# Patient Record
Sex: Male | Born: 1935 | Race: White | Hispanic: No | Marital: Married | Smoking: Former smoker
Health system: Southern US, Community
[De-identification: ages and names within clinical notes are randomized; demographics above are authoritative.]

## PROBLEM LIST (undated history)

## (undated) DIAGNOSIS — N390 Urinary tract infection, site not specified: Secondary | ICD-10-CM

## (undated) DIAGNOSIS — I341 Nonrheumatic mitral (valve) prolapse: Secondary | ICD-10-CM

## (undated) DIAGNOSIS — N183 Chronic kidney disease, stage 3 unspecified: Secondary | ICD-10-CM

## (undated) DIAGNOSIS — I10 Essential (primary) hypertension: Secondary | ICD-10-CM

## (undated) DIAGNOSIS — H548 Legal blindness, as defined in USA: Secondary | ICD-10-CM

## (undated) DIAGNOSIS — I5022 Chronic systolic (congestive) heart failure: Secondary | ICD-10-CM

## (undated) DIAGNOSIS — E119 Type 2 diabetes mellitus without complications: Secondary | ICD-10-CM

## (undated) DIAGNOSIS — J449 Chronic obstructive pulmonary disease, unspecified: Secondary | ICD-10-CM

## (undated) DIAGNOSIS — I639 Cerebral infarction, unspecified: Secondary | ICD-10-CM

## (undated) DIAGNOSIS — G459 Transient cerebral ischemic attack, unspecified: Secondary | ICD-10-CM

## (undated) DIAGNOSIS — E039 Hypothyroidism, unspecified: Secondary | ICD-10-CM

## (undated) DIAGNOSIS — I482 Chronic atrial fibrillation, unspecified: Secondary | ICD-10-CM

## (undated) DIAGNOSIS — G20 Parkinson's disease: Secondary | ICD-10-CM

## (undated) DIAGNOSIS — IMO0001 Reserved for inherently not codable concepts without codable children: Secondary | ICD-10-CM

## (undated) DIAGNOSIS — Z0389 Encounter for observation for other suspected diseases and conditions ruled out: Secondary | ICD-10-CM

## (undated) DIAGNOSIS — G20A1 Parkinson's disease without dyskinesia, without mention of fluctuations: Secondary | ICD-10-CM

## (undated) HISTORY — PX: CARDIAC SURGERY: SHX584

## (undated) HISTORY — PX: CHOLECYSTECTOMY: SHX55

---

## 2004-02-12 ENCOUNTER — Encounter: Payer: Self-pay | Admitting: Internal Medicine

## 2004-02-27 ENCOUNTER — Ambulatory Visit: Payer: Self-pay | Admitting: Anesthesiology

## 2004-03-05 ENCOUNTER — Encounter: Payer: Self-pay | Admitting: Internal Medicine

## 2004-04-04 ENCOUNTER — Encounter: Payer: Self-pay | Admitting: Internal Medicine

## 2004-05-07 ENCOUNTER — Encounter: Payer: Self-pay | Admitting: Internal Medicine

## 2004-05-20 ENCOUNTER — Ambulatory Visit: Payer: Self-pay | Admitting: Internal Medicine

## 2004-05-23 ENCOUNTER — Ambulatory Visit: Payer: Self-pay | Admitting: Internal Medicine

## 2004-06-03 ENCOUNTER — Ambulatory Visit: Payer: Self-pay | Admitting: Specialist

## 2004-06-05 ENCOUNTER — Encounter: Payer: Self-pay | Admitting: Internal Medicine

## 2004-07-03 ENCOUNTER — Encounter: Payer: Self-pay | Admitting: Internal Medicine

## 2004-07-11 ENCOUNTER — Ambulatory Visit: Payer: Self-pay | Admitting: Anesthesiology

## 2004-08-03 ENCOUNTER — Encounter: Payer: Self-pay | Admitting: Internal Medicine

## 2004-09-02 ENCOUNTER — Encounter: Payer: Self-pay | Admitting: Internal Medicine

## 2004-10-03 ENCOUNTER — Encounter: Payer: Self-pay | Admitting: Internal Medicine

## 2004-11-02 ENCOUNTER — Encounter: Payer: Self-pay | Admitting: Internal Medicine

## 2004-12-03 ENCOUNTER — Encounter: Payer: Self-pay | Admitting: Internal Medicine

## 2005-01-03 ENCOUNTER — Encounter: Payer: Self-pay | Admitting: Internal Medicine

## 2005-02-02 ENCOUNTER — Encounter: Payer: Self-pay | Admitting: Internal Medicine

## 2005-03-05 ENCOUNTER — Encounter: Payer: Self-pay | Admitting: Internal Medicine

## 2005-03-14 ENCOUNTER — Other Ambulatory Visit: Payer: Self-pay

## 2005-03-14 ENCOUNTER — Emergency Department: Payer: Self-pay | Admitting: Emergency Medicine

## 2005-03-16 ENCOUNTER — Other Ambulatory Visit: Payer: Self-pay

## 2005-03-16 ENCOUNTER — Inpatient Hospital Stay: Payer: Self-pay | Admitting: Urology

## 2005-04-04 ENCOUNTER — Encounter: Payer: Self-pay | Admitting: Internal Medicine

## 2005-05-05 ENCOUNTER — Encounter: Payer: Self-pay | Admitting: Internal Medicine

## 2005-06-05 ENCOUNTER — Encounter: Payer: Self-pay | Admitting: Internal Medicine

## 2005-07-03 ENCOUNTER — Encounter: Payer: Self-pay | Admitting: Internal Medicine

## 2005-08-03 ENCOUNTER — Encounter: Payer: Self-pay | Admitting: Internal Medicine

## 2005-09-05 ENCOUNTER — Ambulatory Visit: Payer: Self-pay | Admitting: Otolaryngology

## 2005-09-05 ENCOUNTER — Other Ambulatory Visit: Payer: Self-pay

## 2005-09-08 ENCOUNTER — Encounter: Payer: Self-pay | Admitting: Internal Medicine

## 2005-09-10 ENCOUNTER — Ambulatory Visit: Payer: Self-pay | Admitting: Otolaryngology

## 2005-10-07 ENCOUNTER — Encounter: Payer: Self-pay | Admitting: Internal Medicine

## 2005-10-16 ENCOUNTER — Ambulatory Visit: Payer: Self-pay | Admitting: Urology

## 2005-11-02 ENCOUNTER — Encounter: Payer: Self-pay | Admitting: Internal Medicine

## 2005-12-03 ENCOUNTER — Encounter: Payer: Self-pay | Admitting: Internal Medicine

## 2005-12-23 ENCOUNTER — Ambulatory Visit: Payer: Self-pay | Admitting: Anesthesiology

## 2006-01-01 ENCOUNTER — Ambulatory Visit: Payer: Self-pay | Admitting: Anesthesiology

## 2006-01-03 ENCOUNTER — Encounter: Payer: Self-pay | Admitting: Internal Medicine

## 2006-02-03 ENCOUNTER — Ambulatory Visit: Payer: Self-pay | Admitting: Anesthesiology

## 2006-02-24 ENCOUNTER — Encounter: Payer: Self-pay | Admitting: Internal Medicine

## 2006-02-25 ENCOUNTER — Ambulatory Visit: Payer: Self-pay | Admitting: Anesthesiology

## 2006-03-05 ENCOUNTER — Encounter: Payer: Self-pay | Admitting: Internal Medicine

## 2006-03-30 ENCOUNTER — Ambulatory Visit: Payer: Self-pay | Admitting: Anesthesiology

## 2006-04-04 ENCOUNTER — Encounter: Payer: Self-pay | Admitting: Internal Medicine

## 2006-05-18 ENCOUNTER — Encounter: Payer: Self-pay | Admitting: Internal Medicine

## 2006-05-28 ENCOUNTER — Ambulatory Visit: Payer: Self-pay | Admitting: Anesthesiology

## 2006-05-29 ENCOUNTER — Ambulatory Visit: Payer: Self-pay | Admitting: Urology

## 2006-05-29 ENCOUNTER — Other Ambulatory Visit: Payer: Self-pay

## 2006-06-05 ENCOUNTER — Encounter: Payer: Self-pay | Admitting: Internal Medicine

## 2006-06-10 ENCOUNTER — Ambulatory Visit: Payer: Self-pay | Admitting: Urology

## 2006-07-04 ENCOUNTER — Encounter: Payer: Self-pay | Admitting: Internal Medicine

## 2006-07-22 ENCOUNTER — Ambulatory Visit: Payer: Self-pay | Admitting: Anesthesiology

## 2006-08-04 ENCOUNTER — Encounter: Payer: Self-pay | Admitting: Internal Medicine

## 2006-09-07 ENCOUNTER — Encounter: Payer: Self-pay | Admitting: Internal Medicine

## 2006-09-17 ENCOUNTER — Ambulatory Visit: Payer: Self-pay | Admitting: Anesthesiology

## 2006-10-04 ENCOUNTER — Encounter: Payer: Self-pay | Admitting: Internal Medicine

## 2006-11-10 ENCOUNTER — Encounter: Payer: Self-pay | Admitting: Internal Medicine

## 2006-11-17 ENCOUNTER — Ambulatory Visit: Payer: Self-pay | Admitting: Anesthesiology

## 2006-12-04 ENCOUNTER — Encounter: Payer: Self-pay | Admitting: Internal Medicine

## 2007-01-04 ENCOUNTER — Encounter: Payer: Self-pay | Admitting: Internal Medicine

## 2007-01-20 ENCOUNTER — Ambulatory Visit: Payer: Self-pay | Admitting: Anesthesiology

## 2007-05-12 ENCOUNTER — Ambulatory Visit: Payer: Self-pay | Admitting: Anesthesiology

## 2007-07-06 ENCOUNTER — Ambulatory Visit: Payer: Self-pay | Admitting: Anesthesiology

## 2007-09-07 ENCOUNTER — Ambulatory Visit: Payer: Self-pay | Admitting: Anesthesiology

## 2007-10-30 ENCOUNTER — Emergency Department: Payer: Self-pay | Admitting: Unknown Physician Specialty

## 2007-11-18 ENCOUNTER — Ambulatory Visit: Payer: Self-pay | Admitting: Orthopedic Surgery

## 2007-12-21 ENCOUNTER — Ambulatory Visit: Payer: Self-pay | Admitting: Anesthesiology

## 2008-01-11 ENCOUNTER — Ambulatory Visit: Payer: Self-pay | Admitting: Anesthesiology

## 2008-02-23 ENCOUNTER — Ambulatory Visit: Payer: Self-pay | Admitting: Anesthesiology

## 2008-02-25 ENCOUNTER — Ambulatory Visit: Payer: Self-pay | Admitting: Urology

## 2008-03-22 ENCOUNTER — Ambulatory Visit: Payer: Self-pay | Admitting: Anesthesiology

## 2008-04-25 ENCOUNTER — Ambulatory Visit: Payer: Self-pay | Admitting: Anesthesiology

## 2008-05-11 ENCOUNTER — Encounter: Admission: RE | Admit: 2008-05-11 | Discharge: 2008-05-11 | Payer: Self-pay | Admitting: Neurology

## 2008-05-24 ENCOUNTER — Ambulatory Visit: Payer: Self-pay | Admitting: Anesthesiology

## 2008-06-28 ENCOUNTER — Ambulatory Visit: Payer: Self-pay | Admitting: Anesthesiology

## 2008-07-31 ENCOUNTER — Ambulatory Visit: Payer: Self-pay | Admitting: Anesthesiology

## 2008-09-08 ENCOUNTER — Ambulatory Visit: Payer: Self-pay | Admitting: Anesthesiology

## 2008-10-18 ENCOUNTER — Ambulatory Visit: Payer: Self-pay | Admitting: Anesthesiology

## 2008-12-11 ENCOUNTER — Ambulatory Visit: Payer: Self-pay | Admitting: Anesthesiology

## 2008-12-19 ENCOUNTER — Ambulatory Visit: Payer: Self-pay | Admitting: Urology

## 2009-02-15 ENCOUNTER — Ambulatory Visit: Payer: Self-pay | Admitting: Anesthesiology

## 2009-03-16 ENCOUNTER — Ambulatory Visit: Payer: Self-pay | Admitting: Anesthesiology

## 2009-04-04 ENCOUNTER — Ambulatory Visit: Payer: Self-pay | Admitting: Anesthesiology

## 2009-06-05 ENCOUNTER — Ambulatory Visit: Payer: Self-pay | Admitting: Anesthesiology

## 2009-07-09 ENCOUNTER — Ambulatory Visit: Payer: Self-pay | Admitting: Anesthesiology

## 2009-09-11 ENCOUNTER — Ambulatory Visit: Payer: Self-pay | Admitting: Anesthesiology

## 2009-11-29 ENCOUNTER — Ambulatory Visit: Payer: Self-pay | Admitting: Anesthesiology

## 2010-01-16 ENCOUNTER — Ambulatory Visit: Payer: Self-pay | Admitting: Anesthesiology

## 2010-01-29 ENCOUNTER — Ambulatory Visit: Payer: Self-pay | Admitting: Urology

## 2010-02-12 ENCOUNTER — Ambulatory Visit: Payer: Self-pay | Admitting: Urology

## 2010-02-26 ENCOUNTER — Ambulatory Visit: Payer: Self-pay | Admitting: Urology

## 2010-03-11 ENCOUNTER — Ambulatory Visit: Payer: Self-pay | Admitting: Ophthalmology

## 2010-03-26 ENCOUNTER — Ambulatory Visit: Payer: Self-pay | Admitting: Anesthesiology

## 2010-06-26 ENCOUNTER — Ambulatory Visit: Payer: Self-pay | Admitting: Anesthesiology

## 2010-08-26 ENCOUNTER — Ambulatory Visit: Payer: Self-pay | Admitting: Anesthesiology

## 2010-10-28 ENCOUNTER — Ambulatory Visit: Payer: Self-pay | Admitting: Anesthesiology

## 2010-12-27 ENCOUNTER — Ambulatory Visit: Payer: Self-pay | Admitting: Anesthesiology

## 2011-01-17 ENCOUNTER — Ambulatory Visit: Payer: Self-pay | Admitting: Anesthesiology

## 2011-03-18 ENCOUNTER — Ambulatory Visit: Payer: Self-pay | Admitting: Internal Medicine

## 2011-04-18 ENCOUNTER — Ambulatory Visit: Payer: Self-pay | Admitting: Anesthesiology

## 2011-07-23 ENCOUNTER — Ambulatory Visit: Payer: Self-pay | Admitting: Anesthesiology

## 2011-08-15 ENCOUNTER — Inpatient Hospital Stay: Payer: Self-pay | Admitting: Internal Medicine

## 2011-08-15 LAB — CBC WITH DIFFERENTIAL/PLATELET
Basophil %: 0.4 %
MCH: 30.3 pg (ref 26.0–34.0)
Monocyte %: 13.3 %
Neutrophil #: 2.9 10*3/uL (ref 1.4–6.5)
Neutrophil %: 56.2 %
Platelet: 140 10*3/uL — ABNORMAL LOW (ref 150–440)
RBC: 4.52 10*6/uL (ref 4.40–5.90)

## 2011-08-15 LAB — BASIC METABOLIC PANEL
Anion Gap: 11 (ref 7–16)
BUN: 10 mg/dL (ref 7–18)
Calcium, Total: 8.3 mg/dL — ABNORMAL LOW (ref 8.5–10.1)
Co2: 27 mmol/L (ref 21–32)
Creatinine: 0.97 mg/dL (ref 0.60–1.30)
EGFR (African American): >60
EGFR (Non-African Amer.): >60
Osmolality: 283 (ref 275–301)
Potassium: 3.9 mmol/L (ref 3.5–5.1)
Sodium: 137 mmol/L (ref 136–145)

## 2011-08-15 LAB — CK TOTAL AND CKMB (NOT AT ARMC)
CK, Total: 585 U/L — ABNORMAL HIGH (ref 35–232)
CK-MB: 1.3 ng/mL (ref 0.5–3.6)

## 2011-08-15 LAB — TROPONIN I: Troponin-I: <0.02 ng/mL

## 2011-08-15 LAB — PROTIME-INR: INR: 1.3

## 2011-08-16 LAB — TROPONIN I: Troponin-I: <0.02 ng/mL

## 2011-08-16 LAB — COMPREHENSIVE METABOLIC PANEL
Albumin: 2.9 g/dL — ABNORMAL LOW (ref 3.4–5.0)
Anion Gap: 14 (ref 7–16)
BUN: 8 mg/dL (ref 7–18)
Calcium, Total: 8.2 mg/dL — ABNORMAL LOW (ref 8.5–10.1)
Creatinine: 0.76 mg/dL (ref 0.60–1.30)
EGFR (African American): >60
Glucose: 128 mg/dL — ABNORMAL HIGH (ref 65–99)
Osmolality: 276 (ref 275–301)
Potassium: 3.6 mmol/L (ref 3.5–5.1)
Sodium: 138 mmol/L (ref 136–145)
Total Protein: 6.6 g/dL (ref 6.4–8.2)

## 2011-08-16 LAB — CBC WITH DIFFERENTIAL/PLATELET
Basophil %: 0.4 %
Eosinophil #: 0 10*3/uL (ref 0.0–0.7)
Eosinophil %: 0.4 %
Lymphocyte %: 21.7 %
MCH: 30.3 pg (ref 26.0–34.0)
MCHC: 33.1 g/dL (ref 32.0–36.0)
Monocyte #: 0.7 x10 3/mm (ref 0.2–1.0)
Monocyte %: 12.3 %
Neutrophil #: 3.6 10*3/uL (ref 1.4–6.5)
Neutrophil %: 65.2 %

## 2011-08-16 LAB — CK TOTAL AND CKMB (NOT AT ARMC): CK-MB: 0.8 ng/mL (ref 0.5–3.6)

## 2011-08-16 LAB — PROTIME-INR: INR: 1.4

## 2011-08-17 LAB — BASIC METABOLIC PANEL
BUN: 13 mg/dL (ref 7–18)
Calcium, Total: 8.1 mg/dL — ABNORMAL LOW (ref 8.5–10.1)
Chloride: 101 mmol/L (ref 98–107)
Creatinine: 0.93 mg/dL (ref 0.60–1.30)
EGFR (African American): >60
EGFR (Non-African Amer.): >60
Glucose: 64 mg/dL — ABNORMAL LOW (ref 65–99)
Osmolality: 272 (ref 275–301)
Potassium: 3.2 mmol/L — ABNORMAL LOW (ref 3.5–5.1)
Sodium: 137 mmol/L (ref 136–145)

## 2011-08-17 LAB — PROTIME-INR
INR: 1.7
Prothrombin Time: 20.2 secs — ABNORMAL HIGH (ref 11.5–14.7)

## 2011-08-18 LAB — PROTIME-INR
INR: 1.8
Prothrombin Time: 21.3 secs — ABNORMAL HIGH (ref 11.5–14.7)

## 2011-08-18 LAB — CBC WITH DIFFERENTIAL/PLATELET
Eosinophil #: 0.1 10*3/uL (ref 0.0–0.7)
Eosinophil %: 2.7 %
Lymphocyte #: 1.3 10*3/uL (ref 1.0–3.6)
MCH: 30.4 pg (ref 26.0–34.0)
MCHC: 33.8 g/dL (ref 32.0–36.0)
Monocyte %: 15.1 %
Neutrophil #: 2 10*3/uL (ref 1.4–6.5)
Platelet: 159 10*3/uL (ref 150–440)

## 2011-08-18 LAB — BASIC METABOLIC PANEL
Anion Gap: 10 (ref 7–16)
BUN: 13 mg/dL (ref 7–18)
Calcium, Total: 8.2 mg/dL — ABNORMAL LOW (ref 8.5–10.1)
EGFR (African American): >60
Osmolality: 280 (ref 275–301)
Potassium: 3.4 mmol/L — ABNORMAL LOW (ref 3.5–5.1)
Sodium: 138 mmol/L (ref 136–145)

## 2011-08-21 LAB — CULTURE, BLOOD (SINGLE)

## 2011-10-10 ENCOUNTER — Ambulatory Visit: Payer: Self-pay | Admitting: Anesthesiology

## 2011-10-29 ENCOUNTER — Emergency Department: Payer: Self-pay | Admitting: *Deleted

## 2011-10-29 LAB — URINALYSIS, COMPLETE
Bacteria: NONE SEEN
Bilirubin,UR: NEGATIVE
Blood: NEGATIVE
Glucose,UR: >=500 mg/dL (ref 0–75)
Ph: 6 (ref 4.5–8.0)
Specific Gravity: 1.031 (ref 1.003–1.030)
WBC UR: 4 /HPF (ref 0–5)

## 2011-10-29 LAB — PROTIME-INR
INR: 1.3
Prothrombin Time: 16.8 secs — ABNORMAL HIGH (ref 11.5–14.7)

## 2011-11-13 ENCOUNTER — Ambulatory Visit: Payer: Self-pay | Admitting: Urology

## 2011-12-22 ENCOUNTER — Ambulatory Visit: Payer: Self-pay | Admitting: Anesthesiology

## 2012-01-30 ENCOUNTER — Ambulatory Visit: Payer: Self-pay | Admitting: General Practice

## 2012-02-24 ENCOUNTER — Ambulatory Visit: Payer: Self-pay | Admitting: Anesthesiology

## 2012-04-22 ENCOUNTER — Ambulatory Visit: Payer: Self-pay | Admitting: Anesthesiology

## 2012-06-20 ENCOUNTER — Observation Stay: Payer: Self-pay | Admitting: Internal Medicine

## 2012-06-20 LAB — COMPREHENSIVE METABOLIC PANEL
Chloride: 105 mmol/L (ref 98–107)
Co2: 27 mmol/L (ref 21–32)
EGFR (Non-African Amer.): >60
Glucose: 139 mg/dL — ABNORMAL HIGH (ref 65–99)
Osmolality: 280 (ref 275–301)
Potassium: 3.4 mmol/L — ABNORMAL LOW (ref 3.5–5.1)
Total Protein: 7.2 g/dL (ref 6.4–8.2)

## 2012-06-20 LAB — URINALYSIS, COMPLETE
Bacteria: NONE SEEN
Glucose,UR: NEGATIVE mg/dL (ref 0–75)
Nitrite: NEGATIVE
Ph: 5 (ref 4.5–8.0)
Protein: 100
RBC,UR: <1 /HPF (ref 0–5)
Specific Gravity: 1.009 (ref 1.003–1.030)
WBC UR: 5 /HPF (ref 0–5)

## 2012-06-20 LAB — CBC
HCT: 43.9 % (ref 40.0–52.0)
HGB: 15.1 g/dL (ref 13.0–18.0)
MCH: 31.3 pg (ref 26.0–34.0)
MCHC: 34.3 g/dL (ref 32.0–36.0)
MCV: 91 fL (ref 80–100)
Platelet: 153 10*3/uL (ref 150–440)

## 2012-06-20 LAB — TROPONIN I: Troponin-I: <0.02 ng/mL

## 2012-06-20 LAB — PROTIME-INR
INR: 1.2
Prothrombin Time: 15.4 secs — ABNORMAL HIGH (ref 11.5–14.7)

## 2012-06-20 LAB — CK TOTAL AND CKMB (NOT AT ARMC): CK-MB: <0.5 ng/mL — ABNORMAL LOW (ref 0.5–3.6)

## 2012-06-21 LAB — BASIC METABOLIC PANEL
Anion Gap: 9 (ref 7–16)
Calcium, Total: 8.3 mg/dL — ABNORMAL LOW (ref 8.5–10.1)
Creatinine: 1.12 mg/dL (ref 0.60–1.30)
EGFR (African American): >60
Osmolality: 284 (ref 275–301)
Potassium: 3.7 mmol/L (ref 3.5–5.1)

## 2012-06-21 LAB — CBC WITH DIFFERENTIAL/PLATELET
Basophil #: 0.1 10*3/uL (ref 0.0–0.1)
HCT: 40.3 % (ref 40.0–52.0)
MCH: 30.5 pg (ref 26.0–34.0)
MCHC: 33.3 g/dL (ref 32.0–36.0)
MCV: 92 fL (ref 80–100)
Monocyte %: 14.5 %
Neutrophil #: 5.2 10*3/uL (ref 1.4–6.5)
RBC: 4.4 10*6/uL (ref 4.40–5.90)
WBC: 8.4 10*3/uL (ref 3.8–10.6)

## 2012-06-21 LAB — PROTIME-INR
INR: 1.3
Prothrombin Time: 16.2 secs — ABNORMAL HIGH (ref 11.5–14.7)

## 2012-06-22 ENCOUNTER — Encounter: Payer: Self-pay | Admitting: Internal Medicine

## 2012-06-22 LAB — BASIC METABOLIC PANEL
Anion Gap: 9 (ref 7–16)
Chloride: 103 mmol/L (ref 98–107)
Co2: 25 mmol/L (ref 21–32)
Creatinine: 0.89 mg/dL (ref 0.60–1.30)
Osmolality: 281 (ref 275–301)
Potassium: 3.8 mmol/L (ref 3.5–5.1)

## 2012-06-22 LAB — CBC WITH DIFFERENTIAL/PLATELET
Basophil #: 0 10*3/uL (ref 0.0–0.1)
Basophil %: 0.7 %
Eosinophil #: 0.3 10*3/uL (ref 0.0–0.7)
HCT: 39 % — ABNORMAL LOW (ref 40.0–52.0)
HGB: 13.3 g/dL (ref 13.0–18.0)
Monocyte %: 13 %
Neutrophil #: 4.1 10*3/uL (ref 1.4–6.5)
WBC: 7 10*3/uL (ref 3.8–10.6)

## 2012-06-22 LAB — PROTIME-INR: INR: 1.6

## 2012-06-25 LAB — BASIC METABOLIC PANEL
Anion Gap: 6 — ABNORMAL LOW (ref 7–16)
BUN: 17 mg/dL (ref 7–18)
Chloride: 107 mmol/L (ref 98–107)
Glucose: 142 mg/dL — ABNORMAL HIGH (ref 65–99)
Osmolality: 283 (ref 275–301)
Sodium: 140 mmol/L (ref 136–145)

## 2012-06-25 LAB — PROTIME-INR: INR: 2.3

## 2012-07-01 LAB — PROTIME-INR
INR: 4.1
Prothrombin Time: 37.8 secs — ABNORMAL HIGH (ref 11.5–14.7)

## 2012-07-02 LAB — PROTIME-INR: Prothrombin Time: 37.9 secs — ABNORMAL HIGH (ref 11.5–14.7)

## 2012-07-03 ENCOUNTER — Encounter: Payer: Self-pay | Admitting: Internal Medicine

## 2012-07-05 LAB — PROTIME-INR
INR: 2
Prothrombin Time: 22 secs — ABNORMAL HIGH (ref 11.5–14.7)

## 2012-07-11 LAB — URINALYSIS, COMPLETE
Bilirubin,UR: NEGATIVE
Glucose,UR: NEGATIVE mg/dL (ref 0–75)
Ketone: NEGATIVE
Nitrite: NEGATIVE
Ph: 5 (ref 4.5–8.0)
Specific Gravity: 1.028 (ref 1.003–1.030)
Squamous Epithelial: 2
WBC UR: 17 /HPF (ref 0–5)

## 2012-07-13 ENCOUNTER — Ambulatory Visit: Payer: Self-pay | Admitting: Urology

## 2012-07-28 ENCOUNTER — Emergency Department: Payer: Self-pay | Admitting: Emergency Medicine

## 2012-07-28 LAB — URINALYSIS, COMPLETE
Bacteria: NONE SEEN
Bilirubin,UR: NEGATIVE
Ketone: NEGATIVE
Nitrite: NEGATIVE
Ph: 6 (ref 4.5–8.0)
Specific Gravity: 1.016 (ref 1.003–1.030)
Squamous Epithelial: <1
WBC UR: 4 /HPF (ref 0–5)

## 2012-07-28 LAB — PROTIME-INR
INR: 1.3
Prothrombin Time: 16 secs — ABNORMAL HIGH (ref 11.5–14.7)

## 2012-07-28 LAB — CBC WITH DIFFERENTIAL/PLATELET
Basophil %: 1.1 %
Eosinophil %: 1.4 %
HGB: 12.6 g/dL — ABNORMAL LOW (ref 13.0–18.0)
Lymphocyte %: 17.4 %
Monocyte #: 0.7 x10 3/mm (ref 0.2–1.0)
Monocyte %: 10.1 %
Neutrophil #: 5.1 10*3/uL (ref 1.4–6.5)
Neutrophil %: 70 %
Platelet: 155 10*3/uL (ref 150–440)
RBC: 4.16 10*6/uL — ABNORMAL LOW (ref 4.40–5.90)
WBC: 7.2 10*3/uL (ref 3.8–10.6)

## 2012-07-28 LAB — BASIC METABOLIC PANEL
Anion Gap: 3 — ABNORMAL LOW (ref 7–16)
BUN: 13 mg/dL (ref 7–18)
Chloride: 106 mmol/L (ref 98–107)
Co2: 27 mmol/L (ref 21–32)
Creatinine: 0.88 mg/dL (ref 0.60–1.30)
EGFR (African American): >60
EGFR (Non-African Amer.): >60
Osmolality: 282 (ref 275–301)
Sodium: 136 mmol/L (ref 136–145)

## 2012-07-29 ENCOUNTER — Ambulatory Visit: Payer: Self-pay | Admitting: Anesthesiology

## 2012-08-03 ENCOUNTER — Encounter: Payer: Self-pay | Admitting: Internal Medicine

## 2012-08-04 ENCOUNTER — Emergency Department: Payer: Self-pay | Admitting: Emergency Medicine

## 2012-08-04 LAB — URINALYSIS, COMPLETE
Blood: NEGATIVE
Glucose,UR: >=500 mg/dL (ref 0–75)
Ketone: NEGATIVE
Nitrite: NEGATIVE
Ph: 5 (ref 4.5–8.0)
RBC,UR: 3 /HPF (ref 0–5)
Specific Gravity: 1.025 (ref 1.003–1.030)
WBC UR: 10 /HPF (ref 0–5)

## 2012-08-04 LAB — PROTIME-INR
INR: 1.2
Prothrombin Time: 15 secs — ABNORMAL HIGH (ref 11.5–14.7)

## 2012-08-04 LAB — CBC
HGB: 13.9 g/dL (ref 13.0–18.0)
MCH: 29.9 pg (ref 26.0–34.0)
MCHC: 33.1 g/dL (ref 32.0–36.0)
MCV: 91 fL (ref 80–100)
Platelet: 218 10*3/uL (ref 150–440)
RBC: 4.66 10*6/uL (ref 4.40–5.90)

## 2012-08-04 LAB — COMPREHENSIVE METABOLIC PANEL
Albumin: 3.5 g/dL (ref 3.4–5.0)
Alkaline Phosphatase: 65 U/L (ref 50–136)
BUN: 14 mg/dL (ref 7–18)
Bilirubin,Total: 0.9 mg/dL (ref 0.2–1.0)
Chloride: 104 mmol/L (ref 98–107)
Creatinine: 0.98 mg/dL (ref 0.60–1.30)
Glucose: 328 mg/dL — ABNORMAL HIGH (ref 65–99)
Osmolality: 289 (ref 275–301)
SGPT (ALT): 34 U/L (ref 12–78)
Sodium: 138 mmol/L (ref 136–145)
Total Protein: 7.3 g/dL (ref 6.4–8.2)

## 2012-08-04 LAB — LIPASE, BLOOD: Lipase: 131 U/L (ref 73–393)

## 2012-08-04 LAB — PRO B NATRIURETIC PEPTIDE: B-Type Natriuretic Peptide: 1858 pg/mL — ABNORMAL HIGH (ref 0–450)

## 2012-08-04 LAB — TROPONIN I: Troponin-I: <0.02 ng/mL

## 2012-09-08 ENCOUNTER — Ambulatory Visit: Payer: Self-pay | Admitting: Gastroenterology

## 2012-09-10 LAB — PATHOLOGY REPORT

## 2012-09-14 ENCOUNTER — Ambulatory Visit: Payer: Self-pay | Admitting: Anesthesiology

## 2012-10-27 ENCOUNTER — Observation Stay: Payer: Self-pay | Admitting: Internal Medicine

## 2012-10-27 LAB — URINALYSIS, COMPLETE
Bilirubin,UR: NEGATIVE
Blood: NEGATIVE
Glucose,UR: >=500 mg/dL (ref 0–75)
Hyaline Cast: 9
Ketone: NEGATIVE
Ph: 5 (ref 4.5–8.0)
RBC,UR: 5 /HPF (ref 0–5)
Specific Gravity: 1.024 (ref 1.003–1.030)
Squamous Epithelial: 3

## 2012-10-27 LAB — BASIC METABOLIC PANEL
Anion Gap: 8 (ref 7–16)
Chloride: 106 mmol/L (ref 98–107)
Co2: 25 mmol/L (ref 21–32)
Creatinine: 1.3 mg/dL (ref 0.60–1.30)
EGFR (African American): 57 — ABNORMAL LOW
Glucose: 266 mg/dL — ABNORMAL HIGH (ref 65–99)
Osmolality: 287 (ref 275–301)
Potassium: 3.9 mmol/L (ref 3.5–5.1)

## 2012-10-27 LAB — CBC
HCT: 43.3 % (ref 40.0–52.0)
HGB: 14.7 g/dL (ref 13.0–18.0)
MCHC: 33.9 g/dL (ref 32.0–36.0)
MCV: 90 fL (ref 80–100)
RBC: 4.79 10*6/uL (ref 4.40–5.90)
WBC: 9.4 10*3/uL (ref 3.8–10.6)

## 2012-10-27 LAB — PROTIME-INR: INR: 1.2

## 2012-10-29 LAB — PROTIME-INR: INR: 1.2

## 2012-11-02 ENCOUNTER — Ambulatory Visit: Payer: Self-pay | Admitting: Anesthesiology

## 2012-12-01 ENCOUNTER — Emergency Department: Payer: Self-pay | Admitting: Emergency Medicine

## 2012-12-02 LAB — TROPONIN I: Troponin-I: <0.02 ng/mL

## 2012-12-02 LAB — CBC WITH DIFFERENTIAL/PLATELET
Basophil %: 0.5 %
Eosinophil %: 2.1 %
HGB: 13.7 g/dL (ref 13.0–18.0)
Lymphocyte %: 17.3 %
MCH: 30.8 pg (ref 26.0–34.0)
MCHC: 34.3 g/dL (ref 32.0–36.0)
MCV: 90 fL (ref 80–100)
Monocyte #: 1 x10 3/mm (ref 0.2–1.0)
Neutrophil #: 8 10*3/uL — ABNORMAL HIGH (ref 1.4–6.5)
Neutrophil %: 71 %
Platelet: 184 10*3/uL (ref 150–440)
RDW: 13.9 % (ref 11.5–14.5)
WBC: 11.3 10*3/uL — ABNORMAL HIGH (ref 3.8–10.6)

## 2012-12-02 LAB — COMPREHENSIVE METABOLIC PANEL
Chloride: 105 mmol/L (ref 98–107)
EGFR (African American): >60
EGFR (Non-African Amer.): >60
Osmolality: 277 (ref 275–301)
Sodium: 138 mmol/L (ref 136–145)

## 2012-12-02 LAB — PROTIME-INR: INR: 1.9

## 2012-12-02 LAB — CK TOTAL AND CKMB (NOT AT ARMC): CK, Total: 64 U/L (ref 35–232)

## 2012-12-04 ENCOUNTER — Other Ambulatory Visit: Payer: Self-pay | Admitting: Family Medicine

## 2012-12-10 ENCOUNTER — Emergency Department: Payer: Self-pay | Admitting: Emergency Medicine

## 2012-12-13 ENCOUNTER — Ambulatory Visit: Payer: Self-pay | Admitting: Physician Assistant

## 2013-01-11 ENCOUNTER — Ambulatory Visit: Payer: Self-pay | Admitting: Anesthesiology

## 2013-04-06 ENCOUNTER — Emergency Department: Payer: Self-pay | Admitting: Emergency Medicine

## 2013-04-06 LAB — COMPREHENSIVE METABOLIC PANEL
Albumin: 3.3 g/dL — ABNORMAL LOW (ref 3.4–5.0)
Alkaline Phosphatase: 68 U/L
Anion Gap: 9 (ref 7–16)
BUN: 21 mg/dL — ABNORMAL HIGH (ref 7–18)
Co2: 24 mmol/L (ref 21–32)
EGFR (African American): >60
EGFR (Non-African Amer.): >60
Glucose: 141 mg/dL — ABNORMAL HIGH (ref 65–99)
Osmolality: 285 (ref 275–301)
Potassium: 4.3 mmol/L (ref 3.5–5.1)
Total Protein: 6.9 g/dL (ref 6.4–8.2)

## 2013-04-06 LAB — CBC
HCT: 39.1 % — ABNORMAL LOW (ref 40.0–52.0)
HGB: 13.2 g/dL (ref 13.0–18.0)
Platelet: 155 10*3/uL (ref 150–440)
RDW: 14.1 % (ref 11.5–14.5)
WBC: 7.4 10*3/uL (ref 3.8–10.6)

## 2013-04-14 ENCOUNTER — Ambulatory Visit: Payer: Self-pay | Admitting: Anesthesiology

## 2013-07-04 ENCOUNTER — Emergency Department: Payer: Self-pay | Admitting: Emergency Medicine

## 2013-07-04 ENCOUNTER — Ambulatory Visit: Payer: Self-pay | Admitting: Anesthesiology

## 2013-07-04 LAB — COMPREHENSIVE METABOLIC PANEL
ALBUMIN: 3 g/dL — AB (ref 3.4–5.0)
ALK PHOS: 78 U/L
ALT: 23 U/L (ref 12–78)
Anion Gap: 4 — ABNORMAL LOW (ref 7–16)
BUN: 9 mg/dL (ref 7–18)
Bilirubin,Total: 0.8 mg/dL (ref 0.2–1.0)
CHLORIDE: 108 mmol/L — AB (ref 98–107)
Calcium, Total: 7.9 mg/dL — ABNORMAL LOW (ref 8.5–10.1)
Co2: 28 mmol/L (ref 21–32)
Creatinine: 0.97 mg/dL (ref 0.60–1.30)
EGFR (African American): >60
Glucose: 95 mg/dL (ref 65–99)
OSMOLALITY: 278 (ref 275–301)
POTASSIUM: 3.2 mmol/L — AB (ref 3.5–5.1)
SGOT(AST): 25 U/L (ref 15–37)
Sodium: 140 mmol/L (ref 136–145)
TOTAL PROTEIN: 6.7 g/dL (ref 6.4–8.2)

## 2013-07-04 LAB — CBC
HCT: 44.1 % (ref 40.0–52.0)
HGB: 14.6 g/dL (ref 13.0–18.0)
MCH: 29.7 pg (ref 26.0–34.0)
MCHC: 33.2 g/dL (ref 32.0–36.0)
MCV: 89 fL (ref 80–100)
Platelet: 135 10*3/uL — ABNORMAL LOW (ref 150–440)
RBC: 4.93 10*6/uL (ref 4.40–5.90)
RDW: 14.1 % (ref 11.5–14.5)
WBC: 6.9 10*3/uL (ref 3.8–10.6)

## 2013-07-20 ENCOUNTER — Ambulatory Visit: Payer: Self-pay | Admitting: Internal Medicine

## 2013-07-23 ENCOUNTER — Inpatient Hospital Stay: Payer: Self-pay | Admitting: Internal Medicine

## 2013-07-23 LAB — BASIC METABOLIC PANEL
Anion Gap: 6 — ABNORMAL LOW (ref 7–16)
BUN: 17 mg/dL (ref 7–18)
CHLORIDE: 105 mmol/L (ref 98–107)
CREATININE: 1.13 mg/dL (ref 0.60–1.30)
Calcium, Total: 8.4 mg/dL — ABNORMAL LOW (ref 8.5–10.1)
Co2: 26 mmol/L (ref 21–32)
EGFR (African American): >60
EGFR (Non-African Amer.): 58 — ABNORMAL LOW
Glucose: 228 mg/dL — ABNORMAL HIGH (ref 65–99)
Osmolality: 283 (ref 275–301)
Potassium: 4.4 mmol/L (ref 3.5–5.1)
Sodium: 137 mmol/L (ref 136–145)

## 2013-07-23 LAB — PROTIME-INR
INR: 2.1
Prothrombin Time: 23.4 secs — ABNORMAL HIGH (ref 11.5–14.7)

## 2013-07-23 LAB — URINALYSIS, COMPLETE
BACTERIA: NONE SEEN
Bilirubin,UR: NEGATIVE
Glucose,UR: NEGATIVE mg/dL (ref 0–75)
Ketone: NEGATIVE
Nitrite: NEGATIVE
PH: 5 (ref 4.5–8.0)
PROTEIN: NEGATIVE
Specific Gravity: 1.011 (ref 1.003–1.030)
Squamous Epithelial: NONE SEEN
WBC UR: 5 /HPF (ref 0–5)

## 2013-07-23 LAB — CBC
HCT: 40.2 % (ref 40.0–52.0)
HGB: 13.5 g/dL (ref 13.0–18.0)
MCH: 30.4 pg (ref 26.0–34.0)
MCHC: 33.5 g/dL (ref 32.0–36.0)
MCV: 91 fL (ref 80–100)
Platelet: 152 10*3/uL (ref 150–440)
RBC: 4.43 10*6/uL (ref 4.40–5.90)
RDW: 14.4 % (ref 11.5–14.5)
WBC: 6.4 10*3/uL (ref 3.8–10.6)

## 2013-07-23 LAB — PRO B NATRIURETIC PEPTIDE: B-TYPE NATIURETIC PEPTID: 1149 pg/mL — AB (ref 0–450)

## 2013-07-23 LAB — TROPONIN I: Troponin-I: <0.02 ng/mL

## 2013-07-24 LAB — CBC WITH DIFFERENTIAL/PLATELET
Basophil #: 0.1 10*3/uL (ref 0.0–0.1)
Basophil %: 0.9 %
EOS ABS: 0.7 10*3/uL (ref 0.0–0.7)
EOS PCT: 9.1 %
HCT: 38.8 % — ABNORMAL LOW (ref 40.0–52.0)
HGB: 13 g/dL (ref 13.0–18.0)
LYMPHS ABS: 1.8 10*3/uL (ref 1.0–3.6)
LYMPHS PCT: 24.3 %
MCH: 30.3 pg (ref 26.0–34.0)
MCHC: 33.6 g/dL (ref 32.0–36.0)
MCV: 90 fL (ref 80–100)
Monocyte #: 0.8 x10 3/mm (ref 0.2–1.0)
Monocyte %: 11 %
Neutrophil #: 4.1 10*3/uL (ref 1.4–6.5)
Neutrophil %: 54.7 %
PLATELETS: 150 10*3/uL (ref 150–440)
RBC: 4.29 10*6/uL — AB (ref 4.40–5.90)
RDW: 14.3 % (ref 11.5–14.5)
WBC: 7.5 10*3/uL (ref 3.8–10.6)

## 2013-07-24 LAB — BASIC METABOLIC PANEL
ANION GAP: 2 — AB (ref 7–16)
BUN: 16 mg/dL (ref 7–18)
CALCIUM: 8.3 mg/dL — AB (ref 8.5–10.1)
CHLORIDE: 102 mmol/L (ref 98–107)
CO2: 31 mmol/L (ref 21–32)
Creatinine: 1.01 mg/dL (ref 0.60–1.30)
EGFR (Non-African Amer.): >60
GLUCOSE: 161 mg/dL — AB (ref 65–99)
Osmolality: 275 (ref 275–301)
POTASSIUM: 4 mmol/L (ref 3.5–5.1)
Sodium: 135 mmol/L — ABNORMAL LOW (ref 136–145)

## 2013-07-24 LAB — PROTIME-INR
INR: 2.7
Prothrombin Time: 27.6 secs — ABNORMAL HIGH (ref 11.5–14.7)

## 2013-07-25 LAB — BASIC METABOLIC PANEL
Anion Gap: 2 — ABNORMAL LOW (ref 7–16)
BUN: 18 mg/dL (ref 7–18)
CHLORIDE: 101 mmol/L (ref 98–107)
Calcium, Total: 8.7 mg/dL (ref 8.5–10.1)
Co2: 32 mmol/L (ref 21–32)
Creatinine: 1.12 mg/dL (ref 0.60–1.30)
EGFR (Non-African Amer.): 58 — ABNORMAL LOW
Glucose: 147 mg/dL — ABNORMAL HIGH (ref 65–99)
Osmolality: 275 (ref 275–301)
Potassium: 3.8 mmol/L (ref 3.5–5.1)
Sodium: 135 mmol/L — ABNORMAL LOW (ref 136–145)

## 2013-07-25 LAB — PROTIME-INR
INR: 2.4
PROTHROMBIN TIME: 25.7 s — AB (ref 11.5–14.7)

## 2013-07-25 LAB — CBC WITH DIFFERENTIAL/PLATELET
Basophil #: 0.1 10*3/uL (ref 0.0–0.1)
Basophil %: 0.7 %
Eosinophil #: 0.5 10*3/uL (ref 0.0–0.7)
Eosinophil %: 7.1 %
HCT: 41.8 % (ref 40.0–52.0)
HGB: 14.2 g/dL (ref 13.0–18.0)
LYMPHS ABS: 1.5 10*3/uL (ref 1.0–3.6)
Lymphocyte %: 19.5 %
MCH: 30.6 pg (ref 26.0–34.0)
MCHC: 33.9 g/dL (ref 32.0–36.0)
MCV: 90 fL (ref 80–100)
MONO ABS: 0.7 x10 3/mm (ref 0.2–1.0)
Monocyte %: 9.2 %
Neutrophil #: 4.8 10*3/uL (ref 1.4–6.5)
Neutrophil %: 63.5 %
PLATELETS: 165 10*3/uL (ref 150–440)
RBC: 4.64 10*6/uL (ref 4.40–5.90)
RDW: 14.5 % (ref 11.5–14.5)
WBC: 7.5 10*3/uL (ref 3.8–10.6)

## 2013-07-26 LAB — BASIC METABOLIC PANEL
Anion Gap: 8 (ref 7–16)
BUN: 21 mg/dL — AB (ref 7–18)
CALCIUM: 8.6 mg/dL (ref 8.5–10.1)
CREATININE: 1.14 mg/dL (ref 0.60–1.30)
Chloride: 101 mmol/L (ref 98–107)
Co2: 28 mmol/L (ref 21–32)
EGFR (Non-African Amer.): 57 — ABNORMAL LOW
Glucose: 176 mg/dL — ABNORMAL HIGH (ref 65–99)
Osmolality: 281 (ref 275–301)
POTASSIUM: 3.7 mmol/L (ref 3.5–5.1)
Sodium: 137 mmol/L (ref 136–145)

## 2013-07-26 LAB — CBC WITH DIFFERENTIAL/PLATELET
BASOS ABS: 0.1 10*3/uL (ref 0.0–0.1)
BASOS PCT: 0.7 %
Eosinophil #: 0.5 10*3/uL (ref 0.0–0.7)
Eosinophil %: 6.6 %
HCT: 41.1 % (ref 40.0–52.0)
HGB: 13.9 g/dL (ref 13.0–18.0)
LYMPHS PCT: 17.1 %
Lymphocyte #: 1.4 10*3/uL (ref 1.0–3.6)
MCH: 30.2 pg (ref 26.0–34.0)
MCHC: 33.8 g/dL (ref 32.0–36.0)
MCV: 89 fL (ref 80–100)
Monocyte #: 0.8 x10 3/mm (ref 0.2–1.0)
Monocyte %: 10.1 %
NEUTROS ABS: 5.2 10*3/uL (ref 1.4–6.5)
NEUTROS PCT: 65.5 %
PLATELETS: 157 10*3/uL (ref 150–440)
RBC: 4.6 10*6/uL (ref 4.40–5.90)
RDW: 14 % (ref 11.5–14.5)
WBC: 8 10*3/uL (ref 3.8–10.6)

## 2013-07-26 LAB — PROTIME-INR
INR: 2.7
Prothrombin Time: 28.1 secs — ABNORMAL HIGH (ref 11.5–14.7)

## 2013-10-15 ENCOUNTER — Other Ambulatory Visit: Payer: Self-pay | Admitting: Family Medicine

## 2013-10-15 LAB — PROTIME-INR
INR: 1.1
Prothrombin Time: 14.4 secs (ref 11.5–14.7)

## 2013-10-18 ENCOUNTER — Ambulatory Visit: Payer: Self-pay | Admitting: Anesthesiology

## 2013-12-26 ENCOUNTER — Ambulatory Visit: Payer: Self-pay | Admitting: Anesthesiology

## 2014-02-22 ENCOUNTER — Ambulatory Visit: Payer: Self-pay | Admitting: Orthopedic Surgery

## 2014-02-22 LAB — CBC WITH DIFFERENTIAL/PLATELET
BASOS ABS: 0.1 10*3/uL (ref 0.0–0.1)
BASOS PCT: 0.8 %
EOS PCT: 4.2 %
Eosinophil #: 0.4 10*3/uL (ref 0.0–0.7)
HCT: 40.3 % (ref 40.0–52.0)
HGB: 12.9 g/dL — ABNORMAL LOW (ref 13.0–18.0)
Lymphocyte #: 1.5 10*3/uL (ref 1.0–3.6)
Lymphocyte %: 17.9 %
MCH: 29.7 pg (ref 26.0–34.0)
MCHC: 31.9 g/dL — ABNORMAL LOW (ref 32.0–36.0)
MCV: 93 fL (ref 80–100)
MONO ABS: 0.8 x10 3/mm (ref 0.2–1.0)
Monocyte %: 8.9 %
Neutrophil #: 5.8 10*3/uL (ref 1.4–6.5)
Neutrophil %: 68.2 %
Platelet: 168 10*3/uL (ref 150–440)
RBC: 4.33 10*6/uL — ABNORMAL LOW (ref 4.40–5.90)
RDW: 13.7 % (ref 11.5–14.5)
WBC: 8.6 10*3/uL (ref 3.8–10.6)

## 2014-02-22 LAB — PROTIME-INR
INR: 1.1
PROTHROMBIN TIME: 14.5 s (ref 11.5–14.7)

## 2014-02-22 LAB — BASIC METABOLIC PANEL
Anion Gap: 8 (ref 7–16)
BUN: 11 mg/dL (ref 7–18)
CHLORIDE: 105 mmol/L (ref 98–107)
CO2: 27 mmol/L (ref 21–32)
Calcium, Total: 8.3 mg/dL — ABNORMAL LOW (ref 8.5–10.1)
Creatinine: 0.88 mg/dL (ref 0.60–1.30)
EGFR (African American): >60
Glucose: 181 mg/dL — ABNORMAL HIGH (ref 65–99)
OSMOLALITY: 283 (ref 275–301)
POTASSIUM: 3 mmol/L — AB (ref 3.5–5.1)
Sodium: 140 mmol/L (ref 136–145)

## 2014-02-22 LAB — APTT: Activated PTT: 32 secs (ref 23.6–35.9)

## 2014-03-14 ENCOUNTER — Observation Stay: Payer: Self-pay | Admitting: Internal Medicine

## 2014-03-14 LAB — APTT: ACTIVATED PTT: 29.4 s (ref 23.6–35.9)

## 2014-03-14 LAB — CBC WITH DIFFERENTIAL/PLATELET
Basophil #: 0.1 10*3/uL (ref 0.0–0.1)
Basophil %: 0.8 %
Eosinophil #: 0.2 10*3/uL (ref 0.0–0.7)
Eosinophil %: 1.8 %
HCT: 46.9 % (ref 40.0–52.0)
HGB: 15.4 g/dL (ref 13.0–18.0)
Lymphocyte #: 2.1 10*3/uL (ref 1.0–3.6)
Lymphocyte %: 19 %
MCH: 30.3 pg (ref 26.0–34.0)
MCHC: 32.8 g/dL (ref 32.0–36.0)
MCV: 92 fL (ref 80–100)
MONO ABS: 0.8 x10 3/mm (ref 0.2–1.0)
Monocyte %: 7.1 %
NEUTROS PCT: 71.3 %
Neutrophil #: 8 10*3/uL — ABNORMAL HIGH (ref 1.4–6.5)
Platelet: 213 10*3/uL (ref 150–440)
RBC: 5.08 10*6/uL (ref 4.40–5.90)
RDW: 13.6 % (ref 11.5–14.5)
WBC: 11.2 10*3/uL — ABNORMAL HIGH (ref 3.8–10.6)

## 2014-03-14 LAB — COMPREHENSIVE METABOLIC PANEL
ALT: 35 U/L
AST: 34 U/L (ref 15–37)
Albumin: 3.6 g/dL (ref 3.4–5.0)
Alkaline Phosphatase: 80 U/L
Anion Gap: 11 (ref 7–16)
BUN: 14 mg/dL (ref 7–18)
Bilirubin,Total: 1.3 mg/dL — ABNORMAL HIGH (ref 0.2–1.0)
CALCIUM: 8.7 mg/dL (ref 8.5–10.1)
Chloride: 101 mmol/L (ref 98–107)
Co2: 27 mmol/L (ref 21–32)
Creatinine: 1.09 mg/dL (ref 0.60–1.30)
EGFR (Non-African Amer.): >60
Glucose: 174 mg/dL — ABNORMAL HIGH (ref 65–99)
Osmolality: 282 (ref 275–301)
Potassium: 3.1 mmol/L — ABNORMAL LOW (ref 3.5–5.1)
Sodium: 139 mmol/L (ref 136–145)
Total Protein: 8 g/dL (ref 6.4–8.2)

## 2014-03-14 LAB — CK TOTAL AND CKMB (NOT AT ARMC)
CK, TOTAL: 46 U/L
CK-MB: 1.3 ng/mL (ref 0.5–3.6)

## 2014-03-14 LAB — URINALYSIS, COMPLETE
BILIRUBIN, UR: NEGATIVE
Bacteria: NONE SEEN
Glucose,UR: 50 mg/dL (ref 0–75)
Leukocyte Esterase: NEGATIVE
NITRITE: NEGATIVE
PH: 5 (ref 4.5–8.0)
RBC,UR: 1 /HPF (ref 0–5)
Specific Gravity: 1.023 (ref 1.003–1.030)
WBC UR: 6 /HPF (ref 0–5)

## 2014-03-14 LAB — TROPONIN I

## 2014-03-14 LAB — PROTIME-INR
INR: 1.1
Prothrombin Time: 14.2 secs (ref 11.5–14.7)

## 2014-03-14 LAB — MAGNESIUM: Magnesium: 1.2 mg/dL — ABNORMAL LOW

## 2014-03-15 LAB — SEDIMENTATION RATE: ERYTHROCYTE SED RATE: 36 mm/h — AB (ref 0–20)

## 2014-03-16 LAB — HEPATIC FUNCTION PANEL A (ARMC)
AST: 32 U/L (ref 15–37)
Albumin: 2.7 g/dL — ABNORMAL LOW (ref 3.4–5.0)
Alkaline Phosphatase: 81 U/L
BILIRUBIN TOTAL: 1.7 mg/dL — AB (ref 0.2–1.0)
Bilirubin, Direct: 0.4 mg/dL — ABNORMAL HIGH (ref 0.0–0.2)
SGPT (ALT): 47 U/L
Total Protein: 6.1 g/dL — ABNORMAL LOW (ref 6.4–8.2)

## 2014-03-16 LAB — BASIC METABOLIC PANEL
Anion Gap: 7 (ref 7–16)
BUN: 13 mg/dL (ref 7–18)
CO2: 30 mmol/L (ref 21–32)
CREATININE: 0.92 mg/dL (ref 0.60–1.30)
Calcium, Total: 8.1 mg/dL — ABNORMAL LOW (ref 8.5–10.1)
Chloride: 104 mmol/L (ref 98–107)
EGFR (African American): >60
EGFR (Non-African Amer.): >60
GLUCOSE: 119 mg/dL — AB (ref 65–99)
Osmolality: 283 (ref 275–301)
POTASSIUM: 3.4 mmol/L — AB (ref 3.5–5.1)
Sodium: 141 mmol/L (ref 136–145)

## 2014-03-16 LAB — CBC WITH DIFFERENTIAL/PLATELET
BASOS PCT: 0.9 %
Basophil #: 0.1 10*3/uL (ref 0.0–0.1)
EOS ABS: 0.2 10*3/uL (ref 0.0–0.7)
Eosinophil %: 2.2 %
HCT: 39.9 % — AB (ref 40.0–52.0)
HGB: 13.3 g/dL (ref 13.0–18.0)
Lymphocyte #: 1.5 10*3/uL (ref 1.0–3.6)
Lymphocyte %: 15.6 %
MCH: 31.1 pg (ref 26.0–34.0)
MCHC: 33.4 g/dL (ref 32.0–36.0)
MCV: 93 fL (ref 80–100)
Monocyte #: 1 x10 3/mm (ref 0.2–1.0)
Monocyte %: 10.3 %
NEUTROS PCT: 71 %
Neutrophil #: 6.6 10*3/uL — ABNORMAL HIGH (ref 1.4–6.5)
Platelet: 149 10*3/uL — ABNORMAL LOW (ref 150–440)
RBC: 4.29 10*6/uL — ABNORMAL LOW (ref 4.40–5.90)
RDW: 13.9 % (ref 11.5–14.5)
WBC: 9.3 10*3/uL (ref 3.8–10.6)

## 2014-03-16 LAB — AMMONIA: Ammonia, Plasma: 31 mcmol/L (ref 11–32)

## 2014-03-16 LAB — TSH: Thyroid Stimulating Horm: 1.28 u[IU]/mL

## 2014-03-17 LAB — BASIC METABOLIC PANEL
ANION GAP: 18 — AB (ref 7–16)
BUN: 12 mg/dL (ref 7–18)
Calcium, Total: 7.9 mg/dL — ABNORMAL LOW (ref 8.5–10.1)
Chloride: 101 mmol/L (ref 98–107)
Co2: 29 mmol/L (ref 21–32)
Creatinine: 0.83 mg/dL (ref 0.60–1.30)
EGFR (Non-African Amer.): >60
Glucose: 140 mg/dL — ABNORMAL HIGH (ref 65–99)
OSMOLALITY: 296 (ref 275–301)
POTASSIUM: 4.1 mmol/L (ref 3.5–5.1)
SODIUM: 148 mmol/L — AB (ref 136–145)

## 2014-03-17 LAB — CBC WITH DIFFERENTIAL/PLATELET
BASOS ABS: 0.1 10*3/uL (ref 0.0–0.1)
Basophil %: 0.9 %
Eosinophil #: 0.1 10*3/uL (ref 0.0–0.7)
Eosinophil %: 0.7 %
HCT: 38.9 % — AB (ref 40.0–52.0)
HGB: 12.9 g/dL — ABNORMAL LOW (ref 13.0–18.0)
Lymphocyte #: 1.7 10*3/uL (ref 1.0–3.6)
Lymphocyte %: 16.9 %
MCH: 30.9 pg (ref 26.0–34.0)
MCHC: 33.1 g/dL (ref 32.0–36.0)
MCV: 93 fL (ref 80–100)
Monocyte #: 1.1 x10 3/mm — ABNORMAL HIGH (ref 0.2–1.0)
Monocyte %: 11.3 %
Neutrophil #: 7.1 10*3/uL — ABNORMAL HIGH (ref 1.4–6.5)
Neutrophil %: 70.2 %
Platelet: 163 10*3/uL (ref 150–440)
RBC: 4.17 10*6/uL — ABNORMAL LOW (ref 4.40–5.90)
RDW: 13.8 % (ref 11.5–14.5)
WBC: 10.1 10*3/uL (ref 3.8–10.6)

## 2014-03-18 ENCOUNTER — Ambulatory Visit: Payer: Self-pay | Admitting: Orthopedic Surgery

## 2014-03-18 LAB — URINALYSIS, COMPLETE
Bilirubin,UR: NEGATIVE
GLUCOSE, UR: NEGATIVE mg/dL (ref 0–75)
Ketone: NEGATIVE
Nitrite: NEGATIVE
PH: 6 (ref 4.5–8.0)
Protein: 30
SPECIFIC GRAVITY: 1.008 (ref 1.003–1.030)

## 2014-03-18 LAB — CBC WITH DIFFERENTIAL/PLATELET
BASOS ABS: 0.1 10*3/uL (ref 0.0–0.1)
BASOS PCT: 0.8 %
Eosinophil #: 0.4 10*3/uL (ref 0.0–0.7)
Eosinophil %: 5.4 %
HCT: 38.4 % — ABNORMAL LOW (ref 40.0–52.0)
HGB: 12.8 g/dL — ABNORMAL LOW (ref 13.0–18.0)
LYMPHS ABS: 1.5 10*3/uL (ref 1.0–3.6)
LYMPHS PCT: 18.4 %
MCH: 30.6 pg (ref 26.0–34.0)
MCHC: 33.3 g/dL (ref 32.0–36.0)
MCV: 92 fL (ref 80–100)
MONO ABS: 0.9 x10 3/mm (ref 0.2–1.0)
MONOS PCT: 10.9 %
NEUTROS ABS: 5.3 10*3/uL (ref 1.4–6.5)
NEUTROS PCT: 64.5 %
Platelet: 168 10*3/uL (ref 150–440)
RBC: 4.17 10*6/uL — ABNORMAL LOW (ref 4.40–5.90)
RDW: 13.6 % (ref 11.5–14.5)
WBC: 8.2 10*3/uL (ref 3.8–10.6)

## 2014-03-18 LAB — URINE CULTURE

## 2014-03-19 LAB — BASIC METABOLIC PANEL
ANION GAP: 7 (ref 7–16)
BUN: 19 mg/dL — AB (ref 7–18)
Calcium, Total: 8.2 mg/dL — ABNORMAL LOW (ref 8.5–10.1)
Chloride: 102 mmol/L (ref 98–107)
Co2: 31 mmol/L (ref 21–32)
Creatinine: 0.87 mg/dL (ref 0.60–1.30)
EGFR (African American): >60
EGFR (Non-African Amer.): >60
Glucose: 83 mg/dL (ref 65–99)
Osmolality: 281 (ref 275–301)
Potassium: 3.6 mmol/L (ref 3.5–5.1)
Sodium: 140 mmol/L (ref 136–145)

## 2014-03-20 LAB — BASIC METABOLIC PANEL
Anion Gap: 9 (ref 7–16)
BUN: 17 mg/dL (ref 7–18)
Calcium, Total: 8.4 mg/dL — ABNORMAL LOW (ref 8.5–10.1)
Chloride: 103 mmol/L (ref 98–107)
Co2: 29 mmol/L (ref 21–32)
Creatinine: 0.85 mg/dL (ref 0.60–1.30)
EGFR (African American): >60
GLUCOSE: 71 mg/dL (ref 65–99)
Osmolality: 281 (ref 275–301)
Potassium: 3.4 mmol/L — ABNORMAL LOW (ref 3.5–5.1)
SODIUM: 141 mmol/L (ref 136–145)

## 2014-03-20 LAB — CBC WITH DIFFERENTIAL/PLATELET
BASOS ABS: 0.1 10*3/uL (ref 0.0–0.1)
BASOS PCT: 0.9 %
EOS PCT: 11 %
Eosinophil #: 0.9 10*3/uL — ABNORMAL HIGH (ref 0.0–0.7)
HCT: 39.7 % — ABNORMAL LOW (ref 40.0–52.0)
HGB: 13.3 g/dL (ref 13.0–18.0)
LYMPHS ABS: 1.6 10*3/uL (ref 1.0–3.6)
LYMPHS PCT: 19.6 %
MCH: 30.6 pg (ref 26.0–34.0)
MCHC: 33.5 g/dL (ref 32.0–36.0)
MCV: 91 fL (ref 80–100)
MONO ABS: 0.8 x10 3/mm (ref 0.2–1.0)
Monocyte %: 9.8 %
Neutrophil #: 4.7 10*3/uL (ref 1.4–6.5)
Neutrophil %: 58.7 %
Platelet: 212 10*3/uL (ref 150–440)
RBC: 4.35 10*6/uL — ABNORMAL LOW (ref 4.40–5.90)
RDW: 13.3 % (ref 11.5–14.5)
WBC: 8 10*3/uL (ref 3.8–10.6)

## 2014-03-20 LAB — URINE CULTURE

## 2014-03-21 ENCOUNTER — Encounter: Payer: Self-pay | Admitting: Internal Medicine

## 2014-03-21 LAB — CULTURE, BLOOD (SINGLE)

## 2014-04-04 ENCOUNTER — Encounter: Payer: Self-pay | Admitting: Internal Medicine

## 2014-04-23 ENCOUNTER — Emergency Department: Payer: Self-pay | Admitting: Emergency Medicine

## 2014-04-23 LAB — URINALYSIS, COMPLETE
Bilirubin,UR: NEGATIVE
Hyaline Cast: 4
Ketone: NEGATIVE
Nitrite: NEGATIVE
Ph: 5 (ref 4.5–8.0)
Protein: 100
SPECIFIC GRAVITY: 1.015 (ref 1.003–1.030)
Squamous Epithelial: 1

## 2014-05-05 ENCOUNTER — Encounter: Payer: Self-pay | Admitting: Internal Medicine

## 2014-05-31 ENCOUNTER — Emergency Department: Payer: Self-pay | Admitting: Emergency Medicine

## 2014-05-31 LAB — PROTIME-INR
INR: 1.9
PROTHROMBIN TIME: 21.4 s — AB (ref 11.5–14.7)

## 2014-05-31 LAB — CBC
HCT: 39.4 % — AB (ref 40.0–52.0)
HGB: 12.9 g/dL — ABNORMAL LOW (ref 13.0–18.0)
MCH: 29.2 pg (ref 26.0–34.0)
MCHC: 32.7 g/dL (ref 32.0–36.0)
MCV: 90 fL (ref 80–100)
PLATELETS: 198 10*3/uL (ref 150–440)
RBC: 4.41 10*6/uL (ref 4.40–5.90)
RDW: 15.3 % — ABNORMAL HIGH (ref 11.5–14.5)
WBC: 8.1 10*3/uL (ref 3.8–10.6)

## 2014-06-01 ENCOUNTER — Ambulatory Visit: Payer: Self-pay | Admitting: Rheumatology

## 2014-06-05 ENCOUNTER — Emergency Department: Payer: Self-pay | Admitting: Emergency Medicine

## 2014-06-05 LAB — CBC WITH DIFFERENTIAL/PLATELET
Basophil #: 0.1 10*3/uL (ref 0.0–0.1)
Basophil %: 0.7 %
EOS ABS: 0.4 10*3/uL (ref 0.0–0.7)
Eosinophil %: 4.7 %
HCT: 41.3 % (ref 40.0–52.0)
HGB: 13.3 g/dL (ref 13.0–18.0)
Lymphocyte #: 1.4 10*3/uL (ref 1.0–3.6)
Lymphocyte %: 14.8 %
MCH: 29 pg (ref 26.0–34.0)
MCHC: 32.3 g/dL (ref 32.0–36.0)
MCV: 90 fL (ref 80–100)
Monocyte #: 0.8 x10 3/mm (ref 0.2–1.0)
Monocyte %: 8.9 %
Neutrophil #: 6.5 10*3/uL (ref 1.4–6.5)
Neutrophil %: 70.9 %
Platelet: 188 10*3/uL (ref 150–440)
RBC: 4.59 10*6/uL (ref 4.40–5.90)
RDW: 15.2 % — ABNORMAL HIGH (ref 11.5–14.5)
WBC: 9.2 10*3/uL (ref 3.8–10.6)

## 2014-06-05 LAB — TROPONIN I: Troponin-I: 0.02 ng/mL

## 2014-06-05 LAB — COMPREHENSIVE METABOLIC PANEL
ALT: 17 U/L (ref 14–63)
ANION GAP: 5 — AB (ref 7–16)
AST: 27 U/L (ref 15–37)
Albumin: 3 g/dL — ABNORMAL LOW (ref 3.4–5.0)
Alkaline Phosphatase: 68 U/L (ref 46–116)
BUN: 13 mg/dL (ref 7–18)
Bilirubin,Total: 1.8 mg/dL — ABNORMAL HIGH (ref 0.2–1.0)
CALCIUM: 8.5 mg/dL (ref 8.5–10.1)
CHLORIDE: 104 mmol/L (ref 98–107)
Co2: 30 mmol/L (ref 21–32)
Creatinine: 1.16 mg/dL (ref 0.60–1.30)
Glucose: 198 mg/dL — ABNORMAL HIGH (ref 65–99)
Osmolality: 283 (ref 275–301)
Potassium: 3.6 mmol/L (ref 3.5–5.1)
Sodium: 139 mmol/L (ref 136–145)
Total Protein: 7.6 g/dL (ref 6.4–8.2)

## 2014-06-05 LAB — PROTIME-INR
INR: 1.8
Prothrombin Time: 20.6 secs — ABNORMAL HIGH

## 2014-06-05 LAB — URINALYSIS, COMPLETE
Bacteria: NONE SEEN
Bilirubin,UR: NEGATIVE
Glucose,UR: >=500 mg/dL
Ketone: NEGATIVE
Nitrite: NEGATIVE
Ph: 6
Protein: 100
RBC,UR: 6 /HPF
Specific Gravity: 1.02
Squamous Epithelial: 1
WBC UR: 15 /HPF

## 2014-06-05 LAB — APTT: Activated PTT: 37.2 s — ABNORMAL HIGH

## 2014-08-25 NOTE — Discharge Summary (Signed)
PATIENT NAME:  Troy [name], [name]:  [id number] DATE OF BIRTH:  1924/04/26  DATE OF ADMISSION:  06/22/2012 DATE OF DISCHARGE:    DISCHARGE DIAGNOSES: 1.  Generalized weakness.  2.  Recurrent falls.  3.  Pneumonia.  4.  Type 2 diabetes.  5.  Hypertension.  6.  Hyperlipidemia.  7.  History of coronary artery disease.   CHIEF COMPLAINT: Generalized weakness and recurrent falls.   HISTORY OF PRESENT ILLNESS: [name] is an 81 year old male with a history of coronary artery disease, hypertension, Type 2 diabetes, A. fib, obesity, hyperlipidemia who normally walks with a cane but for the last 2 to 3 days has become weak and reportedly fallen down 3 or 4 times because of leg weakness and unable to bear weight. He also has been more fatigued. Apparently he had not been eating well and also has been complaining of nausea. The patient was admitted for possible pneumonia and for IV antibiotics.   PAST MEDICAL HISTORY:  Significant for CAD, osteoarthritis, hypertension, obesity diverticulosis, colonic polyps, BPH, spinal stenosis, sleep apnea, macular degeneration, paroxysmal atrial fibrillation, rosacea, gastroesophageal reflux disease.   PHYSICAL EXAMINATION: VITAL SIGNS: Temperature 98.1, pulse 94, respirations 18, blood pressure 122/89, pulse ox 96% on room air.  GENERAL: He was alert and oriented.  HEENT: Normocephalic, atraumatic. Mucosa is moist.  NECK: Supple. No JVD.  HEART: S1, S2.  ABDOMEN: Soft, nontender.  EXTREMITIES: No edema.   NEUROLOGIC: Alert and oriented x 3. No obvious focal weakness noted in both lower extremities.   LABORATORY AND DIAGNOSTIC DATA:  Glucose 139, BUN 12, creatinine 1.0, sodium 139, potassium 3.4, chloride 105, CO2 27, calcium 8.6, total protein 7.2, albumin 3.1, bilirubin 1.2, alkaline phosphatase 68, SGOT 25, SGPT 24. Troponin less than 0.02. WBC 8.9, hemoglobin 15.1, platelets 153, PT was 15.4, INR 1.2. Urinalysis was hazy, with 5 WBCs, 1+ leukocyte  esterase, no bacteria.   HOSPITAL COURSE:  The patient was admitted to [facility] and received intravenous antibiotics.  During his stay in the hospital, he gradually improved. A CT of the head without contrast showed no evidence of acute intracranial hemorrhage. There is no other acute intracranial abnormality present. There was age-related changes and small vessel ischemia. Chest x-ray showed evidence of stable cardiomegaly. There is pulmonary vascular congestion without any effusion or pneumothorax. The patient was also seen by physical therapy and gradually improved. It was felt that he would benefit from skilled nursing for gait training and strengthening and was discharged in stable condition to skilled nursing facility on the following medications.   DISCHARGE MEDICATIONS:  Metformin 1000 mg p.o. b.i.d. Verapamil 180 mg daily. Glipizide 10 mg once a day. Lipitor 40 mg at bedtime. Coreg 12.5 mg b.i.d. NovoLog Flex pen 10 units subcutaneous t.i.d. Warfarin 7.5 mg Saturday and Sunday and 8 mg Monday through Friday. Tylenol 500 mg every 4 to 6 hours p.r.n. PreserVision 2 capsules a day for macular degeneration. Ceftin 250 mg p.o. b.i.d. for 7 more days.   FOLLOWUP: The patient has been advised to follow-up with me, Dr. Marcello FennelHande, in 1 to 2 weeks' time.   Total time spent in discharging patient: 35 minutes    ____________________________ Barbette ReichmannVishwanath Reon Hunley, MD vh:ct D: 06/22/2012 12:49:06 ET T: 06/22/2012 13:21:49 ET JOB#: [id number]  cc: Barbette ReichmannVishwanath Doree Kuehne, MD, <Dictator> Barbette ReichmannVISHWANATH Damion Kant MD ELECTRONICALLY SIGNED 06/29/2012 13:39

## 2014-08-25 NOTE — Discharge Summary (Signed)
PATIENT NAME:  Troy [name], [name]:  [id number] DATE OF BIRTH:  May 05, 1924  DATE OF ADMISSION:  10/27/2012 DATE OF DISCHARGE:  10/29/2012  DIAGNOSES AT TIME OF DISCHARGE:  1. Hypotension secondary to dehydration and urinary tract infection.  2. Unsteady gait with falls.  3. Type 2 diabetes.  4. History of atrial fibrillation.   CHIEF COMPLAINT: Confusion, nausea, diarrhea, weakness and hypotension.   HISTORY OF PRESENT ILLNESS: Troy [name] is an 81 year old male with a history of type 2 diabetes, hypertension and hyperlipidemia, who came to the ER after he was noted to be confused. According to his wife, the patient reportedly took his blood pressure medication and also insulin shot, went to the barber shop to get his hair done and subsequently became nauseous and had 1 large diarrheal stool. The patient came home and was noted to be pale, clammy and slumped onto the chair, and when EMS arrived, his blood pressure was noted to be low at 80/50. He was given 200 mL of IV normal saline and subsequently brought to the ER, where his blood pressure was in the upper 90s. He was more awake and alert, and his color significantly improved, but the patient was noted to have a urinary tract infection associated with dehydration.   PAST MEDICAL HISTORY: Significant for nephrolithiasis, hypertension, TIA, chronic back pain, type 2 diabetes, BPH, peripheral neuropathy, macular degeneration, essential tremors, chronic AFib, history of bladder stimulator, history of cholecystectomy, knee replacement surgery, sleep apnea. Also has a history of actinic keratosis, basal cell carcinoma mitral valve repair, history of septoplasty.   PHYSICAL EXAMINATION: HEART: S1, S2.  LUNGS: Clear.  ABDOMEN: Soft, nontender.  EXTREMITIES: No edema. VITAL SIGNS: Blood pressure initially was 90 systolic, subsequently improved to 138/81.   HOSPITAL COURSE: During his stay in the hospital, the patient received IV fluids and IV  antibiotics and he quickly improved. Lab work showed glucose of 266, BUN 13, creatinine 1.3, sodium 139, potassium 3.9, chloride 106. Troponin less than 0.02. WBC count 9.4, hemoglobin 14.7, hematocrit 43.3. Urine culture grew mixed bacterial organisms. CT of the head did not show any evidence of ischemic or hemorrhagic infarction. There were no intracranial mass effect. There was stable moderate diffuse cerebral and cerebellar atrophy with compensatory ventriculomegaly. The patient significantly improved over the next day and was stable at the time of discharge. He was discharged on the following medications.   DISCHARGE MEDICATIONS:  1. Metformin 1000 mg p.o. b.i.d.  2. Glipizide 10 mg b.i.d. 3. Lisinopril 40 mg once a day.  4. Hydrocodone 1 tablet t.i.d. p.r.n. 5. Carvedilol 12.5 mg p.o. b.i.d. 6. Atorvastatin 40 mg once a day.  7. PreserVision 1 capsule b.i.d. 8. Warfarin 3 mg 1 tablet in the morning on Monday, Tuesday, Wednesday, Thursday and Friday and 5 mg other days.  9. Insulin as per sliding scale. 10. Verapamil 120 mg every 24 hours once a day. 11. Cipro 250 mg p.o. b.i.d. for 5 more days.   FOLLOWUP: The patient has been advised to follow with me, Dr. Marcello FennelHande, in 1 to 2 weeks' time.   Total time spent in discharge of this patientt;30 minutes  ____________________________ Barbette ReichmannVishwanath Tiaja Hagan, MD vh:OSi D: 11/04/2012 13:37:47 ET T: 11/04/2012 13:59:36 ET JOB#: [id number]  cc: Barbette ReichmannVishwanath Erinn Mendosa, MD, <Dictator> Barbette ReichmannVISHWANATH Sary Bogie MD ELECTRONICALLY SIGNED 11/16/2012 17:33

## 2014-08-25 NOTE — H&P (Signed)
PATIENT NAME:  Troy [name], Troy [name] MR#:  [id number] DATE OF BIRTH:  August 02, 1923  DATE OF ADMISSION:  06/20/2012  PRIMARY CARE PHYSICIAN: Barbette ReichmannVishwanath Hande, MD  REFERRING PHYSICIAN:  Jene Everyobert Kinner, MD   CHIEF COMPLAINT: Weakness, generalized.   HISTORY OF PRESENT ILLNESS: This is an 81 year old male with past medical history of coronary artery disease, hypertension, diabetes, atrial fibrillation, obesity, hyperlipidemia, colonic polyps, who is usually in very good state of health and able to walk with a cane and lives with family. For the last 2 days, is more than weak and he has fallen down 3 to 4 times in the last 2 days because of weakness in his legs and he is not able to bear weight properly.  He also looks more tired and slower than what he usually is, but he is oriented to his surroundings, as per the wife. He also had decreased intake and he did not feel to eat or drink anything for the last 2 days. He just ate very few bites and drank a little in the last 2 days, as per the wife. He says that  looking at the food and eating it makes him nauseated. In the ER, there was no clear source of infection, but his UA was mildly positive, and his x-ray has some questionable infiltrate on the right lower lobe, so he is being admitted under observation for finding out the further source of his weakness.   REVIEW OF SYSTEMS:   CONSTITUTIONAL:  Negative for fever, fatigue, pain or weight loss, but has generalized weakness.  EYES: No blurring or double lesion or any redness or discharge.  ENT: No tinnitus, ear pain, hearing loss or any bleeding or postnasal drips.  RESPIRATORY: No cough, wheezing, hemoptysis or shortness of breath.  CARDIOVASCULAR: No chest pain, orthopnea, edema, arrhythmia or palpitations.  GASTROINTESTINAL: No nausea, vomiting, diarrhea or abdominal pain.  GENITOURINARY: No dysuria, hematuria or increased frequency of urination.  ENDOCRINE: No polydipsia,  nocturia or heat or cold  intolerance.  SKIN: No rashes or lesions on the skin.  MUSCULOSKELETAL: No pain or swelling of the joints.  NEUROLOGICAL: No numbness, tremors, headache or migraine, but has generalized weakness.  PSYCHIATRIC: No anxiety, insomnia or depression.   PAST MEDICAL HISTORY: Coronary artery disease, osteoarthritis, hypertension, obesity, diverticulosis, colonic polyps, benign prostatic hypertrophy, spinal stenosis, sleep apnea, macular degeneration, paroxysmal atrial fibrillation, rosacea, anxiety, gastroesophageal reflux disease, type 2 diabetes.   PAST SURGICAL HISTORY: Basal cell carcinoma excision, left total knee replacement, cholecystectomy, appendectomy, tonsillectomy and adenoidectomy, umbilical hernia cystoscopy, mitral valve repair, septoplasty.   CURRENT MEDICATIONS: As confirmed by pharmacy tech in the ER:  1.  Cetirizine 10 mg oral tablet once a day. 2.  Coreg 12.5 mg oral tablet 2 times a day. 3.  Glipizide 10 mg oral tablet once a day. 4.  Lipitor 40 mg oral tablet once a day.  5.  Metformin 1000 mg oral tablet 2 times a day.  6.  NovoLog Flex Pen subcutaneous solution 10 units 3 times a day.  7.  Multivitamin 2 capsules orally once a day.  8.  Tylenol 500 mg oral tablet 2 tablets every 6 hours as needed for pain or fever.  9. Verapamil 180 mg 12-hour oral tablet extended release 1 tablet orally once a day in the morning.  10.  Warfarin 8 mg total from Monday through Friday daily and warfarin 7.5 mg total once a day on Saturday and Sunday   SOCIAL HISTORY: He denies drinking alcohol  on a day to day basis, just drinks like once or twice a month, and he does not smoke. Lives with the family and very independent before onset of these symptoms 2 days ago.   FAMILY HISTORY: Negative for any major illness.  PHYSICAL EXAMINATION:   VITAL SIGNS:  In the ER, temperature 98.1, pulse rate 94, respiratory 18 and blood pressure 122/89, pulse oximetry 96% on room air.  GENERAL: He is  well-developed, well-nourished and appropriate for his age, not in acute distress. He is fully alert and oriented to time, place and person and cooperative with history taking and physical examination.  HEENT: Head and neck atraumatic. Conjunctivae pink. Oral mucosa moist.  NECK: Supple. No JVD.  CARDIOVASCULAR: S1, S2 present, regular. No murmur is appreciated.  ABDOMEN: Soft, nontender. Bowel sounds present. No organomegaly appreciated.  SKIN: No rashes.  JOINTS: No swelling or tenderness present.  PSYCHIATRIC: Does not appear in any acute psychiatric illness at this point of time.   LABORATORY, DIAGNOSTIC AND RADIOLOGIC DATA:  Glucose 139, BUN 12, creatinine 1.0, sodium 139, potassium 3.4, chloride 105, CO2 of 27, calcium 8.6, total protein 7.2, albumin 3.1, bilirubin 1.2, alkaline phosphate 68, SGOT 25, SGPT 24. Troponin less than 0.02. WBC 8.9, hemoglobin 15.1, platelet count 153, MCV 91. Prothrombin 15.4, INR 1.2.   Urinalysis: Yellow, hazy, WBC is 5 and leukocyte esterase 1+. Bacteria is not seen   Chest x-ray as per the ER physician: There is right lower lobe infiltrate present, but due to outage of the system, I am not able to see that and there is no official report available at this point.   ASSESSMENT AND PLAN:  1.  An 81 year old male with multiple past medical histories presented with generalized weakness for the last 2 days. Possibly this is due to urinary tract infection. We will start him on Rocephin intravenously and we will get, further, the urine culture.  We will also wait for the official report of chest x-ray, as it looks questionable.  If it is positive, then he is also on Rocephin for the treatment of pneumonia. We will repeat the x-ray in the morning.  2.  Hypertension. We will continue home medication.  3.  Diabetes mellitus. We will continue home medication.  4.  Coronary artery disease, valvular disease and atrial fibrillation. He is on Coumadin, but INR is 1.2. Deep  vein thrombosis prophylaxis with heparin subcutaneous and we will continue Coumadin.  Recheck PT and INR.  5.  Hyperlipidemia. Atorvastatin continued.  6.  Generalized weakness. PT evaluation is called in; possibly this is because of the infection, but we will wait for more results being available.   CODE STATUS: Full code.   TOTAL TIME SPENT: 50 minutes.    ____________________________ Hope Pigeon Elisabeth Pigeon, MD vgv:cc D: 06/20/2012 15:44:35 ET T: 06/20/2012 16:18:55 ET JOB#: [id number]  cc: Hope Pigeon. Elisabeth Pigeon, MD, <Dictator> Barbette Reichmann, MD  Altamese Dilling MD ELECTRONICALLY SIGNED 06/25/2012 19:47

## 2014-08-25 NOTE — H&P (Signed)
PATIENT NAME:  Troy [name], Troy [name] MR#:  [id number] DATE OF BIRTH:  09-Feb-1924  DATE OF ADMISSION:  10/27/2012  PRIMARY CARE PHYSICIAN:  Barbette Reichmann, MD  CHIEF COMPLAINT:  Altered mental status and confusion today.   HISTORY OF PRESENT ILLNESS:  [name] is an 81 year old Caucasian gentleman with past medical history of diabetes, hypertension and hyperlipidemia who comes to the Emergency Room after he was found to have some confusion noted at home by wife. Most of the history is obtained by the patient's wife who reports the patient took his medications including his blood pressure, took his insulin shot this morning and went to barber shop to get his hair done. At the barber shop, the patient started having some nausea, had 1 episode of large diarrheal stools, thereafter did not feel well, came home, appeared to be very pale, clammy, slumped down in the chair and that is when family called EMS. He was noted to have a blood pressure of 80/50. EMS gave 200 mL of IV fluid bolus. The patient came to the Emergency Room. His blood pressure was in the upper 90s. After some IV fluids, his blood pressure currently is in the 110s. He is more awake and alert per family and appears to have improved "his color." The patient is noted to have a UTI. He appears clinically dehydrated and also has been having falls secondary to ataxia and visual problems at home. He is being admitted for further evaluation and management.   PAST MEDICAL HISTORY:   1.  Nephrolithiasis.  2.  Hypertension.  3.  TIA.  4.  Chronic back pain.  5.  Type 2 diabetes.  6.  BPH.  7.  Peripheral neuropathy secondary to diabetes.  8.  Macular degeneration.  9.  Essential tremors.  10.  Chronic atrial fibrillation on Coumadin.  11.  History of bladder stimulator.  12.  Cholecystectomy.  13.  Knee replacement surgery.  14.  Sleep apnea.  15.  Actinic keratosis.  16.  History of basal cell carcinoma excision.  17.  Mitral valve  repair. The patient has had open heart surgery.  18.  History of septoplasty.   HOME MEDICATIONS:   1.  Zyrtec 10 mg daily.  2.  Warfarin 7.5 mg on Sunday and Saturday.  3.  Warfarin 8 mg on Monday, Tuesday, Wednesday, Thursday and Friday.  4.  Verapamil 180 mg daily.  5.  PreserVision multivitamin daily b.i.d.  6.  Metformin 1000 mg b.i.d.  7.  Lisinopril 40 mg daily.  8.  Humulin R per sliding scale.  9.  Glipizide 10 mg b.i.d.  10.  Carvedilol 12.5 mg b.i.d.  11.  Atorvastatin 40 mg p.o. daily.  12.  Acetaminophen/hydrocodone 325/5 mg p.o. 3 times a day.   ALLERGIES:  No known drug allergies.   FAMILY HISTORY:  Positive for hypertension and diabetes. Mother died of at age 38 of CVA. Father died at age 82 of MI.   SOCIAL HISTORY:  Lives at home with wife. Retired from [employer]. He does not smoke or use alcohol or caffeine.   REVIEW OF SYSTEMS:  CONSTITUTIONAL: No fever, fatigue, weakness.  EYES: No blurred or double vision.  ENT: No tinnitus, ear pain, hearing loss, postnasal drip.  RESPIRATORY: No cough, wheeze, COPD.  CARDIOVASCULAR: No chest pain, orthopnea, edema or palpitations.  GASTROINTESTINAL: No nausea, vomiting. Positive for diarrhea. No abdominal pain or GERD.  GENITOURINARY: No dysuria, hematuria, frequency.  ENDOCRINE: No polyuria, nocturia or thyroid  problems.  HEMATOLOGY: No anemia or easy bruising.  SKIN: No acne or rash.  MUSCULOSKELETAL: Positive for arthritis and back pain.  NEUROLOGIC: Positive for peripheral neuropathy and ataxia. No dysarthria, TIA or CVA.  PSYCHIATRIC: No anxiety or depression. All other systems reviewed are negative.   DIAGNOSTIC DATA:  Urinalysis is positive for UTI. CT of the head is negative for any acute ischemic or hemorrhagic changes. CBC is within normal limits. Basic metabolic panel is within normal limits except GFR of 49 and glucose of 266. PT and INR are 15.1 and 1.2. Troponin is 0.02. EKG shows atrial fibrillation with  PVCs, appears chronic.   ASSESSMENT AND PLAN: An 81 year old male with history of hypertension, hyperlipidemia, type 2 diabetes who comes in with:  1.  Hypotension suspected due to dehydration. The patient came in with blood pressure in the 70s to 80s improved after intravenous fluids to 110s. We will admit the patient to the medical floor with off unit telemetry, hold off blood pressure medications until blood pressure stabilizes. Continue intravenous fluids for hydration.  2.  Urinary tract infection. We will give Cipro for 5 days. The patient's urine looks concentrated and foul-smelling. We will send urine cultures and blood cultures.  3.  Clinical dehydration, intravenous fluids for now. Follow up blood pressure clinically and resume blood pressure medications once the blood pressure is stable.  4.  Falls with ataxia at home. This appears to be present by macular degeneration and peripheral neuropathy. We will have physical therapy see patient.  5.  Type 2 diabetes on Humulin R via sliding scale along with p.o. glipizide and metformin which will be resumed.  6.  Deep vein thrombosis prophylaxis with heparin subcutaneous t.i.d.   The above was discussed with patient and the patient's wife who is agreeable to it.   The patient is a full code.   TIME SPENT:  50 minutes.    ____________________________ Wylie HailSona A. Allena KatzPatel, MD sap:si D: 10/27/2012 17:37:30 ET T: 10/27/2012 18:18:07 ET JOB#: [id number]  cc: Maliik Karner A. Allena KatzPatel, MD, <Dictator> Barbette ReichmannVishwanath Hande, MD Willow OraSONA A Annalysa Mohammad MD ELECTRONICALLY SIGNED 11/22/2012 15:51

## 2014-08-26 NOTE — Discharge Summary (Signed)
PATIENT NAME:  Troy [name], Troy [name] MR#:  [id number] DATE OF BIRTH:  Jul 25, 1923  DATE OF ADMISSION:  07/23/2013 DATE OF DISCHARGE:  07/26/2013  DIAGNOSES AT THE TIME OF DISCHARGE:  1.  Chronic systolic congestive heart failure.  2.  Pleural effusion.  3.  Fall with recent left rib fractures.  4.  Chronic atrial fibrillation.  5.  Ischemic cardiomyopathy.  6.  Obesity.  7.  Type 2 diabetes.  8.  Coronary artery disease, status post coronary artery bypass graft.   CHIEF COMPLAINT: Shortness of breath, chest discomfort.   HISTORY OF PRESENT ILLNESS: Troy [name] is an 81 year old male with a history of chronic A. fib, CAD, status post CABG, ischemic cardiomyopathy, chronic systolic congestive heart failure, who recently fell and sustained left-sided rib fractures. The patient has been less ambulatory and splinting his chest when he breathes. He presented the ED complaining of worsening shortness breath and was noted to be hypoxemic. The patient was also noted to have a left pleural effusion with worsening pulmonary edema and he was admitted for IV diuretic therapy.   PAST MEDICAL HISTORY: Significant for recent rib fractures, chronic atrial fibrillation, atherosclerotic cardiovascular disease, status post CABG, ischemic cardiomyopathy, benign hypertension, history of TIAs, BPH, chronic systolic congestive heart failure, nephrolithiasis, chronic low back pain, type 2 diabetes, essential tremor, macular degeneration and status post cholecystectomy.   PHYSICAL EXAMINATION:  GENERAL: The patient was not in distress.  VITAL SIGNS: Blood pressure was 100/61, heart rate 76, respirations 24, temperature 98.3, oxygen saturation 96% on 2 L.  HEENT: NCAT.  NECK: Supple. No JVD.  LUNGS: Bibasilar crackles with decreased air entry especially in the left base. No wheezes.  ABDOMEN: Soft, nontender.  HEART: S1, S2, irregular rhythm.  EXTREMITIES: 1+ edema.  NEUROLOGIC: Nonfocal.  EKG revealed A. fib. with  heart rate of 75 beats per minute. No acute ischemic changes. Chest x-ray revealed a left pleural effusion with possible atelectasis and pulmonary edema and cardiomegaly. Glucose was 228, BUN 17, creatinine 1.13, sodium 137, potassium 4.4. BNP was 1149. Troponin less than 0.02. WBC count 6.4, hemoglobin 13.5.   HOSPITAL COURSE: The patient was admitted to telemetry and placed on IV Lasix, oxygen and DuoNeb SVNs. He was also seen by cardiologist, Dr. Gwen PoundsKowalski. The patient did well while in the hospital and his hypoxemia resolved to some extent, although he continued to require oxygen with ambulation. The patient underwent an echocardiogram, which showed LVEF 40% to 45% with mildly decreased global left ventricular systolic function, mildly dilated left atrium and mildly dilated right atrium, mild to moderate mitral valve regurgitation with moderately elevated pulmonary artery systolic pressure and mild to moderate tricuspid regurgitation and moderately increased left ventricular posterior wall thickness. Home health nursing was also arranged for him. He was started on IV Rocephin and subsequently switched to p.o. Ceftin and discharged in stable condition on the following medications:   DISCHARGE MEDICATIONS: Ceftin 250 mg p.o. b.i.d. for 1 more week, verapamil 120 extended release tablet 1 tablet once a day, furosemide 40 mg b.i.d., warfarin 6 mg tablet a total of 9 mg a day, acetaminophen/hydrocodone 325/5, 1 tablet t.i.d., PreserVision 1 capsule b.i.d., carvedilol 12.5 mg b.i.d., lisinopril 40 mg once a day, glipizide 10 mg twice a day, metformin 1000 mg b.i.d., human recombinant regular insulin 6 units t.i.d. with meals. Home oxygen was also arranged for him. Home health nursing was arranged for him and he was discharged in stable condition. The patient has been advised to follow with  me, Dr. Marcello Fennel, in 1 to 2 weeks' time and to call the clinic with any questions or concerns.   TOTAL TIME SPENT IN DISCHARGING  THE PATIENT: 35 minutes.  ____________________________ Barbette Reichmann, MD vh:aw D: 07/27/2013 08:22:41 ET T: 07/27/2013 09:18:39 ET JOB#: [id number]  cc: Barbette Reichmann, MD, <Dictator> Barbette Reichmann MD ELECTRONICALLY SIGNED 08/10/2013 13:41

## 2014-08-26 NOTE — H&P (Signed)
PATIENT NAME:  Troy [name], Troy [name] MR#:  [id number] DATE OF BIRTH:  21-Feb-1924  DATE OF ADMISSION:  03/14/2014  REFERRING PHYSICIAN:  [email or web address]  PRIMARY CARE PHYSICIAN:  [id number]  CHIEF COMPLAINT:  Weakness.   HISTORY OF PRESENT ILLNESS:  An 81 year old Caucasian gentleman with history of afib, previously on warfarin therapy, coronary artery disease status post CABG, as well as hypertension, type 2 diabetes, insulin-requiring; however, no acute complications, presented with weakness. The patient fell at home 1 day ago, essentially according to wife at bedside he slid out of the bed. He then started complaining of right wrist pain and thus brought to the hospital for further work-up and evaluation. While here no acute findings, he was actually cleared for discharge; however, unable to ambulate at that time. Upon further questioning, the wife states that he has had progressively worsening generalized weakness for about 3 weeks in total duration, however greatly worsening over the last 4 days as well as intermittent confusion for the same 3 week duration. No further symptomatology noted. The patient himself is rather poor historian history is aided by wife at bedside.   REVIEW OF SYSTEMS:  Question the validity of these statements, because once again, rather poor historian; however.  CONSTITUTIONAL: Denies fevers, chills, positive for fatigue, weakness.  EYES: Denies blurred vision, double vision or eye pain.  EARS AND NOSE AND THROAT: Denies tinnitus, ear pain or hearing loss.  RESPIRATORY: Denies cough or shortness of breath.  CARDIOVASCULAR: Denies chest pain, palpitations, edema.  GASTROINTESTINAL:  Denies nausea, vomiting, diarrhea, abdominal pain.  GENITOURINARY: Denies dysuria, hematuria.  ENDOCRINE: Denies nocturia or thyroid problems.  HEMATOLOGIC AND LYMPHATIC: Denies easy bruising, bleeding.  SKIN: Denies rash or lesion.  MUSCULOSKELETAL: Denies pain in neck, back, shoulder, knees, hips or  further arthritic symptoms.  NEUROLOGIC: Denies paralysis, paresthesias.  PSYCHIATRIC: Denies anxiety or depressive symptoms  otherwise full review of systems negative  PAST MEDICAL HISTORY: Atrial fibrillation, chronic; coronary artery disease status post CABG; history of ischemic cardiomyopathy, unknown ejection fraction, hypertension, systolic congestive heart failure; type 2 diabetes, insulin-requiring, however uncomplicated.   SOCIAL HISTORY: No alcohol, tobacco, or drug usage. Patient lives at home, uses a cane for ambulation at baseline.   FAMILY HISTORY: Positive for coronary artery disease as well as diabetes.   ALLERGIES: No known drug allergies.   HOME MEDICATIONS: Include acetaminophen/hydrocodone 325/5 mg p.o. 3 times a day as needed for pain, lisinopril 40 mg p.o. daily, glipizide 10 mg p.o. daily, Humulin 4 units subcutaneous in the morning, Lantus 40 units subcutaneous at bedtime, metformin 1000 mg p.o. b.i.d.   PHYSICAL EXAMINATION:  VITAL SIGNS: Temperature 97.8, heart rate 95, respirations 18, blood pressure 105/62, saturating 95% on room air, weight 98 kg, BMI 31.9.  GENERAL: Weak frail-appearing Caucasian gentleman, currently in no acute distress.  HEAD: Normocephalic, atraumatic.  EYES: Pupils equal, round, reactive to light, extraocular muscles are intact. No scleral icterus.  MOUTH: Dry mucosal membrane. Dentition intact. No abscess noted.  EARS AND NOSE AND THROAT: Clear without exudates, no external lesions.  NECK: Supple. No thyromegaly. No nodules. No JVD.  PULMONARY: Clear to auscultation bilaterally without wheezes, rales, or rhonchi. No use of accessory muscles. Good respiratory effort.  CHEST: Nontender to palpation.  CARDIOVASCULAR: S1, S2, irregular rate, irregular rhythm, no murmurs, rubs, or gallops trace pedal edema; pedal pulses 2+ bilaterally.  GASTROINTESTINAL:  Soft, nontender, nondistended. No masses, positive bowel sounds. No hepatosplenomegaly.   MUSCULOSKELETAL: No swelling, clubbing, or edema, range of motion full  in all extremities.  NEUROLOGIC: Cranial nerves II through XII intact. No gross focal neurological deficits; sensation intact, reflexes intact, strength 4-/5 in all extremities, including proximal and distal flexion and extension.  SKIN: No ulceration, lesions, rash, cyanosis, warm, dry, turgor intact.  PSYCHIATRIC: Mood and affect within normal limits, awake, alert and oriented x 3, insight and judgment are actually intact.    DIAGNOSTIC DATA: EKGs performed, both afib, no ST or T wave abnormalities. X-ray of the right wrist no acute condition. CT head performed no acute intracranial process; remainder of laboratory data, sodium 139, potassium 3.1, chloride 101, bicarbonate 27, BUN 14, creatinine 1.09, glucose 174, LFTs within normal limits, aside from bilirubin 1.3; troponin less than 0.02, WBC 11.2, hemoglobin 15.4, platelets of 213,000, urinalysis negative for evidence of infection.   ASSESSMENT AND PLAN: An 81 year old gentleman with history of atrial fibrillation, hypertension, diabetes presenting with weakness.  1.  Generalized weakness. It appears this has been progressively worsening over the past about 3 weeks in total; however, worsening even more so in the last 4 days duration, no acute findings on labs or physical examination, we will get a physical therapy evaluation.   2.  Hypokalemia. Replace potassium, with goal of 4 to 5; we will also check a magnesium level at this time.  3.  Type 2 diabetes, insulin-requiring, however, uncomplicated. Hold p.o. agents, add insulin sliding scale with continuous home dosages of Lantus.  4.  Deep venous thrombosis prophylaxis with heparin subcutaneous.   TIME SPENT: Was 45 minutes.    ____________________________ Cletis Athens. Hower, MD dkh:nt D: 03/14/2014 20:48:37 ET T: 03/14/2014 21:21:27 ET JOB#: [id number]  cc: Cletis Athens. Hower, MD, <Dictator> DAVID Synetta Shadow MD ELECTRONICALLY  SIGNED 03/14/2014 23:07

## 2014-08-26 NOTE — Discharge Summary (Signed)
PATIENT NAME:  Troy [name], Troy [name] MR#:  [id number] DATE OF BIRTH:  10-26-1923  DATE OF ADMISSION:  03/14/2014 DATE OF DISCHARGE:  03/20/2014  DISCHARGE DIAGNOSES:  1.  Generalized weakness with fall. 2.  Possible nondisplaced fracture of the distal radius metaphysis, degenerative changes at the first carpometacarpal joint. 3.  Type 2 diabetes. 4.  Coronary artery disease, status post previous CABG. 5.  History of atrial fibrillation. 6.  Parkinson disease.   CHIEF COMPLAINT: Generalized weakness.   HISTORY OF PRESENT ILLNESS: Troy [name] is an 81 year old gentleman with history A-fib, previously on warfarin therapy, CAD status post CABG, history of hypertension and type 2 diabetes who fell at home a day prior to admission. Reportedly, the patient slid out of bed and initially complained of right wrist pain and subsequently developed also left wrist pain. The patient has become progressively weak over the last 3 to 4 weeks and also has been exhibiting intermittent spells of confusion.   PAST MEDICAL HISTORY: Significant for CAD status post CABG, history of ischemic cardiomyopathy, atrial fibrillation, systolic congestive heart failure and type 2 diabetes. Please see H and P for other details.   HOSPITAL COURSE: [facility] and was seen initially by orthopedics and underwent x-ray of the right wrist which did not show any evidence of fracture or dislocation. X-ray of the right elbow also did not show any acute fracture or subluxation; however, an x-ray of the left wrist showed suspected nondisplaced fracture of the distal radius metaphysis, soft tissue swelling above the wrist and moderate degenerative changes, particularly the first CMC joint. The patient was seen in consultation by orthopedist, initially Dr. Rosita KeaMenz, and [facility]. The patient was placed in a Velcro wrist splint for comfort and he did not think there was any obvious radius fracture, both in the left  as well as the right. During his stay in the hospital, the patient also had low-grade fever and blood cultures and urine cultures were done. Urine culture did grow E. coli, but less than 30,000 CFU/mL. He was placed on p.o. Levaquin and his fever gradually resolved. His blood pressure was somewhat elevated and he was on hydralazine, initially at 25 mg p.o. b.i.d. and subsequently increased to 25 mg p.o. t.i.d. The patient was seen by physical therapy. It was felt that he would benefit from rehab. He was also seen by neurologist, Dr. Katrinka BlazingSmith, and started on p.o. Sinemet for possible Parkinson disease. He was discharged in stable condition to skilled nursing on the following medications.  DISCHARGE MEDICATIONS: Metformin 1000 mg p.o. b.i.d., lisinopril 40 mg once a day, glipizide 10 mg once a day, Glucerna shake 237 mL b.i.d., hydralazine 25 mg p.o. t.i.d., Levaquin 500 mg a day for 5 days, aspirin 81 mg a day, carbidopa/levodopa 25/100 one tablet p.o. t.i.d. with meals, insulin detemir 40 units subcutaneous at bedtime and acetaminophen 325 two tablets every 4 to 6 hours p.r.n. for pain or fever.   DISCHARGE INSTRUCTIONS: The patient was advised to follow up with me, Dr. Marcello FennelHande, in 1 to 2 weeks' time. He was advised physical therapy.  CONDITION AT DISCHARGE: He was stable at the time of discharge.   TOTAL TIME SPENT IN DISCHARGING THE PATIENT: 35 minutes.   ____________________________ Barbette ReichmannVishwanath Laconda Basich, MD vh:sb D: 03/20/2014 13:03:46 ET T: 03/20/2014 14:25:37 ET JOB#: [id number]  cc: Barbette ReichmannVishwanath Kannen Moxey, MD, <Dictator> Barbette ReichmannVISHWANATH Azaylah Stailey MD ELECTRONICALLY SIGNED 04/04/2014 17:48

## 2014-08-26 NOTE — Consult Note (Signed)
PATIENT NAME:  Troy [name], Boy S MR#:  [id number] DATE OF BIRTH:  May 22, 1923  DATE OF CONSULTATION:  07/24/2013  REFERRING PHYSICIAN:  Dr. Judithann SheenSparks CONSULTING PHYSICIAN:  Lamar BlinksBruce J. Shahin Knierim, MD  REASON FOR CONSULTATION: Acute on chronic systolic dysfunction, congestive heart failure, chronic atrial fibrillation, coronary artery disease, hypertension, hyperlipidemia, diabetes.   CHIEF COMPLAINT: "I am short of breath."   HISTORY OF PRESENT ILLNESS: This is an 81 year old male with known coronary disease status post coronary artery bypass graft and hypertension, hyperlipidemia, diabetes on appropriate medication management. The patient also has chronic atrial fibrillation, on appropriate medical management with heart rate control at this time. The patient has had new onset significant shortness of breath, weakness and fatigue with any physical activity with pulmonary edema and some lower extremity edema consistent with acute on chronic systolic dysfunction heart failure. After intravenous Lasix the patient has slowly had some improvement  of symptoms. The patient has had no hypoxia this morning. There is a normal troponin and EKG showing atrial fibrillation with preventricular contractions and incomplete right bundle branch block. The remainder review of systems negative for vision change, ringing in the ears, hearing loss, cough, congestion, heartburn, nausea, vomiting, diarrhea, bloody stool, stomach pain, extremity pain, leg weakness, cramping of the buttocks, known blood clots, headaches, blackouts, dizzy spells, nosebleed, congestion, trouble swallowing, frequent urination, urination at night, muscle weakness, numbness, anxiety, depression, skin lesions or skin rashes.   PAST MEDICAL HISTORY: 1. Hypertension.  2. Hyperlipidemia.  3. Chronic atrial fibrillation.  4. Coronary artery disease.  5. Diabetes.   FAMILY HISTORY: No family members with early onset of cardiovascular disease or hypertension.    SOCIAL HISTORY: Currently denies alcohol or tobacco use.   ALLERGIES: As listed.   MEDICATIONS: As listed.   PHYSICAL EXAMINATION: VITAL SIGNS: Blood pressure is 118/62  bilaterally, heart rate is 70 upright, reclining and irregular.  GENERAL: He is a well appearing male in no acute distress.  HEENT: No icterus, thyromegaly, ulcers, hemorrhage, or xanthelasma.  CARDIOVASCULAR: Irregularly irregular. Normal S1 and S2, 1 to 2 out of 6 apical murmur consistent with mitral regurgitation. PMI is inferiorly displaced. Carotid upstroke normal without bruit. Jugular venous pressure is normal.  LUNGS: Have bibasilar crackles with some expiratory wheezes.  ABDOMEN: Soft, nontender without hepatosplenomegaly or masses. Abdominal aorta is normal size without bruit.  EXTREMITIES: 2+ radial, femoral, dorsal pedal pulses with trace to 1+ lower extremity edema. No cyanosis, clubbing or ulcers.  NEUROLOGIC: He is oriented to time, place, and person, with normal mood and affect.   ASSESSMENT: An 81 year old male with acute on chronic systolic dysfunction, congestive heart failure, chronic atrial fibrillation, coronary artery disease, coronary artery bypass graft, hypertension, diabetes and needing further treatment options.   RECOMMENDATIONS: 1. Continue intravenous Lasix for lower extremity edema and pulmonary edema and change to oral as able.  2. Echocardiogram for LV systolic dysfunction changes and adjustments of medications. 3. Calcium channel blocker and carvedilol for heart rate control of atrial fibrillation.  4. Coumadin for goal INR between 2 to 3 for further risk reduction in stroke with atrial fibrillation.  5. No adjustments of diabetic medication management, currently stable.  6. Further ambulation and follow for significant improvements of symptoms and adjustments as necessary.   ____________________________ Lamar BlinksBruce J. Mihcael Ledee, MD bjk:sg D: 07/24/2013 07:41:28 ET T: 07/24/2013 11:44:19  ET JOB#: [id number]  cc: Lamar BlinksBruce J. Akya Fiorello, MD, <Dictator> Lamar BlinksBRUCE J Frazer [place] MD ELECTRONICALLY SIGNED 07/27/2013 12:31

## 2014-08-26 NOTE — Consult Note (Signed)
Brief Consult Note: Diagnosis: right wrist pain, possible gout.   Patient was seen by consultant.   Recommend further assessment or treatment.   Orders entered.   Comments: brace ordered, labs for possible gout vs infection.  Electronic Signatures: Leitha SchullerMenz, Davide Risdon J (MD)  (Signed 11-Nov-15 17:18)  Authored: Brief Consult Note   Last Updated: 11-Nov-15 17:18 by Leitha SchullerMenz, Jermall Isaacson J (MD)

## 2014-08-26 NOTE — Consult Note (Signed)
Brief Consult Note: Diagnosis: Left first CMC arthritis.   Patient was seen by consultant.   Consult note dictated.   Comments: No obvious fracture- presume discomfort secondary to [facility] and wrist arthritis. Wrist splint for comfort, can be removed as tolerated.  Electronic Signatures: Danelle EarthlyEckel, Lynda Capistran T (MD)  (Signed [phone]-Nov-15 08:43)  Authored: Brief Consult Note   Last Updated: 14-Nov-15 08:43 by Danelle EarthlyEckel, Beaux Wedemeyer T (MD)

## 2014-08-26 NOTE — Consult Note (Signed)
Referring Physician:  Lytle Butte   Primary Care Physician:  [facility], [address], [place], [place] [address], [place] [phone]  Reason for Consult: Admit Date: 14-Mar-2014  Chief Complaint: weakness  Reason for Consult: weakness   History of Present Illness: History of Present Illness:   81 yo RHD M presents to [facility] after fall where he hurt is R wrist.  Per family, pt has had a gradual decline in ability to walk mainly over the last 3 weeks.  Prior to this, pt still had falls but was able to do most of his ADLs at home with his wife.  Pt normal talks well per son but has been more confused over the past few days.  He has also been running fevers per son.  ROS:  Review of Systems   difficult to obtain secondary to confusion  Past Medical/Surgical Hx:  Atrial Fibrillation:   Hyperlipidemia:   kidney stones:   HTN:   TIA - Transient Ischemic Attack:   Back Pain, Chronic:   Diabetes Mellitus, Type II (NIDD):   BPH:   Arrythmias:   Tension tremors:   macular degeneration:   CHF:   bladder stimulator:   cholecystectomy:   open heart surg:   knee replacement:   Past Medical/ Surgical Hx:  Past Medical History reviewed by me as above   Past Surgical History reviewed by me as above   Home Medications: Medication Instructions Last Modified Date/Time  metformin 1000 mg oral tablet 1 tab(s) orally 2 times a day  10-Nov-15 21:20  lisinopril 40 mg oral tablet 1 tab(s) orally once a day 10-Nov-15 21:20  acetaminophen-HYDROcodone 325 mg-5 mg oral tablet 1 tab(s) orally 3 times a day, As Needed - for Pain 10-Nov-15 21:20  glipiZIDE 10 mg oral tablet 1 tab(s) orally once a day 10-Nov-15 21:20  Lantus 100 units/mL subcutaneous solution 40 unit(s) subcutaneous once a day (at bedtime) 10-Nov-15 21:20  HumuLIN N human recombinant 100 units/mL subcutaneous suspension 4 unit(s) subcutaneous once a day (in the morning). 10-Nov-15 21:20    Allergies:  No Known Allergies:   Allergies:  Allergies NKDA    Social/Family History: Employment Status: retired  Lives With: spouse  Living Arrangements: house  Social History: no tob, no EtOH, no illicits  Family History: mother with tremors as well, no stroke   Vital Signs:  **Vital Signs.:   12-Nov-15 18:41  Temperature Temperature (F) 99.3  Celsius 37.3    21:42  Vital Signs Type Routine  Temperature Temperature (F) 99.6  Celsius 37.5  Temperature Source oral  Pulse Pulse 121  Respirations Respirations 20  Systolic BP Systolic BP 117  Diastolic BP (mmHg) Diastolic BP (mmHg) 356  Mean BP 130  Pulse Ox % Pulse Ox % 96  Pulse Ox Activity Level  At rest  Oxygen Delivery 2L   Physical Exam: General: ill appearing, nl weight  HEENT: normocephalic, sclera nonicteric, oropharynx clear  Neck: supple, no JVD, no bruits  Chest: CTA B, no wheezing, decent movement  Cardiac: RRR, no murmurs, no edema, 2+ pulses  Extremities: no C/C/E, FROM   Neurologic Exam: Mental Status: sleepy and oriented only to person, mild dysarthria, nl language  Cranial Nerves: PERRLA, EOMI, nl VF, face symmetric with hypomimia, tongue midline, shoulder shrug equal  Motor Exam: 5-/5 B, increased tone, R resting tremor, significant bradykinesia  Deep Tendon Reflexes: 1+/4 B, mute plantars  Sensory Exam: light touch intact  Coordination: F to N,  gait untestable now   Lab Results:  Hepatic:  10-Nov-15 16:04   Bilirubin, Total  1.3  Alkaline Phosphatase 80 (46-116 NOTE: New Reference Range 11/22/13)  SGPT (ALT) 35 (14-63 NOTE: New Reference Range 11/22/13)  SGOT (AST) 34  Total Protein, Serum 8.0  Albumin, Serum 3.6  General Ref:  11-Nov-15 18:30   C-Reactive Protein ========== TEST NAME ==========  ========= RESULTS =========  = REFERENCE RANGE =  C-REACTIVE PROTEIN,QUANT  C-Reactive Protein, Quant C-Reactive Protein, Quant       [H  61.0 mg/L            ]           0.0-4.9                [facility]            No: [id number]           [address], [place], [place] [address]           Lindon Romp, MD         [phone]   Result(s) reported on 16 Mar 2014 at 08:18AM.  Routine Chem:  10-Nov-15 16:04   Magnesium, Serum  1.2 (1.8-2.4 THERAPEUTIC RANGE: 4-7 mg/dL TOXIC: > 10 mg/dL  -----------------------)  12-Nov-15 03:52   Glucose, Serum  119  BUN 13  Creatinine (comp) 0.92  Sodium, Serum 141  Potassium, Serum  3.4  Chloride, Serum 104  CO2, Serum 30  Calcium (Total), Serum  8.1  Anion Gap 7  Osmolality (calc) 283  eGFR (African American) >60  eGFR (Non-African American) >60 (eGFR values <37m/min/1.73 m2 may be an indication of chronic kidney disease (CKD). Calculated eGFR, using the MRDR Study equation, is useful in  patients with stable renal function. The eGFR calculation will not be reliable in acutely ill patients when serum creatinine is changing rapidly. It is not useful in patients on dialysis. The eGFR calculation may not be applicable to patients at the low and high extremes of body sizes, pregnant women, and vegetarians.)  Cardiac:  10-Nov-15 16:04   CK, Total 46 (39-308 NOTE: NEW REFERENCE RANGE  06/06/2013)  CPK-MB, Serum 1.3 (Result(s) reported on 14 Mar 2014 at 06:38PM.)  Troponin I < 0.02 (0.00-0.05 0.05 ng/mL or less: NEGATIVE  Repeat testing in 3-6 hrs  if clinically indicated. >0.05 ng/mL: POTENTIAL  MYOCARDIAL INJURY. Repeat  testing in 3-6 hrs if  clinically indicated. NOTE: An increase or decrease  of 30% or more on serial  testing suggests a  clinically important change)  Routine UA:  10-Nov-15 18:35   Color (UA) Yellow  Clarity (UA) Clear  Glucose (UA) 50 mg/dL  Bilirubin (UA) Negative  Ketones (UA) 1+  Specific Gravity (UA) 1.023  Blood (UA) 1+  pH (UA) 5.0  Protein (UA) 100 mg/dL  Nitrite (UA) Negative  Leukocyte Esterase (UA) Negative (Result(s) reported on 14 Mar 2014 at 06:55PM.)   RBC (UA) 1 /HPF  WBC (UA) 6 /HPF  Bacteria (UA) NONE SEEN  Epithelial Cells (UA) 1 /HPF  Hyaline Cast (UA) 3 /LPF  Amorphous Crystal (UA) PRESENT (Result(s) reported on 14 Mar 2014 at 06:55PM.)  Routine Coag:  10-Nov-15 16:04   Prothrombin 14.2  INR 1.1 (INR reference interval applies to patients on anticoagulant therapy. A single INR therapeutic range for coumarins is not optimal for all indications; however, the suggested range for most indications is 2.0 - 3.0. Exceptions to the INR Reference Range may include: Prosthetic heart valves, acute  myocardial infarction, prevention of myocardial infarction, and combinations of aspirin and anticoagulant. The need for a higher or lower target INR must be assessed individually. Reference: The Pharmacology and Management of the Vitamin K  antagonists: the seventh ACCP Conference on Antithrombotic and Thrombolytic Therapy. IWLNL.8921 Sept:126 (3suppl): N9146842. A HCT value >55% may artifactually increase the PT.  In one study,  the increase was an average of 25%. Reference:  "Effect on Routine and Special Coagulation Testing Values of Citrate Anticoagulant Adjustment in Patients with High HCT Values." American Journal of Clinical Pathology 2006;126:400-405.)  Activated PTT (APTT) 29.4 (A HCT value >55% may artifactually increase the APTT. In one study, the increase was an average of 19%. Reference: "Effect on Routine and Special Coagulation Testing Values of Citrate Anticoagulant Adjustment in Patients with High HCT Values." American Journal of Clinical Pathology 2006;126:400-405.)  Routine Hem:  11-Nov-15 18:30   Erythrocyte Sed Rate  36 (Result(s) reported on 15 Mar 2014 at 07:42PM.)  12-Nov-15 03:52   WBC (CBC) 9.3  RBC (CBC)  4.29  Hemoglobin (CBC) 13.3  Hematocrit (CBC)  39.9  Platelet Count (CBC)  149  MCV 93  MCH 31.1  MCHC 33.4  RDW 13.9  Neutrophil % 71.0  Lymphocyte % 15.6  Monocyte % 10.3  Eosinophil % 2.2   Basophil % 0.9  Neutrophil #  6.6  Lymphocyte # 1.5  Monocyte # 1.0  Eosinophil # 0.2  Basophil # 0.1 (Result(s) reported on 16 Mar 2014 at 04:34AM.)   Radiology Results: CT:    10-Nov-15 17:01, CT Head Without Contrast  CT Head Without Contrast   REASON FOR EXAM:    recent fall, confusion  COMMENTS:       PROCEDURE: CT  - CT HEAD WITHOUT CONTRAST  - Mar 14 2014  5:01PM     CLINICAL DATA:  Pt presents with wife with c/o right wrist pain  since early this morning. pt states he was trying to getout of bed  and slipped down to floor, denies hitting head. pt reports nausea,  vomiting in triage.    EXAM:  CT HEAD WITHOUT CONTRAST    TECHNIQUE:  Contiguous axial images were obtained from the base of the skull  through the vertex without intravenous contrast.    COMPARISON:  07/04/2013    FINDINGS:  Ventricles are normal in configuration. There is ventricular and  sulcal enlargement reflecting moderate to advanced atrophy. Patchy  white matter hypoattenuation is noted consistent with mild to  moderate chronic microvascular ischemic change. There are no  parenchymal masses or mass effect. There is no evidence of a  cortical infarct.    There are no extra-axial masses or abnormal fluid collections.    There is no intracranial hemorrhage.  Soft tissue seen in the anterior nasal cavity, stable. This could  reflect polyps. Sinus cavities are clear as are the mastoid air  cells.    No skull fracture.     IMPRESSION:  1. No acute intracranial abnormalities.  2. Moderate to advanced atrophy.Mild to moderate chronic  microvascular ischemic change. Stable appearance from the prior head  CT.      Electronically Signed    By: Lajean Manes M.D.    On: 03/14/2014 17:15         Verified By: Lasandra Beech, M.D.,   Radiology Impression: Radiology Impression: CT of brain personally reviewed by me and shows moderate atrophy and white matter changes    Impression/Recommendations: Recommendations:   prior notes reviewed  by me  reviewed by me   Encephalopathy-  this is most likely infectious in origin and make the whole exam worse;  r/o other metabolic and nutritional causes Parkinsonism-  likely cause of some weakness; pt had prior issues to becoming sick with tremor and slowness;  there is a tremor that goes in the family and this could definitely contribute to falls Moderate white matter changes-  likely causing some baseline dementia EEG check B12/folate, ammonia, LFTs, TSH agree with broad spectrum antibiotics start Sinemet 25/183m TID start ASA 869mdaily needs PT evaluation will follow  Electronic Signatures: SmJamison NeighborMD)  (Signed [id number])  Authored: REFERRING PHYSICIAN, Primary Care Physician, Consult, History of Present Illness, Review of Systems, PAST MEDICAL/SURGICAL HISTORY, HOME MEDICATIONS, ALLERGIES, Social/Family History, NURSING VITAL SIGNS, Physical Exam-, LAB RESULTS, RADIOLOGY RESULTS, Recommendations   Last Updated: 12-Nov-15 22:20 by SmJamison NeighborMD)

## 2014-08-26 NOTE — H&P (Signed)
PATIENT NAME:  Troy [name], Troy [name] MR#:  [id number] DATE OF BIRTH:  [birth year]  DATE OF ADMISSION:  07/23/2013  REFERRING PHYSICIAN:  Dr. Fanny BienQuale.  FAMILY PHYSICIAN:  Dr. Marcello FennelHande.   REASON FOR ADMISSION:  Acute on chronic congestive heart failure with respiratory distress.   HISTORY OF PRESENT ILLNESS:  The patient is an 81 year old male with long-standing history of chronic atrial fibrillation, coronary artery disease, status post CABG, ischemic cardiomyopathy and chronic systolic congestive heart failure.  Larey SeatFell recently and with rib fractures.  Has been less ambulatory and splinting when he breathes.  Presents to the Emergency Room with worsening shortness of breath where he was found to be hypoxic.  Left pleural effusion was noted with worsening pulmonary edema.  He is now admitted for further evaluation.   PAST MEDICAL HISTORY: 1.  Recent rib fractures.  2.  Chronic atrial fibrillation on anticoagulation.  3.  ASCVD status post CABG.  4.  Ischemic cardiomyopathy.  5.  Benign hypertension.  6.  History of TIAs.  7.  BPH.  8.  Chronic systolic congestive heart failure.  9.  Nephrolithiasis.  10.  Chronic low back pain.  11.  Type 2 diabetes.  12.  Essential tremor.  13.  Macular degeneration.  14.  Status post cholecystectomy.   MEDICATIONS: 1.  Coumadin 9 mg by mouth daily.  2.  Verapamil 120 mg by mouth daily.  3.  Preservation one by mouth twice daily.  4.  Percocet 5/325 1 to 2 by mouth q. 6 hours as needed pain.  5.  Metformin 1000 mg by mouth twice daily.  6.  Lipitor 40 mg by mouth at bedtime.  7.  Zestril 40 mg by mouth daily.  8.  Lasix 20 mg by mouth twice daily.  9.  Glipizide 10 mg by mouth twice daily.  10.  Coreg 12.5 mg by mouth twice daily.   ALLERGIES:  No known drug allergies.   SOCIAL HISTORY:  The patient has a remote history of tobacco abuse.  No history of alcohol abuse.   FAMILY HISTORY:  Positive for coronary artery disease, diabetes, stroke.  Negative  for colon or prostate cancer.     REVIEW OF SYSTEMS:  CONSTITUTIONAL:  No fever or change in weight.  EYES:  No blurred or double vision.  No glaucoma.  EARS, NOSE, THROAT:  No tinnitus or hearing loss.  No nasal discharge or bleeding.  No difficulty swallowing.  RESPIRATORY:  The patient has had minimal cough.  No wheezing or hemoptysis.  Does have painful respiration due to the rib fractures.  CARDIOVASCULAR:  No chest pain.  Positive orthopnea.  No palpitations or syncope.  GASTROINTESTINAL:  No nausea, vomiting, or diarrhea.  No abdominal pain.  No change in bowel habits.  GENITOURINARY:  No dysuria or hematuria.  No incontinence.  ENDOCRINE:  No polyuria or polydipsia.  No heat or cold intolerance.  HEMATOLOGIC:  The patient denies anemia, easy bruising, or bleeding.  LYMPHATIC:  No swollen glands.  MUSCULOSKELETAL:  The patient denies pain in his neck, shoulders, knees or hips.  Does have back pain.  Also complaining of rib pain.  No gout.  NEUROLOGIC:  No numbness or migraines.  Denies stroke or seizures.  PSYCHIATRIC:  The patient denies anxiety, insomnia or depression.   PHYSICAL EXAMINATION: GENERAL:  The patient is in no acute distress.  He is lying flat in the bed.  VITAL SIGNS:  Remarkable for a blood pressure of 100/61, heart rate 76,  respiratory rate of 24, temperature of 98.3 and a sat of 96% on 2 liters.  HEENT:  Normocephalic, atraumatic.  Pupils equally round and reactive to light and accommodation.  Extraocular movements are intact.  Sclerae are not icteric.  Conjunctivae are clear.  Oropharynx is clear.  NECK:  Supple without JVD.  No adenopathy or thyromegaly is noted.  LUNGS:  Reveal basilar crackles with decreased breath sounds at the left base.  No wheezes.  Respiratory effort is normal.  CARDIAC:  Irregularly irregular rhythm.  No significant rubs or gallops.  PMI is nondisplaced.  Chest wall is tender at the site of his rib fractures.  ABDOMEN:  Soft, nontender,  with normoactive bowel sounds.  No organomegaly or masses were appreciated.  No hernias or bruits were noted.  EXTREMITIES:  Revealed 2+ edema without clubbing or cyanosis.  Distal pulses were 2+ bilaterally.  SKIN:  Warm and dry without rash or lesions.  NEUROLOGIC:  Cranial nerves II through XII grossly intact.  Deep tendon reflexes were symmetric.  Motor and sensory exam is nonfocal.  PSYCHIATRIC:  Exam revealed a patient who is alert and oriented to person, place, and time.  He was cooperative and used good judgment.   LABORATORY DATA:  EKG revealed atrial fibrillation at 75 beats per minute with no acute ischemic changes.  Chest x-ray revealed a left effusion versus infiltrate versus compressive atelectasis with pulmonary edema and cardiomegaly.  Glucose was 228, with a BUN of 17, creatinine 1.13 with a sodium of 137 and a potassium of 4.4.  BNP was elevated at 1149.  Troponin was less than 0.02.  White count was 6.4 with a hemoglobin of 13.5.   ASSESSMENT: 1.  Acute on chronic systolic congestive heart failure.  2.  Pleural effusion.  3.  Recent rib fractures.  4.  Chronic atrial fibrillation.  5.  Ischemic cardiomyopathy.  6.  Atherosclerotic cardiovascular disease, status post coronary artery bypass graft.  7.  Obesity.  8.  Venous stasis with peripheral edema.  9.  Type 2 diabetes.   PLAN:  The patient will be admitted to telemetry with IV Lasix, oxygen and DuoNeb SVNs.  We will continue Coumadin and follow his Pro Times closely.  We will use IV morphine as needed for pain control.  We will obtain an echocardiogram and consult cardiology because of his atrial fibrillation and congestive heart failure.  Follow up routine labs and a chest x-ray in the morning.  Wean oxygen as tolerated.  We will obtain a physical therapy consult and a care management consult for discharge planning.  We will follow his sugars with Accu-Cheks before meals and at bedtime and add sliding scale insulin as  needed.  Further treatment and evaluation will depend upon the patient'[name] progress.   Total time spent on this patient was 50 minutes.    ____________________________ Duane Lope Judithann Sheen, MD jds:ea D: 07/23/2013 15:53:42 ET T: 07/23/2013 16:27:10 ET JOB#: [id number]  cc: Duane Lope. Judithann Sheen, MD, <Dictator> Barbette Reichmann, MD JEFFREY Rodena Medin MD ELECTRONICALLY SIGNED 07/23/2013 18:21

## 2014-08-26 NOTE — Consult Note (Signed)
PATIENT NAME:  Troy [name], Troy [name] MR#:  [id number] DATE OF BIRTH:  1923-10-16  DATE OF CONSULTATION:  03/18/2014  REFERRING PHYSICIAN:   CONSULTING PHYSICIAN:  Danelle Earthlyobin T. Eckel, MD  HISTORY OF PRESENT ILLNESS: [name] is an 81 year old Caucasian male with a history of atrial fibrillation, previously on warfarin therapy, coronary artery disease status post CABG, as well as hypertension, insulin requiring diabetes, who was admitted 4 days ago with generalized weakness after a fall from the bed. At that time he was complaining of right wrist pain of which x-rays were negative, but he was placed in Velcro wrist splint for comfort at that time. He today began complaining of left wrist pain. X-rays were obtained and orthopedics was consulted.   PAST MEDICAL HISTORY: As above.   SOCIAL HISTORY: He denies any alcohol or tobacco use. He lives at home and uses a cane for ambulation.   FAMILY HISTORY: Positive for coronary artery disease, as well as diabetes.   ALLERGIES: He has no known drug allergies.   HOME MEDICATIONS: Include Percocet, lisinopril, glipizide, as well as Humulin and Lantus insulin.   REVIEW OF SYSTEMS: He denies any fevers, chills. Denies any blurry vision or double vision. Denies any chest pain, shortness of breath or cough. Denies any nausea, vomiting, or diarrhea.   PHYSICAL EXAMINATION:  GENERAL: He is an alert and responsive 81 year old male in no apparent distress.  EXTREMITIES: Examination of his left upper extremity demonstrates marked tenderness in his [facility], but no tenderness to palpation along the distal radius, and no pain with passive elbow or shoulder range of motion. He is neurovascularly intact distally to that left upper extremity with intact palpable pulses and sensation intact to light touch in all distributions.   RADIOGRAPHS: Radiographs were reviewed of his left wrist which demonstrated pancarpal arthritis, as well as severe arthritis of the first [facility]  joint. No obvious distal radial fracture is seen on the x-ray examination. Right wrist radiographs were also reviewed, which did not show any fractures.   ASSESSMENT: An 81 year old male with pancarpal and first CMC arthritis. The patient was placed in a Velcro wrist splint for comfort. He can be encouraged to remove the splints for eating, hygiene, and other daily activities, as well as for gentle active and passive range of motion as he tolerates. No surgical indications for this patient at this time.     ____________________________ Danelle Earthlyobin T. Eckel, MD tte:at D: 03/18/2014 08:42:01 ET T: 03/18/2014 09:40:05 ET JOB#: [id number]  cc: Danelle Earthlyobin T. Eckel, MD, <Dictator> Danelle EarthlyBIN T ECKEL MD ELECTRONICALLY SIGNED 03/18/2014 14:15

## 2014-08-27 NOTE — Discharge Summary (Signed)
PATIENT NAME:  Troy [name], [name] MR#:  [id number] DATE OF BIRTH:  [birth year]  DATE OF ADMISSION:  08/15/2011 DATE OF DISCHARGE:  08/18/2011  DISCHARGE DIAGNOSES:  1. Acute bronchitis with generalized weakness and difficulty in ambulation.  2. Type 2 diabetes.  3. History of mitral valve replacement and atrial fibrillation.  4. Hypertension.  5. Hyperlipidemia.   CHIEF COMPLAINT: Cough, fever, generalized weakness, difficulty in ambulation.   HISTORY OF PRESENT ILLNESS: Troy [name] is an 81 year old male with a history of type 2 diabetes, atrial fibrillation, and mitral valve repair who presented to the clinic complaining of cough associated with production of brown sputum associated with fever. The patient also reports of a generalized sense of weakness, difficulty in ambulation and his wife in addition reported that he felt clammy and weak and had difficulty in getting in and out of the car.   PAST MEDICAL HISTORY: Coronary artery disease, osteoarthritis, hypertension, obesity, diverticulosis, colonic polyps, benign prostatic hypertrophy, history of spinal stenosis, sleep apnea, macular degeneration, proximal atrial fibrillation and rosacea.  PAST SURGICAL HISTORY: He has had previous basal cell carcinoma excision, left total knee replacement, cholecystectomy, appendectomy, tonsillectomy, adenoidectomy, umbilical hernia repair, cystoscopy, mitral valve repair and septoplasty.  ALLERGIES: He is allergic to Biaxin and Feldene.   HOSPITAL COURSE: The patient was admitted to [facility] and received intravenous fluids. Blood cultures did not reveal any bacterial growth. His sugar was 278. BUN was 10, creatinine 0.97, sodium 137, potassium 3.9, chloride 99, CO2 27, WBC 5.2, hemoglobin 13.7, hematocrit 41.3, platelet count 150. PT was 16.0 and INR was subtherapeutic at 1.3. During his stay in the hospital, the patient received intravenous Levaquin. His  IV fluids and  subsequently discontinued and he was subsequently switched to p.o. Levaquin. He received physical therapy and his weakness did improve to some extent. He did have episodes of hypoglycemia and his Lantus insulin dose was reduced. His chest x-ray did not reveal any infiltrates. The patient made good progress and was stable at the time of discharge. He was discharged in stable condition on the following medications.   DISCHARGE MEDICATIONS:  1. Levaquin 500 mg p.o. daily for one more week.  2. Lantus insulin 55 units subcutaneous at bedtime. 3. NovoLog Flex pen 35 units 3 times a day with every meal.  4. Verapamil 180 mg once a day.  5. Lasix 20 mg every other day. 6. Zyrtec 10 mg daily.  7. Coumadin 6 mg a day.  8. Glyburide 4 mg once a day.  9. Simvastatin 40 mg at bedtime.  10. Metformin 1000 mg b.i.d.  11. Hydrocodone 5/500 1 tablet p.o. t.i.d. p.r.n.   DISCHARGE INSTRUCTIONS: The patient has been advised to follow-up with me, Dr. Marcello FennelHande, in 1 to 2 weeks' time. He was advised to call us if there are any questions or concern. The patient has been advised to continue outpatient physical therapy.   ____________________________ Barbette ReichmannVishwanath Alvino Lechuga, MD vh:rbg D: 08/19/2011 12:39:09 ET T: 08/20/2011 10:42:54 ET JOB#: [id number]  cc: Barbette ReichmannVishwanath Julea Hutto, MD, <Dictator> Barbette ReichmannVISHWANATH Adaleigh Warf MD ELECTRONICALLY SIGNED 08/25/2011 13:08

## 2014-08-27 NOTE — H&P (Signed)
PATIENT NAME:  Troy [name], Troy [name] MR#:  [id number] DATE OF BIRTH:  [birth year]  DATE OF ADMISSION:  08/15/2011  CHIEF COMPLAINT: Cough, productive sputum, fever associated with difficulty in ambulation and generalized weakness.   HISTORY OF PRESENT ILLNESS: Troy [name] is an 81 year old male with a history of type 2 diabetes, mitral valve repair, and previous atrial fibrillation who presents to the clinic complaining of cough associated with productive brown sputum that started yesterday associated with fever. The patient also reports an overall sense of weakness with difficulty in ambulation. He has been feeling somewhat short of breath but denies any chest pain. The patient'[name] wife also reports that he is clammy and weak and requires assistance getting in and out of the car. No vomiting or diarrhea. Sugars have been around 190.   PAST MEDICAL HISTORY:  1. Coronary artery disease.  2. Osteoarthritis.  3. Hypertension.  4. Obesity.  5. Diverticulosis.  6. Colonic polyps.  7. Benign prostatic hypertrophy.  8. History of spinal stenosis.  9. Sleep apnea.  10. Macular degeneration.  11. Paroxysmal atrial fibrillation.  12. Rosacea.  13. Anxiety.  14. Gastroesophageal reflux disease.  15. Type 2 diabetes.   PAST SURGICAL HISTORY:  1. Basal cell carcinoma excision.  2. Left total knee replacement.  3. Cholecystectomy.  4. Appendectomy.  5. Tonsillectomy and adenoidectomy. 6. Umbilical hernia.  7. Cystoscopy.  8. Mitral valve repair. 9. Septoplasty.   ALLERGIES: He is allergic to Biaxin and Feldene.   CURRENT MEDICATIONS:  1. Coumadin 5 mg once a day.  2. Hydrocodone 5/500, 1 tablet p.o. q. 6-8 h. p.r.n.  3. Zyrtec 10 mg p.o. b.i.d.  4. Metformin 1000 mg p.o. b.i.d.  5. PreserVision 2 capsules by mouth once a day.  6. Lasix 20 mg every other day.  7. Simvastatin 40 mg once a day.  8. Calan SR 180 mg once a day.  9. Lantus insulin 65 units at bedtime. 10. NovoLog FlexPen  insulin 35 units t.i.d. with meals.  11. Glipizide 10 mg once a day.   PHYSICAL EXAMINATION:   GENERAL: He was in a wheelchair.  He was obese.  VITAL SIGNS: Febrile to 101, blood pressure was 142/80, pulse 88. Oxygen saturation 94% on room air.   HEENT: Normocephalic, atraumatic. Mucous membranes were dry.   NECK: No thyromegaly.   LUNGS: Rhonchi present bilaterally.   HEART: S1, S2.   ABDOMEN: Soft, obese, nontender.   EXTREMITIES: Evidence of some bruising noted on both arms. Trace edema.   NEUROLOGIC: Alert and oriented times three. No obvious focal signs.   IMPRESSION:  1. Acute bronchitis with generalized weakness, difficulty in ambulation, and fever.  2. Type 2 diabetes on glipizide, metformin, and Lantus insulin with NovoLog t.i.d.  3. Hypertension.  4. Hyperlipidemia.  5. History of mitral valve replacement and atrial fibrillation.   PLAN: The patient was advised [facility] fluids and IV Levaquin. We will check labs including CBC, metabolic panel, PT/INR, and chest x-ray. We will continue current insulin regimen. Continue other home meds.Physical Therapy .  Discussed the plan with the patient and his wife who were both agreeable.    ____________________________ Barbette ReichmannVishwanath Yasin Ducat, MD vh:bjt D: 08/15/2011 12:30:05 ET T: 08/15/2011 13:57:31 ET JOB#: [id number]  cc: Barbette ReichmannVishwanath Celina Shiley, MD, <Dictator> Barbette ReichmannVISHWANATH Aldwin Micalizzi MD ELECTRONICALLY SIGNED 08/25/2011 13:08

## 2014-10-21 ENCOUNTER — Emergency Department: Payer: Medicare Other

## 2014-10-21 ENCOUNTER — Encounter: Payer: Self-pay | Admitting: Emergency Medicine

## 2014-10-21 ENCOUNTER — Other Ambulatory Visit: Payer: Self-pay

## 2014-10-21 ENCOUNTER — Emergency Department
Admission: EM | Admit: 2014-10-21 | Discharge: 2014-10-21 | Disposition: A | Discharge status: Home / Self Care | Payer: Medicare Other | Attending: Emergency Medicine | Admitting: Emergency Medicine

## 2014-10-21 DIAGNOSIS — E119 Type 2 diabetes mellitus without complications: Secondary | ICD-10-CM | POA: Insufficient documentation

## 2014-10-21 DIAGNOSIS — N390 Urinary tract infection, site not specified: Secondary | ICD-10-CM

## 2014-10-21 DIAGNOSIS — R4182 Altered mental status, unspecified: Secondary | ICD-10-CM | POA: Diagnosis present

## 2014-10-21 DIAGNOSIS — I10 Essential (primary) hypertension: Secondary | ICD-10-CM | POA: Diagnosis not present

## 2014-10-21 DIAGNOSIS — Z87891 Personal history of nicotine dependence: Secondary | ICD-10-CM | POA: Diagnosis not present

## 2014-10-21 HISTORY — DX: Urinary tract infection, site not specified: N39.0

## 2014-10-21 HISTORY — DX: Transient cerebral ischemic attack, unspecified: G45.9

## 2014-10-21 HISTORY — DX: Essential (primary) hypertension: I10

## 2014-10-21 HISTORY — DX: Type 2 diabetes mellitus without complications: E11.9

## 2014-10-21 HISTORY — DX: Parkinson's disease without dyskinesia, without mention of fluctuations: G20.A1

## 2014-10-21 HISTORY — DX: Parkinson's disease: G20

## 2014-10-21 LAB — CBC
HCT: 41.8 % (ref 40.0–52.0)
Hemoglobin: 13.8 g/dL (ref 13.0–18.0)
MCH: 30.1 pg (ref 26.0–34.0)
MCHC: 33 g/dL (ref 32.0–36.0)
MCV: 91.2 fL (ref 80.0–100.0)
PLATELETS: 154 10*3/uL (ref 150–440)
RBC: 4.59 MIL/uL (ref 4.40–5.90)
RDW: 14.3 % (ref 11.5–14.5)
WBC: 6.9 10*3/uL (ref 3.8–10.6)

## 2014-10-21 LAB — COMPREHENSIVE METABOLIC PANEL
ALBUMIN: 3.6 g/dL (ref 3.5–5.0)
ALT: 6 U/L — AB (ref 17–63)
AST: 27 U/L (ref 15–41)
Alkaline Phosphatase: 54 U/L (ref 38–126)
Anion gap: 4 — ABNORMAL LOW (ref 5–15)
BILIRUBIN TOTAL: 1.4 mg/dL — AB (ref 0.3–1.2)
BUN: 19 mg/dL (ref 6–20)
CHLORIDE: 108 mmol/L (ref 101–111)
CO2: 26 mmol/L (ref 22–32)
Calcium: 8.5 mg/dL — ABNORMAL LOW (ref 8.9–10.3)
Creatinine, Ser: 1 mg/dL (ref 0.61–1.24)
GFR calc Af Amer: >60 mL/min (ref >60)
GFR calc non Af Amer: >60 mL/min (ref >60)
GLUCOSE: 165 mg/dL — AB (ref 65–99)
Potassium: 3.8 mmol/L (ref 3.5–5.1)
SODIUM: 138 mmol/L (ref 135–145)
Total Protein: 6.9 g/dL (ref 6.5–8.1)

## 2014-10-21 LAB — URINALYSIS COMPLETE WITH MICROSCOPIC (ARMC ONLY)
BACTERIA UA: NONE SEEN
BILIRUBIN URINE: NEGATIVE
GLUCOSE, UA: 150 mg/dL — AB
Nitrite: NEGATIVE
Protein, ur: 100 mg/dL — AB
Specific Gravity, Urine: 1.028 (ref 1.005–1.030)
pH: 5 (ref 5.0–8.0)

## 2014-10-21 LAB — GLUCOSE, CAPILLARY: GLUCOSE-CAPILLARY: 131 mg/dL — AB (ref 65–99)

## 2014-10-21 MED ORDER — CEPHALEXIN 500 MG PO CAPS
ORAL_CAPSULE | ORAL | Status: AC
  Administered 2014-10-21: 500 mg via ORAL
  Filled 2014-10-21: qty 1

## 2014-10-21 MED ORDER — CEPHALEXIN 500 MG PO CAPS: 500.0000 mg | ORAL_CAPSULE | Freq: Four times a day (QID) | ORAL | Status: DC

## 2014-10-21 MED ORDER — CEPHALEXIN 500 MG PO CAPS
500.0000 mg | ORAL_CAPSULE | ORAL | Status: AC
  Administered 2014-10-21: 500 mg via ORAL

## 2014-10-21 NOTE — ED Notes (Signed)
Patient with no complaints at this time. Respirations even and unlabored. Skin warm/dry. Discharge instructions reviewed with patient at this time. Patient given opportunity to voice concerns/ask questions. Patient discharged at this time and left Emergency Department, via wheelchair.   

## 2014-10-21 NOTE — ED Provider Notes (Signed)
[facility] Emergency Department Provider Note  ____________________________________________  Time seen: Approximately 9:05 PM  I have reviewed the triage vital signs and the nursing notes.   HISTORY  Chief Complaint Altered Mental Status    HPI Troy [name] is a 81 y.o. male who is very pleasant and accompanied by his wife and son. The patient comes in because he's been having some slight evidence of confusion since this morning, and his wife has noticed that his urine has had a abnormal odor and he has had a slight amount of difficulty with urination. Wife believes he may have a urinary tract infection as he has had previous and the past.  No chest pain or trouble breathing. No fevers. He seems slightly more confused this morning. His family but seems to be back to his normal per wife and son. Patient does report that he has had some difficulty with urination, but here in the ER with his son he was able to urinate well without difficulty aside from abnormal odor and slight dark color.  He continues to eat and drink and walk around the house without issues. He is alert and oriented now. No chest pain and no trouble breathing. No abdominal pain.  No shortness of breath or cough.   Past Medical History  Diagnosis Date  . Diabetes mellitus without complication   . Hypertension   . UTI (lower urinary tract infection)   . Parkinson's disease   . TIA (transient ischemic attack)   . Atrial fibrillation     There are no active problems to display for this patient.   Past Surgical History  Procedure Laterality Date  . Cardiac surgery    . Cholecystectomy      Current Outpatient Rx  Name  Route  Sig  Dispense  Refill  . cephALEXin (KEFLEX) 500 MG capsule   Oral   Take 1 capsule (500 mg total) by mouth 4 (four) times daily.   40 capsule   0     Allergies Review of patient's allergies indicates no known allergies.  No family history on  file.  Social History History  Substance Use Topics  . Smoking status: Former Games developer  . Smokeless tobacco: Never Used  . Alcohol Use: No    Review of Systems Constitutional: No fever/chills Eyes: No visual changes. ENT: No sore throat. Cardiovascular: Denies chest pain. Respiratory: Denies shortness of breath. Gastrointestinal: No abdominal pain.  No nausea, no vomiting.  No diarrhea.  No constipation. Genitourinary: Slight pain with urinating, slight difficulty initiating but able to fully empty. Musculoskeletal: Negative for back pain. Skin: Negative for rash. Neurological: Negative for headaches, focal weakness or numbness. No weakness in one foot or leg or arm. No facial droop. No slurring of speech.  10-point ROS otherwise negative.  ____________________________________________   PHYSICAL EXAM:  VITAL SIGNS: ED Triage Vitals  Enc Vitals Group     BP 10/21/14 1614 126/67 mmHg     Pulse Rate 10/21/14 1614 72     Resp 10/21/14 1614 18     Temp 10/21/14 1614 98.8 F (37.1 C)     Temp Source 10/21/14 1614 Oral     SpO2 10/21/14 1614 93 %     Weight 10/21/14 1614 216 lb (97.977 kg)     Height 10/21/14 1614  (1.753 m)     Head Cir --      Peak Flow --      Pain Score --  Pain Loc --      Pain Edu? --      Excl. in GC? --     Constitutional: Alert and oriented. Well appearing and in no acute distress. Eyes: Conjunctivae are normal. PERRL. EOMI. Head: Atraumatic. Nose: No congestion/rhinnorhea. Mouth/Throat: Mucous membranes are moist.  Oropharynx non-erythematous. Neck: No stridor.   Cardiovascular: Normal rate, regular rhythm. Grossly normal heart sounds.  Good peripheral circulation. Respiratory: Normal respiratory effort.  No retractions. Lungs CTAB. Gastrointestinal: Soft and nontender. No distention. No abdominal bruits. No CVA tenderness. No groin lesion or mass. Circumcised penis without evidence of erythema or abnormality. Musculoskeletal: No  lower extremity tenderness nor edema.  No joint effusions. Neurologic:  Normal speech and language, he does have a slight Parkinsonian appearance. No gross focal neurologic deficits are appreciated. Speech is normal.  Skin:  Skin is warm, dry and intact. No rash noted. Psychiatric: Mood and affect are normal. Speech and behavior are normal.  ____________________________________________   LABS (all labs ordered are listed, but only abnormal results are displayed)  Labs Reviewed  COMPREHENSIVE METABOLIC PANEL - Abnormal; Notable for the following:    Glucose, Bld 165 (*)    Calcium 8.5 (*)    ALT 6 (*)    Total Bilirubin 1.4 (*)    Anion gap 4 (*)    All other components within normal limits  URINALYSIS COMPLETEWITH MICROSCOPIC ([facility] ONLY) - Abnormal; Notable for the following:    Color, Urine YELLOW (*)    APPearance CLEAR (*)    Glucose, UA 150 (*)    Ketones, ur TRACE (*)    Hgb urine dipstick 2+ (*)    Protein, ur 100 (*)    Leukocytes, UA 1+ (*)    Squamous Epithelial / LPF 0-5 (*)    All other components within normal limits  GLUCOSE, CAPILLARY - Abnormal; Notable for the following:    Glucose-Capillary 131 (*)    All other components within normal limits  URINE CULTURE  CBC  CBG MONITORING, ED   ____________________________________________  EKG  Viewed and interpreted by me. No P waves are easily seen, likely atrial fibrillation with incomplete right bundle. Patient does have a history of atrial fibrillation. Incomplete right bundle-branch block, slight prolonged QT, ventricular rate 82, QRS 98, QTC 504, there is no overt acute ischemic change. No ST elevation. ____________________________________________  RADIOLOGY  No respiratory symptoms. Not hypoxic. Normal lung sounds. ____________________________________________   PROCEDURES  Procedure(s) performed: None  Critical Care performed: No  ____________________________________________   INITIAL  IMPRESSION / ASSESSMENT AND PLAN / ED COURSE  Pertinent labs & imaging results that were available during my care of the patient were reviewed by me and considered in my medical decision making (see chart for details).  Patient presents with urinary symptoms and slight confusion which seemed present this morning. His urinalysis is mildly positive for leukocyte Estrace and nitrites, and given the history of urinary symptoms this would seem to be the most likely cause of his confusion and difficulty with starting and dysuria. He is able to empty her bladder, at this point he still appears to be a good outpatient candidate for treatment. Based on recommendations of Dr. Sampson Goon of infectious disease we will treat him with Keflex 4 times a day for 7 days.  Because of the patient's age, and the confusion family reported earlier today I did discuss with his family close return precautions and will follow up with his primary Dr. Marcello Fennel. Family and patient are agreeable  with treatment plan, they will return if he develops worsening confusion, fatigue, is not acting appropriately, develops fevers, weakness, vomiting, or other new concerns such as inability urinate arise. ____________________________________________   FINAL CLINICAL IMPRESSION(S) / ED DIAGNOSES  Final diagnoses:  Urinary tract infection, acute      Sharyn Creamer, MD 10/21/14 2133

## 2014-10-21 NOTE — ED Notes (Signed)
BIB wife for concerns of altered mental status and questionable UTI. Wife reports he has been more confused today than normal. She states odor and dark color to pts urine and decrease in urine output. Pt denies any pain. Denies fever.

## 2014-10-21 NOTE — Discharge Instructions (Signed)
Urinary Tract Infection  Please follow-up with your doctor Monday. Return to the emergency room right away should your confusion worsen, your vomiting, have abdominal pain, feel you cannot empty her bladder, feel weak, lightheaded, or other new concerns arise.  Urinary tract infections (UTIs) can develop anywhere along your urinary tract. Your urinary tract is your body's drainage system for removing wastes and extra water. Your urinary tract includes two kidneys, two ureters, a bladder, and a urethra. Your kidneys are a pair of bean-shaped organs. Each kidney is about the size of your fist. They are located below your ribs, one on each side of your spine. CAUSES Infections are caused by microbes, which are microscopic organisms, including fungi, viruses, and bacteria. These organisms are so small that they can only be seen through a microscope. Bacteria are the microbes that most commonly cause UTIs. SYMPTOMS  Symptoms of UTIs may vary by age and gender of the patient and by the location of the infection. Symptoms in young women typically include a frequent and intense urge to urinate and a painful, burning feeling in the bladder or urethra during urination. Older women and men are more likely to be tired, shaky, and weak and have muscle aches and abdominal pain. A fever may mean the infection is in your kidneys. Other symptoms of a kidney infection include pain in your back or sides below the ribs, nausea, and vomiting. DIAGNOSIS To diagnose a UTI, your caregiver will ask you about your symptoms. Your caregiver also will ask to provide a urine sample. The urine sample will be tested for bacteria and white blood cells. White blood cells are made by your body to help fight infection. TREATMENT  Typically, UTIs can be treated with medication. Because most UTIs are caused by a bacterial infection, they usually can be treated with the use of antibiotics. The choice of antibiotic and length of treatment  depend on your symptoms and the type of bacteria causing your infection. HOME CARE INSTRUCTIONS  If you were prescribed antibiotics, take them exactly as your caregiver instructs you. Finish the medication even if you feel better after you have only taken some of the medication.  Drink enough water and fluids to keep your urine clear or pale yellow.  Avoid caffeine, tea, and carbonated beverages. They tend to irritate your bladder.  Empty your bladder often. Avoid holding urine for long periods of time.  Empty your bladder before and after sexual intercourse.  After a bowel movement, women should cleanse from front to back. Use each tissue only once. SEEK MEDICAL CARE IF:   You have back pain.  You develop a fever.  Your symptoms do not begin to resolve within 3 days. SEEK IMMEDIATE MEDICAL CARE IF:   You have severe back pain or lower abdominal pain.  You develop chills.  You have nausea or vomiting.  You have continued burning or discomfort with urination. MAKE SURE YOU:   Understand these instructions.  Will watch your condition.  Will get help right away if you are not doing well or get worse. Document Released: 01/29/2005 Document Revised: 10/21/2011 Document Reviewed: 05/30/2011 [facility] [place] Patient Information 2015 [place], [place]. This information is not intended to replace advice given to you by your health care provider. Make sure you discuss any questions you have with your health care provider.

## 2014-10-24 LAB — URINE CULTURE: Special Requests: NORMAL

## 2015-02-21 ENCOUNTER — Encounter: Payer: Self-pay | Admitting: Emergency Medicine

## 2015-02-21 ENCOUNTER — Emergency Department
Admission: EM | Admit: 2015-02-21 | Discharge: 2015-02-21 | Disposition: A | Discharge status: Home / Self Care | Payer: Medicare Other | Attending: Emergency Medicine | Admitting: Emergency Medicine

## 2015-02-21 ENCOUNTER — Emergency Department: Payer: Medicare Other

## 2015-02-21 DIAGNOSIS — I10 Essential (primary) hypertension: Secondary | ICD-10-CM | POA: Insufficient documentation

## 2015-02-21 DIAGNOSIS — Z7901 Long term (current) use of anticoagulants: Secondary | ICD-10-CM | POA: Diagnosis not present

## 2015-02-21 DIAGNOSIS — E119 Type 2 diabetes mellitus without complications: Secondary | ICD-10-CM | POA: Insufficient documentation

## 2015-02-21 DIAGNOSIS — R531 Weakness: Secondary | ICD-10-CM

## 2015-02-21 DIAGNOSIS — Z79899 Other long term (current) drug therapy: Secondary | ICD-10-CM | POA: Diagnosis not present

## 2015-02-21 DIAGNOSIS — Z87891 Personal history of nicotine dependence: Secondary | ICD-10-CM | POA: Insufficient documentation

## 2015-02-21 DIAGNOSIS — N390 Urinary tract infection, site not specified: Secondary | ICD-10-CM | POA: Diagnosis not present

## 2015-02-21 LAB — URINALYSIS COMPLETE WITH MICROSCOPIC (ARMC ONLY)
BACTERIA UA: NONE SEEN
BILIRUBIN URINE: NEGATIVE
Glucose, UA: 50 mg/dL — AB
Nitrite: NEGATIVE
PROTEIN: 100 mg/dL — AB
Specific Gravity, Urine: 1.021 (ref 1.005–1.030)
Squamous Epithelial / LPF: NONE SEEN
pH: 6 (ref 5.0–8.0)

## 2015-02-21 LAB — CBC
HEMATOCRIT: 42.9 % (ref 40.0–52.0)
HEMOGLOBIN: 14.2 g/dL (ref 13.0–18.0)
MCH: 30.3 pg (ref 26.0–34.0)
MCHC: 33.1 g/dL (ref 32.0–36.0)
MCV: 91.5 fL (ref 80.0–100.0)
Platelets: 149 10*3/uL — ABNORMAL LOW (ref 150–440)
RBC: 4.68 MIL/uL (ref 4.40–5.90)
RDW: 13.4 % (ref 11.5–14.5)
WBC: 7.6 10*3/uL (ref 3.8–10.6)

## 2015-02-21 LAB — BASIC METABOLIC PANEL
ANION GAP: 8 (ref 5–15)
BUN: 15 mg/dL (ref 6–20)
CALCIUM: 8.9 mg/dL (ref 8.9–10.3)
CHLORIDE: 102 mmol/L (ref 101–111)
CO2: 30 mmol/L (ref 22–32)
Creatinine, Ser: 0.85 mg/dL (ref 0.61–1.24)
GFR calc non Af Amer: >60 mL/min (ref >60)
GLUCOSE: 195 mg/dL — AB (ref 65–99)
POTASSIUM: 4.2 mmol/L (ref 3.5–5.1)
Sodium: 140 mmol/L (ref 135–145)

## 2015-02-21 LAB — GLUCOSE, CAPILLARY: Glucose-Capillary: 162 mg/dL — ABNORMAL HIGH (ref 65–99)

## 2015-02-21 LAB — PROTIME-INR
INR: 2.57
Prothrombin Time: 27.7 seconds — ABNORMAL HIGH (ref 11.4–15.0)

## 2015-02-21 MED ORDER — GLIPIZIDE 10 MG PO TABS
10.0000 mg | ORAL_TABLET | Freq: Once | ORAL | Status: AC
  Administered 2015-02-21: 10 mg via ORAL
  Filled 2015-02-21: qty 1

## 2015-02-21 MED ORDER — FUROSEMIDE 40 MG PO TABS
20.0000 mg | ORAL_TABLET | Freq: Once | ORAL | Status: AC
  Administered 2015-02-21: 20 mg via ORAL
  Filled 2015-02-21: qty 1

## 2015-02-21 MED ORDER — INSULIN ASPART 100 UNIT/ML ~~LOC~~ SOLN
4.0000 [IU] | Freq: Once | SUBCUTANEOUS | Status: AC
  Administered 2015-02-21: 4 [IU] via SUBCUTANEOUS
  Filled 2015-02-21: qty 4

## 2015-02-21 MED ORDER — METOPROLOL TARTRATE 25 MG PO TABS
25.0000 mg | ORAL_TABLET | Freq: Once | ORAL | Status: AC
  Administered 2015-02-21: 25 mg via ORAL
  Filled 2015-02-21: qty 1

## 2015-02-21 MED ORDER — CEPHALEXIN 500 MG PO CAPS: 500.0000 mg | ORAL_CAPSULE | Freq: Three times a day (TID) | ORAL | Status: DC

## 2015-02-21 MED ORDER — CEPHALEXIN 500 MG PO CAPS
500.0000 mg | ORAL_CAPSULE | Freq: Once | ORAL | Status: AC
  Administered 2015-02-21: 500 mg via ORAL
  Filled 2015-02-21: qty 1

## 2015-02-21 NOTE — ED Provider Notes (Signed)
[facility] Emergency Department Provider Note  ____________________________________________  Time seen: Transfer 20 5 AM  I have reviewed the triage vital signs and the nursing notes.   HISTORY  Chief Complaint Weakness  decreased ability to ambulate.    HPI Dannielle BurnRobert S Rashid is a 81 y.o. male who lives at home with close attention from his wife and a caregiver, [name]. The wife noticed that he has been increasingly weak since Sunday. They've also noticed a strong spell to his undergarment and urine. He has also had a slight cough. A home health nurse noted that he had some very mild crackles in the left base approximately a week ago. The wife reports she's also had increased secretions, with a large amount of thin mucous like secretions that he is spitting out.  Patient is alert and communicative. He denies any pain, shortness of breath, but does confirm some general weakness. He reports he feels "wonderful" overall.  Patient does have a history of hypertension and diabetes. He is also on Coumadin. The patient's wife and caregiver chose to not give any medications this morning until he was medically evaluated.    Past Medical History  Diagnosis Date  . Diabetes mellitus without complication (HCC)   . Hypertension   . UTI (lower urinary tract infection)   . Parkinson's disease (HCC)   . TIA (transient ischemic attack)   . Atrial fibrillation (HCC)     There are no active problems to display for this patient.   Past Surgical History  Procedure Laterality Date  . Cardiac surgery    . Cholecystectomy      Current Outpatient Rx  Name  Route  Sig  Dispense  Refill  . atorvastatin (LIPITOR) 40 MG tablet   Oral   Take 40 mg by mouth daily.         . carbidopa-levodopa (PARCOPA) 25-250 MG disintegrating tablet   Oral   Take 1 tablet by mouth 3 (three) times daily.         [name]. glipiZIDE (GLUCOTROL XL) 10 MG 24 hr tablet   Oral   Take 10 mg by  mouth daily with breakfast.         . lisinopril (PRINIVIL,ZESTRIL) 40 MG tablet   Oral   Take 40 mg by mouth daily.         . metFORMIN (GLUCOPHAGE) 1000 MG tablet   Oral   Take 1,000 mg by mouth 2 (two) times daily with a meal.         . metoprolol succinate (TOPROL-XL) 50 MG 24 hr tablet   Oral   Take 50 mg by mouth daily. Take with or immediately following a meal.         . warfarin (COUMADIN) 6 MG tablet   Oral   Take 6 mg by mouth daily.         . cephALEXin (KEFLEX) 500 MG capsule   Oral   Take 1 capsule (500 mg total) by mouth 3 (three) times daily.   21 capsule   0     Allergies Review of patient's allergies indicates no known allergies.  No family history on file.  Social History Social History  Substance Use Topics  . Smoking status: Former Games developermoker  . Smokeless tobacco: Never Used  . Alcohol Use: No    Review of Systems  Constitutional: Negative for fever. Notable for general weakness and decreased ambulation. ENT: Negative for sore throat. Cardiovascular: Negative for chest pain. Respiratory: Negative  for cough. Gastrointestinal: Negative for abdominal pain, vomiting and diarrhea. Genitourinary: Negative for dysuria. Musculoskeletal: No myalgias or injuries. Skin: Negative for rash. Neurological: Negative for paresthesia or weakness   10-point ROS otherwise negative.  ____________________________________________   PHYSICAL EXAM:  VITAL SIGNS: ED Triage Vitals  Enc Vitals Group     BP 02/21/15 1000 181/91 mmHg     Pulse Rate 02/21/15 1000 96     Resp 02/21/15 1000 20     Temp 02/21/15 1001 98.3 F (36.8 C)     Temp Source 02/21/15 1001 Oral     SpO2 02/21/15 1000 94 %     Weight 02/21/15 0959 227 lb 15.3 oz (103.4 kg)     Height 02/21/15 0959 5' 9.5" (1.765 m)     Head Cir --      Peak Flow --      Pain Score --      Pain Loc --      Pain Edu? --      Excl. in GC? --     Constitutional:  Alert, interactive and  communicative.. Well appearing and in no distress. ENT   Head: Normocephalic and atraumatic.   Nose: No congestion/rhinnorhea.    Cardiovascular: Normal rate, regular rhythm, no murmur noted Respiratory:  Normal respiratory effort, no tachypnea.    Breath sounds are clear and equal bilaterally.  Gastrointestinal: Soft and nontender. No distention.  Back: No muscle spasm, no tenderness, no CVA tenderness. Musculoskeletal: No deformity noted. Nontender with normal range of motion in all extremities.  No noted edema. Neurologic:  Normal speech and language, though some of the history appears limited. He defers to his wife and caregiver for some of the history. Equal grip strength bilaterally. 5 or 5 strength in the arms with 4-5 over 5 strength in both legs. Skin:  Skin is warm, dry. No rash noted. Psychiatric:  Speech and behavior are normal.  ____________________________________________    LABS (pertinent positives/negatives)  Labs Reviewed  BASIC METABOLIC PANEL - Abnormal; Notable for the following:    Glucose, Bld 195 (*)    All other components within normal limits  CBC - Abnormal; Notable for the following:    Platelets 149 (*)    All other components within normal limits  URINALYSIS COMPLETEWITH MICROSCOPIC ([facility] ONLY) - Abnormal; Notable for the following:    Color, Urine YELLOW (*)    APPearance CLEAR (*)    Glucose, UA 50 (*)    Ketones, ur TRACE (*)    Hgb urine dipstick 2+ (*)    Protein, ur 100 (*)    Leukocytes, UA TRACE (*)    All other components within normal limits  GLUCOSE, CAPILLARY - Abnormal; Notable for the following:    Glucose-Capillary 162 (*)    All other components within normal limits  PROTIME-INR - Abnormal; Notable for the following:    Prothrombin Time 27.7 (*)    All other components within normal limits  CBG MONITORING, ED     ____________________________________________   EKG  ED ECG REPORT I, Kemora Pinard W, the attending  physician, personally viewed and interpreted this ECG.   Date: 02/21/2015  EKG Time: 10 AM  Rate: 97  Rhythm:Atrial fibrillation    Axis: Normal  Intervals: Normal  ST&T Change: None  ____________________________________________    RADIOLOGY  Chest x-ray: FINDINGS: The cardiac silhouette remains moderately enlarged. Sequelae of prior mitral valve repair are again identified. There is pulmonary vascular congestion with mild interstitial densities bilaterally, less than  on the prior radiograph. No pleural effusion or pneumothorax is identified. No acute osseous abnormality is identified.  IMPRESSION: Cardiomegaly with mild interstitial edema. ____________________________________________  ____________________________________________   INITIAL IMPRESSION / ASSESSMENT AND PLAN / ED COURSE  Pertinent labs & imaging results that were available during my care of the patient were reviewed by me and considered in my medical decision making (see chart for details).  Overall well-appearing 81 year old male but with increased weakness. We will check a urinalysis and chest x-ray along with basic labs.  ----------------------------------------- 12:46 PM on 02/21/2015 -----------------------------------------   Patient looks stable and overall well. His blood pressure has improved to 148/96. His heart rate is reasonable. His blood tests overall look good. He does have a urine sample with 6-30 white blood cells per field.  I discussed the findings with the patient and his wife and caregiver. We will initiate Keflex. The family is comfortable having him return home. I have asked him to follow with his regular physician, Dr.Hande, at [facility] clinic. ____________________________________________   FINAL CLINICAL IMPRESSION(S) / ED DIAGNOSES  Final diagnoses:  General weakness  UTI (lower urinary tract infection)       Darien Ramus, MD 02/21/15 1308

## 2015-02-21 NOTE — ED Notes (Addendum)
EMS pt from home , progressive weakness x2 days , AMS, with frequent hx of UTI's , pt arrives alert and oriented x4, on home O2 at 2 liters at night only , EMS applied O2 during transport for a sat of 91%

## 2015-02-21 NOTE — Discharge Instructions (Signed)
Overall, blood tests and chest x-ray to look good. Urine did show some white blood cells consistent with a possible urinary tract infection. Take Keflex as prescribed. Follow-up with your regular doctor. Continue usual medicines at home. Return to the emergency department if there is increasing weakness, fever, or other urgent concerns.  Weakness Weakness is a lack of strength. You may feel weak all over your body or just in one part of your body. Weakness can be serious. In some cases, you may need more medical tests. HOME CARE  Rest.  Eat a well-balanced diet.  Try to exercise every day.  Only take medicines as told by your doctor. GET HELP RIGHT AWAY IF:   You cannot do your normal daily activities.  You cannot walk up and down stairs, or you feel very tired when you do so.  You have shortness of breath or chest pain.  You have trouble moving parts of your body.  You have weakness in only one body part or on only one side of the body.  You have a fever.  You have trouble speaking or swallowing.  You cannot control when you pee (urinate) or poop (bowel movement).  You have black or bloody throw up (vomit) or poop.  Your weakness gets worse or spreads to other body parts.  You have new aches or pains. MAKE SURE YOU:   Understand these instructions.  Will watch your condition.  Will get help right away if you are not doing well or get worse.   This information is not intended to replace advice given to you by your health care provider. Make sure you discuss any questions you have with your health care provider.   Document Released: 04/03/2008 Document Revised: 10/21/2011 Document Reviewed: 06/20/2011 Elsevier Interactive Patient Education [employer]2016 Elsevier Inc.

## 2015-02-22 ENCOUNTER — Other Ambulatory Visit: Payer: Self-pay | Admitting: Internal Medicine

## 2015-02-22 ENCOUNTER — Ambulatory Visit
Admission: RE | Admit: 2015-02-22 | Discharge: 2015-02-22 | Disposition: A | Discharge status: Home / Self Care | Payer: Medicare Other | Source: Ambulatory Visit | Attending: Internal Medicine | Admitting: Internal Medicine

## 2015-02-22 DIAGNOSIS — G319 Degenerative disease of nervous system, unspecified: Secondary | ICD-10-CM | POA: Insufficient documentation

## 2015-02-22 DIAGNOSIS — J339 Nasal polyp, unspecified: Secondary | ICD-10-CM | POA: Diagnosis not present

## 2015-02-22 DIAGNOSIS — R262 Difficulty in walking, not elsewhere classified: Secondary | ICD-10-CM

## 2015-02-22 DIAGNOSIS — M6281 Muscle weakness (generalized): Secondary | ICD-10-CM

## 2015-02-25 ENCOUNTER — Emergency Department: Payer: Medicare Other

## 2015-02-25 ENCOUNTER — Observation Stay
Admission: EM | Admit: 2015-02-25 | Discharge: 2015-03-01 | Disposition: A | Discharge status: Skilled Nursing Facility | Payer: Medicare Other | Attending: Internal Medicine | Admitting: Internal Medicine

## 2015-02-25 DIAGNOSIS — N390 Urinary tract infection, site not specified: Secondary | ICD-10-CM | POA: Diagnosis not present

## 2015-02-25 DIAGNOSIS — Z8673 Personal history of transient ischemic attack (TIA), and cerebral infarction without residual deficits: Secondary | ICD-10-CM | POA: Insufficient documentation

## 2015-02-25 DIAGNOSIS — Z823 Family history of stroke: Secondary | ICD-10-CM | POA: Diagnosis not present

## 2015-02-25 DIAGNOSIS — Z8249 Family history of ischemic heart disease and other diseases of the circulatory system: Secondary | ICD-10-CM | POA: Diagnosis not present

## 2015-02-25 DIAGNOSIS — Z79899 Other long term (current) drug therapy: Secondary | ICD-10-CM | POA: Insufficient documentation

## 2015-02-25 DIAGNOSIS — Z7984 Long term (current) use of oral hypoglycemic drugs: Secondary | ICD-10-CM | POA: Diagnosis not present

## 2015-02-25 DIAGNOSIS — Z9889 Other specified postprocedural states: Secondary | ICD-10-CM | POA: Diagnosis not present

## 2015-02-25 DIAGNOSIS — R410 Disorientation, unspecified: Secondary | ICD-10-CM

## 2015-02-25 DIAGNOSIS — I110 Hypertensive heart disease with heart failure: Secondary | ICD-10-CM | POA: Diagnosis not present

## 2015-02-25 DIAGNOSIS — R0902 Hypoxemia: Secondary | ICD-10-CM | POA: Diagnosis not present

## 2015-02-25 DIAGNOSIS — E1065 Type 1 diabetes mellitus with hyperglycemia: Secondary | ICD-10-CM

## 2015-02-25 DIAGNOSIS — R05 Cough: Secondary | ICD-10-CM | POA: Diagnosis not present

## 2015-02-25 DIAGNOSIS — G20 Parkinson's disease: Secondary | ICD-10-CM | POA: Insufficient documentation

## 2015-02-25 DIAGNOSIS — N281 Cyst of kidney, acquired: Secondary | ICD-10-CM | POA: Diagnosis not present

## 2015-02-25 DIAGNOSIS — I5033 Acute on chronic diastolic (congestive) heart failure: Secondary | ICD-10-CM | POA: Diagnosis not present

## 2015-02-25 DIAGNOSIS — Z952 Presence of prosthetic heart valve: Secondary | ICD-10-CM | POA: Insufficient documentation

## 2015-02-25 DIAGNOSIS — R531 Weakness: Principal | ICD-10-CM | POA: Insufficient documentation

## 2015-02-25 DIAGNOSIS — Z807 Family history of other malignant neoplasms of lymphoid, hematopoietic and related tissues: Secondary | ICD-10-CM | POA: Insufficient documentation

## 2015-02-25 DIAGNOSIS — I517 Cardiomegaly: Secondary | ICD-10-CM | POA: Diagnosis not present

## 2015-02-25 DIAGNOSIS — Z836 Family history of other diseases of the respiratory system: Secondary | ICD-10-CM | POA: Diagnosis not present

## 2015-02-25 DIAGNOSIS — Z87891 Personal history of nicotine dependence: Secondary | ICD-10-CM | POA: Insufficient documentation

## 2015-02-25 DIAGNOSIS — E876 Hypokalemia: Secondary | ICD-10-CM | POA: Insufficient documentation

## 2015-02-25 DIAGNOSIS — R0989 Other specified symptoms and signs involving the circulatory and respiratory systems: Secondary | ICD-10-CM | POA: Insufficient documentation

## 2015-02-25 DIAGNOSIS — E1165 Type 2 diabetes mellitus with hyperglycemia: Secondary | ICD-10-CM | POA: Insufficient documentation

## 2015-02-25 DIAGNOSIS — J339 Nasal polyp, unspecified: Secondary | ICD-10-CM | POA: Diagnosis not present

## 2015-02-25 DIAGNOSIS — R4182 Altered mental status, unspecified: Secondary | ICD-10-CM | POA: Insufficient documentation

## 2015-02-25 DIAGNOSIS — Z9049 Acquired absence of other specified parts of digestive tract: Secondary | ICD-10-CM | POA: Insufficient documentation

## 2015-02-25 DIAGNOSIS — Z7901 Long term (current) use of anticoagulants: Secondary | ICD-10-CM | POA: Insufficient documentation

## 2015-02-25 DIAGNOSIS — E1140 Type 2 diabetes mellitus with diabetic neuropathy, unspecified: Secondary | ICD-10-CM | POA: Insufficient documentation

## 2015-02-25 DIAGNOSIS — R7989 Other specified abnormal findings of blood chemistry: Secondary | ICD-10-CM

## 2015-02-25 DIAGNOSIS — R29898 Other symptoms and signs involving the musculoskeletal system: Secondary | ICD-10-CM | POA: Diagnosis present

## 2015-02-25 DIAGNOSIS — I482 Chronic atrial fibrillation: Secondary | ICD-10-CM | POA: Diagnosis not present

## 2015-02-25 DIAGNOSIS — Z794 Long term (current) use of insulin: Secondary | ICD-10-CM | POA: Diagnosis not present

## 2015-02-25 DIAGNOSIS — E039 Hypothyroidism, unspecified: Secondary | ICD-10-CM | POA: Diagnosis not present

## 2015-02-25 HISTORY — DX: Hypothyroidism, unspecified: E03.9

## 2015-02-25 HISTORY — DX: Cerebral infarction, unspecified: I63.9

## 2015-02-25 LAB — BLOOD GAS, ARTERIAL
ACID-BASE EXCESS: 4.5 mmol/L — AB (ref 0.0–3.0)
Allens test (pass/fail): POSITIVE — AB
Bicarbonate: 29.8 mEq/L — ABNORMAL HIGH (ref 21.0–28.0)
FIO2: 0.28
O2 SAT: 93.9 %
PCO2 ART: 46 mmHg (ref 32.0–48.0)
PO2 ART: 69 mmHg — AB (ref 83.0–108.0)
Patient temperature: 37
pH, Arterial: 7.42 (ref 7.350–7.450)

## 2015-02-25 LAB — URINALYSIS COMPLETE WITH MICROSCOPIC (ARMC ONLY)
BACTERIA UA: NONE SEEN
Bilirubin Urine: NEGATIVE
Leukocytes, UA: NEGATIVE
NITRITE: NEGATIVE
PROTEIN: 100 mg/dL — AB
SQUAMOUS EPITHELIAL / LPF: NONE SEEN
Specific Gravity, Urine: 1.023 (ref 1.005–1.030)
pH: 5 (ref 5.0–8.0)

## 2015-02-25 LAB — COMPREHENSIVE METABOLIC PANEL
ALBUMIN: 3 g/dL — AB (ref 3.5–5.0)
ALK PHOS: 69 U/L (ref 38–126)
ALT: 6 U/L — ABNORMAL LOW (ref 17–63)
ANION GAP: 11 (ref 5–15)
AST: 23 U/L (ref 15–41)
BUN: 17 mg/dL (ref 6–20)
CO2: 28 mmol/L (ref 22–32)
Calcium: 8.5 mg/dL — ABNORMAL LOW (ref 8.9–10.3)
Chloride: 98 mmol/L — ABNORMAL LOW (ref 101–111)
Creatinine, Ser: 1.13 mg/dL (ref 0.61–1.24)
GFR calc Af Amer: >60 mL/min (ref >60)
GFR calc non Af Amer: 56 mL/min — ABNORMAL LOW (ref >60)
GLUCOSE: 427 mg/dL — AB (ref 65–99)
POTASSIUM: 3.4 mmol/L — AB (ref 3.5–5.1)
SODIUM: 137 mmol/L (ref 135–145)
TOTAL PROTEIN: 6.9 g/dL (ref 6.5–8.1)
Total Bilirubin: 1.5 mg/dL — ABNORMAL HIGH (ref 0.3–1.2)

## 2015-02-25 LAB — CBC
HEMATOCRIT: 39.4 % — AB (ref 40.0–52.0)
HEMOGLOBIN: 12.9 g/dL — AB (ref 13.0–18.0)
MCH: 29.9 pg (ref 26.0–34.0)
MCHC: 32.8 g/dL (ref 32.0–36.0)
MCV: 91.1 fL (ref 80.0–100.0)
Platelets: 192 10*3/uL (ref 150–440)
RBC: 4.33 MIL/uL — ABNORMAL LOW (ref 4.40–5.90)
RDW: 13.4 % (ref 11.5–14.5)
WBC: 8.8 10*3/uL (ref 3.8–10.6)

## 2015-02-25 LAB — GLUCOSE, CAPILLARY
GLUCOSE-CAPILLARY: 234 mg/dL — AB (ref 65–99)
GLUCOSE-CAPILLARY: 238 mg/dL — AB (ref 65–99)
GLUCOSE-CAPILLARY: 238 mg/dL — AB (ref 65–99)

## 2015-02-25 LAB — LACTIC ACID, PLASMA
LACTIC ACID, VENOUS: 2.7 mmol/L — AB (ref 0.5–2.0)
LACTIC ACID, VENOUS: 2.2 mmol/L — AB (ref 0.5–2.0)

## 2015-02-25 MED ORDER — LISINOPRIL 20 MG PO TABS
40.0000 mg | ORAL_TABLET | Freq: Every day | ORAL | Status: DC
  Administered 2015-02-25 – 2015-03-01 (×5): 40 mg via ORAL
  Filled 2015-02-25 (×5): qty 2

## 2015-02-25 MED ORDER — INSULIN ASPART 100 UNIT/ML ~~LOC~~ SOLN
0.0000 [IU] | Freq: Every day | SUBCUTANEOUS | Status: DC
  Administered 2015-02-25 – 2015-02-26 (×2): 2 [IU] via SUBCUTANEOUS
  Administered 2015-02-27 – 2015-02-28 (×2): 3 [IU] via SUBCUTANEOUS
  Filled 2015-02-25: qty 3
  Filled 2015-02-25 (×2): qty 2

## 2015-02-25 MED ORDER — ONDANSETRON HCL 4 MG/2ML IJ SOLN: 4.0000 mg | Freq: Four times a day (QID) | INTRAMUSCULAR | Status: DC | PRN

## 2015-02-25 MED ORDER — POLYETHYLENE GLYCOL 3350 17 G PO PACK: 17.0000 g | PACK | Freq: Every day | ORAL | Status: DC | PRN

## 2015-02-25 MED ORDER — CARBIDOPA-LEVODOPA 25-250 MG PO TBDP: 1.0000 | ORAL_TABLET | Freq: Three times a day (TID) | ORAL | Status: DC

## 2015-02-25 MED ORDER — METOPROLOL SUCCINATE ER 50 MG PO TB24
50.0000 mg | ORAL_TABLET | Freq: Every day | ORAL | Status: DC
  Administered 2015-02-25 – 2015-03-01 (×5): 50 mg via ORAL
  Filled 2015-02-25 (×5): qty 1

## 2015-02-25 MED ORDER — IOHEXOL 300 MG/ML  SOLN
125.0000 mL | Freq: Once | INTRAMUSCULAR | Status: AC | PRN
  Administered 2015-02-25: 125 mL via INTRAVENOUS

## 2015-02-25 MED ORDER — INSULIN ASPART 100 UNIT/ML ~~LOC~~ SOLN
6.0000 [IU] | Freq: Once | SUBCUTANEOUS | Status: AC
Start: 2015-02-25 — End: 2015-02-25
  Administered 2015-02-25: 6 [IU] via SUBCUTANEOUS
  Filled 2015-02-25: qty 6

## 2015-02-25 MED ORDER — ACETAMINOPHEN 650 MG RE SUPP
650.0000 mg | Freq: Four times a day (QID) | RECTAL | Status: DC | PRN
Start: 2015-02-25 — End: 2015-03-01

## 2015-02-25 MED ORDER — SODIUM CHLORIDE 0.9 % IV BOLUS (SEPSIS)
1000.0000 mL | Freq: Once | INTRAVENOUS | Status: AC
  Administered 2015-02-25: 1000 mL via INTRAVENOUS

## 2015-02-25 MED ORDER — ONDANSETRON HCL 4 MG PO TABS: 4.0000 mg | ORAL_TABLET | Freq: Four times a day (QID) | ORAL | Status: DC | PRN

## 2015-02-25 MED ORDER — CARBIDOPA-LEVODOPA 25-250 MG PO TABS
1.0000 | ORAL_TABLET | Freq: Three times a day (TID) | ORAL | Status: DC
  Administered 2015-02-25 – 2015-03-01 (×11): 1 via ORAL
  Filled 2015-02-25 (×12): qty 1

## 2015-02-25 MED ORDER — IOHEXOL 240 MG/ML SOLN
25.0000 mL | Freq: Once | INTRAMUSCULAR | Status: AC | PRN
  Administered 2015-02-25: 25 mL via ORAL

## 2015-02-25 MED ORDER — INSULIN ASPART 100 UNIT/ML ~~LOC~~ SOLN
0.0000 [IU] | Freq: Three times a day (TID) | SUBCUTANEOUS | Status: DC
  Administered 2015-02-26: 3 [IU] via SUBCUTANEOUS
  Administered 2015-02-26: 5 [IU] via SUBCUTANEOUS
  Administered 2015-02-26: 3 [IU] via SUBCUTANEOUS
  Administered 2015-02-27: 7 [IU] via SUBCUTANEOUS
  Administered 2015-02-27: 5 [IU] via SUBCUTANEOUS
  Administered 2015-02-27: 3 [IU] via SUBCUTANEOUS
  Administered 2015-02-28: 5 [IU] via SUBCUTANEOUS
  Administered 2015-02-28: 3 [IU] via SUBCUTANEOUS
  Administered 2015-02-28 – 2015-03-01 (×2): 5 [IU] via SUBCUTANEOUS
  Administered 2015-03-01: 3 [IU] via SUBCUTANEOUS
  Filled 2015-02-25: qty 7
  Filled 2015-02-25: qty 5
  Filled 2015-02-25 (×2): qty 3
  Filled 2015-02-25: qty 5
  Filled 2015-02-25: qty 3
  Filled 2015-02-25: qty 5
  Filled 2015-02-25 (×2): qty 3
  Filled 2015-02-25: qty 5
  Filled 2015-02-25: qty 3
  Filled 2015-02-25: qty 5

## 2015-02-25 MED ORDER — ACETAMINOPHEN 325 MG PO TABS: 650.0000 mg | ORAL_TABLET | Freq: Four times a day (QID) | ORAL | Status: DC | PRN

## 2015-02-25 MED ORDER — FUROSEMIDE 20 MG PO TABS
20.0000 mg | ORAL_TABLET | Freq: Every day | ORAL | Status: DC
  Administered 2015-02-26 – 2015-03-01 (×4): 20 mg via ORAL
  Filled 2015-02-25 (×4): qty 1

## 2015-02-25 NOTE — H&P (Signed)
[facility] NAME: Troy [name]    MR#:  [id number]  DATE OF BIRTH:  Oct 19, 1923  DATE OF ADMISSION:  02/25/2015  PRIMARY CARE PHYSICIAN: Tracie Harrier, MD   REQUESTING/REFERRING PHYSICIAN: Dr Thomasene Lot  CHIEF COMPLAINT:   Chief Complaint  Patient presents with  . Altered Mental Status  . Urinary Tract Infection    HISTORY OF PRESENT ILLNESS:  Troy [name]  is a 81 y.o. male with a known history of Parkinson's disease, diabetes type 2, hypertension, chronic atrial fibrillation on Coumadin, congestive heart failure presents with acute onset bilateral lower extremity weakness which started 5 days ago. He was initially seen in the emergency room diagnosed with urinary tract infection and started on Keflex but symptoms have not improved. He has continued to be very weak in the lower extremities and has stopped walking. His wife reports that he is tired and wants to sleep all the time. She feels that his eyes and face and stomach have been swollen, denies lower extremity edema. He has not had orthopnea. She reports he has been having cold sweats at night and has also felt very hot at night. He has not been confused. Today he began complaining of back pain in the right lumbar area and also of bilateral leg pain with movement mainly in the hip area. His appetite is been decreased during this time but he has not had any nausea vomiting or diarrhea. On emergency room evaluation his legs are weak and he is not able to walk, usually walk walks with a walker or cane. He is hyperglycemic with blood sugars in the 400s which is unusual for him. His lactic acid is slightly elevated at 2.7 though he does not have a fever or leukocytosis.  PAST MEDICAL HISTORY:   Past Medical History  Diagnosis Date  . Diabetes mellitus without complication ([place])   . Hypertension   . UTI (lower urinary tract infection)   . Parkinson's disease ([place])   .  TIA (transient ischemic attack)   . Atrial fibrillation ([place])   . CHF (congestive heart failure) ([place])   . Stroke ([place])   . Hypothyroidism     PAST SURGICAL HISTORY:   Past Surgical History  Procedure Laterality Date  . Cardiac surgery    . Cholecystectomy      SOCIAL HISTORY:   Social History  Substance Use Topics  . Smoking status: Former Research scientist (life sciences)  . Smokeless tobacco: Never Used  . Alcohol Use: No    FAMILY HISTORY:   Family History  Problem Relation Age of Onset  . Hypertension Mother   . Stroke Mother   . CAD Father   . Lymphoma Sister   . Lung disease Brother     DRUG ALLERGIES:  No Known Allergies  REVIEW OF SYSTEMS:   Review of Systems  Constitutional: Positive for chills, malaise/fatigue and diaphoresis. Negative for fever and weight loss.  HENT: Positive for hearing loss. Negative for congestion.   Eyes: Negative for blurred vision and pain.  Respiratory: Negative for cough, hemoptysis, sputum production, shortness of breath and stridor.   Cardiovascular: Negative for chest pain, palpitations, orthopnea and leg swelling.  Gastrointestinal: Negative for nausea, vomiting, abdominal pain, diarrhea, constipation and blood in stool.  Genitourinary: Positive for flank pain. Negative for dysuria and frequency.  Musculoskeletal: Positive for myalgias and back pain. Negative for joint pain and neck pain.  Skin: Negative for rash.  Neurological: Positive for weakness. Negative for focal  weakness, loss of consciousness and headaches.  Endo/Heme/Allergies: Does not bruise/bleed easily.  Psychiatric/Behavioral: Negative for depression and hallucinations. The patient is not nervous/anxious.     MEDICATIONS AT HOME:   Prior to Admission medications   Medication Sig Start Date End Date Taking? Authorizing Provider  carbidopa-levodopa (PARCOPA) 25-250 MG disintegrating tablet Take 1 tablet by mouth 3 (three) times daily.   Yes Historical Provider, MD  cephALEXin  (KEFLEX) 500 MG capsule Take 1 capsule (500 mg total) by mouth 3 (three) times daily. 02/21/15  Yes Ahmed Prima, MD  furosemide (LASIX) 20 MG tablet Take 20 mg by mouth daily.   Yes Historical Provider, MD  glipiZIDE (GLUCOTROL XL) 10 MG 24 hr tablet Take 10 mg by mouth daily with breakfast.   Yes Historical Provider, MD  linagliptin (TRADJENTA) 5 MG TABS tablet Take 5 mg by mouth daily. 02/23/15  Yes Historical Provider, MD  lisinopril (PRINIVIL,ZESTRIL) 40 MG tablet Take 40 mg by mouth daily.   Yes Historical Provider, MD  metFORMIN (GLUCOPHAGE-XR) 500 MG 24 hr tablet Take 500 mg by mouth daily with breakfast.   Yes Historical Provider, MD  metoprolol succinate (TOPROL-XL) 50 MG 24 hr tablet Take 50 mg by mouth daily. Take with or immediately following a meal.   Yes Historical Provider, MD  warfarin (COUMADIN) 5 MG tablet Take 5 mg by mouth 2 (two) times a week. Take on Saturday and Sunday.  *note dose*   Yes Historical Provider, MD  warfarin (COUMADIN) 6 MG tablet Take 6 mg by mouth daily. Take on Monday thru Friday only. *note dose*   Yes Historical Provider, MD      VITAL SIGNS:  Blood pressure 156/86, pulse 78, temperature 98.2 F (36.8 C), temperature source Oral, resp. rate 21, height 5' 9"  (1.753 m), weight 107.956 kg (238 lb), SpO2 99 %.  PHYSICAL EXAMINATION:  GENERAL:  81 y.o.-year-old patient lying in the bed with no acute distress. Flushed EYES: Pupils equal, round, reactive to light and accommodation. No scleral icterus. Extraocular muscles intact.  HEENT: Head atraumatic, normocephalic. Oropharynx and nasopharynx clear. Good dentition, mucous membranes are pink and moist NECK:  Supple, no jugular venous distention. No thyroid enlargement, no tenderness. No lymphadenopathy LUNGS: Normal breath sounds bilaterally, no wheezing, rales,rhonchi or crepitation. No use of accessory muscles of respiration. He does take deep heavy breaths that does not seem to be in respiratory  distress CARDIOVASCULAR: S1, S2 normal. No murmurs, rubs, or gallops.  ABDOMEN: Soft, nontender, slightly. Bowel sounds present. No organomegaly or mass. No guarding no rebound EXTREMITIES: No pedal edema, cyanosis, or clubbing. Peripheral pulses 1+ NEUROLOGIC: Cranial nerves II through XII are grossly intact. Muscle strength 5/5 in all extremities with the exception of the hip flexors which are 4 out of 5. Sensation intact. All movements are delayed and he does have cogwheel rigidity of his upper extremities PSYCHIATRIC: The patient is alert and oriented x 3. Calm SKIN: No obvious rash, lesion, or ulcer.   LABORATORY PANEL:   CBC  Recent Labs Lab 02/25/15 1446  WBC 8.8  HGB 12.9*  HCT 39.4*  PLT 192   ------------------------------------------------------------------------------------------------------------------  Chemistries   Recent Labs Lab 02/25/15 1446  NA 137  K 3.4*  CL 98*  CO2 28  GLUCOSE 427*  BUN 17  CREATININE 1.13  CALCIUM 8.5*  AST 23  ALT 6*  ALKPHOS 69  BILITOT 1.5*   ------------------------------------------------------------------------------------------------------------------  Cardiac Enzymes No results for input(s): TROPONINI in the last 168 hours. ------------------------------------------------------------------------------------------------------------------  RADIOLOGY:  Dg Chest 1 View  02/25/2015  CLINICAL DATA:  81 year old male with altered mental status. EXAM: CHEST 1 VIEW COMPARISON:  02/21/2015 and prior exams FINDINGS: Moderate to marked cardiomegaly and mitral valve replacement again noted. Pulmonary vascular congestion is present. There is no evidence of focal airspace disease, pulmonary edema, suspicious pulmonary nodule/mass, pleural effusion, or pneumothorax. No acute bony abnormalities are identified. Remote left-sided rib fractures are present. IMPRESSION: Cardiomegaly with pulmonary vascular congestion. Electronically Signed    By: Margarette Canada M.D.   On: 02/25/2015 16:46   Ct Head Wo Contrast  02/25/2015  CLINICAL DATA:  Altered mental status. EXAM: CT HEAD WITHOUT CONTRAST TECHNIQUE: Contiguous axial images were obtained from the base of the skull through the vertex without intravenous contrast. COMPARISON:  CT scan of February 22, 2015. FINDINGS: Fluid is again noted in the right mastoid air cells. Bony calvarium is otherwise intact. Moderate diffuse cortical atrophy is noted. Mild chronic ischemic white matter disease is noted. Old lacunar infarction is noted in right basal ganglia. No mass effect or midline shift is noted. Ventricular size is within normal limits. There is no evidence of mass lesion, hemorrhage or acute infarction. IMPRESSION: Moderate diffuse cortical atrophy. Mild chronic ischemic white matter disease. No acute intracranial abnormality seen. Electronically Signed   By: Marijo Conception, M.D.   On: 02/25/2015 17:44   Ct Chest W Contrast  02/25/2015  CLINICAL DATA:  Generalized weakness and decreased appetite with cough EXAM: CT CHEST, ABDOMEN, AND PELVIS WITH CONTRAST TECHNIQUE: Multidetector CT imaging of the chest, abdomen and pelvis was performed following the standard protocol during bolus administration of intravenous contrast. CONTRAST:  151m OMNIPAQUE IOHEXOL 300 MG/ML  SOLN COMPARISON:  06/05/2014 FINDINGS: CT CHEST FINDINGS Mediastinum/Nodes: The thoracic aorta is within normal limits without evidence of aneurysmal dilatation or dissection. No mediastinal hematoma is seen. The origins of the brachiocephalic vessels are within normal limits. Pulmonary artery shows no definitive pulmonary emboli although timing was not performed for embolus evaluation. No significant hilar or mediastinal adenopathy is noted. Mild coronary calcifications are seen. Lungs/Pleura: The lungs are well aerated bilaterally. No focal infiltrate or sizable effusion is noted. No sizable pneumothorax is noted. Musculoskeletal:  Healing rib fractures are noted on the left degenerative changes of thoracic spine are noted. CT ABDOMEN PELVIS FINDINGS Hepatobiliary: Gallbladder has been surgically removed. The liver is within normal limits. Pancreas: Mildly atrophic.  No focal mass lesion is seen. Spleen: Within normal limits. Adrenals/Urinary Tract: The adrenal glands are unremarkable. The kidneys demonstrate hypodensities bilaterally consistent with cysts. No renal calculi or obstructive changes are noted. The largest of the cysts on the left measures 7.9 cm. The largest on the right measures approximately 6 cm. Bladder is well distended. Stomach/Bowel: Scattered diverticular changes noted without evidence of diverticulitis. The appendix is within normal limits. Vascular/Lymphatic: Aortoiliac calcifications are seen without aneurysmal dilatation. No significant lymphadenopathy is identified. Reproductive: Prostatic calcifications are noted. Other: A stimulation device is noted in the left hemipelvis. Musculoskeletal: Degenerative changes of the lumbar spine are seen. IMPRESSION: CT of the chest:  No acute abnormality identified. CT of the abdomen and pelvis:  Multiple renal cysts. Diverticular change without diverticulitis No acute abnormality is noted. Electronically Signed   By: MInez CatalinaM.D.   On: 02/25/2015 17:51   Ct Abdomen Pelvis W Contrast  02/25/2015  CLINICAL DATA:  Generalized weakness and decreased appetite with cough EXAM: CT CHEST, ABDOMEN, AND PELVIS WITH CONTRAST TECHNIQUE: Multidetector CT imaging of  the chest, abdomen and pelvis was performed following the standard protocol during bolus administration of intravenous contrast. CONTRAST:  171m OMNIPAQUE IOHEXOL 300 MG/ML  SOLN COMPARISON:  06/05/2014 FINDINGS: CT CHEST FINDINGS Mediastinum/Nodes: The thoracic aorta is within normal limits without evidence of aneurysmal dilatation or dissection. No mediastinal hematoma is seen. The origins of the brachiocephalic  vessels are within normal limits. Pulmonary artery shows no definitive pulmonary emboli although timing was not performed for embolus evaluation. No significant hilar or mediastinal adenopathy is noted. Mild coronary calcifications are seen. Lungs/Pleura: The lungs are well aerated bilaterally. No focal infiltrate or sizable effusion is noted. No sizable pneumothorax is noted. Musculoskeletal: Healing rib fractures are noted on the left degenerative changes of thoracic spine are noted. CT ABDOMEN PELVIS FINDINGS Hepatobiliary: Gallbladder has been surgically removed. The liver is within normal limits. Pancreas: Mildly atrophic.  No focal mass lesion is seen. Spleen: Within normal limits. Adrenals/Urinary Tract: The adrenal glands are unremarkable. The kidneys demonstrate hypodensities bilaterally consistent with cysts. No renal calculi or obstructive changes are noted. The largest of the cysts on the left measures 7.9 cm. The largest on the right measures approximately 6 cm. Bladder is well distended. Stomach/Bowel: Scattered diverticular changes noted without evidence of diverticulitis. The appendix is within normal limits. Vascular/Lymphatic: Aortoiliac calcifications are seen without aneurysmal dilatation. No significant lymphadenopathy is identified. Reproductive: Prostatic calcifications are noted. Other: A stimulation device is noted in the left hemipelvis. Musculoskeletal: Degenerative changes of the lumbar spine are seen. IMPRESSION: CT of the chest:  No acute abnormality identified. CT of the abdomen and pelvis:  Multiple renal cysts. Diverticular change without diverticulitis No acute abnormality is noted. Electronically Signed   By: MInez CatalinaM.D.   On: 02/25/2015 17:51    EKG:   Orders placed or performed during the hospital encounter of 02/25/15  . ED EKG  . ED EKG    IMPRESSION AND PLAN:   #1 bilateral Lower extremity weakness: Acute in onset 5 days ago. CT of the abdomen pelvis is  normal. I will obtain an MRI of the lumbar spine as he complains of pain in the right lumbar area. [facility]'ll obtain physical therapy consultation. We'll check hemoglobin A1c, B12, ESR and RPR  #2 hyperglycemia with history of diabetes mellitus type 2: Check a hemoglobin A1c and start sliding scale. Hold oral hypoglycemic agents at this time. Unclear what his caused his spike and a blood glucose as he is usually well controlled.  #3 atrial fibrillation: Rate is controlled. Continue Coumadin. Continue metoprolol.  #4 Parkinson's disease: Family reports that symptoms have been stable until 5 days ago at which time he became too weak to walk and to fatigued to perform ADLs. Continue Sinemet. Consult neurology.  #5 hypertension: Continue metoprolol and lisinopril.  #6 potential UTI: Urine has consistently shown 6-30 white blood cells. He has been on Keflex now for 3 days. Will await urine cultures. Hold off on antibiotics for now as he is afebrile there is no leukocytosis. He does not have any dysuria or frequency.  #7 hypoxia: Initial oxygen saturation 89% on room air. Chest x-ray with pulmonary vascular congestion. Likely due to diastolic dysfunction. Will continue with Lasix.  #8 congestive heart failure, diastolic, with acute exacerbation: Continue with Lasix, low sodium diet daily weights monitor I's and O's.    All the records are reviewed and case discussed with ED provider. Management plans discussed with the patient, family and they are in agreement. Care plan  discussed with the patient's wife son and home caregiver.  CODE STATUS: Full. The wife states that he has a living will and will discuss this with his primary care provider in the morning   TOTAL TIME TAKING CARE OF THIS PATIENT: 50 minutes.  Greater than 50% of time spent in coordination of care and counseling.  Myrtis Ser M.D on 02/25/2015 at 7:52 PM  Between 7am to 6pm - Pager -  [phone]  After 6pm go to [email or web address] - password [email or web address] [facility]  Office  [phone]  CC: Primary care physician; Tracie Harrier, MD

## 2015-02-25 NOTE — ED Notes (Signed)
Pt states that he wears home oxygen while in the bed, pt placed on 2L . Pt unable to state what is wrong or why he is here. States that he just does not feel well in general. Pt unsure why he was here 4 days ago, states that he did not go home with any medications. No cough present at this time. Color WNL.

## 2015-02-25 NOTE — ED Provider Notes (Signed)
[facility] Emergency Department Provider Note  ____________________________________________  Time seen: 1535  I have reviewed the triage vital signs and the nursing notes.  Level V caveat - limited history and review of systems due to the patient's diminished communication and altered mental status.  HISTORY  Chief Complaint Altered Mental Status and Urinary Tract Infection     HPI Troy [name] is a 81 y.o. male who I saw 4 days ago. At that time, his family was concerned about some weakness and altered mental status and the possibility of urinary tract infection. The patient had a urine that overall looked okay, but had 6-30 white blood cells per field. The rest of his evaluation at time was fairly normal and he was stable for discharge home. He was placed on Keflex in case of urinary tract infection.  Today, the family returns with the patient reporting that he seems to be doing worse. He is weaker, less communicative, and has greater altered mental status. They deny any noted fever. They deny any nausea vomiting or diarrhea. He is not eating much.   The wife is concerned about the patient's abdomen. She reports it appears distended at times. She also is concerned he is losing weight and reports that his pants have been fitting looser.  The family is concerned that there is a little bit of puffiness, edema, in the upper eyelids.       Past Medical History  Diagnosis Date  . Diabetes mellitus without complication (HCC)   . Hypertension   . UTI (lower urinary tract infection)   . Parkinson's disease (HCC)   . TIA (transient ischemic attack)   . Atrial fibrillation (HCC)   . CHF (congestive heart failure) (HCC)     There are no active problems to display for this patient.   Past Surgical History  Procedure Laterality Date  . Cardiac surgery    . Cholecystectomy      Current Outpatient Rx  Name  Route  Sig  Dispense  Refill  .  atorvastatin (LIPITOR) 40 MG tablet   Oral   Take 40 mg by mouth daily.         . carbidopa-levodopa (PARCOPA) 25-250 MG disintegrating tablet   Oral   Take 1 tablet by mouth 3 (three) times daily.         . cephALEXin (KEFLEX) 500 MG capsule   Oral   Take 1 capsule (500 mg total) by mouth 3 (three) times daily.   21 capsule   0   . glipiZIDE (GLUCOTROL XL) 10 MG 24 hr tablet   Oral   Take 10 mg by mouth daily with breakfast.         . linagliptin (TRADJENTA) 5 MG TABS tablet   Oral   Take 5 mg by mouth daily.         [name] lisinopril (PRINIVIL,ZESTRIL) 40 MG tablet   Oral   Take 40 mg by mouth daily.         . metFORMIN (GLUCOPHAGE) 1000 MG tablet   Oral   Take 1,000 mg by mouth 2 (two) times daily with a meal.         . metoprolol succinate (TOPROL-XL) 50 MG 24 hr tablet   Oral   Take 50 mg by mouth daily. Take with or immediately following a meal.         . warfarin (COUMADIN) 6 MG tablet   Oral   Take 6 mg by mouth daily.  Allergies Review of patient's allergies indicates no known allergies.  No family history on file.  Social History Social History  Substance Use Topics  . Smoking status: Former Games developer  . Smokeless tobacco: Never Used  . Alcohol Use: No    Review of Systems Limited review of systems due to the patient's mental status. Constitutional: Negative for fever. Neurological: Gen. weakness, altered mental status. See history of present illness    ____________________________________________   PHYSICAL EXAM:  VITAL SIGNS: ED Triage Vitals  Enc Vitals Group     BP 02/25/15 1440 126/66 mmHg     Pulse Rate 02/25/15 1440 87     Resp 02/25/15 1440 24     Temp 02/25/15 1440 98.2 F (36.8 C)     Temp Source 02/25/15 1440 Axillary     SpO2 02/25/15 1440 89 %     Weight 02/25/15 1443 238 lb (107.956 kg)     Height 02/25/15 1443  (1.753 m)     Head Cir --      Peak Flow --      Pain Score --      Pain Loc --       Pain Edu? --      Excl. in GC? --     Constitutional:  Somewhat somnolent, opens eyes and responds a little bit to voice, but not fully communicative area offers little detailed information. ENT   Head: Normocephalic and atraumatic.   Nose: No congestion/rhinnorhea.    Cardiovascular: Normal rate, regular rhythm, no murmur noted Respiratory:  Normal respiratory effort, no tachypnea.    Breath sounds are clear and equal bilaterally.  Gastrointestinal: Soft and nontender. No distention.  Back: No muscle spasm, no tenderness, no CVA tenderness. Musculoskeletal: No deformity noted. Nontender with normal range of motion in all extremities.  No noted edema. Neurologic:  Gen. weakness with decreased communication. Not fully cognitive. Does follow basic commands and can move both feet and hands. Able to sit up with assistance. Skin:  Skin is warm, dry. No rash noted. Change in complexion with slight yellow-tan appearance. Psychiatric: Decreased communication, appears a little bit ill..  ____________________________________________    LABS (pertinent positives/negatives)  Labs Reviewed  COMPREHENSIVE METABOLIC PANEL - Abnormal; Notable for the following:    Potassium 3.4 (*)    Chloride 98 (*)    Glucose, Bld 427 (*)    Calcium 8.5 (*)    Albumin 3.0 (*)    ALT 6 (*)    Total Bilirubin 1.5 (*)    GFR calc non Af Amer 56 (*)    All other components within normal limits  CBC - Abnormal; Notable for the following:    RBC 4.33 (*)    Hemoglobin 12.9 (*)    HCT 39.4 (*)    All other components within normal limits  URINALYSIS COMPLETEWITH MICROSCOPIC ([facility] ONLY) - Abnormal; Notable for the following:    Color, Urine YELLOW (*)    APPearance CLEAR (*)    Glucose, UA >500 (*)    Ketones, ur TRACE (*)    Hgb urine dipstick 3+ (*)    Protein, ur 100 (*)    All other components within normal limits  LACTIC ACID, PLASMA - Abnormal; Notable for the following:    Lactic Acid,  Venous 2.7 (*)    All other components within normal limits  BLOOD GAS, ARTERIAL - Abnormal; Notable for the following:    pO2, Arterial 69 (*)    Bicarbonate 29.8 (*)  Acid-Base Excess 4.5 (*)    Allens test (pass/fail) POSITIVE (*)    All other components within normal limits  CULTURE, BLOOD (ROUTINE X 2)  CULTURE, BLOOD (ROUTINE X 2)  URINE CULTURE  LACTIC ACID, PLASMA  CBG MONITORING, ED     ____________________________________________   EKG  ED ECG REPORT I, Erza Mothershead W, the attending physician, personally viewed and interpreted this ECG.   Date: 02/25/2015  EKG Time: 1439  Rate: 92  Rhythm:Atrial fibrillation    Axis: Normal  Intervals: Normal  ST&T Change: QTC at 532   ____________________________________________    RADIOLOGY  Chest x-ray: IMPRESSION: Cardiomegaly with pulmonary vascular congestion.   CT chest abdomen and pelvis: IMPRESSION: CT of the chest: No acute abnormality identified.  CT of the abdomen and pelvis: Multiple renal cysts.  Diverticular change without diverticulitis  No acute abnormality is noted.  ____________________________________________   PROCEDURES  CRITICAL CARE Performed by: Darien Ramus   Total critical care time: 45 minutes due to the complex diagnostic nature of this patient with altered mental status and an abnormal elevated lactic acid level.  Critical care time was exclusive of separately billable procedures and treating other patients.  Critical care was necessary to treat or prevent imminent or life-threatening deterioration.  Critical care was time spent personally by me on the following activities: development of treatment plan with patient and/or surrogate as well as nursing, discussions with consultants, evaluation of patient's response to treatment, examination of patient, obtaining history from patient or surrogate, ordering and performing treatments and interventions, ordering and  review of laboratory studies, ordering and review of radiographic studies, pulse oximetry and re-evaluation of patient's condition.   ____________________________________________   INITIAL IMPRESSION / ASSESSMENT AND PLAN / ED COURSE  Pertinent labs & imaging results that were available during my care of the patient were reviewed by me and considered in my medical decision making (see chart for details).  Unclear cause of altered mental status and worsening general weakness. The patient's vital signs and blood tests overall look good. He has minor indication of a urinary tract infection. He has been on Keflex for this with no change in the urine results.   There are concerns for some abdominal distention at times. He also has a change in his complexion. He has an implanted device. The patient's wife is reported that he has been having some back pain.   Of note, his glucose is notably elevated at over 400. The family has diligently been tending to his diabetic regimen and he has been receiving his regular medicines. This is an acute change.  The underlying cause to this patient's condition is unclear to me at this time. We will extend our evaluation by doing a scan through the belly to look at his kidneys and intestinal track and the area around a stimulator as well and be sure that we don't see any sign of infection that would also have triggered an increase in his glucose level.  We will treat his elevated glucose level with IV fluids and a small dose of insulin. We will plan on admitting the patient for altered mental status, possible encephalopathy, if we do not have a more definitive diagnosis from the further testing.   ----------------------------------------- 5:46 PM on 02/25/2015 -----------------------------------------  Patient's lactic acid has come back a little bit elevated at 2.7. He has a normal white blood cell count. It is unclear what is causing his altered mental status  and this increase and lactic acid. Blood  cultures are pending. We are performing a CT of the chest abdomen or pelvis due to the unclear clinical picture.  ----------------------------------------- 6:24 PM on 02/25/2015 -----------------------------------------  Patient's CT of chest abdomen and pelvis showed no acute abnormalities.  Will admit him to the hospital for ongoing evaluation of his altered mental status, weakness, worrisome for encephalitis.  This was discussed with Dr. Elby Showersatherine Walsh. ____________________________________________   FINAL CLINICAL IMPRESSION(S) / ED DIAGNOSES  Final diagnoses:  Delirium  Elevated lactic acid level  General weakness  Hyperglycemia due to type 1 diabetes mellitus (HCC)      Darien Ramusavid W Hakeem Frazzini, MD 02/25/15 [phone]

## 2015-02-25 NOTE — ED Notes (Signed)
MD at bedside. 

## 2015-02-25 NOTE — ED Notes (Signed)
Patient transported to CT 

## 2015-02-25 NOTE — ED Notes (Addendum)
Pt arrives from home via [facility] with c/o AMS and UTI. Pt states that he began to feel bad approx 12 hours ago. Pt alert and oriented X4 at this time. Pt warm and dry, afebrile. Pt was seen in ER X4 days ago.

## 2015-02-25 NOTE — ED Notes (Signed)
Lactic 2.7 reported to Dr. Carollee MassedKaminski.

## 2015-02-25 NOTE — ED Notes (Signed)
Pt family at bedside. Family states that pt was unable to walk beginning last Tuesday. States that pt seen in ER on Wednesday and blood work, urine sample and chest xray. Family states that pt had mild UTI at that time. Pt went to his PCP on Friday, CT was performed and ruled out stroke. Family states that pt has generally been feeling bad. Pt family reports that pt becomes very clammy and sweaty each evening, only occurs at night. Decreased appetite. Coughing up congestion. CBG has been in 200's per family for the last few days. Pt totally independent at baseline per family.

## 2015-02-26 DIAGNOSIS — R29898 Other symptoms and signs involving the musculoskeletal system: Secondary | ICD-10-CM | POA: Diagnosis not present

## 2015-02-26 LAB — GLUCOSE, CAPILLARY
GLUCOSE-CAPILLARY: 163 mg/dL — AB (ref 65–99)
GLUCOSE-CAPILLARY: 214 mg/dL — AB (ref 65–99)
GLUCOSE-CAPILLARY: 238 mg/dL — AB (ref 65–99)
Glucose-Capillary: 258 mg/dL — ABNORMAL HIGH (ref 65–99)
Glucose-Capillary: 221 mg/dL — ABNORMAL HIGH (ref 65–99)

## 2015-02-26 LAB — BASIC METABOLIC PANEL
ANION GAP: 6 (ref 5–15)
BUN: 12 mg/dL (ref 6–20)
CALCIUM: 8 mg/dL — AB (ref 8.9–10.3)
CHLORIDE: 103 mmol/L (ref 101–111)
CO2: 30 mmol/L (ref 22–32)
Creatinine, Ser: 0.85 mg/dL (ref 0.61–1.24)
GFR calc non Af Amer: >60 mL/min (ref >60)
Glucose, Bld: 196 mg/dL — ABNORMAL HIGH (ref 65–99)
POTASSIUM: 3.2 mmol/L — AB (ref 3.5–5.1)
Sodium: 139 mmol/L (ref 135–145)

## 2015-02-26 LAB — CBC
HEMATOCRIT: 37.4 % — AB (ref 40.0–52.0)
HEMOGLOBIN: 12.6 g/dL — AB (ref 13.0–18.0)
MCH: 30.9 pg (ref 26.0–34.0)
MCHC: 33.7 g/dL (ref 32.0–36.0)
MCV: 91.8 fL (ref 80.0–100.0)
Platelets: 179 10*3/uL (ref 150–440)
RBC: 4.07 MIL/uL — AB (ref 4.40–5.90)
RDW: 13.5 % (ref 11.5–14.5)
WBC: 7.1 10*3/uL (ref 3.8–10.6)

## 2015-02-26 LAB — PROTIME-INR
INR: 3.96
PROTHROMBIN TIME: 38.6 s — AB (ref 11.4–15.0)

## 2015-02-26 LAB — SEDIMENTATION RATE: Sed Rate: 56 mm/hr — ABNORMAL HIGH (ref 0–20)

## 2015-02-26 LAB — VITAMIN B12: VITAMIN B 12: 152 pg/mL — AB (ref 180–914)

## 2015-02-26 LAB — HEMOGLOBIN A1C: HEMOGLOBIN A1C: 9.8 % — AB (ref 4.0–6.0)

## 2015-02-26 MED ORDER — WARFARIN - PHARMACIST DOSING INPATIENT
Freq: Every day | Status: DC
  Administered 2015-02-26: 18:00:00

## 2015-02-26 MED ORDER — WARFARIN SODIUM 5 MG PO TABS: 5.0000 mg | ORAL_TABLET | ORAL | Status: DC

## 2015-02-26 MED ORDER — DEXTROSE 5 % IV SOLN
10.0000 mg | Freq: Once | INTRAVENOUS | Status: AC
  Administered 2015-02-26: 10 mg via INTRAVENOUS
  Filled 2015-02-26: qty 1

## 2015-02-26 MED ORDER — WARFARIN SODIUM 5 MG PO TABS: 6.0000 mg | ORAL_TABLET | ORAL | Status: DC

## 2015-02-26 NOTE — Progress Notes (Signed)
Inpatient Diabetes Program Recommendations  AACE/ADA: New Consensus Statement on Inpatient Glycemic Control (2015)  Target Ranges:  Prepandial:   less than 140 mg/dL      Peak postprandial:   less than 180 mg/dL (1-2 hours)      Critically ill patients:  140 - 180 mg/dL  Results for Troy [name], Troy [name] (MRN [id number]) as of 02/26/2015 08:39  Ref. Range 02/25/2015 18:30 02/25/2015 19:44 02/25/2015 21:19 02/26/2015 01:47  Glucose-Capillary Latest Ref Range: 65-99 mg/dL 409234 (H) 811238 (H) 914238 (H) 163 (H)  Results for Troy [name], Troy [name] (MRN [id number]) as of 02/26/2015 08:39  Ref. Range 02/25/2015 14:46  Hemoglobin A1C Latest Ref Range: 4.0-6.0 % 9.8 (H)  Glucose Latest Ref Range: 65-99 mg/dL 086427 (H)   Review of Glycemic Control  Diabetes history: DM2 Outpatient Diabetes medications: Humulin R U500 4 units with breakfast and supper, Lantus 40 units QHS, Glipizide 10 mg QAM, Tradjenat 5 mg daily, Metformin XR 500 mg QAM Current orders for Inpatient glycemic control: Novolog 0-9 units TID with meals, Novolog 0-5 units HS  Inpatient Diabetes Program Recommendations: Insulin - Basal: Please consider ordering low dose basal insulin. Recommend starting with at least Lantus 10 units Q24H starting now. HgbA1C: A1C 9.8% on 02/25/15 indicating poor glycemic control over the past 2-3 months. Inpatient Referral: May want to consider consulting Dr. Tedd SiasSolum to follow while inpatient to assist with inpatient glycemic control.  NOTE: in reviewing the chart, noted Humulin R U500 and Lantus are not listed on home medication list. However, noted in care everywhere tab that patient is followed by Dr. Tedd SiasSolum and was last seen by Dr. Tedd SiasSolum on 01/05/2015 and patient is prescribed Humulin R U500 and Lantus as noted above in outpatient DM medications. Will notify pharmacy to correct home medication list in chart.    Thanks, [name], RN, MSN, CCRN, CDE Diabetes Coordinator Inpatient Diabetes Program [phone] (Team  Pager from 8am to 5pm) [phone] ([facility] office) [phone] [facility] office) [phone] [facility] office)

## 2015-02-26 NOTE — Consult Note (Signed)
CC: b/l LE weakness   HPI: Troy [name] is an 81 y.o. male with a known history of Parkinson's disease, diabetes type 2, hypertension, chronic atrial fibrillation on Coumadin, congestive heart failure presents with acute onset bilateral lower extremity weakness which started 5 days ago. As per family pt woke up with sudden onset of weakness.  Usually ambulates with walker or cane.   He was initially seen in the emergency room diagnosed with urinary tract infection and started on Keflex but symptoms have not improved. Found to be  hyperglycemic with blood sugars in the 400s.  Past Medical History  Diagnosis Date  . Diabetes mellitus without complication (HCC)   . Hypertension   . UTI (lower urinary tract infection)   . Parkinson's disease (HCC)   . TIA (transient ischemic attack)   . Atrial fibrillation (HCC)   . CHF (congestive heart failure) (HCC)   . Stroke (HCC)   . Hypothyroidism     Past Surgical History  Procedure Laterality Date  . Cardiac surgery    . Cholecystectomy      Family History  Problem Relation Age of Onset  . Hypertension Mother   . Stroke Mother   . CAD Father   . Lymphoma Sister   . Lung disease Brother     Social History:  reports that he has quit smoking. He has never used smokeless tobacco. He reports that he does not drink alcohol or use illicit drugs.  No Known Allergies  Medications: I have reviewed the patient's current medications.  ROS: History obtained from the patient  General ROS: negative for - chills, fatigue, fever, night sweats, weight gain or weight loss Psychological ROS: negative for - behavioral disorder, hallucinations, memory difficulties, mood swings or suicidal ideation Ophthalmic ROS: negative for - blurry vision, double vision, eye pain or loss of vision ENT ROS: negative for - epistaxis, nasal discharge, oral lesions, sore throat, tinnitus or vertigo Allergy and Immunology ROS: negative for - hives or itchy/watery  eyes Hematological and Lymphatic ROS: negative for - bleeding problems, bruising or swollen lymph nodes Endocrine ROS: negative for - galactorrhea, hair pattern changes, polydipsia/polyuria or temperature intolerance Respiratory ROS: negative for - cough, hemoptysis, shortness of breath or wheezing Cardiovascular ROS: negative for - chest pain, dyspnea on exertion, edema or irregular heartbeat Gastrointestinal ROS: negative for - abdominal pain, diarrhea, hematemesis, nausea/vomiting or stool incontinence Genito-Urinary ROS: negative for - incontinent with spinal stimulator Musculoskeletal ROS: negative for - joint swelling or muscular weakness Neurological ROS: as noted in HPI Dermatological ROS: negative for rash and skin lesion changes  Physical Examination: Blood pressure 141/78, pulse 75, temperature 98.1 F (36.7 C), temperature source Oral, resp. rate 17, height 5' 9.5" (1.765 m), weight 224 lb 11.2 oz (101.923 kg), SpO2 97 %.   Neurological Examination Mental Status: Alert, oriented, thought content appropriate.  Speech fluent without evidence of aphasia.  Able to follow 3 step commands without difficulty. Cranial Nerves: II: Discs flat bilaterally; Visual fields grossly normal, pupils equal, round, reactive to light and accommodation III,IV, VI: ptosis not present, extra-ocular motions intact bilaterally V,VII: smile symmetric, facial light touch sensation normal bilaterally VIII: hearing normal bilaterally IX,X: gag reflex present XI: bilateral shoulder shrug XII: midline tongue extension Motor: Right : Upper extremity   5/5    Left:     Upper extremity   5/5  LE: b/l pt is weak proximally with strength 3/5 but he is stronger distally at 4/5.  Tone and bulk:normal tone  throughout; no atrophy noted Sensory: Pinprick and light touch intact but does have glove/stocking sensory deficits.  Deep Tendon Reflexes: absent in LE but are present in his upper extremities.    Plantars: Right: downgoing   Left: downgoing Cerebellar: normal finger-to-nose, normal rapid alternating movements and normal heel-to-shin test Gait: normal gait and station      Laboratory Studies:   Basic Metabolic Panel:  Recent Labs Lab 02/21/15 1017 02/25/15 1446 02/26/15 0453  NA 140 137 139  K 4.2 3.4* 3.2*  CL 102 98* 103  CO2 30 28 30   GLUCOSE 195* 427* 196*  BUN 15 17 12   CREATININE 0.85 1.13 0.85  CALCIUM 8.9 8.5* 8.0*    Liver Function Tests:  Recent Labs Lab 02/25/15 1446  AST 23  ALT 6*  ALKPHOS 69  BILITOT 1.5*  PROT 6.9  ALBUMIN 3.0*   No results for input(s): LIPASE, AMYLASE in the last 168 hours. No results for input(s): AMMONIA in the last 168 hours.  CBC:  Recent Labs Lab 02/21/15 1017 02/25/15 1446 02/26/15 0453  WBC 7.6 8.8 7.1  HGB 14.2 12.9* 12.6*  HCT 42.9 39.4* 37.4*  MCV 91.5 91.1 91.8  PLT 149* 192 179    Cardiac Enzymes: No results for input(s): CKTOTAL, CKMB, CKMBINDEX, TROPONINI in the last 168 hours.  BNP: Invalid input(s): POCBNP  CBG:  Recent Labs Lab 02/25/15 1944 02/25/15 2119 02/26/15 0147 02/26/15 0738 02/26/15 1128  GLUCAP 238* 238* 163* 214* 258*    Microbiology: Results for orders placed or performed during the hospital encounter of 02/25/15  Urine culture     Status: None (Preliminary result)   Collection Time: 02/25/15  2:46 PM  Result Value Ref Range Status   Specimen Description URINE, RANDOM  Final   Special Requests Normal  Final   Culture TOO YOUNG TO READ  Final   Report Status PENDING  Incomplete    Coagulation Studies:  Recent Labs  02/26/15 0453  LABPROT 38.6*  INR 3.96    Urinalysis:  Recent Labs Lab 02/21/15 1113 02/25/15 1446  COLORURINE YELLOW* YELLOW*  LABSPEC 1.021 1.023  PHURINE 6.0 5.0  GLUCOSEU 50* >500*  HGBUR 2+* 3+*  BILIRUBINUR NEGATIVE NEGATIVE  KETONESUR TRACE* TRACE*  PROTEINUR 100* 100*  NITRITE NEGATIVE NEGATIVE  LEUKOCYTESUR TRACE*  NEGATIVE    Lipid Panel:  No results found for: CHOL, TRIG, HDL, CHOLHDL, VLDL, LDLCALC  HgbA1C:  Lab Results  Component Value Date   HGBA1C 9.8* 02/25/2015    Urine Drug Screen:  No results found for: LABOPIA, COCAINSCRNUR, LABBENZ, AMPHETMU, THCU, LABBARB  Alcohol Level: No results for input(s): ETH in the last 168 hours.   Imaging: Dg Chest 1 View  02/25/2015  CLINICAL DATA:  81 year old male with altered mental status. EXAM: CHEST 1 VIEW COMPARISON:  02/21/2015 and prior exams FINDINGS: Moderate to marked cardiomegaly and mitral valve replacement again noted. Pulmonary vascular congestion is present. There is no evidence of focal airspace disease, pulmonary edema, suspicious pulmonary nodule/mass, pleural effusion, or pneumothorax. No acute bony abnormalities are identified. Remote left-sided rib fractures are present. IMPRESSION: Cardiomegaly with pulmonary vascular congestion. Electronically Signed   By: Harmon PierJeffrey  Hu M.D.   On: 02/25/2015 16:46   Ct Head Wo Contrast  02/25/2015  CLINICAL DATA:  Altered mental status. EXAM: CT HEAD WITHOUT CONTRAST TECHNIQUE: Contiguous axial images were obtained from the base of the skull through the vertex without intravenous contrast. COMPARISON:  CT scan of February 22, 2015. FINDINGS: Fluid is again  noted in the right mastoid air cells. Bony calvarium is otherwise intact. Moderate diffuse cortical atrophy is noted. Mild chronic ischemic white matter disease is noted. Old lacunar infarction is noted in right basal ganglia. No mass effect or midline shift is noted. Ventricular size is within normal limits. There is no evidence of mass lesion, hemorrhage or acute infarction. IMPRESSION: Moderate diffuse cortical atrophy. Mild chronic ischemic white matter disease. No acute intracranial abnormality seen. Electronically Signed   By: Lupita Raider, M.D.   On: 02/25/2015 17:44   Ct Chest W Contrast  02/25/2015  CLINICAL DATA:  Generalized weakness and  decreased appetite with cough EXAM: CT CHEST, ABDOMEN, AND PELVIS WITH CONTRAST TECHNIQUE: Multidetector CT imaging of the chest, abdomen and pelvis was performed following the standard protocol during bolus administration of intravenous contrast. CONTRAST:  OMNIPAQUE IOHEXOL 300 MG/ML  SOLN COMPARISON:  06/05/2014 FINDINGS: CT CHEST FINDINGS Mediastinum/Nodes: The thoracic aorta is within normal limits without evidence of aneurysmal dilatation or dissection. No mediastinal hematoma is seen. The origins of the brachiocephalic vessels are within normal limits. Pulmonary artery shows no definitive pulmonary emboli although timing was not performed for embolus evaluation. No significant hilar or mediastinal adenopathy is noted. Mild coronary calcifications are seen. Lungs/Pleura: The lungs are well aerated bilaterally. No focal infiltrate or sizable effusion is noted. No sizable pneumothorax is noted. Musculoskeletal: Healing rib fractures are noted on the left degenerative changes of thoracic spine are noted. CT ABDOMEN PELVIS FINDINGS Hepatobiliary: Gallbladder has been surgically removed. The liver is within normal limits. Pancreas: Mildly atrophic.  No focal mass lesion is seen. Spleen: Within normal limits. Adrenals/Urinary Tract: The adrenal glands are unremarkable. The kidneys demonstrate hypodensities bilaterally consistent with cysts. No renal calculi or obstructive changes are noted. The largest of the cysts on the left measures 7.9 cm. The largest on the right measures approximately 6 cm. Bladder is well distended. Stomach/Bowel: Scattered diverticular changes noted without evidence of diverticulitis. The appendix is within normal limits. Vascular/Lymphatic: Aortoiliac calcifications are seen without aneurysmal dilatation. No significant lymphadenopathy is identified. Reproductive: Prostatic calcifications are noted. Other: A stimulation device is noted in the left hemipelvis. Musculoskeletal:  Degenerative changes of the lumbar spine are seen. IMPRESSION: CT of the chest:  No acute abnormality identified. CT of the abdomen and pelvis:  Multiple renal cysts. Diverticular change without diverticulitis No acute abnormality is noted. Electronically Signed   By: Alcide Clever M.D.   On: 02/25/2015 17:51   Ct Abdomen Pelvis W Contrast  02/25/2015  CLINICAL DATA:  Generalized weakness and decreased appetite with cough EXAM: CT CHEST, ABDOMEN, AND PELVIS WITH CONTRAST TECHNIQUE: Multidetector CT imaging of the chest, abdomen and pelvis was performed following the standard protocol during bolus administration of intravenous contrast. CONTRAST:  OMNIPAQUE IOHEXOL 300 MG/ML  SOLN COMPARISON:  06/05/2014 FINDINGS: CT CHEST FINDINGS Mediastinum/Nodes: The thoracic aorta is within normal limits without evidence of aneurysmal dilatation or dissection. No mediastinal hematoma is seen. The origins of the brachiocephalic vessels are within normal limits. Pulmonary artery shows no definitive pulmonary emboli although timing was not performed for embolus evaluation. No significant hilar or mediastinal adenopathy is noted. Mild coronary calcifications are seen. Lungs/Pleura: The lungs are well aerated bilaterally. No focal infiltrate or sizable effusion is noted. No sizable pneumothorax is noted. Musculoskeletal: Healing rib fractures are noted on the left degenerative changes of thoracic spine are noted. CT ABDOMEN PELVIS FINDINGS Hepatobiliary: Gallbladder has been surgically removed. The liver is within normal limits.  Pancreas: Mildly atrophic.  No focal mass lesion is seen. Spleen: Within normal limits. Adrenals/Urinary Tract: The adrenal glands are unremarkable. The kidneys demonstrate hypodensities bilaterally consistent with cysts. No renal calculi or obstructive changes are noted. The largest of the cysts on the left measures 7.9 cm. The largest on the right measures approximately 6 cm. Bladder is well  distended. Stomach/Bowel: Scattered diverticular changes noted without evidence of diverticulitis. The appendix is within normal limits. Vascular/Lymphatic: Aortoiliac calcifications are seen without aneurysmal dilatation. No significant lymphadenopathy is identified. Reproductive: Prostatic calcifications are noted. Other: A stimulation device is noted in the left hemipelvis. Musculoskeletal: Degenerative changes of the lumbar spine are seen. IMPRESSION: CT of the chest:  No acute abnormality identified. CT of the abdomen and pelvis:  Multiple renal cysts. Diverticular change without diverticulitis No acute abnormality is noted. Electronically Signed   By: Alcide Clever M.D.   On: 02/25/2015 17:51     Assessment/Plan:  81 y.o. male with a known history of Parkinson's disease, diabetes type 2, hypertension, chronic atrial fibrillation on Coumadin, congestive heart failure presents with acute onset bilateral lower extremity weakness which started 5 days ago. As per family pt woke up with sudden onset of weakness.  Usually ambulates with walker or cane.   He was initially seen in the emergency room diagnosed with urinary tract infection and started on Keflex but symptoms have not improved. Found to be  hyperglycemic with blood sugars in the 400s.  I don't think this is Parkinsonian related as this is to fast of progression of symptoms He is unable to obtain MRI do to spinal stimulator.  Most of his weakness is proximal in his LE. His reflexes are nearly absent in this legs but he does have knee replacements and chronic DM.   This could be diabetic related neuropathy vs. GBS both of which are slow progressing in nature Would like LP but pt is on coumadin with INR of 3.96. Will d/w Dr.  D/w Dr. Marcello Fennel. He needs LP Will order Vitamin K x 3 days. Hold coumadin.  Will hold off LP for now until INR <1.7 then can be done under IR.  I do not think this is emergent to reverse INR with Kaycentra or FFP at this  point unless symptoms worsen.   Will follow up.   02/26/2015, 3:56 PM

## 2015-02-26 NOTE — Progress Notes (Signed)
PROGRESS NOTE  CHIBUIKEM THANG ZOX:[id number] DOB: 1923-06-23 DOA: 02/25/2015 PCP: Barbette Reichmann, MD  Subjective  81 y/o male with hx of Parkinson's disease, Type 2 DM, HTN, Chronic A-fib CHF admitted with bilat lower extremity weakness. Recently was seen in ER with similar symptoms and treated with Keflexfro UTI Pt continues to be weak and somewhat confused  and family is unable to manage him at home Consultants: Neurology   Objective: BP 141/78 mmHg  Pulse 75  Temp(Src) 98.1 F (36.7 C) (Oral)  Resp 17  Ht 5' 9.5" (1.765 m)  Wt 101.923 kg (224 lb 11.2 oz)  BMI 32.72 kg/m2  SpO2 97%  Intake/Output Summary (Last 24 hours) at 02/26/15 1516 Last data filed at 02/26/15 0800  Gross per 24 hour  Intake    120 ml  Output      0 ml  Net    120 ml   Filed Weights   02/25/15 1443 02/25/15 2115 02/26/15 0500  Weight: 107.956 kg (238 lb) 101.515 kg (223 lb 12.8 oz) 101.923 kg (224 lb 11.2 oz)    Exam:  General: Elderly male, in NAD HEENT: PERRL; OP moist without lesions. Neck: supple, trachea midline, no thyromegaly Chest: normal to palpation Lungs: clear bilaterally without retractions or wheezes Cardiovascular: RRR, no murmur, no gallop; distal pulses 2+ Abdomen: soft, nontender, nondistended, positive bowel sounds Extremities: no clubbing, cyanosis, edema Neuro: alert and oriented, moves all extremities. Leg weakness  Derm: no significant rashes or nodules; good skin turgor Lymph: no cervical or supraclavicular lymphadenopathy   Labs and imaging studies were reviewed*  Data Reviewed: Basic Metabolic Panel:  Recent Labs Lab 02/21/15 1017 02/25/15 1446 02/26/15 0453  NA 140 137 139  K 4.2 3.4* 3.2*  CL 102 98* 103  CO2 GLUCOSE 195* 427* 196*  BUN CREATININE 0.85 1.13 0.85  CALCIUM 8.9 8.5* 8.0*   Liver Function Tests:  Recent Labs Lab 02/25/15 1446  AST 23  ALT 6*  ALKPHOS 69  BILITOT 1.5*  PROT 6.9  ALBUMIN 3.0*   No  results for input(s): LIPASE, AMYLASE in the last 168 hours. No results for input(s): AMMONIA in the last 168 hours. CBC:  Recent Labs Lab 02/21/15 1017 02/25/15 1446 02/26/15 0453  WBC 7.6 8.8 7.1  HGB 14.2 12.9* 12.6*  HCT 42.9 39.4* 37.4*  MCV 91.5 91.1 91.8  PLT 149* 192 179   Cardiac Enzymes:   No results for input(s): CKTOTAL, CKMB, CKMBINDEX, TROPONINI in the last 168 hours. BNP (last 3 results) No results for input(s): BNP in the last 8760 hours.  ProBNP (last 3 results) No results for input(s): PROBNP in the last 8760 hours.  CBG:  Recent Labs Lab 02/25/15 1944 02/25/15 2119 02/26/15 0147 02/26/15 0738 02/26/15 1128  GLUCAP 238* 238* 163* 214* 258*    Recent Results (from the past 240 hour(s))  Urine culture     Status: None (Preliminary result)   Collection Time: 02/25/15  2:46 PM  Result Value Ref Range Status   Specimen Description URINE, RANDOM  Final   Special Requests Normal  Final   Culture TOO YOUNG TO READ  Final   Report Status PENDING  Incomplete     Studies: Dg Chest 1 View  02/25/2015  CLINICAL DATA:  81 year old male with altered mental status. EXAM: CHEST 1 VIEW COMPARISON:  02/21/2015 and prior exams FINDINGS: Moderate to marked cardiomegaly and mitral valve replacement again noted. Pulmonary vascular congestion is  present. There is no evidence of focal airspace disease, pulmonary edema, suspicious pulmonary nodule/mass, pleural effusion, or pneumothorax. No acute bony abnormalities are identified. Remote left-sided rib fractures are present. IMPRESSION: Cardiomegaly with pulmonary vascular congestion. Electronically Signed   By: Harmon PierJeffrey  Hu M.D.   On: 02/25/2015 16:46   Ct Head Wo Contrast  02/25/2015  CLINICAL DATA:  Altered mental status. EXAM: CT HEAD WITHOUT CONTRAST TECHNIQUE: Contiguous axial images were obtained from the base of the skull through the vertex without intravenous contrast. COMPARISON:  CT scan of February 22, 2015.  FINDINGS: Fluid is again noted in the right mastoid air cells. Bony calvarium is otherwise intact. Moderate diffuse cortical atrophy is noted. Mild chronic ischemic white matter disease is noted. Old lacunar infarction is noted in right basal ganglia. No mass effect or midline shift is noted. Ventricular size is within normal limits. There is no evidence of mass lesion, hemorrhage or acute infarction. IMPRESSION: Moderate diffuse cortical atrophy. Mild chronic ischemic white matter disease. No acute intracranial abnormality seen. Electronically Signed   By: Lupita RaiderJames  Green Jr, M.D.   On: 02/25/2015 17:44   Ct Chest W Contrast  02/25/2015  CLINICAL DATA:  Generalized weakness and decreased appetite with cough EXAM: CT CHEST, ABDOMEN, AND PELVIS WITH CONTRAST TECHNIQUE: Multidetector CT imaging of the chest, abdomen and pelvis was performed following the standard protocol during bolus administration of intravenous contrast. CONTRAST:  125mL OMNIPAQUE IOHEXOL 300 MG/ML  SOLN COMPARISON:  06/05/2014 FINDINGS: CT CHEST FINDINGS Mediastinum/Nodes: The thoracic aorta is within normal limits without evidence of aneurysmal dilatation or dissection. No mediastinal hematoma is seen. The origins of the brachiocephalic vessels are within normal limits. Pulmonary artery shows no definitive pulmonary emboli although timing was not performed for embolus evaluation. No significant hilar or mediastinal adenopathy is noted. Mild coronary calcifications are seen. Lungs/Pleura: The lungs are well aerated bilaterally. No focal infiltrate or sizable effusion is noted. No sizable pneumothorax is noted. Musculoskeletal: Healing rib fractures are noted on the left degenerative changes of thoracic spine are noted. CT ABDOMEN PELVIS FINDINGS Hepatobiliary: Gallbladder has been surgically removed. The liver is within normal limits. Pancreas: Mildly atrophic.  No focal mass lesion is seen. Spleen: Within normal limits. Adrenals/Urinary Tract:  The adrenal glands are unremarkable. The kidneys demonstrate hypodensities bilaterally consistent with cysts. No renal calculi or obstructive changes are noted. The largest of the cysts on the left measures 7.9 cm. The largest on the right measures approximately 6 cm. Bladder is well distended. Stomach/Bowel: Scattered diverticular changes noted without evidence of diverticulitis. The appendix is within normal limits. Vascular/Lymphatic: Aortoiliac calcifications are seen without aneurysmal dilatation. No significant lymphadenopathy is identified. Reproductive: Prostatic calcifications are noted. Other: A stimulation device is noted in the left hemipelvis. Musculoskeletal: Degenerative changes of the lumbar spine are seen. IMPRESSION: CT of the chest:  No acute abnormality identified. CT of the abdomen and pelvis:  Multiple renal cysts. Diverticular change without diverticulitis No acute abnormality is noted. Electronically Signed   By: Alcide CleverMark  Lukens M.D.   On: 02/25/2015 17:51   Ct Abdomen Pelvis W Contrast  02/25/2015  CLINICAL DATA:  Generalized weakness and decreased appetite with cough EXAM: CT CHEST, ABDOMEN, AND PELVIS WITH CONTRAST TECHNIQUE: Multidetector CT imaging of the chest, abdomen and pelvis was performed following the standard protocol during bolus administration of intravenous contrast. CONTRAST:  125mL OMNIPAQUE IOHEXOL 300 MG/ML  SOLN COMPARISON:  06/05/2014 FINDINGS: CT CHEST FINDINGS Mediastinum/Nodes: The thoracic aorta is within normal limits without evidence  of aneurysmal dilatation or dissection. No mediastinal hematoma is seen. The origins of the brachiocephalic vessels are within normal limits. Pulmonary artery shows no definitive pulmonary emboli although timing was not performed for embolus evaluation. No significant hilar or mediastinal adenopathy is noted. Mild coronary calcifications are seen. Lungs/Pleura: The lungs are well aerated bilaterally. No focal infiltrate or sizable  effusion is noted. No sizable pneumothorax is noted. Musculoskeletal: Healing rib fractures are noted on the left degenerative changes of thoracic spine are noted. CT ABDOMEN PELVIS FINDINGS Hepatobiliary: Gallbladder has been surgically removed. The liver is within normal limits. Pancreas: Mildly atrophic.  No focal mass lesion is seen. Spleen: Within normal limits. Adrenals/Urinary Tract: The adrenal glands are unremarkable. The kidneys demonstrate hypodensities bilaterally consistent with cysts. No renal calculi or obstructive changes are noted. The largest of the cysts on the left measures 7.9 cm. The largest on the right measures approximately 6 cm. Bladder is well distended. Stomach/Bowel: Scattered diverticular changes noted without evidence of diverticulitis. The appendix is within normal limits. Vascular/Lymphatic: Aortoiliac calcifications are seen without aneurysmal dilatation. No significant lymphadenopathy is identified. Reproductive: Prostatic calcifications are noted. Other: A stimulation device is noted in the left hemipelvis. Musculoskeletal: Degenerative changes of the lumbar spine are seen. IMPRESSION: CT of the chest:  No acute abnormality identified. CT of the abdomen and pelvis:  Multiple renal cysts. Diverticular change without diverticulitis No acute abnormality is noted. Electronically Signed   By: Alcide Clever M.D.   On: 02/25/2015 17:51    Scheduled Meds: . carbidopa-levodopa  1 tablet Oral TID  . furosemide  20 mg Oral Daily  . insulin aspart  0-5 Units Subcutaneous QHS  . insulin aspart  0-9 Units Subcutaneous TID WC  . lisinopril  40 mg Oral Daily  . metoprolol succinate  50 mg Oral Daily  . Warfarin - Pharmacist Dosing Inpatient   Does not apply q1800    Continuous Infusions:   Assessment/Plan:  1Parkinson's disease/  Bilat   Lower extremity weakness: Unclear etiology On Sinemet  CT Head: diffuse cortical atrophy and small vessel ischemic changes  Await Neuro input   2 Type 2 DM/ Hyperglycemia: Hold Metformin-On  SSI 3 A-fib- On Coumadin- Continue Metoprolol 4 HTN; Stable- Continue Lisinopril  5 Diastolic Dysfunction: Continue Lasix 6 Hypokalemia; Replace  7 Physical Therapy   Code Status: Full  Family Communication: wife       02/26/2015, 3:16 PM

## 2015-02-26 NOTE — Progress Notes (Signed)
Spoke with Scottie in MRI and pt is not a candidate for MRI because of bladder stimulator. Order was D/C'd.

## 2015-02-26 NOTE — Progress Notes (Signed)
ANTICOAGULATION CONSULT NOTE - Initial Consult  Pharmacy Consult for warfarin dosing Indication: atrial fibrillation  No Known Allergies  Patient Measurements: Height: 5' 9.5" (176.5 cm) Weight: 224 lb 11.2 oz (101.923 kg) IBW/kg (Calculated) : 71.85   Vital Signs: Temp: 98.6 F (37 C) (10/24 0533) Temp Source: Oral (10/24 0533) BP: 134/89 mmHg (10/24 0930) Pulse Rate: 78 (10/24 0930)  Labs:  Recent Labs  02/25/15 1446 02/26/15 0453  HGB 12.9* 12.6*  HCT 39.4* 37.4*  PLT 192 179  LABPROT  --  38.6*  INR  --  3.96  CREATININE 1.13 0.85    Estimated Creatinine Clearance: 69.9 mL/min (by C-G formula based on Cr of 0.85).   Medical History: Past Medical History  Diagnosis Date  . Diabetes mellitus without complication (HCC)   . Hypertension   . UTI (lower urinary tract infection)   . Parkinson's disease (HCC)   . TIA (transient ischemic attack)   . Atrial fibrillation (HCC)   . CHF (congestive heart failure) (HCC)   . Stroke (HCC)   . Hypothyroidism     Medications:  Scheduled:  . carbidopa-levodopa  1 tablet Oral TID  . furosemide  20 mg Oral Daily  . insulin aspart  0-5 Units Subcutaneous QHS  . insulin aspart  0-9 Units Subcutaneous TID WC  . lisinopril  40 mg Oral Daily  . metoprolol succinate  50 mg Oral Daily  . Warfarin - Pharmacist Dosing Inpatient   Does not apply q1800    Assessment: Pharmacy consulted to dose warfarin for 81 yo male on warfarin for afib. Patient takes warfarin 6mg  Monday-Friday and warfarin 5mg  Saturday&Sunday.   Goal of Therapy:  INR 2-3 Monitor platelets by anticoagulation protocol: Yes   Plan:  Patient with elevated INR. Will hold warfarin today and obtain follow-up INR with am labs.   Pharmacy will continue to monitor and adjust per consult.    Lauren Aguayo L 02/26/2015,9:52 AM

## 2015-02-26 NOTE — Progress Notes (Signed)
PT Cancellation Note  Patient Details Name: Troy [name] MRN: [id number] DOB: [birth year]   Cancelled Treatment:    Reason Eval/Treat Not Completed: Medical issues which prohibited therapy (Chart reviewed for evaluation.  Noted with orders for strict bedrest.  Per discussion with RN, prefers to maintain until rounding physician or neuro clears.  Will hold at this time and re-attempt as patient appropriate for activity.)   Troy [name], PT, DPT, NCS 02/26/2015, 9:26 AM [phone]

## 2015-02-26 NOTE — Care Management (Signed)
Spoke with patient spouse who was present in the room . Patient has been living at home with full time sitter and spouse stated that he is never left unsupervised. Patient has been walking at home with a walker until the last week or so when the spouse stated that he took a turn for the worse. Patient has not been ambulating since. Spouse stated that the patient has a hospital bed, a lift chair and wheel chair. He has home O2 provided by [facility]. Patient was open to [facility] prior to being to admitted the hospital. Anticipate that patient may need SNF upon discharge. PT evaluation pending Neuro exam. Continue to follow.

## 2015-02-27 DIAGNOSIS — R29898 Other symptoms and signs involving the musculoskeletal system: Secondary | ICD-10-CM | POA: Diagnosis not present

## 2015-02-27 LAB — PROTIME-INR
INR: 1.62
Prothrombin Time: 19.4 seconds — ABNORMAL HIGH (ref 11.4–15.0)

## 2015-02-27 LAB — URINE CULTURE
Culture: NO GROWTH
Special Requests: NORMAL

## 2015-02-27 LAB — BASIC METABOLIC PANEL
ANION GAP: 7 (ref 5–15)
BUN: 14 mg/dL (ref 6–20)
CHLORIDE: 100 mmol/L — AB (ref 101–111)
CO2: 30 mmol/L (ref 22–32)
CREATININE: 0.82 mg/dL (ref 0.61–1.24)
Calcium: 8.2 mg/dL — ABNORMAL LOW (ref 8.9–10.3)
GFR calc Af Amer: >60 mL/min (ref >60)
GFR calc non Af Amer: >60 mL/min (ref >60)
GLUCOSE: 238 mg/dL — AB (ref 65–99)
Potassium: 3.5 mmol/L (ref 3.5–5.1)
Sodium: 137 mmol/L (ref 135–145)

## 2015-02-27 LAB — CBC WITH DIFFERENTIAL/PLATELET
BASOS ABS: 0.1 10*3/uL (ref 0–0.1)
Basophils Relative: 1 %
Eosinophils Absolute: 0.4 10*3/uL (ref 0–0.7)
Eosinophils Relative: 6 %
HEMATOCRIT: 35.7 % — AB (ref 40.0–52.0)
Hemoglobin: 12 g/dL — ABNORMAL LOW (ref 13.0–18.0)
LYMPHS PCT: 19 %
Lymphs Abs: 1.2 10*3/uL (ref 1.0–3.6)
MCH: 30.3 pg (ref 26.0–34.0)
MCHC: 33.7 g/dL (ref 32.0–36.0)
MCV: 89.9 fL (ref 80.0–100.0)
MONO ABS: 0.7 10*3/uL (ref 0.2–1.0)
Monocytes Relative: 10 %
NEUTROS ABS: 4.1 10*3/uL (ref 1.4–6.5)
Neutrophils Relative %: 64 %
Platelets: 179 10*3/uL (ref 150–440)
RBC: 3.97 MIL/uL — AB (ref 4.40–5.90)
RDW: 13.4 % (ref 11.5–14.5)
WBC: 6.4 10*3/uL (ref 3.8–10.6)

## 2015-02-27 LAB — VITAMIN B12: VITAMIN B 12: 161 pg/mL — AB (ref 180–914)

## 2015-02-27 LAB — FOLATE: Folate: 8.1 ng/mL (ref >5.9)

## 2015-02-27 LAB — RPR: RPR Ser Ql: NONREACTIVE

## 2015-02-27 LAB — GLUCOSE, CAPILLARY
GLUCOSE-CAPILLARY: 313 mg/dL — AB (ref 65–99)
GLUCOSE-CAPILLARY: 293 mg/dL — AB (ref 65–99)
GLUCOSE-CAPILLARY: 267 mg/dL — AB (ref 65–99)
Glucose-Capillary: 228 mg/dL — ABNORMAL HIGH (ref 65–99)

## 2015-02-27 LAB — FERRITIN: Ferritin: 212 ng/mL (ref 24–336)

## 2015-02-27 MED ORDER — WARFARIN SODIUM 5 MG PO TABS: 5.0000 mg | ORAL_TABLET | Freq: Once | ORAL | Status: DC

## 2015-02-27 MED ORDER — WARFARIN - PHYSICIAN DOSING INPATIENT
Freq: Every day | Status: DC
  Administered 2015-02-28: 18:00:00

## 2015-02-27 MED ORDER — WARFARIN SODIUM 5 MG PO TABS
6.0000 mg | ORAL_TABLET | Freq: Once | ORAL | Status: AC
  Administered 2015-02-27: 6 mg via ORAL
  Filled 2015-02-27: qty 1

## 2015-02-27 MED ORDER — INSULIN GLARGINE 100 UNIT/ML ~~LOC~~ SOLN
10.0000 [IU] | Freq: Every day | SUBCUTANEOUS | Status: DC
  Administered 2015-02-27 – 2015-02-28 (×2): 10 [IU] via SUBCUTANEOUS
  Filled 2015-02-27 (×4): qty 0.1

## 2015-02-27 NOTE — Progress Notes (Signed)
Initial Nutrition Assessment  DOCUMENTATION CODES:      INTERVENTION:  Meals and snacks: Cater to pt preferences Nutrition diet education: Discussed goods sources of protein and current diet restrictions.  Wife and pt familiar with diet restrictions.  No education needed at this time   NUTRITION DIAGNOSIS:   Inadequate oral intake related to acute illness as evidenced by per patient/family report.     GOAL:   Patient will meet greater than or equal to 90% of their needs     MONITOR:    (Energy intake, Electrolyte and renal profile, Glucose profile)  REASON FOR ASSESSMENT:   Malnutrition Screening Tool    ASSESSMENT:      Pt admitted with UTI, hyperglycemia, bilateral lower extremity weakness  Past Medical History  Diagnosis Date  . Diabetes mellitus without complication (HCC)   . Hypertension   . UTI (lower urinary tract infection)   . Parkinson's disease (HCC)   . TIA (transient ischemic attack)   . Atrial fibrillation (HCC)   . CHF (congestive heart failure) (HCC)   . Stroke (HCC)   . Hypothyroidism     Current Nutrition: ate 100% of lunch today per pt and wife.  Noted intake 100%, 75%, 25% on admission  Food/Nutrition-Related History: Wife reports decreased intake for the past week prior to admission. Typically eating good breakfast, no lunch and small dinner meal.    Medications: lasix, aspart, lantus, miralax   Electrolyte/Renal Profile and Glucose Profile:   Recent Labs Lab 02/25/15 1446 02/26/15 0453 02/27/15 0701  NA 137 139 137  K 3.4* 3.2* 3.5  CL 98* 103 100*  CO2 28 30 30   BUN 17 12 14   CREATININE 1.13 0.85 0.82  CALCIUM 8.5* 8.0* 8.2*  GLUCOSE 427* 196* 238*   Protein Profile:  Recent Labs Lab 02/25/15 1446  ALBUMIN 3.0*    Gastrointestinal Profile: Last BM: 10/25   Nutrition-Focused Physical Exam Findings: Nutrition-Focused physical exam completed. Findings are WDL for fat depletion, muscle depletion, and edema.      Weight Change: stable wt per wife, noted wt gain per wt encounters   Diet Order:  Diet heart healthy/carb modified Room service appropriate?: Yes; Fluid consistency:: Thin  Skin:   reviewed   Height:   Ht Readings from Last 1 Encounters:  02/25/15 5' 9.5" (1.765 m)    Weight:   Wt Readings from Last 1 Encounters:  02/27/15 224 lb 11.2 oz (101.923 kg)    Ideal Body Weight:     BMI:  Body mass index is 32.72 kg/(m^2).   EDUCATION NEEDS:   No education needs identified at this time  LOW Care Level  Trinisha Paget B. Freida BusmanAllen, RD, LDN [phone] (pager)

## 2015-02-27 NOTE — Consult Note (Signed)
CC: b/l LE weakness   HPI: Troy [name] is an 81 y.o. male with a known history of Parkinson's disease, diabetes type 2, hypertension, chronic atrial fibrillation on Coumadin, congestive heart failure presents with acute onset bilateral lower extremity weakness which started 5 days ago. As per family pt woke up with sudden onset of weakness.  Usually ambulates with walker or cane.   He was initially seen in the emergency room diagnosed with urinary tract infection and started on Keflex but symptoms have not improved. Found to be  hyperglycemic with blood sugars in the 400s.  Pt worked with PT today and is doing much better was able to stand up with limited PT assitance.   Past Medical History  Diagnosis Date  . Diabetes mellitus without complication (HCC)   . Hypertension   . UTI (lower urinary tract infection)   . Parkinson's disease (HCC)   . TIA (transient ischemic attack)   . Atrial fibrillation (HCC)   . CHF (congestive heart failure) (HCC)   . Stroke (HCC)   . Hypothyroidism     Past Surgical History  Procedure Laterality Date  . Cardiac surgery    . Cholecystectomy      Family History  Problem Relation Age of Onset  . Hypertension Mother   . Stroke Mother   . CAD Father   . Lymphoma Sister   . Lung disease Brother     Social History:  reports that he has quit smoking. He has never used smokeless tobacco. He reports that he does not drink alcohol or use illicit drugs.  No Known Allergies  Medications: I have reviewed the patient's current medications.  ROS: History obtained from the patient  General ROS: negative for - chills, fatigue, fever, night sweats, weight gain or weight loss Psychological ROS: negative for - behavioral disorder, hallucinations, memory difficulties, mood swings or suicidal ideation Ophthalmic ROS: negative for - blurry vision, double vision, eye pain or loss of vision ENT ROS: negative for - epistaxis, nasal discharge, oral lesions,  sore throat, tinnitus or vertigo Allergy and Immunology ROS: negative for - hives or itchy/watery eyes Hematological and Lymphatic ROS: negative for - bleeding problems, bruising or swollen lymph nodes Endocrine ROS: negative for - galactorrhea, hair pattern changes, polydipsia/polyuria or temperature intolerance Respiratory ROS: negative for - cough, hemoptysis, shortness of breath or wheezing Cardiovascular ROS: negative for - chest pain, dyspnea on exertion, edema or irregular heartbeat Gastrointestinal ROS: negative for - abdominal pain, diarrhea, hematemesis, nausea/vomiting or stool incontinence Genito-Urinary ROS: negative for - incontinent with spinal stimulator Musculoskeletal ROS: negative for - joint swelling or muscular weakness Neurological ROS: as noted in HPI Dermatological ROS: negative for rash and skin lesion changes  Physical Examination: Blood pressure 153/79, pulse 76, temperature 97.7 F (36.5 C), temperature source Oral, resp. rate 23, height 5' 9.5" (1.765 m), weight 224 lb 11.2 oz (101.923 kg), SpO2 93 %.   Neurological Examination Mental Status: Alert, oriented, thought content appropriate.  Speech fluent without evidence of aphasia.  Able to follow 3 step commands without difficulty. Cranial Nerves: II: Discs flat bilaterally; Visual fields grossly normal, pupils equal, round, reactive to light and accommodation III,IV, VI: ptosis not present, extra-ocular motions intact bilaterally V,VII: smile symmetric, facial light touch sensation normal bilaterally VIII: hearing normal bilaterally IX,X: gag reflex present XI: bilateral shoulder shrug XII: midline tongue extension Motor: Right : Upper extremity   5/5    Left:     Upper extremity   5/5  LE: b/l pt is weak proximally with strength  Improved 4/5 but he is stronger distally at 4+/5.  Tone and bulk:normal tone throughout; no atrophy noted Sensory: Pinprick and light touch intact but does have glove/stocking  sensory deficits.  Deep Tendon Reflexes: absent in LE but are present in his upper extremities.   Plantars: Right: downgoing   Left: downgoing Cerebellar: normal finger-to-nose, normal rapid alternating movements and normal heel-to-shin test Gait: normal gait and station      Laboratory Studies:   Basic Metabolic Panel:  Recent Labs Lab 02/21/15 1017 02/25/15 1446 02/26/15 0453 02/27/15 0701  NA 140 137 139 137  K 4.2 3.4* 3.2* 3.5  CL 102 98* 103 100*  CO2 GLUCOSE 195* 427* 196* 238*  BUN CREATININE 0.85 1.13 0.85 0.82  CALCIUM 8.9 8.5* 8.0* 8.2*    Liver Function Tests:  Recent Labs Lab 02/25/15 1446  AST 23  ALT 6*  ALKPHOS 69  BILITOT 1.5*  PROT 6.9  ALBUMIN 3.0*   No results for input(s): LIPASE, AMYLASE in the last 168 hours. No results for input(s): AMMONIA in the last 168 hours.  CBC:  Recent Labs Lab 02/21/15 1017 02/25/15 1446 02/26/15 0453 02/27/15 0701  WBC 7.6 8.8 7.1 6.4  NEUTROABS  --   --   --  4.1  HGB 14.2 12.9* 12.6* 12.0*  HCT 42.9 39.4* 37.4* 35.7*  MCV 91.5 91.1 91.8 89.9  PLT 149* 192 179 179    Cardiac Enzymes: No results for input(s): CKTOTAL, CKMB, CKMBINDEX, TROPONINI in the last 168 hours.  BNP: Invalid input(s): POCBNP  CBG:  Recent Labs Lab 02/26/15 2222 02/27/15 0751 02/27/15 1132 02/27/15 1619 02/27/15 2117  GLUCAP 238* 228* 313* 293* 267*    Microbiology: Results for orders placed or performed during the hospital encounter of 02/25/15  Culture, blood (routine x 2)     Status: None (Preliminary result)   Collection Time: 02/25/15  2:46 PM  Result Value Ref Range Status   Specimen Description BLOOD RIGHT AC  Final   Special Requests   Final    BOTTLES DRAWN AEROBIC AND ANAEROBIC  AER 6CC ANA 3CC   Culture NO GROWTH 2 DAYS  Final   Report Status PENDING  Incomplete  Culture, blood (routine x 2)     Status: None (Preliminary result)   Collection Time: 02/25/15  2:46 PM   Result Value Ref Range Status   Specimen Description BLOOD RIGHT HAND  Final   Special Requests   Final    BOTTLES DRAWN AEROBIC AND ANAEROBIC  AER 7CC ANA 4CC   Culture NO GROWTH 2 DAYS  Final   Report Status PENDING  Incomplete  Urine culture     Status: None   Collection Time: 02/25/15  2:46 PM  Result Value Ref Range Status   Specimen Description URINE, RANDOM  Final   Special Requests Normal  Final   Culture NO GROWTH 2 DAYS  Final   Report Status 02/27/2015 FINAL  Final    Coagulation Studies:  Recent Labs  02/26/15 0453 02/27/15 0701  LABPROT 38.6* 19.4*  INR 3.96 1.62    Urinalysis:   Recent Labs Lab 02/21/15 1113 02/25/15 1446  COLORURINE YELLOW* YELLOW*  LABSPEC 1.021 1.023  PHURINE 6.0 5.0  GLUCOSEU 50* >500*  HGBUR 2+* 3+*  BILIRUBINUR NEGATIVE NEGATIVE  KETONESUR TRACE* TRACE*  PROTEINUR 100* 100*  NITRITE NEGATIVE NEGATIVE  LEUKOCYTESUR TRACE* NEGATIVE  Lipid Panel:  No results found for: CHOL, TRIG, HDL, CHOLHDL, VLDL, LDLCALC  HgbA1C:  Lab Results  Component Value Date   HGBA1C 9.8* 02/25/2015    Urine Drug Screen:  No results found for: LABOPIA, COCAINSCRNUR, LABBENZ, AMPHETMU, THCU, LABBARB  Alcohol Level: No results for input(s): ETH in the last 168 hours.   Imaging: No results found.   Assessment/Plan:  81 y.o. male with a known history of Parkinson's disease, diabetes type 2, hypertension, chronic atrial fibrillation on Coumadin, congestive heart failure presents with acute onset bilateral lower extremity weakness which started 5 days ago. As per family pt woke up with sudden onset of weakness.  Usually ambulates with walker or cane.   He was initially seen in the emergency room diagnosed with urinary tract infection and started on Keflex but symptoms have not improved. Found to be  hyperglycemic with blood sugars in the 400s.   Today much better, strength of his LE has improved and was able to stand up with limited PT.   This  goes against the GBS diagnosis.   Would not persue aggressive testing such as LP at this point.  Possibly UTI related and deconditioning.  Was able to ambulate with walker.  He does at baseline use a cane or walker    Will restart coumadin for history of a-fib.  D/c planning with home PT Pauletta Browns   02/27/2015, 9:37 PM

## 2015-02-27 NOTE — Progress Notes (Signed)
Spoke with Dr. Marcello FennelHande by phone regarding activity and PT orders.  Current order is strict bed rest.  New orders received and entered.

## 2015-02-27 NOTE — Care Management (Signed)
Patient ambulated with PT using walker and recommendation is for Home Health PT.  Patient was open to [facility] prior to admission and spouse would like to continue with this provider.  [facility] Will need HH orders upon patient discharge. Continue to follow

## 2015-02-27 NOTE — Progress Notes (Signed)
Inpatient Diabetes Program Recommendations  AACE/ADA: New Consensus Statement on Inpatient Glycemic Control (2015)  Target Ranges:  Prepandial:   less than 140 mg/dL      Peak postprandial:   less than 180 mg/dL (1-2 hours)      Critically ill patients:  140 - 180 mg/dL  Results for Troy [name], Troy [name] (MRN [id number]) as of 02/27/2015 09:15  Ref. Range 02/26/2015 07:38 02/26/2015 11:28 02/26/2015 16:33 02/26/2015 22:22 02/27/2015 07:51  Glucose-Capillary Latest Ref Range: 65-99 mg/dL 308214 (H) 657258 (H) 846221 (H) 238 (H) 228 (H)    Review of Glycemic Control  Diabetes history: DM2 Outpatient Diabetes medications: Humulin R U500 4 units with breakfast and supper, Lantus 40 units QHS, Glipizide 10 mg QAM, Tradjenat 5 mg daily, Metformin XR 500 mg QAM Current orders for Inpatient glycemic control: Novolog 0-9 units TID with meals, Novolog 0-5 units HS  Inpatient Diabetes Program Recommendations: Insulin - Basal: Glucose ranged from 214-258 mg/dl on 96/29/5210/24/16 and fasting glucose is 228 mg/dl this morning.  Please consider ordering low dose basal insulin. Recommend starting with at least Lantus 10 units Q24H starting now. HgbA1C: A1C 9.8% on 02/25/15 indicating poor glycemic control over the past 2-3 months. Inpatient Referral: May want to consider consulting Dr. Tedd SiasSolum to follow while inpatient to assist with inpatient glycemic control.  NOTE:Noted in care everywhere tab that patient is followed by Dr. Tedd SiasSolum and was last seen by Dr. Tedd SiasSolum on 01/05/2015 and patient is prescribed Humulin R U500 and Lantus as noted above in outpatient DM medications.    Thanks, Orlando PennerMarie Lukas Pelcher, RN, MSN, CCRN, CDE Diabetes Coordinator Inpatient Diabetes Program [phone] (Team Pager from 8am to 5pm) [phone] ([facility] office) [phone] [facility] office) [phone] [facility] office)

## 2015-02-27 NOTE — Progress Notes (Signed)
PROGRESS NOTE  Troy BurnRobert S Caudillo ZHY:[id number] DOB: 05/23/1923 DOA: 02/25/2015 PCP: Barbette ReichmannHANDE,Natajah Derderian, MD  Subjective  81 y/o male with hx of Parkinson's disease, Type 2 DM, HTN, Chronic A-fib CHF admitted with bilat lower extremity weakness. Recently was seen in ER with similar symptoms and treated with Keflexfro UTI This am feels better and thinks leg strength has improved Consultants: Neurology   Objective: BP 144/82 mmHg  Pulse 79  Temp(Src) 97.4 F (36.3 C) (Oral)  Resp 18  Ht 5' 9.5" (1.765 m)  Wt 101.923 kg (224 lb 11.2 oz)  BMI 32.72 kg/m2  SpO2 99%  Intake/Output Summary (Last 24 hours) at 02/27/15 0825 Last data filed at 02/27/15 0016  Gross per 24 hour  Intake    480 ml  Output      0 ml  Net    480 ml   Filed Weights   02/25/15 2115 02/26/15 0500 02/27/15 0527  Weight: 101.515 kg (223 lb 12.8 oz) 101.923 kg (224 lb 11.2 oz) 101.923 kg (224 lb 11.2 oz)    Exam:  General: Elderly male, in NAD HEENT: PERRL; OP moist without lesions. Neck: supple, trachea midline, no thyromegaly Chest: normal to palpation Lungs: clear bilaterally without retractions or wheezes Cardiovascular: RRR, no murmur, no gallop; distal pulses 2+ Abdomen: soft, nontender, nondistended, positive bowel sounds Extremities: no clubbing, cyanosis, edema Neuro: alert and oriented, moves all extremities. Leg weakness  Derm: no significant rashes or nodules; good skin turgor Lymph: no cervical or supraclavicular lymphadenopathy   Labs and imaging studies were reviewed*  Data Reviewed: Basic Metabolic Panel:  Recent Labs Lab 02/21/15 1017 02/25/15 1446 02/26/15 0453 02/27/15 0701  NA 140 137 139 137  K 4.2 3.4* 3.2* 3.5  CL 102 98* 103 100*  CO2 30 28 30 30   GLUCOSE 195* 427* 196* 238*  BUN 15 17 12 14   CREATININE 0.85 1.13 0.85 0.82  CALCIUM 8.9 8.5* 8.0* 8.2*   Liver Function Tests:  Recent Labs Lab 02/25/15 1446  AST 23  ALT 6*  ALKPHOS 69  BILITOT 1.5*  PROT  6.9  ALBUMIN 3.0*   No results for input(s): LIPASE, AMYLASE in the last 168 hours. No results for input(s): AMMONIA in the last 168 hours. CBC:  Recent Labs Lab 02/21/15 1017 02/25/15 1446 02/26/15 0453 02/27/15 0701  WBC 7.6 8.8 7.1 6.4  NEUTROABS  --   --   --  4.1  HGB 14.2 12.9* 12.6* 12.0*  HCT 42.9 39.4* 37.4* 35.7*  MCV 91.5 91.1 91.8 89.9  PLT 149* 192 179 179   Cardiac Enzymes:   No results for input(s): CKTOTAL, CKMB, CKMBINDEX, TROPONINI in the last 168 hours. BNP (last 3 results) No results for input(s): BNP in the last 8760 hours.  ProBNP (last 3 results) No results for input(s): PROBNP in the last 8760 hours.  CBG:  Recent Labs Lab 02/26/15 0738 02/26/15 1128 02/26/15 1633 02/26/15 2222 02/27/15 0751  GLUCAP 214* 258* 221* 238* 228*    Recent Results (from the past 240 hour(s))  Urine culture     Status: None (Preliminary result)   Collection Time: 02/25/15  2:46 PM  Result Value Ref Range Status   Specimen Description URINE, RANDOM  Final   Special Requests Normal  Final   Culture TOO YOUNG TO READ  Final   Report Status PENDING  Incomplete     Studies: No results found.  Scheduled Meds: . carbidopa-levodopa  1 tablet Oral TID  . furosemide  20  mg Oral Daily  . insulin aspart  0-5 Units Subcutaneous QHS  . insulin aspart  0-9 Units Subcutaneous TID WC  . lisinopril  40 mg Oral Daily  . metoprolol succinate  50 mg Oral Daily    Continuous Infusions:   Assessment/Plan:  1Parkinson's disease/  Bilat  Lower extremity weakness: Unclear etiology On Sinemet  CT Head: diffuse cortical atrophy and small vessel ischemic changes  D/w Dr. Loretha Brasil- Hold Coumadin  For LP to r/o Guillain Barre Syndrome Diabetic Neuropathy also a possible diagnosis  2 Type 2 DM/ Hyperglycemia: Hold Metformin-On  SSI 3 A-fib- On Coumadin- Continue Metoprolol 4 HTN; Stable- Continue Lisinopril  5 Diastolic Dysfunction: Continue Lasix 6 Hypokalemia; Replace   7 Physical Therapy   Code Status: Full  Family Communication: wife       02/27/2015, 8:25 AM

## 2015-02-27 NOTE — Evaluation (Signed)
Physical Therapy Evaluation Patient Details Name: Troy [name] MRN: [id number] DOB: May 15, 1923 Today's Date: 02/27/2015   History of Present Illness  Patient is an 81 y/o male that presents with inability to ambulate and decreased LE strength over the course of the past week. Patient has Parkinson's disease, however he has been ambulating with RW or cane down driveway with caregiver. He uses O2 at home intermittently.   Clinical Impression  Patient is an 81 y/o male with a history of Parkinson's that has had an insidious onset of weakness and inability to ambulate. He typically ambulates household distances with a caregiver and assistive device. During the session today weakness noted, though it appears this is close to his baseline. No focal strength deficits identified in MMT, and patient requires very minimal assistance with sitting balance and sit to stand transfer (per spouse present this appears to be baseline for him). Patient is able to ambulate a household distance safely with RW and no loss of balance, though several gait abnormalities identified. Patient appears to have returned to at or near his baseline, and has regained his LE muscle strength compared to previous findings. Patient appears to have room for mobility improvement in independence and safety and would benefit from further PT services to increase his strength and balance. Thus skilled PT services are indicated to address the above mentioned mobility deficits.     Follow Up Recommendations Home health PT    Equipment Recommendations       Recommendations for Other Services       Precautions / Restrictions Precautions Precautions: Fall Restrictions Weight Bearing Restrictions: No      Mobility  Bed Mobility Overal bed mobility: Needs Assistance Bed Mobility: Supine to Sit     Supine to sit: Min guard;Min assist     General bed mobility comments: Patient is able to transfer to dangling with use of bed  rails, however he requires assistance to be brought to the edge of the bed.   Transfers Overall transfer level: Needs assistance Equipment used: Rolling walker (2 wheeled) Transfers: Sit to/from Stand Sit to Stand: +2 safety/equipment;Min assist         General transfer comment: PT called in for +2 assist given his poor sitting balance and history. Patient is able to stand with very minimal effort provided by PT, appropriate hand placement and no loss of balance.   Ambulation/Gait Ambulation/Gait assistance: Min guard Ambulation Distance (Feet): 175 Feet Assistive device: Rolling walker (2 wheeled) Gait Pattern/deviations: Decreased step length - right;Decreased step length - left;Trunk flexed     General Gait Details: Patient displays several transient freezing episodes during ambulation, but no loss of balance. Patient ambulates at varying speeds throughout ambulation and requires cuing for increased step length.   Stairs            Wheelchair Mobility    Modified Rankin (Stroke Patients Only)       Balance Overall balance assessment: Needs assistance Sitting-balance support: Bilateral upper extremity supported Sitting balance-Leahy Scale: Fair Sitting balance - Comments: Patient loses balance posteriorly during seated activities (MMT) and to the R. Per spouse this is normal for him in supported sitting. With chair backing, no loss of balance noted.  Postural control: Posterior lean;Right lateral lean Standing balance support: Bilateral upper extremity supported Standing balance-Leahy Scale: Fair Standing balance comment: Patient displays freezing episodes and variable gait speed, both representative of Parkinson's disease. No loss of balance during ambulation noted with RW, though given the former gait  abnormalities, he presents as a high falls risk.                              Pertinent Vitals/Pain Pain Assessment: No/denies pain    Home Living  Family/patient expects to be discharged to:: Private residence Living Arrangements: Spouse/significant other Available Help at Discharge: Family;Personal care attendant;Available 24 hours/day Type of Home: House Home Access: Stairs to enter   [employer] of Steps: 3 (With rails, did not specify which side)   Home Equipment: Walker - 2 wheels;Cane - single point;Wheelchair - manual;Hospital bed;Toilet riser      Prior Function Level of Independence: Independent with assistive device(s)         Comments: Per spouse patient has had 1 fall in the past year. Patient typically ambulates household distances with caregiver.      Hand Dominance        Extremity/Trunk Assessment   Upper Extremity Assessment: Overall WFL for tasks assessed           Lower Extremity Assessment: Overall WFL for tasks assessed (Perhaps slight R hip flexor weakness, 4+/5. No other deficits found at ankles, knees, or hips. )         Communication   Communication: No difficulties  Cognition Arousal/Alertness: Awake/alert Behavior During Therapy: WFL for tasks assessed/performed;Flat affect Overall Cognitive Status: History of cognitive impairments - at baseline (Patient with history of Parkinson's and displays limited responses, this appears to be his baseline.)                      General Comments      Exercises        Assessment/Plan    PT Assessment Patient needs continued PT services  PT Diagnosis Difficulty walking;Generalized weakness   PT Problem List Decreased strength;Decreased mobility;Decreased activity tolerance;Decreased balance  PT Treatment Interventions Therapeutic activities;Therapeutic exercise;Gait training;Stair training;Balance training   PT Goals (Current goals can be found in the Care Plan section) Acute Rehab PT Goals Patient Stated Goal: To return home safely  PT Goal Formulation: With patient/family Time For Goal Achievement:  03/13/15 Potential to Achieve Goals: Good    Frequency Min 2X/week   Barriers to discharge Inaccessible home environment Patient will need to ascend 3 steps, which per wife has been getting more difficult.     Co-evaluation               End of Session Equipment Utilized During Treatment: Gait belt;Oxygen Activity Tolerance: Patient tolerated treatment well;Patient limited by fatigue Patient left: in chair;with chair alarm set;with family/visitor present;with call bell/phone within reach Nurse Communication: Mobility status    Functional Limitation: Mobility: Walking and moving around Mobility: Walking and Moving Around Current Status (R6045): At least 1 percent but less than 20 percent impaired, limited or restricted Mobility: Walking and Moving Around Goal Status [phone]): At least 1 percent but less than 20 percent impaired, limited or restricted    Time: [id number] PT Time Calculation (min) (ACUTE ONLY): 23 min   Charges:   PT Evaluation $Initial PT Evaluation Tier I: 1 Procedure     PT G Codes:   PT G-Codes **NOT FOR INPATIENT CLASS** Functional Limitation: Mobility: Walking and moving around Mobility: Walking and Moving Around Current Status (F6213): At least 1 percent but less than 20 percent impaired, limited or restricted Mobility: Walking and Moving Around Goal Status [phone]): At least 1 percent but less than 20 percent impaired,  limited or restricted   Kerin RansomPatrick A McNamara, PT, DPT    02/27/2015, 11:46 AM

## 2015-02-28 LAB — CBC WITH DIFFERENTIAL/PLATELET
Basophils Absolute: 0.1 10*3/uL (ref 0–0.1)
Basophils Relative: 1 %
EOS PCT: 5 %
Eosinophils Absolute: 0.3 10*3/uL (ref 0–0.7)
HCT: 35.7 % — ABNORMAL LOW (ref 40.0–52.0)
Hemoglobin: 11.9 g/dL — ABNORMAL LOW (ref 13.0–18.0)
LYMPHS ABS: 1.3 10*3/uL (ref 1.0–3.6)
LYMPHS PCT: 22 %
MCH: 30.2 pg (ref 26.0–34.0)
MCHC: 33.4 g/dL (ref 32.0–36.0)
MCV: 90.3 fL (ref 80.0–100.0)
MONO ABS: 0.5 10*3/uL (ref 0.2–1.0)
Monocytes Relative: 8 %
Neutro Abs: 3.8 10*3/uL (ref 1.4–6.5)
Neutrophils Relative %: 64 %
PLATELETS: 203 10*3/uL (ref 150–440)
RBC: 3.96 MIL/uL — ABNORMAL LOW (ref 4.40–5.90)
RDW: 13.3 % (ref 11.5–14.5)
WBC: 5.9 10*3/uL (ref 3.8–10.6)

## 2015-02-28 LAB — BASIC METABOLIC PANEL
Anion gap: 6 (ref 5–15)
BUN: 12 mg/dL (ref 6–20)
CALCIUM: 8.4 mg/dL — AB (ref 8.9–10.3)
CO2: 31 mmol/L (ref 22–32)
Chloride: 101 mmol/L (ref 101–111)
Creatinine, Ser: 0.83 mg/dL (ref 0.61–1.24)
GFR calc Af Amer: >60 mL/min (ref >60)
GLUCOSE: 217 mg/dL — AB (ref 65–99)
Potassium: 3.3 mmol/L — ABNORMAL LOW (ref 3.5–5.1)
Sodium: 138 mmol/L (ref 135–145)

## 2015-02-28 LAB — PROTIME-INR
INR: 1.36
Prothrombin Time: 17 seconds — ABNORMAL HIGH (ref 11.4–15.0)

## 2015-02-28 LAB — GLUCOSE, CAPILLARY
GLUCOSE-CAPILLARY: 293 mg/dL — AB (ref 65–99)
Glucose-Capillary: 216 mg/dL — ABNORMAL HIGH (ref 65–99)
Glucose-Capillary: 284 mg/dL — ABNORMAL HIGH (ref 65–99)
Glucose-Capillary: 259 mg/dL — ABNORMAL HIGH (ref 65–99)

## 2015-02-28 MED ORDER — WARFARIN SODIUM 5 MG PO TABS: 5.0000 mg | ORAL_TABLET | ORAL | Status: DC

## 2015-02-28 MED ORDER — WARFARIN SODIUM 5 MG PO TABS
6.0000 mg | ORAL_TABLET | ORAL | Status: DC
  Administered 2015-02-28: 6 mg via ORAL
  Filled 2015-02-28: qty 1

## 2015-02-28 MED ORDER — POTASSIUM CHLORIDE 20 MEQ PO PACK
20.0000 meq | PACK | Freq: Once | ORAL | Status: AC
  Administered 2015-02-28: 20 meq via ORAL
  Filled 2015-02-28: qty 1

## 2015-02-28 MED ORDER — POTASSIUM CHLORIDE CRYS ER 20 MEQ PO TBCR
20.0000 meq | EXTENDED_RELEASE_TABLET | Freq: Every day | ORAL | Status: DC
  Administered 2015-02-28 – 2015-03-01 (×2): 20 meq via ORAL
  Filled 2015-02-28 (×2): qty 1

## 2015-02-28 NOTE — Care Management (Signed)
Discussed case with Dr Marcello FennelHande. Family would like Rehab but patient does not meet a skillable need per Alva GarnetPatrick McNamara PT. Ambulated over 36400ft with walker. Dr Marcello FennelHande discussed PT evaluation with Luisa Hartatrick over the phone and then discussed with pt spouse and home health aid.  MD stated to me that family was "OK" with home health PT at this time.  [facility] to [facility] discuss case and inform of anticipated discharge tomorrow. Will need orders from Dr Marcello FennelHande for home health PT upon discharge. Spoke with spouse and HH aid in the room after MD had left floor who [facility], explained again that patient did not meet criteria for SNF/STR per PT at this time and that insurance would not pay for this.  [facility] per their preference for home Physical therapy.

## 2015-02-28 NOTE — Progress Notes (Signed)
Physical Therapy Treatment Patient Details Name: Troy BurnRobert S Rash MRN: [id number] DOB: November 25, 1923 Today's Date: 02/28/2015    History of Present Illness Patient is an 81 y/o male that presents with inability to ambulate and decreased LE strength over the course of the past week. Patient has Parkinson's disease, however he has been ambulating with RW or cane down driveway with caregiver. He uses O2 at home intermittently.     PT Comments    During this session patient displays much improved gait speed and mechanics and gives no indication of loss of balance throughout ambulation. He demonstrates mild decrease in gait speed with lateral head turns in ambulation, not with vertical head turns. He is able to change speeds appropriately and navigate turns in the hallway with no drifting noted. Patient continues to require assistance to complete standing (minimal) and per the family there is a lift chair at home to assist with sit to stand transfers. Patient does appear to be slightly below his baseline with bed mobility and sit to stand transfers and would benefit from HHPT to address these deficits.   Follow Up Recommendations  Home health PT     Equipment Recommendations       Recommendations for Other Services       Precautions / Restrictions Precautions Precautions: Fall Restrictions Weight Bearing Restrictions: No    Mobility  Bed Mobility Overal bed mobility: Needs Assistance Bed Mobility: Supine to Sit     Supine to sit: Min guard;Min assist     General bed mobility comments: Patient brings his legs off the side of the bed independently and uses bed rail appropriately, however he continues to require min A x1 to bring his trunk from the bed to upright.   Transfers Overall transfer level: Needs assistance Equipment used: Rolling walker (2 wheeled) Transfers: Sit to/from Stand Sit to Stand: Min assist         General transfer comment: Patient provides improved effort  during this session, bed is slightly raised and patient rocked forward with PT for transfer. No loss of balance noted.   Ambulation/Gait Ambulation/Gait assistance: Supervision Ambulation Distance (Feet): 375 Feet Assistive device: Rolling walker (2 wheeled) Gait Pattern/deviations: Step-through pattern;Trunk flexed   Gait velocity interpretation: at or above normal speed for age/gender General Gait Details: Patient demonstrates much improved gait speed during this session, no episodes of freezing, minimal but appropriate episodes of speed variation. Reciprocal step through pattern noted.    Stairs            Wheelchair Mobility    Modified Rankin (Stroke Patients Only)       Balance Overall balance assessment: Needs assistance Sitting-balance support: Bilateral upper extremity supported Sitting balance-Leahy Scale: Fair Sitting balance - Comments: Patient displays improved balance statically in sitting once he is upright. He continues to initially lose balance posteriorly and to the R during transition from supine to sitting.  Postural control: Posterior lean;Right lateral lean Standing balance support: Bilateral upper extremity supported Standing balance-Leahy Scale: Fair Standing balance comment: Patient displays modified DGI of 10/12 with RW and appropriate gait speed with appropriate foot clearance and no drifting/lateral deviations during ambulation.                     Cognition Arousal/Alertness: Awake/alert Behavior During Therapy: WFL for tasks assessed/performed;Flat affect Overall Cognitive Status: History of cognitive impairments - at baseline (Appears at baseline, more conversive than previous session)  Exercises General Exercises - Lower Extremity Long Arc Quad: AROM;10 reps;Both Heel Slides: AROM;Both;10 reps Straight Leg Raises: AROM;Both;10 reps Hip Flexion/Marching: AROM;Both;10 reps    General Comments         Pertinent Vitals/Pain Pain Assessment: No/denies pain    Home Living                      Prior Function            PT Goals (current goals can now be found in the care plan section) Acute Rehab PT Goals Patient Stated Goal: To return home safely  PT Goal Formulation: With patient/family Time For Goal Achievement: 03/13/15 Potential to Achieve Goals: Good Progress towards PT goals: Progressing toward goals    Frequency  Min 2X/week    PT Plan Current plan remains appropriate    Co-evaluation             End of Session Equipment Utilized During Treatment: Gait belt;Oxygen Activity Tolerance: Patient tolerated treatment well Patient left: in chair;with chair alarm set;with family/visitor present;with call bell/phone within reach     Time: 1137-1203 PT Time Calculation (min) (ACUTE ONLY): 26 min  Charges:  $Gait Training: 23-37 mins                    G Codes:      Kerin Ransom, PT, DPT    02/28/2015, 1:39 PM

## 2015-02-28 NOTE — Progress Notes (Signed)
PROGRESS NOTE  Troy [name] UJW:[id number] DOB: 1923/12/04 DOA: 02/25/2015 PCP: Barbette Reichmann, MD  Subjective  81 y/o male with hx of Parkinson's disease, Type 2 DM, HTN, Chronic A-fib CHF admitted with bilat lower extremity weakness. Recently was seen in ER with similar symptoms and treated with Keflexfro UTI This am states legs still weak  INR; 1.36 Consultants: Neurology   Objective: BP 157/62 mmHg  Pulse 81  Temp(Src) 98.1 F (36.7 C) (Oral)  Resp 20  Ht 5' 9.5" (1.765 m)  Wt 101.016 kg (222 lb 11.2 oz)  BMI 32.43 kg/m2  SpO2 99%  Intake/Output Summary (Last 24 hours) at 02/28/15 0826 Last data filed at 02/28/15 0720  Gross per 24 hour  Intake    720 ml  Output      0 ml  Net    720 ml   Filed Weights   02/26/15 0500 02/27/15 0527 02/28/15 0538  Weight: 101.923 kg (224 lb 11.2 oz) 101.923 kg (224 lb 11.2 oz) 101.016 kg (222 lb 11.2 oz)    Exam:  General: Elderly male, in NAD HEENT: PERRL; OP moist without lesions. Neck: supple, trachea midline, no thyromegaly Chest: normal to palpation Lungs: clear bilaterally without retractions or wheezes Cardiovascular: RRR, no murmur, no gallop; distal pulses 2+ Abdomen: soft, nontender, nondistended, positive bowel sounds Extremities: no clubbing, cyanosis, edema Neuro: alert and oriented, moves all extremities.  Bilat Leg weakness  Noted. No sensory loss Derm: no significant rashes or nodules; good skin turgor Lymph: no cervical or supraclavicular lymphadenopathy   Labs and imaging studies were reviewed*  Data Reviewed: Basic Metabolic Panel:  Recent Labs Lab 02/21/15 1017 02/25/15 1446 02/26/15 0453 02/27/15 0701 02/28/15 0445  NA 140 137 139 137 138  K 4.2 3.4* 3.2* 3.5 3.3*  CL 102 98* 103 100* 101  CO2 GLUCOSE 195* 427* 196* 238* 217*  BUN CREATININE 0.85 1.13 0.85 0.82 0.83  CALCIUM 8.9 8.5* 8.0* 8.2* 8.4*   Liver Function Tests:  Recent Labs Lab  02/25/15 1446  AST 23  ALT 6*  ALKPHOS 69  BILITOT 1.5*  PROT 6.9  ALBUMIN 3.0*   No results for input(s): LIPASE, AMYLASE in the last 168 hours. No results for input(s): AMMONIA in the last 168 hours. CBC:  Recent Labs Lab 02/21/15 1017 02/25/15 1446 02/26/15 0453 02/27/15 0701 02/28/15 0445  WBC 7.6 8.8 7.1 6.4 5.9  NEUTROABS  --   --   --  4.1 3.8  HGB 14.2 12.9* 12.6* 12.0* 11.9*  HCT 42.9 39.4* 37.4* 35.7* 35.7*  MCV 91.5 91.1 91.8 89.9 90.3  PLT 149* 192 179 179 203   Cardiac Enzymes:   No results for input(s): CKTOTAL, CKMB, CKMBINDEX, TROPONINI in the last 168 hours. BNP (last 3 results) No results for input(s): BNP in the last 8760 hours.  ProBNP (last 3 results) No results for input(s): PROBNP in the last 8760 hours.  CBG:  Recent Labs Lab 02/27/15 0751 02/27/15 1132 02/27/15 1619 02/27/15 2117 02/28/15 0725  GLUCAP 228* 313* 293* 267* 216*    Recent Results (from the past 240 hour(s))  Culture, blood (routine x 2)     Status: None (Preliminary result)   Collection Time: 02/25/15  2:46 PM  Result Value Ref Range Status   Specimen Description BLOOD RIGHT AC  Final   Special Requests   Final    BOTTLES DRAWN AEROBIC AND ANAEROBIC  AER 6CC ANA  3CC   Culture NO GROWTH 2 DAYS  Final   Report Status PENDING  Incomplete  Culture, blood (routine x 2)     Status: None (Preliminary result)   Collection Time: 02/25/15  2:46 PM  Result Value Ref Range Status   Specimen Description BLOOD RIGHT HAND  Final   Special Requests   Final    BOTTLES DRAWN AEROBIC AND ANAEROBIC  AER 7CC ANA 4CC   Culture NO GROWTH 2 DAYS  Final   Report Status PENDING  Incomplete  Urine culture     Status: None   Collection Time: 02/25/15  2:46 PM  Result Value Ref Range Status   Specimen Description URINE, RANDOM  Final   Special Requests Normal  Final   Culture NO GROWTH 2 DAYS  Final   Report Status 02/27/2015 FINAL  Final     Studies: No results found.  Scheduled  Meds: . carbidopa-levodopa  1 tablet Oral TID  . furosemide  20 mg Oral Daily  . insulin aspart  0-5 Units Subcutaneous QHS  . insulin aspart  0-9 Units Subcutaneous TID WC  . insulin glargine  10 Units Subcutaneous Daily  . lisinopril  40 mg Oral Daily  . metoprolol succinate  50 mg Oral Daily  . Warfarin - Physician Dosing Inpatient   Does not apply q1800    Continuous Infusions:   Assessment/Plan:  1Parkinson's disease/  Bilat  Lower extremity weakness: Unclear etiology On Sinemet  CT Head: diffuse cortical atrophy and small vessel ischemic changes  D/w Dr. Loretha BrasilZeylikman- Hold Coumadin  For LP later today- to r/o Guillain Barre Syndrome Diabetic Neuropathy also a possible diagnosis  2 Type 2 DM/ Hyperglycemia: Hold Metformin-On  SSI 3 A-fib- On Coumadin- Continue Metoprolol 4 HTN; Stable- Continue Lisinopril  5 Diastolic Dysfunction: Continue Lasix 6 Hypokalemia; Replace  7 Physical Therapy- Likely will need Rehab   Code Status: Full  Family Communication: wife       02/28/2015, 8:26 AM

## 2015-02-28 NOTE — Progress Notes (Signed)
Inpatient Diabetes Program Recommendations  AACE/ADA: New Consensus Statement on Inpatient Glycemic Control (2015)  Target Ranges:  Prepandial:   less than 140 mg/dL      Peak postprandial:   less than 180 mg/dL (1-2 hours)      Critically ill patients:  140 - 180 mg/dL  Results for Troy [name], Troy [name] (MRN [id number]) as of 02/28/2015 08:04  Ref. Range 02/27/2015 07:51 02/27/2015 11:32 02/27/2015 16:19 02/27/2015 21:17 02/28/2015 07:25  Glucose-Capillary Latest Ref Range: 65-99 mg/dL 409228 (H) 811313 (H) 914293 (H) 267 (H) 216 (H)   Review of Glycemic Control Diabetes history: DM2 Outpatient Diabetes medications: Humulin R U500 4 units with breakfast and supper, Lantus 40 units QHS, Glipizide 10 mg QAM, Tradjenat 5 mg daily, Metformin XR 500 mg QAM Current orders for Inpatient glycemic control: Lantus 10 units QHS,  Novolog 0-9 units TID with meals, Novolog 0-5 units HS   Inpatient Diabetes Program Recommendations: Insulin - Basal: Please consider increasing Lantus to 15 units daily. Insulin - Meal Coverage: Please consider ordering Novolog 4 units TID with meals for meal coverage (in addition to Novolog correction).  Thanks, Orlando PennerMarie Noella Kipnis, RN, MSN, CCRN, CDE Diabetes Coordinator Inpatient Diabetes Program [phone] (Team Pager from 8am to 5pm) [phone] ([facility] office) [phone] [facility] office) [phone] [facility] office)

## 2015-03-01 DIAGNOSIS — R29898 Other symptoms and signs involving the musculoskeletal system: Secondary | ICD-10-CM | POA: Diagnosis not present

## 2015-03-01 LAB — BASIC METABOLIC PANEL
Anion gap: 9 (ref 5–15)
BUN: 15 mg/dL (ref 6–20)
CHLORIDE: 101 mmol/L (ref 101–111)
CO2: 28 mmol/L (ref 22–32)
CREATININE: 0.77 mg/dL (ref 0.61–1.24)
Calcium: 8.6 mg/dL — ABNORMAL LOW (ref 8.9–10.3)
GFR calc Af Amer: >60 mL/min (ref >60)
GFR calc non Af Amer: >60 mL/min (ref >60)
GLUCOSE: 227 mg/dL — AB (ref 65–99)
POTASSIUM: 3.8 mmol/L (ref 3.5–5.1)
SODIUM: 138 mmol/L (ref 135–145)

## 2015-03-01 LAB — CBC WITH DIFFERENTIAL/PLATELET
BASOS PCT: 1 %
Basophils Absolute: 0.1 10*3/uL (ref 0–0.1)
EOS ABS: 0.3 10*3/uL (ref 0–0.7)
Eosinophils Relative: 4 %
HCT: 37.2 % — ABNORMAL LOW (ref 40.0–52.0)
HEMOGLOBIN: 12.8 g/dL — AB (ref 13.0–18.0)
Lymphocytes Relative: 22 %
Lymphs Abs: 1.7 10*3/uL (ref 1.0–3.6)
MCH: 31.2 pg (ref 26.0–34.0)
MCHC: 34.3 g/dL (ref 32.0–36.0)
MCV: 91 fL (ref 80.0–100.0)
Monocytes Absolute: 0.7 10*3/uL (ref 0.2–1.0)
Monocytes Relative: 9 %
NEUTROS PCT: 64 %
Neutro Abs: 5 10*3/uL (ref 1.4–6.5)
PLATELETS: 210 10*3/uL (ref 150–440)
RBC: 4.09 MIL/uL — AB (ref 4.40–5.90)
RDW: 13.2 % (ref 11.5–14.5)
WBC: 7.7 10*3/uL (ref 3.8–10.6)

## 2015-03-01 LAB — GLUCOSE, CAPILLARY
GLUCOSE-CAPILLARY: 296 mg/dL — AB (ref 65–99)
Glucose-Capillary: 232 mg/dL — ABNORMAL HIGH (ref 65–99)

## 2015-03-01 LAB — PROTIME-INR
INR: 1.31
PROTHROMBIN TIME: 16.5 s — AB (ref 11.4–15.0)

## 2015-03-01 NOTE — Care Management Note (Signed)
Case Management Note  Patient Details  Name: Troy [name] MRN: [id number] Date of Birth: [birth year]  Subjective/Objective:  2 RNCM, unit director and charge nurse met with patient and his wife with caregiver at bedside. PT now recommending SNF based on patients evaluation today. Baxter Flattery, CSW updated to to evaluate patient for SNF.                 Action/Plan:   Expected Discharge Date:                  Expected Discharge Plan:  [place]  In-House Referral:     Discharge planning Services  CM Consult  Post Acute Care Choice:    Choice offered to:  Spouse  DME Arranged:  N/A DME Agency:     HH Arranged:    [place] Agency:   (Patient was open to [place] prior to admission)  Status of Service:  In process, will continue to follow  Medicare Important Message Given:    Date Medicare IM Given:    Medicare IM give by:    Date Additional Medicare IM Given:    Additional Medicare Important Message give by:     If discussed at [place] of Stay Meetings, dates discussed:    Additional Comments:  Jolly Mango, RN 03/01/2015, 10:53 AM

## 2015-03-01 NOTE — Progress Notes (Signed)
CSW was notified that MD has medically cleared Pt for dc to SNF [facility]. CSW sent dc packet and summary to facility via epic. RN to call report and EMS for transfer. Pt's wife at bedside while Pt resting.  No further CSW needs at this time.   Wilford Gristara Kensey Luepke, LCSW [phone]

## 2015-03-01 NOTE — Progress Notes (Signed)
Checked to see if pre-authorization would be needed for non-emergent EMS transport.  Per [employer]’s automated system, [phone], patient has a [employer] Medicare Advantage PPO policy.  [employer] Medicare PPO plans do not require pre-auth for non-emergent ground transports using service codes A0426 or A0428.   °

## 2015-03-01 NOTE — Care Management Note (Signed)
Case Management Note  Patient Details  Name: Troy BurnRobert [name] MRN: [id number] Date of Birth: 07/08/1923  Subjective/Objective:  PT in to re evaluate patient at this time                  Action/Plan:   Expected Discharge Date:                  Expected Discharge Plan:  Home w Home Health Services  In-House Referral:     Discharge planning Services  CM Consult  Post Acute Care Choice:    Choice offered to:  Spouse  DME Arranged:  N/A DME Agency:     HH Arranged:    HH Agency:   (Patient was open to [facility] prior to admission)  Status of Service:  In process, will continue to follow  Medicare Important Message Given:    Date Medicare IM Given:    Medicare IM give by:    Date Additional Medicare IM Given:    Additional Medicare Important Message give by:     If discussed at Long [place] of Stay Meetings, dates discussed:    Additional Comments:  Marily MemosLisa M Jasslyn Finkel, RN 03/01/2015, 10:21 AM

## 2015-03-01 NOTE — Discharge Summary (Addendum)
Physician Discharge Summary  Troy [name] HBZ:[id number] DOB: December 22, 1923 DOA: 02/25/2015  PCP: Tracie Harrier, MD  Admit date: 02/25/2015 Discharge date: 03/01/2015  Time spent: 35 minutes  Recommendations for Outpatient Follow-up:  1 Discharge to Skilled Nursing .   Discharge Diagnoses:   1 Bilateral  Lower extremity weakness with difficulty in ambulation 2 Parkinson's disease 3 Type 2 DM/ Peripheral Neuropathy 4 Chronic A-fib- On anticoagulant therapy 5 History of CHF   Discharge Condition: Stable  Diet recommendation: [place] [employer]ights   02/27/15 0527 02/28/15 0538 03/01/15 0500  Weight: 101.923 kg (224 lb 11.2 oz) 101.016 kg (222 lb 11.2 oz) 101.334 kg (223 lb 6.4 oz)    History of present illness:  Troy [name] is a 81 year old with a history of Parkinson's disease, Type 2 Diabetes, hypertension, chronic atrial fibrillation on Coumadin, recent UTI , Presenting the ED with progressive weakness of both lower extremities . According to wife he's been wanting  to sleep all the time. Patient also has been sleeping a lot. Patient denies any fevers or chills   Hospital Course:  Patient was admitted [facility] .CT scan of the chest did not identify an acute upper maladies a CT of the abdomen and pelvis showed multiple renal cysts evidence of diverticular change without diverticulitis CT head  showed moderate cortical atrophy and chronic white matter changes but no acute abnormality . Patient was seen in consultation by Dr. Irish Elders- Neurologist-who did recommend a lumbar puncture initially to rule out Guillain-Barr syndrome. Blood and urine cultures were also negative Patient's Metformin was DC'd-since family felt that this was contributing to loose stools However his leg weakness gradually started improving is able to ablate reasonable extent. He was later decided that that suspicion for GBS was low. He was he was seen by physical  therapy and discharged stable condition to skilled nursing facility    Consultations: Dr. Irish Elders- Neurologist  Discharge Exam: Filed Vitals:   03/01/15 1335  BP: 140/73  Pulse: 64  Temp:   Resp: 16    General: Not in dsitress Cardiovascular: S1 S2  Respiratory: Clear to auscultation  Discharge Instructions Check CBC,Met-c and Pt-INR in 2 days  Discharge Instructions    Diet - low sodium heart healthy    Complete by:  As directed      Discharge instructions    Complete by:  As directed   D/c Home today. Home Health Nursing and PT  Call office to make appt in 1-2 weeks          Current Discharge Medication List    CONTINUE these medications which have NOT CHANGED   Details  carbidopa-levodopa (PARCOPA) 25-250 MG disintegrating tablet Take 1 tablet by mouth 3 (three) times daily.    furosemide (LASIX) 20 MG tablet Take 20 mg by mouth daily.    glipiZIDE (GLUCOTROL XL) 10 MG 24 hr tablet Take 10 mg by mouth daily with breakfast.    !! insulin regular human CONCENTRATED (HUMULIN R) 500 UNIT/ML injection Inject 4 Units into the skin daily with breakfast.    !! insulin regular human CONCENTRATED (HUMULIN R) 500 UNIT/ML injection Inject 6 Units into the skin daily with supper.    linagliptin (TRADJENTA) 5 MG TABS tablet Take 5 mg by mouth daily.    lisinopril (PRINIVIL,ZESTRIL) 40 MG tablet Take 40 mg by mouth daily.    metoprolol succinate (TOPROL-XL) 50 MG 24 hr tablet Take 50 mg by mouth daily. Take with or immediately following a  meal.    !! warfarin (COUMADIN) 5 MG tablet Take 5 mg by mouth 2 (two) times a week. Take on Saturday and Sunday.  *note dose*    !! warfarin (COUMADIN) 6 MG tablet Take 6 mg by mouth daily. Take on Monday thru Friday only. *note dose*     !! - Potential duplicate medications found. Please discuss with provider.    STOP taking these medications     cephALEXin (KEFLEX) 500 MG capsule      metFORMIN (GLUCOPHAGE-XR) 500 MG 24 hr  tablet        No Known Allergies    The results of significant diagnostics from this hospitalization (including imaging, microbiology, ancillary and laboratory) are listed below for reference.    Significant Diagnostic Studies: Dg Chest 1 View  02/25/2015  CLINICAL DATA:  81 year old male with altered mental status. EXAM: CHEST 1 VIEW COMPARISON:  02/21/2015 and prior exams FINDINGS: Moderate to marked cardiomegaly and mitral valve replacement again noted. Pulmonary vascular congestion is present. There is no evidence of focal airspace disease, pulmonary edema, suspicious pulmonary nodule/mass, pleural effusion, or pneumothorax. No acute bony abnormalities are identified. Remote left-sided rib fractures are present. IMPRESSION: Cardiomegaly with pulmonary vascular congestion. Electronically Signed   By: Margarette Canada M.D.   On: 02/25/2015 16:46   Dg Chest 1 View  02/21/2015  CLINICAL DATA:  Generalized weakness. Increased secretions and crackles in the lungs. EXAM: CHEST 1 VIEW COMPARISON:  Chest CT 06/05/2014 and radiograph 03/16/2014 FINDINGS: The cardiac silhouette remains moderately enlarged. Sequelae of prior mitral valve repair are again identified. There is pulmonary vascular congestion with mild interstitial densities bilaterally, less than on the prior radiograph. No pleural effusion or pneumothorax is identified. No acute osseous abnormality is identified. IMPRESSION: Cardiomegaly with mild interstitial edema. Electronically Signed   By: Logan Bores M.D.   On: 02/21/2015 11:28   Ct Head Wo Contrast  02/25/2015  CLINICAL DATA:  Altered mental status. EXAM: CT HEAD WITHOUT CONTRAST TECHNIQUE: Contiguous axial images were obtained from the base of the skull through the vertex without intravenous contrast. COMPARISON:  CT scan of February 22, 2015. FINDINGS: Fluid is again noted in the right mastoid air cells. Bony calvarium is otherwise intact. Moderate diffuse cortical atrophy is noted.  Mild chronic ischemic white matter disease is noted. Old lacunar infarction is noted in right basal ganglia. No mass effect or midline shift is noted. Ventricular size is within normal limits. There is no evidence of mass lesion, hemorrhage or acute infarction. IMPRESSION: Moderate diffuse cortical atrophy. Mild chronic ischemic white matter disease. No acute intracranial abnormality seen. Electronically Signed   By: Marijo Conception, M.D.   On: 02/25/2015 17:44   Ct Head Wo Contrast  02/22/2015  CLINICAL DATA:  Increasing weakness since Sunday. EXAM: CT HEAD WITHOUT CONTRAST TECHNIQUE: Contiguous axial images were obtained from the base of the skull through the vertex without intravenous contrast. COMPARISON:  Head CT 10/21/2014 FINDINGS: Stable age related cerebral atrophy, ventriculomegaly and periventricular white matter disease. No extra-axial fluid collections are identified. No CT findings for acute hemispheric infarction or intracranial hemorrhage. No mass lesions. The brainstem and cerebellum are normal. The bony structures are intact. No acute fracture. Chronic nasal polyposis. The paranasal sinuses are clear. There is a right mastoid effusion noted. IMPRESSION: 1. Stable age related cerebral atrophy, ventriculomegaly periventricular white matter disease. No acute intracranial findings or mass lesions. 2. Chronic nasal polyps. 3. Right mastoid effusion. Electronically Signed   By: Mamie Nick.  Gallerani M.D.   On: 02/22/2015 15:16   Ct Chest W Contrast  02/25/2015  CLINICAL DATA:  Generalized weakness and decreased appetite with cough EXAM: CT CHEST, ABDOMEN, AND PELVIS WITH CONTRAST TECHNIQUE: Multidetector CT imaging of the chest, abdomen and pelvis was performed following the standard protocol during bolus administration of intravenous contrast. CONTRAST:  194m OMNIPAQUE IOHEXOL 300 MG/ML  SOLN COMPARISON:  06/05/2014 FINDINGS: CT CHEST FINDINGS Mediastinum/Nodes: The thoracic aorta is within normal  limits without evidence of aneurysmal dilatation or dissection. No mediastinal hematoma is seen. The origins of the brachiocephalic vessels are within normal limits. Pulmonary artery shows no definitive pulmonary emboli although timing was not performed for embolus evaluation. No significant hilar or mediastinal adenopathy is noted. Mild coronary calcifications are seen. Lungs/Pleura: The lungs are well aerated bilaterally. No focal infiltrate or sizable effusion is noted. No sizable pneumothorax is noted. Musculoskeletal: Healing rib fractures are noted on the left degenerative changes of thoracic spine are noted. CT ABDOMEN PELVIS FINDINGS Hepatobiliary: Gallbladder has been surgically removed. The liver is within normal limits. Pancreas: Mildly atrophic.  No focal mass lesion is seen. Spleen: Within normal limits. Adrenals/Urinary Tract: The adrenal glands are unremarkable. The kidneys demonstrate hypodensities bilaterally consistent with cysts. No renal calculi or obstructive changes are noted. The largest of the cysts on the left measures 7.9 cm. The largest on the right measures approximately 6 cm. Bladder is well distended. Stomach/Bowel: Scattered diverticular changes noted without evidence of diverticulitis. The appendix is within normal limits. Vascular/Lymphatic: Aortoiliac calcifications are seen without aneurysmal dilatation. No significant lymphadenopathy is identified. Reproductive: Prostatic calcifications are noted. Other: A stimulation device is noted in the left hemipelvis. Musculoskeletal: Degenerative changes of the lumbar spine are seen. IMPRESSION: CT of the chest:  No acute abnormality identified. CT of the abdomen and pelvis:  Multiple renal cysts. Diverticular change without diverticulitis No acute abnormality is noted. Electronically Signed   By: MInez CatalinaM.D.   On: 02/25/2015 17:51   Ct Abdomen Pelvis W Contrast  02/25/2015  CLINICAL DATA:  Generalized weakness and decreased  appetite with cough EXAM: CT CHEST, ABDOMEN, AND PELVIS WITH CONTRAST TECHNIQUE: Multidetector CT imaging of the chest, abdomen and pelvis was performed following the standard protocol during bolus administration of intravenous contrast. CONTRAST:  1239mOMNIPAQUE IOHEXOL 300 MG/ML  SOLN COMPARISON:  06/05/2014 FINDINGS: CT CHEST FINDINGS Mediastinum/Nodes: The thoracic aorta is within normal limits without evidence of aneurysmal dilatation or dissection. No mediastinal hematoma is seen. The origins of the brachiocephalic vessels are within normal limits. Pulmonary artery shows no definitive pulmonary emboli although timing was not performed for embolus evaluation. No significant hilar or mediastinal adenopathy is noted. Mild coronary calcifications are seen. Lungs/Pleura: The lungs are well aerated bilaterally. No focal infiltrate or sizable effusion is noted. No sizable pneumothorax is noted. Musculoskeletal: Healing rib fractures are noted on the left degenerative changes of thoracic spine are noted. CT ABDOMEN PELVIS FINDINGS Hepatobiliary: Gallbladder has been surgically removed. The liver is within normal limits. Pancreas: Mildly atrophic.  No focal mass lesion is seen. Spleen: Within normal limits. Adrenals/Urinary Tract: The adrenal glands are unremarkable. The kidneys demonstrate hypodensities bilaterally consistent with cysts. No renal calculi or obstructive changes are noted. The largest of the cysts on the left measures 7.9 cm. The largest on the right measures approximately 6 cm. Bladder is well distended. Stomach/Bowel: Scattered diverticular changes noted without evidence of diverticulitis. The appendix is within normal limits. Vascular/Lymphatic: Aortoiliac calcifications are seen without aneurysmal dilatation.  No significant lymphadenopathy is identified. Reproductive: Prostatic calcifications are noted. Other: A stimulation device is noted in the left hemipelvis. Musculoskeletal: Degenerative  changes of the lumbar spine are seen. IMPRESSION: CT of the chest:  No acute abnormality identified. CT of the abdomen and pelvis:  Multiple renal cysts. Diverticular change without diverticulitis No acute abnormality is noted. Electronically Signed   By: Inez Catalina M.D.   On: 02/25/2015 17:51    Microbiology: Recent Results (from the past 240 hour(s))  Culture, blood (routine x 2)     Status: None (Preliminary result)   Collection Time: 02/25/15  2:46 PM  Result Value Ref Range Status   Specimen Description BLOOD RIGHT [facility]  Final   Special Requests   Final    BOTTLES DRAWN AEROBIC AND ANAEROBIC  AER 6CC ANA 3CC   Culture NO GROWTH 2 DAYS  Final   Report Status PENDING  Incomplete  Culture, blood (routine x 2)     Status: None (Preliminary result)   Collection Time: 02/25/15  2:46 PM  Result Value Ref Range Status   Specimen Description BLOOD RIGHT HAND  Final   Special Requests   Final    BOTTLES DRAWN AEROBIC AND ANAEROBIC  AER [place] ANA 4CC   Culture NO GROWTH 2 DAYS  Final   Report Status PENDING  Incomplete  Urine culture     Status: None   Collection Time: 02/25/15  2:46 PM  Result Value Ref Range Status   Specimen Description URINE, RANDOM  Final   Special Requests Normal  Final   Culture NO GROWTH 2 DAYS  Final   Report Status 02/27/2015 FINAL  Final     Labs: Basic Metabolic Panel:  Recent Labs Lab 02/25/15 1446 02/26/15 0453 02/27/15 0701 02/28/15 0445 03/01/15 0505  NA 137 139 137 138 138  K 3.4* 3.2* 3.5 3.3* 3.8  CL 98* 103 100* 101 101  CO2 28 30 30 31 28   GLUCOSE 427* 196* 238* 217* 227*  BUN 17 12 14 12 15   CREATININE 1.13 0.85 0.82 0.83 0.77  CALCIUM 8.5* 8.0* 8.2* 8.4* 8.6*   Liver Function Tests:  Recent Labs Lab 02/25/15 1446  AST 23  ALT 6*  ALKPHOS 69  BILITOT 1.5*  PROT 6.9  ALBUMIN 3.0*   No results for input(s): LIPASE, AMYLASE in the last 168 hours. No results for input(s): AMMONIA in the last 168 hours. CBC:  Recent Labs Lab  02/25/15 1446 02/26/15 0453 02/27/15 0701 02/28/15 0445 03/01/15 0505  WBC 8.8 7.1 6.4 5.9 7.7  NEUTROABS  --   --  4.1 3.8 5.0  HGB 12.9* 12.6* 12.0* 11.9* 12.8*  HCT 39.4* 37.4* 35.7* 35.7* 37.2*  MCV 91.1 91.8 89.9 90.3 91.0  PLT 192 179 179 203 210   Cardiac Enzymes: No results for input(s): CKTOTAL, CKMB, CKMBINDEX, TROPONINI in the last 168 hours. BNP: BNP (last 3 results) No results for input(s): BNP in the last 8760 hours.  ProBNP (last 3 results) No results for input(s): PROBNP in the last 8760 hours.  CBG:  Recent Labs Lab 02/28/15 1134 02/28/15 1631 02/28/15 2227 03/01/15 0746 03/01/15 1148  GLUCAP 293* 284* 259* 232* 296*       Signed:  Nevaeh Korte   03/01/2015, 2:20 PM

## 2015-03-01 NOTE — Care Management Note (Signed)
Case Management Note  Patient Details  Name: Troy [name] MRN: [id number] Date of Birth: [birth year]  Subjective/Objective:  Met with wife at bedside. Re interated what CM and PT stated yesterday and PT recommendations. Wife extremely upset and wants to speak with supervisor. Meeting scheduled for 1030 with Marshell Garfinkel, RNCM team lead, Baxter Flattery, [place] and Therapist, sports.                 Action/Plan:   Expected Discharge Date:                  Expected Discharge Plan:  [place]  In-House Referral:     Discharge planning Services  CM Consult  Post Acute Care Choice:    Choice offered to:  Spouse  DME Arranged:  N/A DME Agency:     HH Arranged:    [place] Agency:   (Patient was open to [place] prior to admission)  Status of Service:  In process, will continue to follow  Medicare Important Message Given:    Date Medicare IM Given:    Medicare IM give by:    Date Additional Medicare IM Given:    Additional Medicare Important Message give by:     If discussed at [place] of Stay Meetings, dates discussed:    Additional Comments:  Jolly Mango, RN 03/01/2015, 10:06 AM

## 2015-03-01 NOTE — Clinical Social Work Placement (Signed)
   CLINICAL SOCIAL WORK PLACEMENT  NOTE  Date:  03/01/2015  Patient Details  Name: Troy BurnRobert [name] MRN: [id number] Date of Birth: Sep 02, 1923  Clinical Social Work is seeking post-discharge placement for this patient at the   level of care (*CSW will initial, date and re-position this form in  chart as items are completed):  Yes   Patient/family provided with [facility] Clinical Social Work Department's list of facilities offering this level of care within the geographic area requested by the patient (or if unable, by the patient's family).  Yes   Patient/family informed of their freedom to choose among providers that offer the needed level of care, that participate in Medicare, Medicaid or managed care program needed by the patient, have an available bed and are willing to accept the patient.  Yes   Patient/family informed of [facility] ownership interest [facility] and [facility], as well as of the fact that they are under no obligation to receive care at these facilities.  PASRR submitted to EDS on       PASRR number received on       Existing PASRR number confirmed on 03/01/15     FL2 transmitted to all facilities in geographic area requested by pt/family on 03/01/15     FL2 transmitted to all facilities within larger geographic area on       Patient informed that his/her managed care company has contracts with or will negotiate with certain facilities, including the following:        Yes   Patient/family informed of bed offers received.  Patient chooses bed at  [facility]([place])     Physician recommends and patient chooses bed at      Patient to be transferred to  [facility]([place]) on  .  Patient to be transferred to facility by EMS     Patient family notified on 03/01/15 of transfer.  Name of family member notified:  wife at bedside     PHYSICIAN       Additional Comment:    _______________________________________________ Ned Cardara N Tripp Goins,  LCSW 03/01/2015, 4:39 PM

## 2015-03-01 NOTE — Progress Notes (Signed)
Inpatient Diabetes Program Recommendations  AACE/ADA: New Consensus Statement on Inpatient Glycemic Control (2015)  Target Ranges:  Prepandial:   less than 140 mg/dL      Peak postprandial:   less than 180 mg/dL (1-2 hours)      Critically ill patients:  140 - 180 mg/dL  Results for Troy [name], Troy [name] (MRN [id number]) as of 03/01/2015 07:40  Ref. Range 03/01/2015 05:05  Glucose Latest Ref Range: 65-99 mg/dL 409227 (H)   Results for Troy [name], Troy [name] (MRN [id number]) as of 03/01/2015 07:40  Ref. Range 02/28/2015 07:25 02/28/2015 11:34 02/28/2015 16:31 02/28/2015 22:27  Glucose-Capillary Latest Ref Range: 65-99 mg/dL 956216 (H) 213293 (H) 086284 (H) 259 (H)   Review of Glycemic Control  Diabetes history: DM2 Outpatient Diabetes medications: Humulin R U500 4 units with breakfast and supper, Lantus 40 units QHS, Glipizide 10 mg QAM, Tradjenat 5 mg daily, Metformin XR 500 mg QAM Current orders for Inpatient glycemic control: Lantus 10 units QHS, Novolog 0-9 units TID with meals, Novolog 0-5 units HS   Inpatient Diabetes Program Recommendations: Insulin - Basal: Glucose ranged from 216-293 mg/dl on 57/84/6910/26/16 and fasting lab glucose is 227 mg/dl this morning. Please consider increasing Lantus to 15 units daily. Insulin - Meal Coverage: Post prandial glucose is consistently elevated. Please consider ordering Novolog 4 units TID with meals for meal coverage (in addition to Novolog correction).  Thanks, Orlando PennerMarie Makana Feigel, RN, MSN, CCRN, CDE Diabetes Coordinator Inpatient Diabetes Program [phone] (Team Pager from 8am to 5pm) [phone] ([facility] office) [phone] [facility] office) [phone] [facility] office)

## 2015-03-01 NOTE — Progress Notes (Signed)
PROGRESS NOTE  Troy BurnRobert [name] [id number] DOB: March 21, 1924 DOA: 02/25/2015 PCP: Barbette ReichmannHANDE,Troy Vanhorn, MD  Subjective  81 y/o male with hx of Parkinson's disease, Type 2 DM, HTN, Chronic A-fib CHF admitted with bilat lower extremity weakness. Recently was seen in ER with similar symptoms and treated with Keflexfro UTI This am drowsy  Consultants: Neurology   Objective: BP 166/79 mmHg  Pulse 70  Temp(Src) 97.7 F (36.5 C) (Oral)  Resp 20  Ht 5' 9.5" (1.765 m)  Wt 101.334 kg (223 lb 6.4 oz)  BMI 32.53 kg/m2  SpO2 96%  Intake/Output Summary (Last 24 hours) at 03/01/15 0818 Last data filed at 03/01/15 0530  Gross per 24 hour  Intake    440 ml  Output      0 ml  Net    440 ml   Filed Weights   02/27/15 0527 02/28/15 0538 03/01/15 0500  Weight: 101.923 kg (224 lb 11.2 oz) 101.016 kg (222 lb 11.2 oz) 101.334 kg (223 lb 6.4 oz)    Exam:  General: Elderly male, in NAD HEENT: PERRL; OP moist without lesions. Neck: supple, trachea midline, no thyromegaly Chest: normal to palpation Lungs: clear bilaterally without retractions or wheezes Cardiovascular: RRR, no murmur, no gallop; distal pulses 2+ Abdomen: soft, nontender, nondistended, positive bowel sounds Extremities: no clubbing, cyanosis, edema Neuro: alert and oriented, moves all extremities.  Bilat Leg weakness  Noted. No sensory loss Derm: no significant rashes or nodules; good skin turgor Lymph: no cervical or supraclavicular lymphadenopathy   Labs and imaging studies were reviewed*  Data Reviewed: Basic Metabolic Panel:  Recent Labs Lab 02/25/15 1446 02/26/15 0453 02/27/15 0701 02/28/15 0445 03/01/15 0505  NA 137 139 137 138 138  K 3.4* 3.2* 3.5 3.3* 3.8  CL 98* 103 100* 101 101  CO2 28 30 30 31 28   GLUCOSE 427* 196* 238* 217* 227*  BUN 17 12 14 12 15   CREATININE 1.13 0.85 0.82 0.83 0.77  CALCIUM 8.5* 8.0* 8.2* 8.4* 8.6*   Liver Function Tests:  Recent Labs Lab 02/25/15 1446  AST 23  ALT 6*   ALKPHOS 69  BILITOT 1.5*  PROT 6.9  ALBUMIN 3.0*   No results for input(s): LIPASE, AMYLASE in the last 168 hours. No results for input(s): AMMONIA in the last 168 hours. CBC:  Recent Labs Lab 02/25/15 1446 02/26/15 0453 02/27/15 0701 02/28/15 0445 03/01/15 0505  WBC 8.8 7.1 6.4 5.9 7.7  NEUTROABS  --   --  4.1 3.8 5.0  HGB 12.9* 12.6* 12.0* 11.9* 12.8*  HCT 39.4* 37.4* 35.7* 35.7* 37.2*  MCV 91.1 91.8 89.9 90.3 91.0  PLT 192 179 179 203 210   Cardiac Enzymes:   No results for input(s): CKTOTAL, CKMB, CKMBINDEX, TROPONINI in the last 168 hours. BNP (last 3 results) No results for input(s): BNP in the last 8760 hours.  ProBNP (last 3 results) No results for input(s): PROBNP in the last 8760 hours.  CBG:  Recent Labs Lab 02/28/15 0725 02/28/15 1134 02/28/15 1631 02/28/15 2227 03/01/15 0746  GLUCAP 216* 293* 284* 259* 232*    Recent Results (from the past 240 hour(s))  Culture, blood (routine x 2)     Status: None (Preliminary result)   Collection Time: 02/25/15  2:46 PM  Result Value Ref Range Status   Specimen Description BLOOD RIGHT AC  Final   Special Requests   Final    BOTTLES DRAWN AEROBIC AND ANAEROBIC  AER 6CC ANA 3CC   Culture NO GROWTH 2  DAYS  Final   Report Status PENDING  Incomplete  Culture, blood (routine x 2)     Status: None (Preliminary result)   Collection Time: 02/25/15  2:46 PM  Result Value Ref Range Status   Specimen Description BLOOD RIGHT HAND  Final   Special Requests   Final    BOTTLES DRAWN AEROBIC AND ANAEROBIC  AER 7CC ANA 4CC   Culture NO GROWTH 2 DAYS  Final   Report Status PENDING  Incomplete  Urine culture     Status: None   Collection Time: 02/25/15  2:46 PM  Result Value Ref Range Status   Specimen Description URINE, RANDOM  Final   Special Requests Normal  Final   Culture NO GROWTH 2 DAYS  Final   Report Status 02/27/2015 FINAL  Final     Studies: No results found.  Scheduled Meds: . carbidopa-levodopa  1  tablet Oral TID  . furosemide  20 mg Oral Daily  . insulin aspart  0-5 Units Subcutaneous QHS  . insulin aspart  0-9 Units Subcutaneous TID WC  . insulin glargine  10 Units Subcutaneous Daily  . lisinopril  40 mg Oral Daily  . metoprolol succinate  50 mg Oral Daily  . potassium chloride  20 mEq Oral Daily  . warfarin  6 mg Oral Once per day on Mon Tue Wed Thu Fri   And  . [START ON 03/03/2015] warfarin  5 mg Oral Once per day on Sun Sat  . Warfarin - Physician Dosing Inpatient   Does not apply q1800    Continuous Infusions:   Assessment/Plan:  1Parkinson's disease/  Bilat  Lower extremity weakness: Unclear etiology On Sinemet  CT Head: diffuse cortical atrophy and small vessel ischemic changes  Neurologist Decided against doing LP Diabetic Neuropathy also a possible diagnosis  2 Type 2 DM/ Hyperglycemia: On  SSI 3 A-fib-Restarted Coumadin- Continue Metoprolol 4 HTN; Stable- Continue Lisinopril  5 Diastolic Dysfunction: Continue Lasix 6 Hypokalemia; Improved 7 Weakness-Seen by PT- Pt did not qualify for Rehab. Home with home Health   Code Status: Full  Family Communication: wife       03/01/2015, 8:18 AM

## 2015-03-01 NOTE — NC FL2 (Signed)
[place] MEDICAID FL2 LEVEL OF CARE SCREENING TOOL     IDENTIFICATION  Patient Name: Troy [name] Birthdate: April 03, 1924 Sex: male Admission Date (Current Location): 02/25/2015  [facility] and [place] Number: Administrator and Address:  [facility], [address] [place], [place] [address]      Provider Number: [id number]  Attending Physician Name and Address:  Barbette Reichmann, MD  Relative Name and Phone Number:       Current Level of Care: SNF Recommended Level of Care: Skilled Nursing Facility Prior Approval Number:    Date Approved/Denied:   PASRR Number:    Discharge Plan: SNF    Current Diagnoses: Patient Active Problem List   Diagnosis Date Noted  . Lower extremity weakness 02/25/2015   Past Medical History  Diagnosis Date  . Diabetes mellitus without complication (HCC)   . Hypertension   . UTI (lower urinary tract infection)   . Parkinson's disease (HCC)   . TIA (transient ischemic attack)   . Atrial fibrillation (HCC)   . CHF (congestive heart failure) (HCC)   . Stroke (HCC)   . Hypothyroidism          Orientation ACTIVITIES/SOCIAL BLADDER RESPIRATION    Self, Situation, Place  Family supportive Incontinent O2 (As needed)  BEHAVIORAL SYMPTOMS/MOOD NEUROLOGICAL BOWEL NUTRITION STATUS      Incontinent Diet  PHYSICIAN VISITS COMMUNICATION OF NEEDS Height & Weight Skin    Verbally   223 lbs. Normal          AMBULATORY STATUS RESPIRATION    Assist extensive O2 (As needed)      Personal Care Assistance Level of Assistance  Bathing, Dressing Bathing Assistance: Limited assistance   Dressing Assistance: Limited assistance      Functional Limitations Info      Hearing Info: Adequate         SPECIAL CARE FACTORS FREQUENCY  PT (By licensed PT)     PT Frequency: 5             Additional Factors Info                  Current Medications  (03/01/2015): Current Facility-Administered Medications  Medication Dose Route Frequency Provider Last Rate Last Dose  . acetaminophen (TYLENOL) tablet 650 mg  650 mg Oral Q6H PRN Gale Journey, MD       Or  . acetaminophen (TYLENOL) suppository 650 mg  650 mg Rectal Q6H PRN Gale Journey, MD      . carbidopa-levodopa (SINEMET IR) 25-250 MG per tablet immediate release 1 tablet  1 tablet Oral TID Gale Journey, MD   1 tablet at 03/01/15 0831  . furosemide (LASIX) tablet 20 mg  20 mg Oral Daily Gale Journey, MD   20 mg at 03/01/15 0831  . insulin aspart (novoLOG) injection 0-5 Units  0-5 Units Subcutaneous QHS Gale Journey, MD   3 Units at 02/28/15 2239  . insulin aspart (novoLOG) injection 0-9 Units  0-9 Units Subcutaneous TID WC Gale Journey, MD   3 Units at 03/01/15 0831  . insulin glargine (LANTUS) injection 10 Units  10 Units Subcutaneous Daily Barbette Reichmann, MD   10 Units at 02/28/15 2240  . lisinopril (PRINIVIL,ZESTRIL) tablet 40 mg  40 mg Oral Daily Gale Journey, MD   40 mg at 03/01/15 0831  . metoprolol succinate (TOPROL-XL) 24 hr tablet 50 mg  50 mg Oral Daily Gale Journey, MD  50 mg at 03/01/15 0831  . ondansetron (ZOFRAN) tablet 4 mg  4 mg Oral Q6H PRN Gale Journeyatherine P Walsh, MD       Or  . ondansetron [facility](ZOFRAN) injection 4 mg  4 mg Intravenous Q6H PRN Gale Journeyatherine P Walsh, MD      . polyethylene glycol (MIRALAX / GLYCOLAX) packet 17 g  17 g Oral Daily PRN Gale Journeyatherine P Walsh, MD      . potassium chloride SA (K-DUR,KLOR-CON) CR tablet 20 mEq  20 mEq Oral Daily Vishwanath Hande, MD   20 mEq at 03/01/15 0831  . warfarin (COUMADIN) tablet 6 mg  6 mg Oral Once per day on Mon Tue Wed Thu Fri Barbette ReichmannVishwanath Hande, MD   6 mg at 02/28/15 [id number]   And  . [START ON 03/03/2015] warfarin (COUMADIN) tablet 5 mg  5 mg Oral Once per day on Sun Sat Barbette ReichmannVishwanath Hande, MD      . Warfarin - Physician Dosing Inpatient   Does not apply [id number] Barbette ReichmannVishwanath Hande, MD       Do not use  this list as official medication orders. Please verify with discharge summary.  Discharge Medications:   Medication List    STOP taking these medications        cephALEXin 500 MG capsule  Commonly known as:  KEFLEX     metFORMIN 500 MG 24 hr tablet  Commonly known as:  GLUCOPHAGE-XR      TAKE these medications        carbidopa-levodopa 25-250 MG disintegrating tablet  Commonly known as:  PARCOPA  Take 1 tablet by mouth 3 (three) times daily.     furosemide 20 MG tablet  Commonly known as:  LASIX  Take 20 mg by mouth daily.     glipiZIDE 10 MG 24 hr tablet  Commonly known as:  GLUCOTROL XL  Take 10 mg by mouth daily with breakfast.     insulin regular human CONCENTRATED 500 UNIT/ML injection  Commonly known as:  HUMULIN R  Inject 4 Units into the skin daily with breakfast.     insulin regular human CONCENTRATED 500 UNIT/ML injection  Commonly known as:  HUMULIN R  Inject 6 Units into the skin daily with supper.     lisinopril 40 MG tablet  Commonly known as:  PRINIVIL,ZESTRIL  Take 40 mg by mouth daily.     metoprolol succinate 50 MG 24 hr tablet  Commonly known as:  TOPROL-XL  Take 50 mg by mouth daily. Take with or immediately following a meal.     TRADJENTA 5 MG Tabs tablet  Generic drug:  linagliptin  Take 5 mg by mouth daily.     warfarin 6 MG tablet  Commonly known as:  COUMADIN  Take 6 mg by mouth daily. Take on Monday thru Friday only. *note dose*     warfarin 5 MG tablet  Commonly known as:  COUMADIN  Take 5 mg by mouth 2 (two) times a week. Take on Saturday and Sunday.  *note dose*        Relevant Imaging Results:  Relevant Lab Results:  Recent Labs    Additional Information    Ned Cardara N Nevaen Tredway, LCSW

## 2015-03-01 NOTE — Care Management (Signed)
This RNCM was called to this room by partner RNCM Misty StanleyLisa to meet with patient's wife and caregiver concerned with patient returning home. [facility] is [facility]. Wife states he has good days and bad days. He did well with PT until today where he had limited mobility. Patient agrees to SNF and states that he "will be able to participate with PT at a SNF". His wife states she is prepared for long-term care as I mentioned that to her but patient wants to remain in the home ultimately. He has been to [facility] and [facility] around 11 months ago. He has chronic O2 [facility]. He has a caregiver he pays for privately Monday through Friday from 8a-4p. Today when he worked with PT his O2 sats (on O2) remained in the low 90's but his HR was ~125 bpm. Delice Bisonara CSW updated.

## 2015-03-01 NOTE — Consult Note (Signed)
CC: b/l LE weakness  Doing much better   Able to ambulate with PT for past 2 days.    Past Medical History  Diagnosis Date  . Diabetes mellitus without complication (HCC)   . Hypertension   . UTI (lower urinary tract infection)   . Parkinson's disease (HCC)   . TIA (transient ischemic attack)   . Atrial fibrillation (HCC)   . CHF (congestive heart failure) (HCC)   . Stroke (HCC)   . Hypothyroidism     Past Surgical History  Procedure Laterality Date  . Cardiac surgery    . Cholecystectomy      Family History  Problem Relation Age of Onset  . Hypertension Mother   . Stroke Mother   . CAD Father   . Lymphoma Sister   . Lung disease Brother     Social History:  reports that he has quit smoking. He has never used smokeless tobacco. He reports that he does not drink alcohol or use illicit drugs.  No Known Allergies  Medications: I have reviewed the patient's current medications.  ROS: History obtained from the patient  General ROS: negative for - chills, fatigue, fever, night sweats, weight gain or weight loss Psychological ROS: negative for - behavioral disorder, hallucinations, memory difficulties, mood swings or suicidal ideation Ophthalmic ROS: negative for - blurry vision, double vision, eye pain or loss of vision ENT ROS: negative for - epistaxis, nasal discharge, oral lesions, sore throat, tinnitus or vertigo Allergy and Immunology ROS: negative for - hives or itchy/watery eyes Hematological and Lymphatic ROS: negative for - bleeding problems, bruising or swollen lymph nodes Endocrine ROS: negative for - galactorrhea, hair pattern changes, polydipsia/polyuria or temperature intolerance Respiratory ROS: negative for - cough, hemoptysis, shortness of breath or wheezing Cardiovascular ROS: negative for - chest pain, dyspnea on exertion, edema or irregular heartbeat Gastrointestinal ROS: negative for - abdominal pain, diarrhea, hematemesis, nausea/vomiting or stool  incontinence Genito-Urinary ROS: negative for - incontinent with spinal stimulator Musculoskeletal ROS: negative for - joint swelling or muscular weakness Neurological ROS: as noted in HPI Dermatological ROS: negative for rash and skin lesion changes  Physical Examination: Blood pressure 140/73, pulse 64, temperature 97.7 F (36.5 C), temperature source Oral, resp. rate 16, height 5' 9.5" (1.765 m), weight 223 lb 6.4 oz (101.334 kg), SpO2 99 %.   Neurological Examination Mental Status: Alert, oriented, thought content appropriate.  Speech fluent without evidence of aphasia.  Able to follow 3 step commands without difficulty. Cranial Nerves: II: Discs flat bilaterally; Visual fields grossly normal, pupils equal, round, reactive to light and accommodation III,IV, VI: ptosis not present, extra-ocular motions intact bilaterally V,VII: smile symmetric, facial light touch sensation normal bilaterally VIII: hearing normal bilaterally IX,X: gag reflex present XI: bilateral shoulder shrug XII: midline tongue extension Motor: Right : Upper extremity   5/5    Left:     Upper extremity   5/5  LE: b/l pt is weak proximally with strength  Improved 4/5 but he is stronger distally at 4+/5.  Tone and bulk:normal tone throughout; no atrophy noted Sensory: Pinprick and light touch intact but does have glove/stocking sensory deficits.  Deep Tendon Reflexes: absent in LE but are present in his upper extremities.   Plantars: Right: downgoing   Left: downgoing Cerebellar: normal finger-to-nose, normal rapid alternating movements and normal heel-to-shin test Gait: normal gait and station      Laboratory Studies:   Basic Metabolic Panel:  Recent Labs Lab 02/25/15 1446 02/26/15 0453 02/27/15 0701  02/28/15 0445 03/01/15 0505  NA 137 139 137 138 138  K 3.4* 3.2* 3.5 3.3* 3.8  CL 98* 103 100* 101 101  CO2 GLUCOSE 427* 196* 238* 217* 227*  BUN CREATININE 1.13  0.85 0.82 0.83 0.77  CALCIUM 8.5* 8.0* 8.2* 8.4* 8.6*    Liver Function Tests:  Recent Labs Lab 02/25/15 1446  AST 23  ALT 6*  ALKPHOS 69  BILITOT 1.5*  PROT 6.9  ALBUMIN 3.0*   No results for input(s): LIPASE, AMYLASE in the last 168 hours. No results for input(s): AMMONIA in the last 168 hours.  CBC:  Recent Labs Lab 02/25/15 1446 02/26/15 0453 02/27/15 0701 02/28/15 0445 03/01/15 0505  WBC 8.8 7.1 6.4 5.9 7.7  NEUTROABS  --   --  4.1 3.8 5.0  HGB 12.9* 12.6* 12.0* 11.9* 12.8*  HCT 39.4* 37.4* 35.7* 35.7* 37.2*  MCV 91.1 91.8 89.9 90.3 91.0  PLT 192 179 179 203 210    Cardiac Enzymes: No results for input(s): CKTOTAL, CKMB, CKMBINDEX, TROPONINI in the last 168 hours.  BNP: Invalid input(s): POCBNP  CBG:  Recent Labs Lab 02/28/15 1134 02/28/15 1631 02/28/15 2227 03/01/15 0746 03/01/15 1148  GLUCAP 293* 284* 259* 232* 296*    Microbiology: Results for orders placed or performed during the hospital encounter of 02/25/15  Culture, blood (routine x 2)     Status: None (Preliminary result)   Collection Time: 02/25/15  2:46 PM  Result Value Ref Range Status   Specimen Description BLOOD RIGHT AC  Final   Special Requests   Final    BOTTLES DRAWN AEROBIC AND ANAEROBIC  AER 6CC ANA 3CC   Culture NO GROWTH 2 DAYS  Final   Report Status PENDING  Incomplete  Culture, blood (routine x 2)     Status: None (Preliminary result)   Collection Time: 02/25/15  2:46 PM  Result Value Ref Range Status   Specimen Description BLOOD RIGHT HAND  Final   Special Requests   Final    BOTTLES DRAWN AEROBIC AND ANAEROBIC  AER 7CC ANA 4CC   Culture NO GROWTH 2 DAYS  Final   Report Status PENDING  Incomplete  Urine culture     Status: None   Collection Time: 02/25/15  2:46 PM  Result Value Ref Range Status   Specimen Description URINE, RANDOM  Final   Special Requests Normal  Final   Culture NO GROWTH 2 DAYS  Final   Report Status 02/27/2015 FINAL  Final    Coagulation  Studies:  Recent Labs  02/27/15 0701 02/28/15 0445 03/01/15 0505  LABPROT 19.4* 17.0* 16.5*  INR 1.62 1.36 1.31    Urinalysis:   Recent Labs Lab 02/25/15 1446  COLORURINE YELLOW*  LABSPEC 1.023  PHURINE 5.0  GLUCOSEU >500*  HGBUR 3+*  BILIRUBINUR NEGATIVE  KETONESUR TRACE*  PROTEINUR 100*  NITRITE NEGATIVE  LEUKOCYTESUR NEGATIVE    Lipid Panel:  No results found for: CHOL, TRIG, HDL, CHOLHDL, VLDL, LDLCALC  HgbA1C:  Lab Results  Component Value Date   HGBA1C 9.8* 02/25/2015    Urine Drug Screen:  No results found for: LABOPIA, COCAINSCRNUR, LABBENZ, AMPHETMU, THCU, LABBARB  Alcohol Level: No results for input(s): ETH in the last 168 hours.   Imaging: No results found.   Assessment/Plan:  81 y.o. male with a known history of Parkinson's disease, diabetes type 2, hypertension, chronic atrial fibrillation on Coumadin, congestive heart failure presents with  acute onset bilateral lower extremity weakness.    Able to ambulate Not GBS D/c planning to rehab D/w family at bedside.  Con't anticoagulation .    Today much better, strength of his LE has improved and was able to stand up with limited PT.   This goes against the GBS diagnosis.   Would not persue aggressive testing such as LP at this point.  Possibly UTI related and deconditioning.  Was able to ambulate with walker.  He does at baseline use a cane or walker     Troy [name]   03/01/2015, 2:47 PM

## 2015-03-01 NOTE — Progress Notes (Signed)
Physical Therapy Treatment Patient Details Name: Troy BurnRobert S Bacigalupi MRN: [id number] DOB: 11/24/1923 Today's Date: 03/01/2015    History of Present Illness Patient is an 81 y/o male that presents with inability to ambulate and decreased LE strength over the course of the past week. Patient has Parkinson's disease, however he has been ambulating with RW or cane down driveway with caregiver. He uses O2 at home intermittently.     PT Comments    Pt demonstrating significant decline with all functional mobility today. Patient complains of significant weakness and "running out of gas" with minimal activity. Family/caregiver note pt has declined over the past few weeks. Pt required increased assist with bed mobility and take increased time/effort. Transfer to stand and to sit required repeated safety cues for proper technique and take increased time as well. Ambulation distance significantly reduced, yet effortful with an increase in heart rate from 72 beats per minute at rest to 124 beats per minute post ambulation. Recommend rehabilitation stay to progress strength, endurance, transfers, ambulation and safety for improved functional mobility before returning home. Discussed with care management.   Follow Up Recommendations  SNF     Equipment Recommendations       Recommendations for Other Services       Precautions / Restrictions Precautions Precautions: Fall Restrictions Weight Bearing Restrictions: No    Mobility  Bed Mobility Overal bed mobility: Needs Assistance Bed Mobility: Supine to Sit     Supine to sit: Mod assist;HOB elevated     General bed mobility comments: Takes significant increased time to mobilize LEs to edge of bed; mod A for trunk and significant increased time/effort to scoot to edge of bed  Transfers Overall transfer level: Needs assistance Equipment used: Rolling walker (2 wheeled) Transfers: Sit to/from Stand Sit to Stand: Min assist (cues for safe hand  placement)         General transfer comment: Repeated cues for safe hand placement before pt performs correctly; very slow to rise with cues for QS and GS to attain upright position  Ambulation/Gait Ambulation/Gait assistance: Min guard Ambulation Distance (Feet): 20 Feet Assistive device: Rolling walker (2 wheeled) Gait Pattern/deviations: Step-through pattern;Decreased dorsiflexion - right;Decreased dorsiflexion - left;Trunk flexed Gait velocity: decreased Gait velocity interpretation: <1.8 ft/sec, indicative of risk for recurrent falls General Gait Details: Extremely slow/effortful; little range at B hip/knee/ankles with swing phase. O2 saturation 95% on 2L post ambulation; HR increased to 124 bpm in short distance   Stairs            Wheelchair Mobility    Modified Rankin (Stroke Patients Only)       Balance Overall balance assessment: Needs assistance Sitting-balance support: Bilateral upper extremity supported Sitting balance-Leahy Scale: Fair Sitting balance - Comments: once assisted to attain upright sitting, able to maintain with UE support Postural control:  (trunk flexed) Standing balance support: Bilateral upper extremity supported Standing balance-Leahy Scale: Fair                      Cognition Arousal/Alertness: Awake/alert (Reports feeling very fatigued and looks fatigued) Behavior During Therapy: WFL for tasks assessed/performed;Flat affect Overall Cognitive Status: History of cognitive impairments - at baseline       Memory: Decreased short-term memory              Exercises General Exercises - Lower Extremity Long Arc Quad: AROM;Both;10 reps;Seated (B knees lacking full extension even with stretch) Hip Flexion/Marching: AROM;Both;10 reps;Seated Toe Raises: AROM;Both;15 reps;Seated Heel Raises:  AROM;Both;15 reps;Seated    General Comments        Pertinent Vitals/Pain Pain Assessment: No/denies pain    Home Living                       Prior Function            PT Goals (current goals can now be found in the care plan section) Progress towards PT goals: Progressing toward goals    Frequency  Min 2X/week    PT Plan Discharge plan needs to be updated    Co-evaluation             End of Session Equipment Utilized During Treatment: Gait belt;Oxygen Activity Tolerance: Patient limited by fatigue (Heart rate increased from 72 to 124 with 20 ft walk) Patient left: in chair;with call bell/phone within reach;with chair alarm set;with family/visitor present (Care management in room)     Time: 1000-1028 PT Time Calculation (min) (ACUTE ONLY): 28 min  Charges:  $Gait Training: 8-22 mins $Therapeutic Exercise: 8-22 mins                    G Codes:      Kristeen Miss 03/01/2015, 11:15 AM

## 2015-03-01 NOTE — Progress Notes (Signed)
Pt to be discharged per MD order. IV removed. Social worker set up pt to be discharged to [facility].

## 2015-03-01 NOTE — Clinical Social Work Note (Signed)
Clinical Social Work Assessment  Patient Details  Name: Troy [name] MRN: [id number] Date of Birth: 1923/10/09  Date of referral:  03/01/15               Reason for consult:  Facility Placement                Permission sought to share information with:  Case Manager, PCP, Family Supports, Magazine features editoracility Contact Representative Permission granted to share information::  Yes, Verbal Permission Granted  Name::        Agency::  [facility]NF[facility]  Relationship::  wife, caregiver  Contact Information:     Housing/Transportation Living arrangements for the past 2 months:  Single Family Home Source of Information:  Patient, Medical Team, Spouse Patient Interpreter Needed:  None Criminal Activity/Legal Involvement Pertinent to Current Situation/Hospitalization:  No - Comment as needed Significant Relationships:  Adult Children, Spouse, Other(Comment) (care giver 5 days a week) Lives with:  Spouse Do you feel safe going back to the place where you live?  No Need for family participation in patient care:  Yes (Comment)  Care giving concerns:  Pt lives at home with a his wife, he has a care giver 5 days of the week as well.    Social Worker assessment / plan:  CSW was referred to Pt to assist with dc planning. Pt came into hospital due to weakness. He lives with his wife of 16 years and has 4 adult children who live nearby. 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 Pt has been to SNF before for STR and believes that he will benefit again from the intensive STR. CSW will fax Pt out to area providers. Pt prefers [facility]. Pt's wife in agreement to plan.   Employment status:  Retired Database administratornsurance information:  Managed Medicare PT Recommendations:  Skilled Nursing Facility Information / Referral to community resources:  Skilled Nursing Facility  Patient/Family's Response to care: Pt's family were anxious about taking Pt home once he was medically cleared due to Pt's decline in  mobility.  Patient/Family's Understanding of and Emotional Response to Diagnosis, Current Treatment, and Prognosis:  Pt has been to STR at SNF twice before over the last several years. He stated that he would [facility] if possible. Family states that they are relieved to know that he will get a little rehab before coming home again.   Emotional Assessment Appearance:  Appears stated age Attitude/Demeanor/Rapport:  Apprehensive Affect (typically observed):  Flat Orientation:  Oriented to Self, Oriented to Place, Oriented to Situation Alcohol / Substance use:  Never Used Psych involvement (Current and /or in the community):  No (Comment)  Discharge Needs  Concerns to be addressed:  Discharge Planning Concerns Readmission within the last 30 days:  No Current discharge risk:  Dependent with Mobility Barriers to Discharge:  Barriers Resolved   Ned Cardara N Alison Kubicki, LCSW 03/01/2015, 3:52 PM

## 2015-03-03 LAB — CULTURE, BLOOD (ROUTINE X 2)
CULTURE: NO GROWTH
Culture: NO GROWTH

## 2015-07-11 ENCOUNTER — Emergency Department: Payer: Medicare Other

## 2015-07-11 ENCOUNTER — Emergency Department
Admission: EM | Admit: 2015-07-11 | Discharge: 2015-07-11 | Disposition: A | Discharge status: Home / Self Care | Payer: Medicare Other | Attending: Emergency Medicine | Admitting: Emergency Medicine

## 2015-07-11 ENCOUNTER — Encounter: Payer: Self-pay | Admitting: Emergency Medicine

## 2015-07-11 DIAGNOSIS — Z87891 Personal history of nicotine dependence: Secondary | ICD-10-CM | POA: Diagnosis not present

## 2015-07-11 DIAGNOSIS — I10 Essential (primary) hypertension: Secondary | ICD-10-CM | POA: Insufficient documentation

## 2015-07-11 DIAGNOSIS — E119 Type 2 diabetes mellitus without complications: Secondary | ICD-10-CM | POA: Diagnosis not present

## 2015-07-11 DIAGNOSIS — Z7984 Long term (current) use of oral hypoglycemic drugs: Secondary | ICD-10-CM | POA: Insufficient documentation

## 2015-07-11 DIAGNOSIS — Z794 Long term (current) use of insulin: Secondary | ICD-10-CM | POA: Diagnosis not present

## 2015-07-11 DIAGNOSIS — Z7901 Long term (current) use of anticoagulants: Secondary | ICD-10-CM | POA: Insufficient documentation

## 2015-07-11 DIAGNOSIS — J209 Acute bronchitis, unspecified: Secondary | ICD-10-CM | POA: Diagnosis not present

## 2015-07-11 DIAGNOSIS — Z79899 Other long term (current) drug therapy: Secondary | ICD-10-CM | POA: Insufficient documentation

## 2015-07-11 DIAGNOSIS — R0602 Shortness of breath: Secondary | ICD-10-CM | POA: Diagnosis present

## 2015-07-11 LAB — BASIC METABOLIC PANEL
ANION GAP: 9 (ref 5–15)
BUN: 15 mg/dL (ref 6–20)
CO2: 29 mmol/L (ref 22–32)
Calcium: 8.6 mg/dL — ABNORMAL LOW (ref 8.9–10.3)
Chloride: 102 mmol/L (ref 101–111)
Creatinine, Ser: 0.98 mg/dL (ref 0.61–1.24)
GFR calc non Af Amer: >60 mL/min (ref >60)
GLUCOSE: 207 mg/dL — AB (ref 65–99)
POTASSIUM: 3.7 mmol/L (ref 3.5–5.1)
Sodium: 140 mmol/L (ref 135–145)

## 2015-07-11 LAB — PROTIME-INR
INR: 2.59
PROTHROMBIN TIME: 27.4 s — AB (ref 11.4–15.0)

## 2015-07-11 LAB — CBC
HEMATOCRIT: 38.7 % — AB (ref 40.0–52.0)
HEMOGLOBIN: 12.8 g/dL — AB (ref 13.0–18.0)
MCH: 29.7 pg (ref 26.0–34.0)
MCHC: 33 g/dL (ref 32.0–36.0)
MCV: 90 fL (ref 80.0–100.0)
Platelets: 178 10*3/uL (ref 150–440)
RBC: 4.3 MIL/uL — AB (ref 4.40–5.90)
RDW: 14.6 % — ABNORMAL HIGH (ref 11.5–14.5)
WBC: 7.2 10*3/uL (ref 3.8–10.6)

## 2015-07-11 LAB — TROPONIN I
TROPONIN I: 0.04 ng/mL — AB (ref <0.031)
Troponin I: 0.04 ng/mL — ABNORMAL HIGH (ref <0.031)

## 2015-07-11 MED ORDER — ALBUTEROL SULFATE (2.5 MG/3ML) 0.083% IN NEBU
5.0000 mg | INHALATION_SOLUTION | Freq: Once | RESPIRATORY_TRACT | Status: AC
  Administered 2015-07-11: 5 mg via RESPIRATORY_TRACT
  Filled 2015-07-11: qty 6

## 2015-07-11 MED ORDER — ALBUTEROL SULFATE HFA 108 (90 BASE) MCG/ACT IN AERS: 2.0000 | INHALATION_SPRAY | RESPIRATORY_TRACT | Status: DC | PRN

## 2015-07-11 MED ORDER — PREDNISONE 20 MG PO TABS
40.0000 mg | ORAL_TABLET | ORAL | Status: AC
  Administered 2015-07-11: 40 mg via ORAL
  Filled 2015-07-11: qty 2

## 2015-07-11 MED ORDER — PREDNISONE 20 MG PO TABS: 40.0000 mg | ORAL_TABLET | Freq: Every day | ORAL | Status: DC

## 2015-07-11 MED ORDER — IPRATROPIUM-ALBUTEROL 0.5-2.5 (3) MG/3ML IN SOLN
3.0000 mL | Freq: Once | RESPIRATORY_TRACT | Status: AC
Start: 2015-07-11 — End: 2015-07-11
  Administered 2015-07-11: 3 mL via RESPIRATORY_TRACT
  Filled 2015-07-11: qty 3

## 2015-07-11 NOTE — Discharge Instructions (Signed)

## 2015-07-11 NOTE — ED Notes (Signed)
Patient presents to the ED with increased shortness of breath since Sunday.  Patient started taking a z-pack on Monday but symptoms have not improved.  Patient's breathing is somewhat shallow.  Patient is in no obvious distress in triage.  Patient is on oxygen at home on 2L all the time.  Patient's family states patient does not have COPD.  Patient has a history of Afib.  Patient coughing up yellow mucous.

## 2015-07-11 NOTE — ED Notes (Addendum)
Pt in via triage with complaints of increasing shortness of breath since Monday; PCP consulted on Monday and pt started on Zpac.  Pt reports symptoms not improving since then.  Pt reports productive cough since Sunday.  Pt able to speak full sentences, no immediate distress at this time.

## 2015-07-11 NOTE — ED Notes (Signed)
Checked on pt. And asked if they needed anything. They verbalized that "they were just ready to go home" and that they did not need anything from staff at this time

## 2015-07-11 NOTE — ED Provider Notes (Signed)
[facility] Emergency Department Provider Note  ____________________________________________  Time seen: 5:15 PM  I have reviewed the triage vital signs and the nursing notes.   HISTORY  Chief Complaint Shortness of Breath    HPI Troy [name] is a 82 y.o. male complains of shortness of breath and nonproductive cough. No fevers or chills. Has been taking a Z-Pak for the past few days prescribed by his doctor. Denies chest pain or belly pain or back pain. No exertional symptoms.     Past Medical History  Diagnosis Date  . Diabetes mellitus without complication (HCC)   . Hypertension   . UTI (lower urinary tract infection)   . Parkinson's disease (HCC)   . TIA (transient ischemic attack)   . Atrial fibrillation (HCC)   . CHF (congestive heart failure) (HCC)   . Stroke (HCC)   . Hypothyroidism      Patient Active Problem List   Diagnosis Date Noted  . Lower extremity weakness 02/25/2015     Past Surgical History  Procedure Laterality Date  . Cardiac surgery    . Cholecystectomy       Current Outpatient Rx  Name  Route  Sig  Dispense  Refill  . azithromycin (ZITHROMAX) 250 MG tablet   Oral   Take 250-500 mg by mouth daily. Pt is to take two tablets on day one and one tablet for the next four days.         . carbidopa-levodopa (SINEMET IR) 25-250 MG tablet   Oral   Take 1 tablet by mouth 3 (three) times daily.         . cetirizine (ZYRTEC) 10 MG tablet   Oral   Take 10 mg by mouth daily.         . furosemide (LASIX) 20 MG tablet   Oral   Take 20 mg by mouth daily.         [name] glipiZIDE (GLUCOTROL) 10 MG tablet   Oral   Take 5-10 mg by mouth 2 (two) times daily. Pt takes one tablet in the morning and one-half tablet in the evening.         . insulin regular human CONCENTRATED (HUMULIN R) 500 UNIT/ML injection   Subcutaneous   Inject 4-6 Units into the skin 2 (two) times daily with a meal. Pt uses four units with  breakfast and six units with dinner.         [name] lisinopril (PRINIVIL,ZESTRIL) 40 MG tablet   Oral   Take 40 mg by mouth at bedtime.          . metoprolol succinate (TOPROL-XL) 50 MG 24 hr tablet   Oral   Take 50 mg by mouth every evening. Take with or immediately following a meal.         . Multiple Vitamins-Minerals (PRESERVISION AREDS 2) CAPS   Oral   Take 1 capsule by mouth 2 (two) times daily.         Bertram Gala Glycol-Propyl Glycol (SYSTANE OP)   Both Eyes   Place 1 drop into both eyes 2 (two) times daily.         . saxagliptin HCl (ONGLYZA) 2.5 MG TABS tablet   Oral   Take 5 mg by mouth daily.         [name] warfarin (COUMADIN) 3 MG tablet   Oral   Take 3 mg by mouth every evening. Pt takes on Tuesday, Thursday, and Saturday.         [name]  warfarin (COUMADIN) 6 MG tablet   Oral   Take 6 mg by mouth every evening. Pt takes on Monday, Wednesday, Friday, and Sunday.         [name] albuterol (PROVENTIL HFA) 108 (90 Base) MCG/ACT inhaler   Inhalation   Inhale 2 puffs into the lungs every 4 (four) hours as needed for wheezing or shortness of breath.   1 Inhaler   0   . predniSONE (DELTASONE) 20 MG tablet   Oral   Take 2 tablets (40 mg total) by mouth daily.   8 tablet   0      Allergies Review of patient's allergies indicates no known allergies.   Family History  Problem Relation Age of Onset  . Hypertension Mother   . Stroke Mother   . CAD Father   . Lymphoma Sister   . Lung disease Brother     Social History Social History  Substance Use Topics  . Smoking status: Former Games developer  . Smokeless tobacco: Never Used  . Alcohol Use: No    Review of Systems  Constitutional:   No fever or chills. No weight changes Eyes:   No blurry vision or double vision.  ENT:   No sore throat.  Cardiovascular:   No chest pain. Respiratory:   As of shortness of breath and nonproductive cough. Gastrointestinal:   Negative for abdominal pain, vomiting and diarrhea.   No BRBPR or melena. Genitourinary:   Negative for dysuria or difficulty urinating. Musculoskeletal:   Negative for back pain. No joint swelling or pain. Skin:   Negative for rash. Neurological:   Negative for headaches, focal weakness or numbness. Psychiatric:  No anxiety or depression.   Endocrine:  No changes in energy or sleep difficulty.  10-point ROS otherwise negative.  ____________________________________________   PHYSICAL EXAM:  VITAL SIGNS: ED Triage Vitals  Enc Vitals Group     BP 07/11/15 1532 153/84 mmHg     Pulse Rate 07/11/15 1532 85     Resp 07/11/15 1532 22     Temp 07/11/15 1532 99.4 F (37.4 C)     Temp Source 07/11/15 1532 Oral     SpO2 07/11/15 1532 91 %     Weight 07/11/15 1532 230 lb (104.327 kg)     Height 07/11/15 1532  (1.753 m)     Head Cir --      Peak Flow --      Pain Score 07/11/15 1533 0     Pain Loc --      Pain Edu? --      Excl. in GC? --     Vital signs reviewed, nursing assessments reviewed.   Constitutional:   Alert and oriented. Well appearing and in no distress. Eyes:   No scleral icterus. No conjunctival pallor. PERRL. EOMI ENT   Head:   Normocephalic and atraumatic.   Nose:   No congestion/rhinnorhea. No septal hematoma   Mouth/Throat:   MMM, no pharyngeal erythema. No peritonsillar mass.    Neck:   No stridor. No SubQ emphysema. No meningismus. Hematological/Lymphatic/Immunilogical:   No cervical lymphadenopathy. Cardiovascular:   RRR. Symmetric bilateral radial and DP pulses.  No murmurs.  Respiratory:   Normal respiratory effort without tachypnea nor retractions. Slight end expiratory wheezing, accentuated with forceful expiration.  Gastrointestinal:   Soft and nontender. Non distended. There is no CVA tenderness.  No rebound, rigidity, or guarding. Genitourinary:   deferred Musculoskeletal:   Nontender with normal range of motion in all extremities.  No joint effusions.  No lower extremity tenderness.   No edema. Neurologic:   Normal speech and language.  CN 2-10 normal. Motor grossly intact. No gross focal neurologic deficits are appreciated.  Skin:    Skin is warm, dry and intact. No rash noted.  No petechiae, purpura, or bullae. Psychiatric:   Mood and affect are normal. ____________________________________________    LABS (pertinent positives/negatives) (all labs ordered are listed, but only abnormal results are displayed) Labs Reviewed  BASIC METABOLIC PANEL - Abnormal; Notable for the following:    Glucose, Bld 207 (*)    Calcium 8.6 (*)    All other components within normal limits  CBC - Abnormal; Notable for the following:    RBC 4.30 (*)    Hemoglobin 12.8 (*)    HCT 38.7 (*)    RDW 14.6 (*)    All other components within normal limits  TROPONIN I - Abnormal; Notable for the following:    Troponin I 0.04 (*)    All other components within normal limits  PROTIME-INR - Abnormal; Notable for the following:    Prothrombin Time 27.4 (*)    All other components within normal limits  TROPONIN I   ____________________________________________   EKG  Interpreted by me Atrial fibrillation rate of 82, left axis, normal intervals. Normal QRS and ST segments and T waves. 2 PVCs on the strip  ____________________________________________    RADIOLOGY  Chest x-ray unremarkable  ____________________________________________   PROCEDURES   ____________________________________________   INITIAL IMPRESSION / ASSESSMENT AND PLAN / ED COURSE  Pertinent labs & imaging results that were available during my care of the patient were reviewed by me and considered in my medical decision making (see chart for details).  Patient presents with shortness of breath and expert or wheezing. Low suspicion for ACS PE or dissection. No evidence of heart failure or pneumothorax. Chest x-ray negative. Vital signs unremarkable. Likely acute bronchitis. Patient given steroids and nebulizer  treatments and feels much better. An initial troponin was sent from triage which came back at 0.04. He has absolutely no symptoms to suggest that this is cardiac related. Very low suspicion for Pericarditis or myocarditis. We'll check a interval troponin and plan for discharge home unless it is markedly elevated.    ----------------------------------------- 8:25 PM on 07/11/2015 -----------------------------------------  Repeat troponin completely unchanged. Likely related to his chronic atrial fibrillation and baseline state of health. We'll discharge home with prednisone and inhaler. Follow-up with primary care. Is already taking azithromycin which he will continue.  ____________________________________________   FINAL CLINICAL IMPRESSION(S) / ED DIAGNOSES  Final diagnoses:  Acute bronchitis, unspecified organism      Sharman CheekPhillip Stephfon Bovey, MD 07/11/15 2026

## 2015-08-03 ENCOUNTER — Emergency Department: Payer: Medicare Other

## 2015-08-03 ENCOUNTER — Encounter: Payer: Self-pay | Admitting: Emergency Medicine

## 2015-08-03 ENCOUNTER — Inpatient Hospital Stay
Admission: EM | Admit: 2015-08-03 | Discharge: 2015-08-07 | DRG: 291 | Disposition: A | Discharge status: Other Acute Inpatient Hospital | Payer: Medicare Other | Attending: Internal Medicine | Admitting: Internal Medicine

## 2015-08-03 DIAGNOSIS — E039 Hypothyroidism, unspecified: Secondary | ICD-10-CM | POA: Diagnosis present

## 2015-08-03 DIAGNOSIS — Z87891 Personal history of nicotine dependence: Secondary | ICD-10-CM | POA: Diagnosis not present

## 2015-08-03 DIAGNOSIS — E871 Hypo-osmolality and hyponatremia: Secondary | ICD-10-CM | POA: Diagnosis present

## 2015-08-03 DIAGNOSIS — Z8249 Family history of ischemic heart disease and other diseases of the circulatory system: Secondary | ICD-10-CM | POA: Diagnosis not present

## 2015-08-03 DIAGNOSIS — G20 Parkinson's disease: Secondary | ICD-10-CM | POA: Diagnosis present

## 2015-08-03 DIAGNOSIS — G4733 Obstructive sleep apnea (adult) (pediatric): Secondary | ICD-10-CM | POA: Diagnosis present

## 2015-08-03 DIAGNOSIS — Z66 Do not resuscitate: Secondary | ICD-10-CM | POA: Diagnosis present

## 2015-08-03 DIAGNOSIS — I5033 Acute on chronic diastolic (congestive) heart failure: Secondary | ICD-10-CM

## 2015-08-03 DIAGNOSIS — Z79899 Other long term (current) drug therapy: Secondary | ICD-10-CM

## 2015-08-03 DIAGNOSIS — I110 Hypertensive heart disease with heart failure: Principal | ICD-10-CM | POA: Diagnosis present

## 2015-08-03 DIAGNOSIS — I5023 Acute on chronic systolic (congestive) heart failure: Secondary | ICD-10-CM | POA: Diagnosis present

## 2015-08-03 DIAGNOSIS — I472 Ventricular tachycardia: Secondary | ICD-10-CM | POA: Diagnosis present

## 2015-08-03 DIAGNOSIS — I482 Chronic atrial fibrillation, unspecified: Secondary | ICD-10-CM | POA: Insufficient documentation

## 2015-08-03 DIAGNOSIS — Z794 Long term (current) use of insulin: Secondary | ICD-10-CM | POA: Diagnosis not present

## 2015-08-03 DIAGNOSIS — R4189 Other symptoms and signs involving cognitive functions and awareness: Secondary | ICD-10-CM | POA: Insufficient documentation

## 2015-08-03 DIAGNOSIS — Z952 Presence of prosthetic heart valve: Secondary | ICD-10-CM | POA: Diagnosis not present

## 2015-08-03 DIAGNOSIS — Z7901 Long term (current) use of anticoagulants: Secondary | ICD-10-CM

## 2015-08-03 DIAGNOSIS — Z823 Family history of stroke: Secondary | ICD-10-CM | POA: Diagnosis not present

## 2015-08-03 DIAGNOSIS — J440 Chronic obstructive pulmonary disease with acute lower respiratory infection: Secondary | ICD-10-CM | POA: Diagnosis present

## 2015-08-03 DIAGNOSIS — R0600 Dyspnea, unspecified: Secondary | ICD-10-CM | POA: Diagnosis present

## 2015-08-03 DIAGNOSIS — Z8673 Personal history of transient ischemic attack (TIA), and cerebral infarction without residual deficits: Secondary | ICD-10-CM | POA: Diagnosis not present

## 2015-08-03 DIAGNOSIS — Z807 Family history of other malignant neoplasms of lymphoid, hematopoietic and related tissues: Secondary | ICD-10-CM

## 2015-08-03 DIAGNOSIS — I4430 Unspecified atrioventricular block: Secondary | ICD-10-CM | POA: Diagnosis present

## 2015-08-03 DIAGNOSIS — N179 Acute kidney failure, unspecified: Secondary | ICD-10-CM | POA: Diagnosis present

## 2015-08-03 DIAGNOSIS — J209 Acute bronchitis, unspecified: Secondary | ICD-10-CM | POA: Diagnosis present

## 2015-08-03 DIAGNOSIS — R404 Transient alteration of awareness: Secondary | ICD-10-CM | POA: Diagnosis not present

## 2015-08-03 DIAGNOSIS — J441 Chronic obstructive pulmonary disease with (acute) exacerbation: Secondary | ICD-10-CM | POA: Diagnosis present

## 2015-08-03 DIAGNOSIS — I4510 Unspecified right bundle-branch block: Secondary | ICD-10-CM | POA: Diagnosis present

## 2015-08-03 DIAGNOSIS — I2510 Atherosclerotic heart disease of native coronary artery without angina pectoris: Secondary | ICD-10-CM | POA: Diagnosis present

## 2015-08-03 DIAGNOSIS — I495 Sick sinus syndrome: Secondary | ICD-10-CM | POA: Diagnosis present

## 2015-08-03 DIAGNOSIS — I5022 Chronic systolic (congestive) heart failure: Secondary | ICD-10-CM | POA: Diagnosis not present

## 2015-08-03 DIAGNOSIS — I455 Other specified heart block: Secondary | ICD-10-CM | POA: Insufficient documentation

## 2015-08-03 DIAGNOSIS — J9621 Acute and chronic respiratory failure with hypoxia: Secondary | ICD-10-CM | POA: Diagnosis present

## 2015-08-03 DIAGNOSIS — I509 Heart failure, unspecified: Secondary | ICD-10-CM | POA: Insufficient documentation

## 2015-08-03 DIAGNOSIS — I340 Nonrheumatic mitral (valve) insufficiency: Secondary | ICD-10-CM | POA: Diagnosis present

## 2015-08-03 DIAGNOSIS — R001 Bradycardia, unspecified: Secondary | ICD-10-CM | POA: Diagnosis not present

## 2015-08-03 DIAGNOSIS — E785 Hyperlipidemia, unspecified: Secondary | ICD-10-CM | POA: Diagnosis present

## 2015-08-03 DIAGNOSIS — E1142 Type 2 diabetes mellitus with diabetic polyneuropathy: Secondary | ICD-10-CM | POA: Diagnosis present

## 2015-08-03 DIAGNOSIS — J9620 Acute and chronic respiratory failure, unspecified whether with hypoxia or hypercapnia: Secondary | ICD-10-CM | POA: Diagnosis not present

## 2015-08-03 HISTORY — DX: Nonrheumatic mitral (valve) prolapse: I34.1

## 2015-08-03 HISTORY — DX: Chronic systolic (congestive) heart failure: I50.22

## 2015-08-03 HISTORY — DX: Encounter for observation for other suspected diseases and conditions ruled out: Z03.89

## 2015-08-03 HISTORY — DX: Reserved for inherently not codable concepts without codable children: IMO0001

## 2015-08-03 HISTORY — DX: Chronic atrial fibrillation, unspecified: I48.20

## 2015-08-03 LAB — HEPATIC FUNCTION PANEL
ALBUMIN: 3.4 g/dL — AB (ref 3.5–5.0)
ALT: 7 U/L — ABNORMAL LOW (ref 17–63)
AST: 19 U/L (ref 15–41)
Alkaline Phosphatase: 76 U/L (ref 38–126)
BILIRUBIN DIRECT: 0.2 mg/dL (ref 0.1–0.5)
Indirect Bilirubin: 0.9 mg/dL (ref 0.3–0.9)
Total Bilirubin: 1.1 mg/dL (ref 0.3–1.2)
Total Protein: 7.1 g/dL (ref 6.5–8.1)

## 2015-08-03 LAB — BASIC METABOLIC PANEL
Anion gap: 6 (ref 5–15)
BUN: 15 mg/dL (ref 6–20)
CALCIUM: 8.3 mg/dL — AB (ref 8.9–10.3)
CO2: 29 mmol/L (ref 22–32)
CREATININE: 0.84 mg/dL (ref 0.61–1.24)
Chloride: 104 mmol/L (ref 101–111)
GFR calc Af Amer: >60 mL/min (ref >60)
GLUCOSE: 145 mg/dL — AB (ref 65–99)
Potassium: 3.8 mmol/L (ref 3.5–5.1)
Sodium: 139 mmol/L (ref 135–145)

## 2015-08-03 LAB — BLOOD GAS, ARTERIAL
ACID-BASE EXCESS: 4.6 mmol/L — AB (ref 0.0–3.0)
Bicarbonate: 32 mEq/L — ABNORMAL HIGH (ref 21.0–28.0)
FIO2: 1
O2 SAT: 99.8 %
Patient temperature: 37
pCO2 arterial: 58 mmHg — ABNORMAL HIGH (ref 32.0–48.0)
pH, Arterial: 7.35 (ref 7.350–7.450)
pO2, Arterial: 232 mmHg — ABNORMAL HIGH (ref 83.0–108.0)

## 2015-08-03 LAB — CBC WITH DIFFERENTIAL/PLATELET
BASOS ABS: 0.1 10*3/uL (ref 0–0.1)
Basophils Relative: 1 %
EOS PCT: 6 %
Eosinophils Absolute: 0.4 10*3/uL (ref 0–0.7)
HEMATOCRIT: 40.2 % (ref 40.0–52.0)
Hemoglobin: 13.2 g/dL (ref 13.0–18.0)
LYMPHS PCT: 20 %
Lymphs Abs: 1.5 10*3/uL (ref 1.0–3.6)
MCH: 28.9 pg (ref 26.0–34.0)
MCHC: 32.8 g/dL (ref 32.0–36.0)
MCV: 88 fL (ref 80.0–100.0)
MONO ABS: 0.7 10*3/uL (ref 0.2–1.0)
MONOS PCT: 10 %
NEUTROS ABS: 4.6 10*3/uL (ref 1.4–6.5)
Neutrophils Relative %: 63 %
PLATELETS: 158 10*3/uL (ref 150–440)
RBC: 4.57 MIL/uL (ref 4.40–5.90)
RDW: 15.1 % — AB (ref 11.5–14.5)
WBC: 7.2 10*3/uL (ref 3.8–10.6)

## 2015-08-03 LAB — APTT: APTT: 47 s — AB (ref 24–36)

## 2015-08-03 LAB — PROTIME-INR
INR: 3.64
Prothrombin Time: 35.4 seconds — ABNORMAL HIGH (ref 11.4–15.0)

## 2015-08-03 LAB — GLUCOSE, CAPILLARY
GLUCOSE-CAPILLARY: 245 mg/dL — AB (ref 65–99)
Glucose-Capillary: 125 mg/dL — ABNORMAL HIGH (ref 65–99)

## 2015-08-03 LAB — TROPONIN I: Troponin I: <0.03 ng/mL (ref <0.031)

## 2015-08-03 LAB — MAGNESIUM: MAGNESIUM: 1.9 mg/dL (ref 1.7–2.4)

## 2015-08-03 LAB — RAPID INFLUENZA A&B ANTIGENS
Influenza A (ARMC): NEGATIVE
Influenza B (ARMC): NEGATIVE

## 2015-08-03 LAB — BRAIN NATRIURETIC PEPTIDE: B Natriuretic Peptide: 65 pg/mL (ref 0.0–100.0)

## 2015-08-03 MED ORDER — CARBIDOPA-LEVODOPA 25-250 MG PO TABS
1.0000 | ORAL_TABLET | Freq: Three times a day (TID) | ORAL | Status: DC
  Administered 2015-08-03 – 2015-08-07 (×12): 1 via ORAL
  Filled 2015-08-03 (×15): qty 1

## 2015-08-03 MED ORDER — ONDANSETRON HCL 4 MG PO TABS: 4.0000 mg | ORAL_TABLET | Freq: Four times a day (QID) | ORAL | Status: DC | PRN

## 2015-08-03 MED ORDER — IPRATROPIUM-ALBUTEROL 0.5-2.5 (3) MG/3ML IN SOLN
3.0000 mL | RESPIRATORY_TRACT | Status: DC
  Administered 2015-08-03 – 2015-08-07 (×19): 3 mL via RESPIRATORY_TRACT
  Filled 2015-08-03 (×19): qty 3

## 2015-08-03 MED ORDER — BUDESONIDE 0.25 MG/2ML IN SUSP
0.2500 mg | Freq: Two times a day (BID) | RESPIRATORY_TRACT | Status: DC
  Administered 2015-08-03 – 2015-08-07 (×5): 0.25 mg via RESPIRATORY_TRACT
  Filled 2015-08-03 (×5): qty 2

## 2015-08-03 MED ORDER — LISINOPRIL 20 MG PO TABS
40.0000 mg | ORAL_TABLET | Freq: Every day | ORAL | Status: DC
  Administered 2015-08-03 – 2015-08-06 (×4): 40 mg via ORAL
  Filled 2015-08-03: qty 2
  Filled 2015-08-03: qty 4
  Filled 2015-08-03 (×2): qty 2

## 2015-08-03 MED ORDER — INSULIN ASPART 100 UNIT/ML ~~LOC~~ SOLN
0.0000 [IU] | Freq: Every day | SUBCUTANEOUS | Status: DC
  Administered 2015-08-03: 2 [IU] via SUBCUTANEOUS
  Administered 2015-08-04 – 2015-08-06 (×2): 3 [IU] via SUBCUTANEOUS
  Filled 2015-08-03 (×2): qty 3
  Filled 2015-08-03: qty 2

## 2015-08-03 MED ORDER — SODIUM CHLORIDE 0.9% FLUSH
3.0000 mL | Freq: Two times a day (BID) | INTRAVENOUS | Status: DC
  Administered 2015-08-03 – 2015-08-07 (×8): 3 mL via INTRAVENOUS

## 2015-08-03 MED ORDER — METHYLPREDNISOLONE SODIUM SUCC 40 MG IJ SOLR
40.0000 mg | Freq: Four times a day (QID) | INTRAMUSCULAR | Status: DC
  Administered 2015-08-03 – 2015-08-04 (×2): 40 mg via INTRAVENOUS
  Filled 2015-08-03 (×2): qty 1

## 2015-08-03 MED ORDER — LORATADINE 10 MG PO TABS
10.0000 mg | ORAL_TABLET | Freq: Every day | ORAL | Status: DC
  Administered 2015-08-03 – 2015-08-07 (×5): 10 mg via ORAL
  Filled 2015-08-03 (×5): qty 1

## 2015-08-03 MED ORDER — NITROGLYCERIN 2 % TD OINT
1.0000 [in_us] | TOPICAL_OINTMENT | Freq: Once | TRANSDERMAL | Status: AC
  Administered 2015-08-03: 1 [in_us] via TOPICAL
  Filled 2015-08-03: qty 1

## 2015-08-03 MED ORDER — OCUVITE-LUTEIN PO CAPS
1.0000 | ORAL_CAPSULE | Freq: Two times a day (BID) | ORAL | Status: DC
  Administered 2015-08-03 – 2015-08-07 (×7): 1 via ORAL
  Filled 2015-08-03 (×10): qty 1

## 2015-08-03 MED ORDER — IPRATROPIUM-ALBUTEROL 0.5-2.5 (3) MG/3ML IN SOLN
RESPIRATORY_TRACT | Status: AC
  Administered 2015-08-03: 3 mL via RESPIRATORY_TRACT
  Filled 2015-08-03: qty 3

## 2015-08-03 MED ORDER — WARFARIN - PHARMACIST DOSING INPATIENT
Freq: Every day | Status: DC
  Filled 2015-08-03 (×6): qty 1

## 2015-08-03 MED ORDER — ACETAMINOPHEN 650 MG RE SUPP: 650.0000 mg | Freq: Four times a day (QID) | RECTAL | Status: DC | PRN

## 2015-08-03 MED ORDER — FUROSEMIDE 10 MG/ML IJ SOLN
40.0000 mg | Freq: Two times a day (BID) | INTRAMUSCULAR | Status: DC
  Administered 2015-08-03 – 2015-08-04 (×2): 40 mg via INTRAVENOUS
  Filled 2015-08-03 (×2): qty 4

## 2015-08-03 MED ORDER — IPRATROPIUM-ALBUTEROL 0.5-2.5 (3) MG/3ML IN SOLN
3.0000 mL | Freq: Once | RESPIRATORY_TRACT | Status: AC
  Administered 2015-08-03: 3 mL via RESPIRATORY_TRACT

## 2015-08-03 MED ORDER — ALBUTEROL SULFATE (2.5 MG/3ML) 0.083% IN NEBU: 3.0000 mL | INHALATION_SOLUTION | RESPIRATORY_TRACT | Status: DC | PRN

## 2015-08-03 MED ORDER — INSULIN ASPART 100 UNIT/ML ~~LOC~~ SOLN
0.0000 [IU] | Freq: Three times a day (TID) | SUBCUTANEOUS | Status: DC
  Administered 2015-08-03: 3 [IU] via SUBCUTANEOUS
  Administered 2015-08-04 (×3): 15 [IU] via SUBCUTANEOUS
  Administered 2015-08-05: 20 [IU] via SUBCUTANEOUS
  Administered 2015-08-05: 7 [IU] via SUBCUTANEOUS
  Administered 2015-08-05: 15 [IU] via SUBCUTANEOUS
  Administered 2015-08-06: 7 [IU] via SUBCUTANEOUS
  Administered 2015-08-06: 11 [IU] via SUBCUTANEOUS
  Administered 2015-08-06: 4 [IU] via SUBCUTANEOUS
  Administered 2015-08-07: 15 [IU] via SUBCUTANEOUS
  Administered 2015-08-07: 11 [IU] via SUBCUTANEOUS
  Administered 2015-08-07: 7 [IU] via SUBCUTANEOUS
  Filled 2015-08-03: qty 11
  Filled 2015-08-03: qty 7
  Filled 2015-08-03: qty 4
  Filled 2015-08-03 (×2): qty 15
  Filled 2015-08-03: qty 11
  Filled 2015-08-03: qty 3
  Filled 2015-08-03: qty 15
  Filled 2015-08-03: qty 20
  Filled 2015-08-03 (×2): qty 15
  Filled 2015-08-03 (×2): qty 7

## 2015-08-03 MED ORDER — ACETAMINOPHEN 325 MG PO TABS: 650.0000 mg | ORAL_TABLET | Freq: Four times a day (QID) | ORAL | Status: DC | PRN

## 2015-08-03 MED ORDER — FUROSEMIDE 10 MG/ML IJ SOLN
40.0000 mg | Freq: Once | INTRAMUSCULAR | Status: AC
  Administered 2015-08-03: 40 mg via INTRAVENOUS
  Filled 2015-08-03: qty 4

## 2015-08-03 MED ORDER — METOPROLOL SUCCINATE ER 50 MG PO TB24
50.0000 mg | ORAL_TABLET | Freq: Every evening | ORAL | Status: DC
  Administered 2015-08-03 – 2015-08-04 (×2): 50 mg via ORAL
  Filled 2015-08-03 (×3): qty 1

## 2015-08-03 MED ORDER — ONDANSETRON HCL 4 MG/2ML IJ SOLN
4.0000 mg | Freq: Four times a day (QID) | INTRAMUSCULAR | Status: DC | PRN
  Administered 2015-08-06: 4 mg via INTRAVENOUS
  Filled 2015-08-03: qty 2

## 2015-08-03 NOTE — ED Notes (Signed)
Pt was given meal tray per pt request. Per admission orders pt is able to eat heart healthy diet. Pt given water as well.

## 2015-08-03 NOTE — ED Notes (Signed)
Pt arrived from home by EMS. Pt states he has had a head cold x2 week and became SOB today. Pt states he has congestion and is coughing up gray mucus.

## 2015-08-03 NOTE — H&P (Signed)
[facility] [place] at [facility]   PATIENT NAME: Troy [name]    MR#:  [id number]  DATE OF BIRTH:  1923/12/19  DATE OF ADMISSION:  08/03/2015  PRIMARY CARE PHYSICIAN: Barbette Reichmann, MD   REQUESTING/REFERRING PHYSICIAN: Dr. Loleta [name]  CHIEF COMPLAINT:   Chief Complaint  Patient presents with  . Shortness of Breath    HISTORY OF PRESENT ILLNESS:  Troy [name]  is a 82 y.o. male with a known history of diabetes type 2 without complication, essential hypertension, Parkinson's disease, previous history of TIA, chronic afibrillation, history of CHF, history of previous CVA, hypothyroidism who presents to the hospital due to shortness of breath over the past week or so. As per the wife patient's shortness of breath was initially just with exertion but it has progressively gotten worse when he's had audible wheezing and cough now for the past few days. He also has not been able to sleep at night due to the cough and shortness of breath.  Due to his progressive shortness of breath he went to see his primary care physician who increased his dose of Lasix but despite that his shortness of breath has not improved and therefore he came to the ER for further evaluation. In the emergency room patient was noted to be in acute respiratory failure with hypoxia with significant audible wheezing. Hospitalist services were contacted further treatment evaluation. Patient's cough is productive of green-yellow sputum, no documented fever though, no chest pain, nausea, vomiting, abdominal pain or any other associated symptoms presently.  PAST MEDICAL HISTORY:   Past Medical History  Diagnosis Date  . Diabetes mellitus without complication (HCC)   . Hypertension   . UTI (lower urinary tract infection)   . Parkinson's disease (HCC)   . TIA (transient ischemic attack)   . Atrial fibrillation (HCC)   . CHF (congestive heart failure) (HCC)   . Stroke (HCC)   . Hypothyroidism      PAST SURGICAL HISTORY:   Past Surgical History  Procedure Laterality Date  . Cardiac surgery    . Cholecystectomy      SOCIAL HISTORY:   Social History  Substance Use Topics  . Smoking status: Former Smoker -- 4.00 packs/day for 20 years    Types: Cigarettes  . Smokeless tobacco: Never Used  . Alcohol Use: No    FAMILY HISTORY:   Family History  Problem Relation Age of Onset  . Hypertension Mother   . Stroke Mother   . CAD Father   . Lymphoma Sister   . Lung disease Brother     DRUG ALLERGIES:  No Known Allergies  REVIEW OF SYSTEMS:   Review of Systems  Constitutional: Negative for fever and weight loss.  HENT: Negative for congestion, nosebleeds and tinnitus.   Eyes: Negative for blurred vision, double vision and redness.  Respiratory: Positive for cough, sputum production, shortness of breath and wheezing. Negative for hemoptysis.   Cardiovascular: Negative for chest pain, orthopnea, leg swelling and PND.  Gastrointestinal: Negative for nausea, vomiting, abdominal pain, diarrhea and melena.  Genitourinary: Negative for dysuria, urgency and hematuria.  Musculoskeletal: Negative for joint pain and falls.  Neurological: Positive for weakness (Generalized). Negative for dizziness, tingling, sensory change, focal weakness, seizures and headaches.  Endo/Heme/Allergies: Negative for polydipsia. Does not bruise/bleed easily.  Psychiatric/Behavioral: Negative for depression and memory loss. The patient is not nervous/anxious.     MEDICATIONS AT HOME:   Prior to Admission medications   Medication Sig Start Date End Date  Taking? Authorizing Provider  albuterol (PROVENTIL HFA) 108 (90 Base) MCG/ACT inhaler Inhale 2 puffs into the lungs every 4 (four) hours as needed for wheezing or shortness of breath. 07/11/15  Yes Sharman CheekPhillip Stafford, MD  carbidopa-levodopa (SINEMET IR) 25-250 MG tablet Take 1 tablet by mouth 3 (three) times daily.   Yes Historical Provider, MD   cetirizine (ZYRTEC) 10 MG tablet Take 10 mg by mouth daily.   Yes Historical Provider, MD  furosemide (LASIX) 40 MG tablet Take 40 mg by mouth daily.   Yes Historical Provider, MD  glipiZIDE (GLUCOTROL) 10 MG tablet Take 5-10 mg by mouth 2 (two) times daily. Pt takes one tablet in the morning and one-half tablet in the evening.   Yes Historical Provider, MD  insulin regular human CONCENTRATED (HUMULIN R) 500 UNIT/ML injection Inject 4-6 Units into the skin 2 (two) times daily with a meal. Pt uses four units with breakfast and six units with dinner.   Yes Historical Provider, MD  lisinopril (PRINIVIL,ZESTRIL) 40 MG tablet Take 40 mg by mouth at bedtime.    Yes Historical Provider, MD  metoprolol succinate (TOPROL-XL) 50 MG 24 hr tablet Take 50 mg by mouth every evening. Take with or immediately following a meal.   Yes Historical Provider, MD  Multiple Vitamins-Minerals (PRESERVISION AREDS 2) CAPS Take 1 capsule by mouth 2 (two) times daily.   Yes Historical Provider, MD  saxagliptin HCl (ONGLYZA) 2.5 MG TABS tablet Take 5 mg by mouth daily.   Yes Historical Provider, MD  Tetrahydroz-Dextran-PEG-Povid (EYE DROPS ADVANCED RELIEF) 0.05-0.1-1-1 % SOLN Place 1 drop into both eyes daily.   Yes Historical Provider, MD  warfarin (COUMADIN) 3 MG tablet Take 3 mg by mouth every evening. Pt takes on Tuesday, Thursday, and Saturday.   Yes Historical Provider, MD  warfarin (COUMADIN) 6 MG tablet Take 6 mg by mouth every evening. Pt takes on Monday, Wednesday, Friday, and Sunday.   Yes Historical Provider, MD      VITAL SIGNS:  Blood pressure 153/87, pulse 92, temperature 97.7 F (36.5 C), temperature source Oral, resp. rate 23, height 5\' 9"  (1.753 m), weight 105.235 kg (232 lb), SpO2 98 %.  PHYSICAL EXAMINATION:  Physical Exam  GENERAL:  82 y.o.-year-old patient lying in the bed in moderate respiratory distress.  EYES: Pupils equal, round, reactive to light and accommodation. No scleral icterus.  Extraocular muscles intact.  HEENT: Head atraumatic, normocephalic. Oropharynx and nasopharynx clear. No oropharyngeal erythema, moist oral mucosa  NECK:  Supple, no jugular venous distention. No thyroid enlargement, no tenderness.  LUNGS: Diffuse wheezing bilaterally, minimal bibasilar rales, no dullness to percussion. Positive use of accessory muscles. CARDIOVASCULAR: S1, S2 irregular. No murmurs, rubs, gallops, clicks.  ABDOMEN: Soft, nontender, nondistended. Bowel sounds present. No organomegaly or mass.  EXTREMITIES: +1 pedal edema, cyanosis, or clubbing. + 2 pedal & radial pulses b/l.   NEUROLOGIC: Cranial nerves II through XII are intact. No focal Motor or sensory deficits appreciated b/l.  Globally weak PSYCHIATRIC: The patient is alert and oriented x 3. Good affect.  SKIN: No obvious rash, lesion, or ulcer.   LABORATORY PANEL:   CBC  Recent Labs Lab 08/03/15 1459  WBC 7.2  HGB 13.2  HCT 40.2  PLT 158   ------------------------------------------------------------------------------------------------------------------  Chemistries   Recent Labs Lab 08/03/15 1459  NA 139  K 3.8  CL 104  CO2 29  GLUCOSE 145*  BUN 15  CREATININE 0.84  CALCIUM 8.3*  MG 1.9  AST 19  ALT 7*  ALKPHOS 76  BILITOT 1.1   ------------------------------------------------------------------------------------------------------------------  Cardiac Enzymes  Recent Labs Lab 08/03/15 1459  TROPONINI <0.03   ------------------------------------------------------------------------------------------------------------------  RADIOLOGY:  Dg Chest Port 1 View  08/03/2015  CLINICAL DATA:  Cold x 2 weeks, shortness of breath today EXAM: PORTABLE CHEST 1 VIEW COMPARISON:  07/11/2015 FINDINGS: Severe cardiac enlargement stable. Moderate vascular congestion stable. No infiltrate or effusion. IMPRESSION: Stable cardiomegaly and vascular congestion with no acute findings Electronically Signed   By:  Esperanza Heir M.D.   On: 08/03/2015 15:05     IMPRESSION AND PLAN:   82 year old male with past medical history of hypertension, chronic afibrillation, diabetes, history of Parkinson's disease, history of previous TIA, history of CHF who presents to the hospital due to shortness of breath, cough, wheezing.  1. Acute respiratory failure with hypoxia-due to CHF and also underlying COPD. -I will diurese him with IV Lasix and follow I's and O's and daily weights. -Also place him on IV steroids, DuoNeb's on the clock, Pulmicort nebs. -Continue O2 supplementation and wean as tolerated.  2. CHF-acute on chronic systolic dysfunction. -Diurese the patient with IV Lasix, follow I's and O's and daily weights. -Continue metoprolol, lisinopril.  3. COPD exacerbation-possibly due to acute bronchitis. -We'll start the patient on IV steroids, DuoNeb's on o'clock, Pulmicort nebs. -I will do a rapid flu test with PCR.  4. History of Parkinson's disease-continue Sinemet.  5. History of chronic afibrillation-rate controlled. Continue metoprolol. -Continue Coumadin, INR therapeutic.   All the records are reviewed and case discussed with ED provider. Management plans discussed with the patient, family and they are in agreement.  CODE STATUS: DO NOT RESUSCITATE  TOTAL TIME TAKING CARE OF THIS PATIENT: 50 minutes.    Houston Siren M.D on 08/03/2015 at 4:50 PM  Between 7am to 6pm - Pager - [phone]  After 6pm go to [email or web address] - password [email or web address]  [place]  [facility]  Office  [phone]  CC: Primary care physician; Barbette Reichmann, MD

## 2015-08-03 NOTE — ED Provider Notes (Signed)
[facility] Emergency Department Provider Note  ____________________________________________  Time seen: Approximately 2:53 PM  I have reviewed the triage vital signs and the nursing notes.   HISTORY  Chief Complaint Shortness of Breath    HPI Troy [name] is a 82 y.o. male with a history of CHF and mitral valve replacement many years ago who is on chronic 2 L of oxygen at home who presents with about 2 weeks of viral symptoms and a week of worsening shortness of breath, wheezing, and dyspnea on exertion.  It is accelerated rapidly over the last 2 days and now he is in moderate respiratory distress.  He complains of mild aching chest pain while trying to breathe.  He is using accessory muscles in his wheezing and gurgling audibly at rest.  The symptoms are much worse when trying to lie flat to the point he does not even attempt to do so.  He has had no nausea, vomiting, diarrhea, abdominal pain, fever/chills, dysuria.  His primary care doctor is Dr. Marcello FennelHande whom he saw on Wednesday and had a "normal EKG".  His family spoke with Dr. Marcello FennelHande by phone yesterday and was told to increase his Lasix dose to 40 mg by mouth daily (he typically alternates daily doses between 20 mg one day and 40 mg the next).  However, he has only had 1 day of 40 mg of Lasix and his symptoms are continued to get worse.  They are currently severe and nothing is making them better.  He has also been prescribed albuterol nebulizers but they have not made a difference.  He has no history of COPD and his last use of tobacco was more than 40 years ago.   Past Medical History  Diagnosis Date  . Diabetes mellitus without complication (HCC)   . Hypertension   . UTI (lower urinary tract infection)   . Parkinson's disease (HCC)   . TIA (transient ischemic attack)   . Atrial fibrillation (HCC)   . CHF (congestive heart failure) (HCC)   . Stroke (HCC)   . Hypothyroidism     Patient Active Problem  List   Diagnosis Date Noted  . Lower extremity weakness 02/25/2015    Past Surgical History  Procedure Laterality Date  . Cardiac surgery    . Cholecystectomy      Current Outpatient Rx  Name  Route  Sig  Dispense  Refill  . albuterol (PROVENTIL HFA) 108 (90 Base) MCG/ACT inhaler   Inhalation   Inhale 2 puffs into the lungs every 4 (four) hours as needed for wheezing or shortness of breath.   1 Inhaler   0   . carbidopa-levodopa (SINEMET IR) 25-250 MG tablet   Oral   Take 1 tablet by mouth 3 (three) times daily.         . cetirizine (ZYRTEC) 10 MG tablet   Oral   Take 10 mg by mouth daily.         . furosemide (LASIX) 20 MG tablet   Oral   Take 20 mg by mouth daily.         [name]. glipiZIDE (GLUCOTROL) 10 MG tablet   Oral   Take 5-10 mg by mouth 2 (two) times daily. Pt takes one tablet in the morning and one-half tablet in the evening.         . insulin regular human CONCENTRATED (HUMULIN R) 500 UNIT/ML injection   Subcutaneous   Inject 4-6 Units into the skin 2 (two) times  daily with a meal. Pt uses four units with breakfast and six units with dinner.         [name] lisinopril (PRINIVIL,ZESTRIL) 40 MG tablet   Oral   Take 40 mg by mouth at bedtime.          . metoprolol succinate (TOPROL-XL) 50 MG 24 hr tablet   Oral   Take 50 mg by mouth every evening. Take with or immediately following a meal.         . Multiple Vitamins-Minerals (PRESERVISION AREDS 2) CAPS   Oral   Take 1 capsule by mouth 2 (two) times daily.         Bertram Gala Glycol-Propyl Glycol (SYSTANE OP)   Both Eyes   Place 1 drop into both eyes 2 (two) times daily.         . predniSONE (DELTASONE) 20 MG tablet   Oral   Take 2 tablets (40 mg total) by mouth daily.   8 tablet   0   . saxagliptin HCl (ONGLYZA) 2.5 MG TABS tablet   Oral   Take 5 mg by mouth daily.         [name] warfarin (COUMADIN) 3 MG tablet   Oral   Take 3 mg by mouth every evening. Pt takes on Tuesday, Thursday, and  Saturday.         . warfarin (COUMADIN) 6 MG tablet   Oral   Take 6 mg by mouth every evening. Pt takes on Monday, Wednesday, Friday, and Sunday.           Allergies Review of patient's allergies indicates no known allergies.  Family History  Problem Relation Age of Onset  . Hypertension Mother   . Stroke Mother   . CAD Father   . Lymphoma Sister   . Lung disease Brother     Social History Social History  Substance Use Topics  . Smoking status: Former Games developer  . Smokeless tobacco: Never Used  . Alcohol Use: No    Review of Systems Constitutional: No fever/chills Eyes: No visual changes. ENT: No sore throat. Cardiovascular: mild aching CP w/ breathin Respiratory: severe SOB and orthopnea Gastrointestinal: No abdominal pain.  No nausea, no vomiting.  No diarrhea.  No constipation. Genitourinary: Negative for dysuria. Musculoskeletal: Negative for back pain. Skin: Negative for rash. Neurological: Negative for headaches, focal weakness or numbness.  10-point ROS otherwise negative.  ____________________________________________   PHYSICAL EXAM:  VITAL SIGNS: ED Triage Vitals  Enc Vitals Group     BP 08/03/15 1429 153/87 mmHg     Pulse Rate 08/03/15 1429 92     Resp 08/03/15 1429 23     Temp 08/03/15 1429 97.7 F (36.5 C)     Temp Source 08/03/15 1429 Oral     SpO2 08/03/15 1426 88 %     Weight 08/03/15 1429 232 lb (105.235 kg)     Height 08/03/15 1429  (1.753 m)     Head Cir --      Peak Flow --      Pain Score 08/03/15 1433 0     Pain Loc --      Pain Edu? --      Excl. in GC? --     Constitutional: Alert and oriented. Moderate respiratory distress at rest Eyes: Conjunctivae are normal. PERRL. EOMI. Head: Atraumatic. Nose: No congestion/rhinnorhea. Mouth/Throat: Mucous membranes are moist.  Oropharynx non-erythematous. Neck: No stridor.  No meningeal signs.   Cardiovascular: Borderline tachycardia, regular rhythm.  Good peripheral  circulation. Grossly normal heart sounds.   Respiratory: Increased respiratory effort with audible "wet" breath sounds without stethoscope.  Use of accessory muscles / retractions.  Wet, coarse breath sounds in bases Gastrointestinal: Soft and nontender. Mild distention.  Musculoskeletal: No lower extremity tenderness. Trace pitting edema in LEs.  No gross deformities of extremities. Neurologic:  Normal speech and language. No gross focal neurologic deficits are appreciated.  Skin:  Skin is warm, dry and intact. No rash noted. Psychiatric: Mood and affect are normal. Speech and behavior are normal.  ____________________________________________   LABS (all labs ordered are listed, but only abnormal results are displayed)  Labs Reviewed  CBC WITH DIFFERENTIAL/PLATELET - Abnormal; Notable for the following:    RDW 15.1 (*)    All other components within normal limits  BASIC METABOLIC PANEL - Abnormal; Notable for the following:    Glucose, Bld 145 (*)    Calcium 8.3 (*)    All other components within normal limits  HEPATIC FUNCTION PANEL - Abnormal; Notable for the following:    Albumin 3.4 (*)    ALT 7 (*)    All other components within normal limits  PROTIME-INR - Abnormal; Notable for the following:    Prothrombin Time 35.4 (*)    All other components within normal limits  APTT - Abnormal; Notable for the following:    aPTT 47 (*)    All other components within normal limits  BRAIN NATRIURETIC PEPTIDE  TROPONIN I  MAGNESIUM  URINALYSIS COMPLETEWITH MICROSCOPIC ([facility] ONLY)   ____________________________________________  EKG  ED ECG REPORT I, Beth Spackman, the attending physician, personally viewed and interpreted this ECG.   Date: 08/03/2015  EKG Time: 15:07  Rate: 86  Rhythm: atrial fibrillation, rate 86  Axis: Normal  Intervals:Show fibrillation results in regular intervals, generally unremarkable except for QTc of 502  ST&T Change: Non-specific ST segment / T-wave  changes, but no evidence of acute ischemia.  ____________________________________________  RADIOLOGY   Dg Chest Port 1 View  08/03/2015  CLINICAL DATA:  Cold x 2 weeks, shortness of breath today EXAM: PORTABLE CHEST 1 VIEW COMPARISON:  07/11/2015 FINDINGS: Severe cardiac enlargement stable. Moderate vascular congestion stable. No infiltrate or effusion. IMPRESSION: Stable cardiomegaly and vascular congestion with no acute findings Electronically Signed   By: Esperanza Heir M.D.   On: 08/03/2015 15:05    ____________________________________________   PROCEDURES  Procedure(s) performed: None  Critical Care performed: No ____________________________________________   INITIAL IMPRESSION / ASSESSMENT AND PLAN / ED COURSE  Pertinent labs & imaging results that were available during my care of the patient were reviewed by me and considered in my medical decision making (see chart for details).  Moderate respiratory distress with signs and symptoms and history most consistent with acute CHF exacerbation.  I do not believe that a nebulizer would be helpful as he does not have sequela of COPD.  The patient was hypoxemic on his usual 2 L at 88% is requiring higher flow oxygen and is still retracting and has tachypnea.  I will await his chest x-ray but I anticipate IV Lasix, nitroglycerin paste, and admission for CHF exacerbation.  ----------------------------------------- 4:18 PM on 08/03/2015 -----------------------------------------  Chest x-ray with pulmonary vascular congestion.  Clinically he very much looks and sounds like a CHF exacerbation.  I believe his low BNP is just because it is a relatively early presentation of the acute exacerbation.  I treated him with Lasix 40 mg IV and placed 1 inch of nitroglycerin  paste.  I am giving him a DuoNeb to see if this will help a little bit with his work of breathing.  He remains alert and oriented, tachypnea, with some retractions, but I do not  believe he needs the ICU at this time.  Spoke with hospitalist and updated patient/family.  ____________________________________________  FINAL CLINICAL IMPRESSION(S) / ED DIAGNOSES  Final diagnoses:  Acute congestive heart failure, unspecified congestive heart failure type (HCC)  Dyspnea  Chronic atrial fibrillation (HCC)  Warfarin anticoagulation      NEW MEDICATIONS STARTED DURING THIS VISIT:  New Prescriptions   No medications on file      Note:  This document was prepared using Dragon voice recognition software and may include unintentional dictation errors.   Loleta [name], MD 08/03/15 [phone]

## 2015-08-03 NOTE — Progress Notes (Signed)
ANTICOAGULATION CONSULT NOTE - Initial Consult  Pharmacy Consult for Warfarin  Indication: atrial fibrillation  No Known Allergies  Patient Measurements: Height: 5\' 9"  (175.3 cm) Weight: 232 lb (105.235 kg) IBW/kg (Calculated) : 70.7  Vital Signs: Temp: 97.7 F (36.5 C) (03/31 1429) Temp Source: Oral (03/31 1429) BP: 153/87 mmHg (03/31 1429) Pulse Rate: 92 (03/31 1429)  Labs:  Recent Labs  08/03/15 1459  HGB 13.2  HCT 40.2  PLT 158  APTT 47*  LABPROT 35.4*  INR 3.64  CREATININE 0.84  TROPONINI <0.03    Estimated Creatinine Clearance: 69.9 mL/min (by C-G formula based on Cr of 0.84).   Medical History: Past Medical History  Diagnosis Date  . Diabetes mellitus without complication (HCC)   . Hypertension   . UTI (lower urinary tract infection)   . Parkinson's disease (HCC)   . TIA (transient ischemic attack)   . Atrial fibrillation (HCC)   . CHF (congestive heart failure) (HCC)   . Stroke (HCC)   . Hypothyroidism     Assessment: 82 yo male brought to hospital due to SOB and wheezing. Pharmacy has been consulted to monitor/manage patients warfarin for his chronic A. Fib.  Patients home dose:  Warfarin 3mg  T, Th, Sat  Warfarin 6mg  M,W,F, Sun   3/31 INR on admission: 3.64  Goal of Therapy:  INR 2-3 Monitor platelets by anticoagulation protocol: Yes   Plan:  INR is supratherapeutic at 3.64. Will hold warfarin today. INR ordered for 0500 tomorrow. Pharmacy will continue to monitor INR and resume warfarin when INR gets closer to goal.   Cher NakaiSheema Branon Sabine, PharmD Pharmacy Resident  08/03/2015,5:07 PM

## 2015-08-04 LAB — GLUCOSE, CAPILLARY
GLUCOSE-CAPILLARY: 331 mg/dL — AB (ref 65–99)
GLUCOSE-CAPILLARY: 340 mg/dL — AB (ref 65–99)
Glucose-Capillary: 338 mg/dL — ABNORMAL HIGH (ref 65–99)
Glucose-Capillary: 305 mg/dL — ABNORMAL HIGH (ref 65–99)
Glucose-Capillary: 289 mg/dL — ABNORMAL HIGH (ref 65–99)

## 2015-08-04 LAB — CBC
HEMATOCRIT: 38.5 % — AB (ref 40.0–52.0)
Hemoglobin: 12.9 g/dL — ABNORMAL LOW (ref 13.0–18.0)
MCH: 30.2 pg (ref 26.0–34.0)
MCHC: 33.4 g/dL (ref 32.0–36.0)
MCV: 90.4 fL (ref 80.0–100.0)
Platelets: 150 10*3/uL (ref 150–440)
RBC: 4.26 MIL/uL — ABNORMAL LOW (ref 4.40–5.90)
RDW: 15 % — AB (ref 11.5–14.5)
WBC: 7.1 10*3/uL (ref 3.8–10.6)

## 2015-08-04 LAB — BASIC METABOLIC PANEL
Anion gap: 7 (ref 5–15)
BUN: 19 mg/dL (ref 6–20)
CO2: 30 mmol/L (ref 22–32)
Calcium: 8.2 mg/dL — ABNORMAL LOW (ref 8.9–10.3)
Chloride: 100 mmol/L — ABNORMAL LOW (ref 101–111)
Creatinine, Ser: 1.12 mg/dL (ref 0.61–1.24)
GFR calc Af Amer: >60 mL/min (ref >60)
GFR, EST NON AFRICAN AMERICAN: 56 mL/min — AB (ref >60)
GLUCOSE: 332 mg/dL — AB (ref 65–99)
POTASSIUM: 4.2 mmol/L (ref 3.5–5.1)
Sodium: 137 mmol/L (ref 135–145)

## 2015-08-04 LAB — URINALYSIS COMPLETE WITH MICROSCOPIC (ARMC ONLY)
BACTERIA UA: NONE SEEN
Bilirubin Urine: NEGATIVE
Glucose, UA: >500 mg/dL — AB
Hgb urine dipstick: NEGATIVE
Leukocytes, UA: NEGATIVE
Nitrite: NEGATIVE
PROTEIN: 30 mg/dL — AB
Specific Gravity, Urine: 1.015 (ref 1.005–1.030)
pH: 5 (ref 5.0–8.0)

## 2015-08-04 LAB — PROTIME-INR
INR: 3.53
Prothrombin Time: 34.6 seconds — ABNORMAL HIGH (ref 11.4–15.0)

## 2015-08-04 LAB — TROPONIN I: Troponin I: <0.03 ng/mL (ref <0.031)

## 2015-08-04 MED ORDER — CETYLPYRIDINIUM CHLORIDE 0.05 % MT LIQD
7.0000 mL | Freq: Two times a day (BID) | OROMUCOSAL | Status: DC
  Administered 2015-08-04 – 2015-08-07 (×5): 7 mL via OROMUCOSAL

## 2015-08-04 MED ORDER — INSULIN ASPART 100 UNIT/ML ~~LOC~~ SOLN: 0.0000 [IU] | Freq: Every day | SUBCUTANEOUS | Status: DC

## 2015-08-04 MED ORDER — METHYLPREDNISOLONE SODIUM SUCC 40 MG IJ SOLR
40.0000 mg | Freq: Two times a day (BID) | INTRAMUSCULAR | Status: DC
  Administered 2015-08-04: 40 mg via INTRAVENOUS
  Filled 2015-08-04: qty 1

## 2015-08-04 NOTE — Progress Notes (Signed)
Pt. admitted to unit, [id number] from ED, report from [place], [place]. Oriented to room, call bell, Ascom phones and staff. Bed in low position. Fall safety plan reviewed, contract signed and placed on wall, yellow non-skid socks in place, bed alarm on. Full assessment to Epic; skin assessed with Greer EeSusan Presto, RN. Telemetry box verified with tele clerk and Ladona Ridgelaylor B. RN: [id number] . Will continue to monitor.

## 2015-08-04 NOTE — Plan of Care (Signed)
Problem: Safety: Goal: Ability to remain free from injury will improve Outcome: Progressing Patient high falls risk, yellow non-skid socks in place, pt verbalizes he will call when he needs to get oob.   Problem: Fluid Volume: Goal: Ability to maintain a balanced intake and output will improve Outcome: Progressing Patient on IV lasix.

## 2015-08-04 NOTE — Progress Notes (Signed)
[facility] [place] [facility]   PATIENT NAME: Troy [name]    MR#:  [id number]  DATE OF BIRTH:  [birth year]  SUBJECTIVE:  CHIEF COMPLAINT:   Chief Complaint  Patient presents with  . Shortness of Breath  better cough and shortness of breath. On oxygen by nasal cannula 3 L.  REVIEW OF SYSTEMS:  CONSTITUTIONAL: No fever, has generalized weakness.  EYES: No blurred or double vision.  EARS, NOSE, AND THROAT: No tinnitus or ear pain.  RESPIRATORY: Has cough, shortness of breath, no wheezing or hemoptysis.  CARDIOVASCULAR: No chest pain, orthopnea, edema.  GASTROINTESTINAL: No nausea, vomiting, diarrhea or abdominal pain.  GENITOURINARY: No dysuria, hematuria.  ENDOCRINE: No polyuria, nocturia,  HEMATOLOGY: No anemia, easy bruising or bleeding SKIN: No rash or lesion. MUSCULOSKELETAL: No joint pain or arthritis.   NEUROLOGIC: No tingling, numbness, weakness.  PSYCHIATRY: No anxiety or depression.   DRUG ALLERGIES:  No Known Allergies  VITALS:  Blood pressure 132/66, pulse 95, temperature 97.7 F (36.5 C), temperature source Oral, resp. rate 20, height 5\' 9"  (1.753 m), weight 105.235 kg (232 lb), SpO2 94 %.  PHYSICAL EXAMINATION:  GENERAL:  82 y.o.-year-old patient lying in the bed with no acute distress.  EYES: Pupils equal, round, reactive to light and accommodation. No scleral icterus. Extraocular muscles intact.  HEENT: Head atraumatic, normocephalic. Oropharynx and nasopharynx clear.  NECK:  Supple, no jugular venous distention. No thyroid enlargement, no tenderness.  LUNGS: Normal breath sounds bilaterally, no wheezing, but has bilateral rales. No use of accessory muscles of respiration.  CARDIOVASCULAR: S1, S2 normal. No murmurs, rubs, or gallops.  ABDOMEN: Soft, nontender, nondistended. Bowel sounds present. No organomegaly or mass.  EXTREMITIES: No pedal edema, cyanosis, or clubbing.  NEUROLOGIC: Cranial nerves II through XII are intact.  Muscle strength 4/5 in all extremities. Sensation intact. Gait not checked.  PSYCHIATRIC: The patient is alert and oriented x 3.  SKIN: No obvious rash, lesion, or ulcer.    LABORATORY PANEL:   CBC  Recent Labs Lab 08/04/15 0458  WBC 7.1  HGB 12.9*  HCT 38.5*  PLT 150   ------------------------------------------------------------------------------------------------------------------  Chemistries   Recent Labs Lab 08/03/15 1459 08/04/15 0458  NA 139 137  K 3.8 4.2  CL 104 100*  CO2 29 30  GLUCOSE 145* 332*  BUN 15 19  CREATININE 0.84 1.12  CALCIUM 8.3* 8.2*  MG 1.9  --   AST 19  --   ALT 7*  --   ALKPHOS 76  --   BILITOT 1.1  --    ------------------------------------------------------------------------------------------------------------------  Cardiac Enzymes  Recent Labs Lab 08/04/15 0403  TROPONINI <0.03   ------------------------------------------------------------------------------------------------------------------  RADIOLOGY:  Dg Chest Port 1 View  08/03/2015  CLINICAL DATA:  Cold x 2 weeks, shortness of breath today EXAM: PORTABLE CHEST 1 VIEW COMPARISON:  07/11/2015 FINDINGS: Severe cardiac enlargement stable. Moderate vascular congestion stable. No infiltrate or effusion. IMPRESSION: Stable cardiomegaly and vascular congestion with no acute findings Electronically Signed   By: Esperanza Heiraymond  Rubner M.D.   On: 08/03/2015 15:05    EKG:   Orders placed or performed during the hospital encounter of 08/03/15  . ED EKG  . ED EKG  . EKG 12-Lead  . EKG 12-Lead    ASSESSMENT AND PLAN:   82 year old male with past medical history of hypertension, chronic afibrillation, diabetes, history of Parkinson's disease, history of previous TIA, history of CHF who presents to the hospital due to shortness of breath, cough, wheezing.  1. Acute  on chronic respiratory failure with hypoxia-due to CHF and also underlying COPD. Continue  IV Lasix and follow I's and O's  and daily weights. Taper  IV steroids,  continue DuoNeb's on the clock, Pulmicort nebs. -Continue O2 supplementation and wean as tolerated.  2. CHF-acute on chronic systolic dysfunction. Continue  IV Lasix, follow I's and O's and daily weights. -Continue metoprolol, lisinopril.  3. COPD exacerbation-possibly due to acute bronchitis. Taper  IV steroids,  continue DuoNeb's on o'clock, Pulmicort nebs. Robitussin when necessary  4. History of Parkinson's disease-continue Sinemet.  5. History of chronic afibrillation-rate controlled. Continue metoprolol.  Coumadin dose per pharmacy. Hold Coumadin due to elevated INR 3.54.   * Diabetes. Start a sliding scale.   Weakness. PT evaluation.  All the records are reviewed and case discussed with Care Management/Social Workerr. Management plans discussed with the patient, his wife and they are in agreement.  CODE STATUS: DNR.  TOTAL TIME TAKING CARE OF THIS PATIENT: 42 minutes.  Greater than 50% time was spent on coordination of care and face-to-face counseling.  POSSIBLE D/C IN 3 DAYS, DEPENDING ON CLINICAL CONDITION.   Shaune Pollack M.D on 08/04/2015 at 1:48 PM  Between 7am to 6pm - Pager - [phone]  After 6pm go to [email or web address] - password [email or web address] [facility]  [facility]  Office  [phone]  CC: Primary care physician; Barbette Reichmann, MD

## 2015-08-04 NOTE — ED Notes (Signed)
Pt soiled diaper. Changed diaper and bed linen. Pending bed assignment. Waiting for medications from pharmacy at this time. Will administer when received.

## 2015-08-04 NOTE — ED Notes (Signed)
Replaced pt's brief and cleaned/dried pt.

## 2015-08-04 NOTE — Evaluation (Signed)
Physical Therapy Evaluation Patient Details Name: Troy BurnRobert [name] MRN: [id number] DOB: February 18, 1924 Today's Date: 08/04/2015   History of Present Illness  Pt has had ~1 week of SOB and fatigue.  He is feeling better now on 3 liters (2 liters at baseline).  Pt has h/o Parkinsons, CVA, COPD, CHF.  Clinical Impression  Pt is able to ambulate well with QC and though he has some fatigue on 3 liters his sats remain in the 90s.  He shows consistent, moderate speed ambulation with no significant safety issues. He and wife agree that he is not quite at his baseline, but that if he keeps his normal routine with caregiver that he will not likely need PT at home.      Follow Up Recommendations No PT follow up    Equipment Recommendations       Recommendations for Other Services       Precautions / Restrictions Precautions Precautions: Fall Restrictions Weight Bearing Restrictions: No      Mobility  Bed Mobility               General bed mobility comments: pt sitting at EOB on arrival  Transfers Overall transfer level: Independent Equipment used: Human resources officerQuad cane             General transfer comment: Pt able to rise w/o assist and shows good safety and though he is somewhat slow moving shows good confidence  Ambulation/Gait Ambulation/Gait assistance: Supervision Ambulation Distance (Feet): 200 Feet Assistive device: Quad cane       General Gait Details: Pt able to walk with slow but consistent and age appropriate gait using the quad cane.  He has no LOBs, has only minimal fatigue and on 3 liters his O2 remains in the 90s with increased HR to ~100 with the effort.    Stairs            Wheelchair Mobility    Modified Rankin (Stroke Patients Only)       Balance                                             Pertinent Vitals/Pain Pain Assessment: No/denies pain    Home Living Family/patient expects to be discharged to:: Private residence Living  Arrangements: Spouse/significant other Available Help at Discharge: Family;Personal care attendant;Available 24 hours/day Type of Home: House Home Access: Stairs to enter Entrance Stairs-Rails: Can reach both Entrance Stairs-Number of Steps: 3 Home Layout: One level        Prior Function Level of Independence: Independent with assistive device(s)         Comments: Pt ambulates in the home with caregiver nearly every day.  He is able to go to church weekly and occasionally go out to eat (uses walker out of the house)     Hand Dominance        Extremity/Trunk Assessment   Upper Extremity Assessment: Overall WFL for tasks assessed (appears equal bilaterally)           Lower Extremity Assessment: Overall WFL for tasks assessed         Communication   Communication: No difficulties  Cognition Arousal/Alertness: Awake/alert Behavior During Therapy: WFL for tasks assessed/performed Overall Cognitive Status:  (pt does have some confusion, at baseline )  General Comments      Exercises        Assessment/Plan    PT Assessment Patient needs continued PT services  PT Diagnosis Difficulty walking;Generalized weakness   PT Problem List Decreased activity tolerance;Decreased balance;Cardiopulmonary status limiting activity  PT Treatment Interventions Gait training;Stair training;Therapeutic activities;Therapeutic exercise;Balance training;Functional mobility training   PT Goals (Current goals can be found in the Care Plan section) Acute Rehab PT Goals Patient Stated Goal: Go home PT Goal Formulation: With patient/family Time For Goal Achievement: 08/18/15 Potential to Achieve Goals: Good    Frequency Min 2X/week   Barriers to discharge        Co-evaluation               End of Session Equipment Utilized During Treatment: Gait belt;Oxygen (3 liters) Activity Tolerance: Patient tolerated treatment well Patient left: with bed  alarm set;with call bell/phone within reach;with family/visitor present           Time: 8119-1478 PT Time Calculation (min) (ACUTE ONLY): 15 min   Charges:   PT Evaluation $PT Eval Low Complexity: 1 Procedure     PT G Codes:       [name], PT, DPT [phone]  Malachi Pro 08/04/2015, 5:21 PM

## 2015-08-04 NOTE — Progress Notes (Signed)
ANTICOAGULATION CONSULT NOTE - Initial Consult  Pharmacy Consult for Warfarin  Indication: atrial fibrillation  No Known Allergies  Patient Measurements: Height: 5\' 9"  (175.3 cm) Weight: 232 lb (105.235 kg) IBW/kg (Calculated) : 70.7  Vital Signs: BP: 117/61 mmHg (04/01 0830) Pulse Rate: 78 (04/01 0830)  Labs:  Recent Labs  08/03/15 1459 08/03/15 2307 08/04/15 0120 08/04/15 0403 08/04/15 0458  HGB 13.2  --   --   --  12.9*  HCT 40.2  --   --   --  38.5*  PLT 158  --   --   --  150  APTT 47*  --   --   --   --   LABPROT 35.4*  --   --   --  34.6*  INR 3.64  --   --   --  3.53  CREATININE 0.84  --   --   --  1.12  TROPONINI <0.03 <0.03 <0.03 <0.03  --     Estimated Creatinine Clearance: 52.4 mL/min (by C-G formula based on Cr of 1.12).   Medical History: Past Medical History  Diagnosis Date  . Diabetes mellitus without complication (HCC)   . Hypertension   . UTI (lower urinary tract infection)   . Parkinson's disease (HCC)   . TIA (transient ischemic attack)   . Atrial fibrillation (HCC)   . CHF (congestive heart failure) (HCC)   . Stroke (HCC)   . Hypothyroidism     Assessment: 82 yo male brought to hospital due to SOB and wheezing. Pharmacy has been consulted to monitor/manage patients warfarin for his chronic A. Fib.  Patients home dose:  Warfarin 3mg  T, Th, Sat  Warfarin 6mg  M,W,F, Sun   3/31 INR on admission: 3.64; held  4/1: INR : 3.53   Goal of Therapy:  INR 2-3 Monitor platelets by anticoagulation protocol: Yes   Plan:  INR is supratherapeutic at 3.53. Will hold warfarin therapy for today and will reassess redosing upon reviewing PT/INR results on 4/2.  Pharmacy to follow and dose per consult.   Demetrius Charityeldrin D. Padraig Nhan, PharmD, BCPS Clinical Pharmacist   08/04/2015,9:31 AM

## 2015-08-04 NOTE — ED Notes (Signed)
Pt changed into dry depends and pt cleaned. Pt able to stand and bear weight for 5 minutes while sheets were changed. Pt had urinated but no sign of BM at this time.

## 2015-08-05 LAB — GLUCOSE, CAPILLARY
GLUCOSE-CAPILLARY: 371 mg/dL — AB (ref 65–99)
GLUCOSE-CAPILLARY: 336 mg/dL — AB (ref 65–99)
Glucose-Capillary: 235 mg/dL — ABNORMAL HIGH (ref 65–99)
Glucose-Capillary: 178 mg/dL — ABNORMAL HIGH (ref 65–99)

## 2015-08-05 LAB — CBC
HEMATOCRIT: 38 % — AB (ref 40.0–52.0)
Hemoglobin: 12.5 g/dL — ABNORMAL LOW (ref 13.0–18.0)
MCH: 29.4 pg (ref 26.0–34.0)
MCHC: 33 g/dL (ref 32.0–36.0)
MCV: 89.1 fL (ref 80.0–100.0)
PLATELETS: 166 10*3/uL (ref 150–440)
RBC: 4.26 MIL/uL — AB (ref 4.40–5.90)
RDW: 14.9 % — ABNORMAL HIGH (ref 11.5–14.5)
WBC: 11.3 10*3/uL — ABNORMAL HIGH (ref 3.8–10.6)

## 2015-08-05 LAB — BASIC METABOLIC PANEL
Anion gap: 7 (ref 5–15)
BUN: 41 mg/dL — ABNORMAL HIGH (ref 6–20)
CHLORIDE: 99 mmol/L — AB (ref 101–111)
CO2: 27 mmol/L (ref 22–32)
CREATININE: 1.33 mg/dL — AB (ref 0.61–1.24)
Calcium: 8.4 mg/dL — ABNORMAL LOW (ref 8.9–10.3)
GFR calc non Af Amer: 45 mL/min — ABNORMAL LOW (ref >60)
GFR, EST AFRICAN AMERICAN: 53 mL/min — AB (ref >60)
Glucose, Bld: 348 mg/dL — ABNORMAL HIGH (ref 65–99)
POTASSIUM: 4.6 mmol/L (ref 3.5–5.1)
Sodium: 133 mmol/L — ABNORMAL LOW (ref 135–145)

## 2015-08-05 LAB — PROTIME-INR
INR: 3.45
Prothrombin Time: 34 seconds — ABNORMAL HIGH (ref 11.4–15.0)

## 2015-08-05 LAB — MAGNESIUM: Magnesium: 1.8 mg/dL (ref 1.7–2.4)

## 2015-08-05 MED ORDER — INSULIN GLARGINE 100 UNIT/ML ~~LOC~~ SOLN
15.0000 [IU] | Freq: Every day | SUBCUTANEOUS | Status: DC
  Administered 2015-08-05 – 2015-08-07 (×3): 15 [IU] via SUBCUTANEOUS
  Filled 2015-08-05 (×4): qty 0.15

## 2015-08-05 MED ORDER — FUROSEMIDE 10 MG/ML IJ SOLN
20.0000 mg | Freq: Two times a day (BID) | INTRAMUSCULAR | Status: DC
  Administered 2015-08-05 – 2015-08-07 (×5): 20 mg via INTRAVENOUS
  Filled 2015-08-05 (×5): qty 2

## 2015-08-05 MED ORDER — PREDNISONE 20 MG PO TABS
40.0000 mg | ORAL_TABLET | Freq: Every day | ORAL | Status: DC
  Administered 2015-08-06 – 2015-08-07 (×2): 40 mg via ORAL
  Filled 2015-08-05 (×2): qty 2

## 2015-08-05 NOTE — Progress Notes (Signed)
ANTICOAGULATION CONSULT NOTE - FOLLOW UP   Pharmacy Consult for Warfarin  Indication: atrial fibrillation  No Known Allergies  Patient Measurements: Height: 5\' 9"  (175.3 cm) Weight: 230 lb 1.6 oz (104.373 kg) IBW/kg (Calculated) : 70.7  Vital Signs: Temp: 98.2 F (36.8 C) (04/02 0357) BP: 134/74 mmHg (04/02 0357) Pulse Rate: 74 (04/02 0357)  Labs:  Recent Labs  08/03/15 1459 08/03/15 2307 08/04/15 0120 08/04/15 0403 08/04/15 0458 08/05/15 0543  HGB 13.2  --   --   --  12.9* 12.5*  HCT 40.2  --   --   --  38.5* 38.0*  PLT 158  --   --   --  150 166  APTT 47*  --   --   --   --   --   LABPROT 35.4*  --   --   --  34.6* 34.0*  INR 3.64  --   --   --  3.53 3.45  CREATININE 0.84  --   --   --  1.12 1.33*  TROPONINI <0.03 <0.03 <0.03 <0.03  --   --     Estimated Creatinine Clearance: 44 mL/min (by C-G formula based on Cr of 1.33).   Medical History: Past Medical History  Diagnosis Date  . Diabetes mellitus without complication (HCC)   . Hypertension   . UTI (lower urinary tract infection)   . Parkinson's disease (HCC)   . TIA (transient ischemic attack)   . Atrial fibrillation (HCC)   . CHF (congestive heart failure) (HCC)   . Stroke (HCC)   . Hypothyroidism     Assessment: 82 yo male brought to hospital due to SOB and wheezing. Pharmacy has been consulted to monitor/manage patients warfarin for his chronic A. Fib.  Patients home dose:  Warfarin 3mg  T, Th, Sat  Warfarin 6mg  M,W,F, Sun   3/31 INR on admission: 3.64; held  4/1: INR : 3.53; HELD 4/2: INR= 3.45  Goal of Therapy:  INR 2-3 Monitor platelets by anticoagulation protocol: Yes   Plan:  INR is supratherapeutic at 3.53. Will hold warfarin therapy for today and will reassess redosing upon reviewing PT/INR results on 4/3.  Pharmacy to follow and dose per consult.   Demetrius Charityeldrin D. Greenleigh Kauth, PharmD, BCPS Clinical Pharmacist   08/05/2015,8:11 AM

## 2015-08-05 NOTE — Progress Notes (Signed)
Patient had 5 beats of  SVT and another 16 beats of SVT this morning. Dr. Tobi BastosPyreddy notified and he asked of patient's magnesium level which is 1.9. No new order received from Dr. Tobi BastosPyreddy but to continue to monitor. Patient is asymptomatic. Will continue to monitor.

## 2015-08-05 NOTE — Progress Notes (Signed)
[facility] - [place] at [facility]   PATIENT NAME: Troy [name]    MR#:  [id number]  DATE OF BIRTH:  May 03, 1924  SUBJECTIVE:  CHIEF COMPLAINT:   Chief Complaint  Patient presents with  . Shortness of Breath  better cough and shortness of breath. On oxygen by nasal cannula 3 L.  REVIEW OF SYSTEMS:  CONSTITUTIONAL: No fever, has generalized weakness.  EYES: No blurred or double vision.  EARS, NOSE, AND THROAT: No tinnitus or ear pain.  RESPIRATORY: Has cough, shortness of breath, no wheezing or hemoptysis.  CARDIOVASCULAR: No chest pain, orthopnea, edema.  GASTROINTESTINAL: No nausea, vomiting, diarrhea or abdominal pain.  GENITOURINARY: No dysuria, hematuria.  ENDOCRINE: No polyuria, nocturia,  HEMATOLOGY: No anemia, easy bruising or bleeding SKIN: No rash or lesion. MUSCULOSKELETAL: No joint pain or arthritis.   NEUROLOGIC: No tingling, numbness, weakness.  PSYCHIATRY: No anxiety or depression.   DRUG ALLERGIES:  No Known Allergies  VITALS:  Blood pressure 134/74, pulse 74, temperature 98.2 F (36.8 C), temperature source Oral, resp. rate 20, height  (1.753 m), weight 104.373 kg (230 lb 1.6 oz), SpO2 90 %.  PHYSICAL EXAMINATION:  GENERAL:  82 y.o.-year-old patient lying in the bed with no acute distress.  EYES: Pupils equal, round, reactive to light and accommodation. No scleral icterus. Extraocular muscles intact.  HEENT: Head atraumatic, normocephalic. Oropharynx and nasopharynx clear.  NECK:  Supple, no jugular venous distention. No thyroid enlargement, no tenderness.  LUNGS: Normal breath sounds bilaterally, no wheezing, no rales. No use of accessory muscles of respiration.  CARDIOVASCULAR: S1, S2 normal. No murmurs, rubs, or gallops.  ABDOMEN: Soft, nontender, nondistended. Bowel sounds present. No organomegaly or mass.  EXTREMITIES: No pedal edema, cyanosis, or clubbing.  NEUROLOGIC: Cranial nerves II through XII are intact. Muscle  strength 4/5 in all extremities. Sensation intact. Gait not checked.  PSYCHIATRIC: The patient is alert and oriented x 3.  SKIN: No obvious rash, lesion, or ulcer.    LABORATORY PANEL:   CBC  Recent Labs Lab 08/05/15 0543  WBC 11.3*  HGB 12.5*  HCT 38.0*  PLT 166   ------------------------------------------------------------------------------------------------------------------  Chemistries   Recent Labs Lab 08/03/15 1459  08/05/15 0543  NA 139  < > 133*  K 3.8  < > 4.6  CL 104  < > 99*  CO2 29  < > 27  GLUCOSE 145*  < > 348*  BUN 15  < > 41*  CREATININE 0.84  < > 1.33*  CALCIUM 8.3*  < > 8.4*  MG 1.9  --  1.8  AST 19  --   --   ALT 7*  --   --   ALKPHOS 76  --   --   BILITOT 1.1  --   --   < > = values in this interval not displayed. ------------------------------------------------------------------------------------------------------------------  Cardiac Enzymes  Recent Labs Lab 08/04/15 0403  TROPONINI <0.03   ------------------------------------------------------------------------------------------------------------------  RADIOLOGY:  Dg Chest Port 1 View  08/03/2015  CLINICAL DATA:  Cold x 2 weeks, shortness of breath today EXAM: PORTABLE CHEST 1 VIEW COMPARISON:  07/11/2015 FINDINGS: Severe cardiac enlargement stable. Moderate vascular congestion stable. No infiltrate or effusion. IMPRESSION: Stable cardiomegaly and vascular congestion with no acute findings Electronically Signed   By: Esperanza Heir M.D.   On: 08/03/2015 15:05    EKG:   Orders placed or performed during the hospital encounter of 08/03/15  . ED EKG  . ED EKG  . EKG 12-Lead  .  EKG 12-Lead    ASSESSMENT AND PLAN:   82 year old male with past medical history of hypertension, chronic afibrillation, diabetes, history of Parkinson's disease, history of previous TIA, history of CHF who presents to the hospital due to shortness of breath, cough, wheezing.  1. Acute  on chronic  respiratory failure with hypoxia-due to CHF and also underlying COPD. Decrease IV Lasix to 20 mg twice a day. Taper  steroids,  continue DuoNeb's on the clock, Pulmicort nebs. -Continue O2 supplementation and wean as tolerated.  2. CHF-acute on chronic systolic dysfunction. Decrease IV Lasix, follow I's and O's and daily weights. -Continue metoprolol, lisinopril.  3. COPD exacerbation-possibly due to acute bronchitis. Discontinue IV Solu-Medrol,  start prednisone, continue DuoNeb's on o'clock, Pulmicort nebs. Robitussin when necessary  4. History of Parkinson's disease-continue Sinemet.  5. History of chronic afibrillation-rate controlled. Continue metoprolol.  Coumadin dose per pharmacy. Hold Coumadin due to elevated INR 3.54.   * Diabetes, not well controlled, Continue sliding scale. Add Lantus 15 units subcutaneous daily at bedtime.  * Acute renal failure and hyponatremia. Possible due to Lasix. Decrease Lasix to 20 mg twice a day. Follow up BMP.  Weakness. PT evaluation: No PT follow-up.  All the records are reviewed and case discussed with Care Management/Social Workerr. Management plans discussed with the patient, his wife and they are in agreement.  CODE STATUS: DNR.  TOTAL TIME TAKING CARE OF THIS PATIENT: 42 minutes.  Greater than 50% time was spent on coordination of care and face-to-face counseling.  POSSIBLE D/C IN 3 DAYS, DEPENDING ON CLINICAL CONDITION.   Shaune Pollackhen, Mervyn Pflaum M.D on 08/05/2015 at 10:33 AM  Between 7am to 6pm - Pager - [phone]  After 6pm go to [email or web address] - password [email or web address] [facility]  [place] [place] [facility]  Office  [phone]  CC: Primary care physician; Barbette ReichmannHANDE,VISHWANATH, MD

## 2015-08-06 ENCOUNTER — Encounter: Payer: Self-pay | Admitting: Physician Assistant

## 2015-08-06 DIAGNOSIS — I482 Chronic atrial fibrillation: Secondary | ICD-10-CM

## 2015-08-06 DIAGNOSIS — I5022 Chronic systolic (congestive) heart failure: Secondary | ICD-10-CM

## 2015-08-06 DIAGNOSIS — R001 Bradycardia, unspecified: Secondary | ICD-10-CM

## 2015-08-06 DIAGNOSIS — J9620 Acute and chronic respiratory failure, unspecified whether with hypoxia or hypercapnia: Secondary | ICD-10-CM

## 2015-08-06 LAB — BASIC METABOLIC PANEL
Anion gap: 7 (ref 5–15)
BUN: 39 mg/dL — ABNORMAL HIGH (ref 6–20)
CALCIUM: 8.5 mg/dL — AB (ref 8.9–10.3)
CO2: 32 mmol/L (ref 22–32)
CREATININE: 1.05 mg/dL (ref 0.61–1.24)
Chloride: 99 mmol/L — ABNORMAL LOW (ref 101–111)
GFR calc non Af Amer: >60 mL/min (ref >60)
Glucose, Bld: 192 mg/dL — ABNORMAL HIGH (ref 65–99)
Potassium: 3.5 mmol/L (ref 3.5–5.1)
SODIUM: 138 mmol/L (ref 135–145)

## 2015-08-06 LAB — GLUCOSE, CAPILLARY
GLUCOSE-CAPILLARY: 186 mg/dL — AB (ref 65–99)
GLUCOSE-CAPILLARY: 232 mg/dL — AB (ref 65–99)
GLUCOSE-CAPILLARY: 240 mg/dL — AB (ref 65–99)
Glucose-Capillary: 264 mg/dL — ABNORMAL HIGH (ref 65–99)
Glucose-Capillary: 272 mg/dL — ABNORMAL HIGH (ref 65–99)

## 2015-08-06 LAB — PROTIME-INR
INR: 1.92
Prothrombin Time: 21.9 seconds — ABNORMAL HIGH (ref 11.4–15.0)

## 2015-08-06 LAB — TSH: TSH: 2.664 u[IU]/mL (ref 0.350–4.500)

## 2015-08-06 LAB — MAGNESIUM: MAGNESIUM: 2 mg/dL (ref 1.7–2.4)

## 2015-08-06 MED ORDER — METOPROLOL SUCCINATE ER 25 MG PO TB24: 25.0000 mg | ORAL_TABLET | Freq: Every evening | ORAL | Status: DC

## 2015-08-06 MED ORDER — WARFARIN SODIUM 5 MG PO TABS
6.0000 mg | ORAL_TABLET | Freq: Once | ORAL | Status: AC
  Administered 2015-08-06: 6 mg via ORAL
  Filled 2015-08-06: qty 1

## 2015-08-06 MED ORDER — SENNOSIDES-DOCUSATE SODIUM 8.6-50 MG PO TABS
1.0000 | ORAL_TABLET | Freq: Two times a day (BID) | ORAL | Status: DC
  Administered 2015-08-06 – 2015-08-07 (×2): 1 via ORAL
  Filled 2015-08-06 (×2): qty 1

## 2015-08-06 MED ORDER — POLYETHYLENE GLYCOL 3350 17 G PO PACK
17.0000 g | PACK | Freq: Every day | ORAL | Status: DC
  Administered 2015-08-06 – 2015-08-07 (×2): 17 g via ORAL
  Filled 2015-08-06 (×2): qty 1

## 2015-08-06 MED ORDER — HEPARIN (PORCINE) IN NACL 100-0.45 UNIT/ML-% IJ SOLN
1700.0000 [IU]/h | INTRAMUSCULAR | Status: DC
  Administered 2015-08-06: 1350 [IU]/h via INTRAVENOUS
  Filled 2015-08-06 (×2): qty 250

## 2015-08-06 NOTE — Progress Notes (Signed)
[facility] - [place] at [facility]   PATIENT NAME: Troy [name]    MR#:  [id number]  DATE OF BIRTH:  1923-09-25  SUBJECTIVE:  CHIEF COMPLAINT:   Chief Complaint  Patient presents with  . Shortness of Breath  was doing well and planned for D/C but then his HR dropped in 20-30s working with PT, also had 9 sec pause so D/C cancelled  REVIEW OF SYSTEMS:  CONSTITUTIONAL: No fever, has generalized weakness.  EYES: No blurred or double vision.  EARS, NOSE, AND THROAT: No tinnitus or ear pain.  RESPIRATORY: Has cough, shortness of breath, no wheezing or hemoptysis.  CARDIOVASCULAR: No chest pain, orthopnea, edema.  GASTROINTESTINAL: No nausea, vomiting, diarrhea or abdominal pain.  GENITOURINARY: No dysuria, hematuria.  ENDOCRINE: No polyuria, nocturia,  HEMATOLOGY: No anemia, easy bruising or bleeding SKIN: No rash or lesion. MUSCULOSKELETAL: No joint pain or arthritis.   NEUROLOGIC: No tingling, numbness, weakness.  PSYCHIATRY: No anxiety or depression.   DRUG ALLERGIES:  No Known Allergies  VITALS:  Blood pressure 145/75, pulse 79, temperature 97.7 F (36.5 C), temperature source Oral, resp. rate 16, height 5\' 9"  (1.753 m), weight 104.373 kg (230 lb 1.6 oz), SpO2 93 %.  PHYSICAL EXAMINATION:  GENERAL:  82 y.o.-year-old patient lying in the bed with no acute distress.  EYES: Pupils equal, round, reactive to light and accommodation. No scleral icterus. Extraocular muscles intact.  HEENT: Head atraumatic, normocephalic. Oropharynx and nasopharynx clear.  NECK:  Supple, no jugular venous distention. No thyroid enlargement, no tenderness.  LUNGS: Normal breath sounds bilaterally, no wheezing, no rales. No use of accessory muscles of respiration.  CARDIOVASCULAR: S1, S2 normal. No murmurs, rubs, or gallops.  ABDOMEN: Soft, nontender, nondistended. Bowel sounds present. No organomegaly or mass.  EXTREMITIES: No pedal edema, cyanosis, or clubbing.   NEUROLOGIC: Cranial nerves II through XII are intact. Muscle strength 4/5 in all extremities. Sensation intact. Gait not checked.  PSYCHIATRIC: The patient is alert and oriented x 3.  SKIN: No obvious rash, lesion, or ulcer.    LABORATORY PANEL:   CBC  Recent Labs Lab 08/05/15 0543  WBC 11.3*  HGB 12.5*  HCT 38.0*  PLT 166   ------------------------------------------------------------------------------------------------------------------  Chemistries   Recent Labs Lab 08/03/15 1459  08/06/15 0516  NA 139  < > 138  K 3.8  < > 3.5  CL 104  < > 99*  CO2 29  < > 32  GLUCOSE 145*  < > 192*  BUN 15  < > 39*  CREATININE 0.84  < > 1.05  CALCIUM 8.3*  < > 8.5*  MG 1.9  < > 2.0  AST 19  --   --   ALT 7*  --   --   ALKPHOS 76  --   --   BILITOT 1.1  --   --   < > = values in this interval not displayed. ------------------------------------------------------------------------------------------------------------------   ASSESSMENT AND PLAN:   82 year old male with past medical history of hypertension, chronic afibrillation, diabetes, history of Parkinson's disease, history of previous TIA, history of CHF who presents to the hospital due to shortness of breath, cough, wheezing.  * Acute  on chronic respiratory failure with hypoxia-due to CHF and also underlying COPD. - IV Lasix 20 mg twice a day. Taper steroids,  continue DuoNeb's on the clock, Pulmicort nebs. -Continue O2 supplementation and wean as tolerated.  * Bradycardia with symptomatic pauses in the setting of chronic Afib: -Patient with HR into  the 20's to 30's this afternoon with associated pauses ranging from 4 to 9 seconds -stopped toprol, cardio c/s - may need PPM  * CHF-acute on chronic systolic dysfunction. IV Lasix, follow I's and O's and daily weights. -holding metoprolol, continue lisinopril.  * COPD exacerbation-possibly due to acute bronchitis. continue prednisone, continue DuoNeb's on o'clock,  Pulmicort nebs. Robitussin when necessary  * History of Parkinson's disease-continue Sinemet. Family concerned about more jerky movements - unsure if dose needs to be readjusted (family notes last dose up in Nov 2016)  * Diabetes, not well controlled, Continue sliding scale. Lantus 15 units subcutaneous daily at bedtime.  * Acute renal failure and hyponatremia. Possible due to Lasix. Decrease Lasix to 20 mg twice a day. Follow up BMP.  * Weakness. PT evaluation: No PT follow-up.  All the records are reviewed and case discussed with Care Management/Social Workerr. Management plans discussed with the patient, his wife and they are in agreement.  CODE STATUS: DNR.  TOTAL TIME TAKING CARE OF THIS PATIENT: 42 minutes.  Greater than 50% time was spent on coordination of care and face-to-face counseling.  POSSIBLE D/C IN 1-2 DAYS, DEPENDING ON CLINICAL CONDITION. & cardiac eval   [facility], Troy [name] on 08/06/2015 at 5:40 PM  Between 7am to 6pm - Pager - [phone]  After 6pm go to [email or web address] - password [email or web address] [facility]  [place] [facility]  Office  [phone]  CC: Primary care physician; Troy Reichmann, MD

## 2015-08-06 NOTE — Progress Notes (Signed)
Physical Therapy Treatment Patient Details Name: Troy [name] MRN: [id number] DOB: May 17, 1923 Today's Date: 08/06/2015    History of Present Illness Pt has had ~1 week of SOB and fatigue.  He is feeling better now on 3 liters (2 liters at baseline).  Pt has h/o Parkinsons, CVA, COPD, CHF.    PT Comments    Pt lethargic upon arrival. Discussion with family/caregiver initially regarding pt progress and possible discharge. Pt easily awoken and only complains of fatigue. Pt agreeable to out of bed, ambulation and up in chair. Initial vitals 93% O2 on 2.5 liters and heart rate 79 beats per minute. Pt moderate struggle to sit, but does so with Min A and increased time. Mild complains of dizziness with sitting. Vitals reassessed with O2 saturation 91-92%; educated on pursed lip breathing. Continued monitoring reveals decreased heart rate after several minutes to 34-39 beats per minute; pt asymptomatic including no further dizziness. Nursing arrives noting call from telemetry. Both continued to monitor pt in seated rest position with heart rate now increasing to 105 beats per minute before returning to the mid 80's. Nursing informs pt/family that he is able to discharge home today. Family notes pt does remove O2 for periods of time throughout the day, otherwise on 2 liters. Therapist wishes to monitor pt on room air; nursing agrees. O2 saturation remains at 94% for several minutes and heart rate steady between 77 and 87 beats per minute. Pt agreeable to attempt stand. Pt stands using quad cane in stand. Heart rate decreases again to 34 beats per minute. Pt returned to sit position; telemetry response again. Pt returned to supine position with assist of 2 to reposition upward in bed. No further PT attempted at this time. Nursing in room with MD notified of heart rate concerns. Continue PT as appropriate if pt is not discharged today. Initial recommendation following hospital stay was home without PT follow up.  Recommend reassessing pt next visit for confirmation or change is discharge recommendations.   Follow Up Recommendations  Other (comment) (Home health/24 hour assist versus SNF; further assessment )     Equipment Recommendations       Recommendations for Other Services       Precautions / Restrictions Precautions Precautions: Fall Restrictions Weight Bearing Restrictions: No    Mobility  Bed Mobility Overal bed mobility: Needs Assistance Bed Mobility: Supine to Sit;Sit to Supine;Rolling Rolling: Min assist   Supine to sit: Min assist;HOB elevated Sit to supine: Min assist;HOB elevated   General bed mobility comments: Increased time/effort with assist required for trunk to sit; assist of 2 to reposition upward in bed.   Transfers Overall transfer level: Needs assistance Equipment used: Quad cane Transfers: Sit to/from Stand Sit to Stand: Min guard         General transfer comment: Cues for hand placement. Heart rate decreased to 34; asymptomatic other than fatigue; pt returned to sit position  Ambulation/Gait Ambulation/Gait assistance:  (deferred due to fluctuating HR)               Stairs            Wheelchair Mobility    Modified Rankin (Stroke Patients Only)       Balance Overall balance assessment: Needs assistance Sitting-balance support: Feet supported;Bilateral upper extremity supported Sitting balance-Leahy Scale: Good Sitting balance - Comments: Mild dizziness noted upon sit; pt sits for a while focusing on puirsed lip breathing with/without O2. Does increase to 4L on room air after 5 minutes  Standing balance support: Single extremity supported Standing balance-Leahy Scale: Fair Standing balance comment: One episode in short stand period of pt slolwy leaning forward with need for tactile cue/Min A to right                     Cognition Arousal/Alertness: Lethargic Behavior During Therapy: WFL for tasks  assessed/performed Overall Cognitive Status: Within Functional Limits for tasks assessed                      Exercises      General Comments        Pertinent Vitals/Pain Pain Assessment: No/denies pain    Home Living                      Prior Function            PT Goals (current goals can now be found in the care plan section) Progress towards PT goals: Not progressing toward goals - comment    Frequency  Min 2X/week    PT Plan Other (comment) (Needs to be re evaluated at next session)    Co-evaluation             End of Session Equipment Utilized During Treatment: Gait belt;Oxygen;Other (comment) (Part of session on 2.5L O2; part on room air) Activity Tolerance: Other (comment) (Limited by fluctuating HR with telemetry team responding) Patient left: in bed;with call bell/phone within reach;with bed alarm set;with family/visitor present;with nursing/sitter in room     Time: 1400-1432 PT Time Calculation (min) (ACUTE ONLY): 32 min  Charges:  $Therapeutic Activity: 23-37 mins                    G CodesKristeen [name]:      Troy [name] Elizabeth Bishop, PTA 08/06/2015, 3:07 PM

## 2015-08-06 NOTE — Progress Notes (Signed)
ANTICOAGULATION CONSULT NOTE - FOLLOW UP   Pharmacy Consult for Warfarin  Indication: atrial fibrillation  No Known Allergies  Patient Measurements: Height: 5\' 9"  (175.3 cm) Weight: 230 lb 1.6 oz (104.373 kg) IBW/kg (Calculated) : 70.7  Vital Signs: Temp: 97.7 F (36.5 C) (04/03 0744) Temp Source: Oral (04/03 0744) BP: 155/78 mmHg (04/03 0744) Pulse Rate: 72 (04/03 0744)  Labs:  Recent Labs  08/03/15 1459 08/03/15 2307 08/04/15 0120 08/04/15 0403 08/04/15 0458 08/05/15 0543 08/06/15 0516 08/06/15 1016  HGB 13.2  --   --   --  12.9* 12.5*  --   --   HCT 40.2  --   --   --  38.5* 38.0*  --   --   PLT 158  --   --   --  150 166  --   --   APTT 47*  --   --   --   --   --   --   --   LABPROT 35.4*  --   --   --  34.6* 34.0*  --  21.9*  INR 3.64  --   --   --  3.53 3.45  --  1.92  CREATININE 0.84  --   --   --  1.12 1.33* 1.05  --   TROPONINI <0.03 <0.03 <0.03 <0.03  --   --   --   --     Estimated Creatinine Clearance: 55.7 mL/min (by C-G formula based on Cr of 1.05).   Medical History: Past Medical History  Diagnosis Date  . Diabetes mellitus without complication (HCC)   . Hypertension   . UTI (lower urinary tract infection)   . Parkinson's disease (HCC)   . TIA (transient ischemic attack)   . Atrial fibrillation (HCC)   . CHF (congestive heart failure) (HCC)   . Stroke (HCC)   . Hypothyroidism     Assessment: 82 yo male brought to hospital due to SOB and wheezing. Pharmacy has been consulted to monitor/manage patients warfarin for his chronic A. Fib.  Patients home dose:  Warfarin 3mg  T, Th, Sat  Warfarin 6mg  M,W,F, Sun   3/31 INR on admission: 3.64; held  4/1: INR : 3.53; HELD 4/2: INR= 3.45; held   Goal of Therapy:  INR 2-3 Monitor platelets by anticoagulation protocol: Yes   Plan:  INR resulted @ 1.92. Will give home dose of warfarin 6 mg PO x 1 and will reassess dosing based on PT/INR results in am. Pharmacy to follow per consult.    Demetrius Charityeldrin D. Toni Hoffmeister, PharmD, BCPS Clinical Pharmacist   08/06/2015,11:14 AM

## 2015-08-06 NOTE — Progress Notes (Signed)
Initial Heart Failure Clinic appointment scheduled for August 20, 2015 at 11:30am. Thank you.

## 2015-08-06 NOTE — Progress Notes (Signed)
Pt's heartrate dropped to 35 while sitting up on side of bed with PT.  Dr. Karlene LinemanV Shah made aware of this.  Orders received to watch pt of another hour and if heart rate remains stable to discharge home

## 2015-08-06 NOTE — Progress Notes (Signed)
Pt complaining of nausea, Zofran 4mg  iv given will monitor.

## 2015-08-06 NOTE — Care Management Important Message (Signed)
Important Message  Patient Details  Name: Troy [name] MRN: [id number] Date of Birth: 08-May-1923   Medicare Important Message Given:  Yes    Olegario MessierKathy A Aurelia Gras 08/06/2015, 1:48 PM

## 2015-08-06 NOTE — Care Management Note (Signed)
Case Management Note  Patient Details  Name: Troy [name] MRN: [id number] Date of Birth: 04-16-1924  Subjective/Objective:  CM consult for discharge planning. Met with patient, his wife and sitter at bedside. PT has recommended no PT follow up. Sitter is with patient 5 days a week, 8 hours per day. Wife refused any HH SN. PCP is Dr. Ginette Pitman. Spouse denies issues obtaining medications, copays or transportation.   Action/Plan: No needs anticipated. Case Closed  Expected Discharge Date:                  Expected Discharge Plan:  Home/Self Care  In-House Referral:     Discharge planning Services  CM Consult  Post Acute Care Choice:  Home Health Choice offered to:  Spouse, Patient  DME Arranged:    DME Agency:     HH Arranged:  Patient Refused [place] Agency:     Status of Service:  Completed, signed off  Medicare Important Message Given:    Date Medicare IM Given:    Medicare IM give by:    Date Additional Medicare IM Given:    Additional Medicare Important Message give by:     If discussed at [place] of Stay Meetings, dates discussed:    Additional Comments:  Jolly Mango, RN 08/06/2015, 11:58 AM

## 2015-08-06 NOTE — Progress Notes (Signed)
Inpatient Diabetes Program Recommendations  AACE/ADA: New Consensus Statement on Inpatient Glycemic Control (2015)  Target Ranges:  Prepandial:   less than 140 mg/dL      Peak postprandial:   less than 180 mg/dL (1-2 hours)      Critically ill patients:  140 - 180 mg/dL   Review of Glycemic Control  Results for Troy [name], Amiel S (MRN [id number]) as of 08/06/2015 11:13  Ref. Range 08/05/2015 07:20 08/05/2015 11:34 08/05/2015 17:16 08/05/2015 21:13 08/06/2015 07:41  Glucose-Capillary Latest Ref Range: 65-99 mg/dL 562371 (H) 130336 (H) 865235 (H) 178 (H) 186 (H)    Diabetes history: Type 2 Outpatient Diabetes medications: Glipizide 10mg  qam, Glipizide 5mg  qpm, Onglyza 5mg /day Current orders for Inpatient glycemic control: Lantus 15 units qhs (started yesterday), Novolog 0-20 units tid, Novolog 0-5units qhs * steroids qam  Inpatient Diabetes Program Recommendations: Agree with current orders for diabetes management.  Depending on fasting blood sugars tomorrow, may need to increase Lantus. Will follow.  Susette RacerJulie Jonas Goh, RN, BA, MHA, CDE Diabetes Coordinator Inpatient Diabetes Program  [phone] (Team Pager) [phone] [facility] Office) 08/06/2015 11:16 AM

## 2015-08-06 NOTE — Progress Notes (Signed)
Noticed pt heart rate on monitor trending down, went to room, wife stated pt's eyes rolled back and he called her name.  Pt alert now, states "im hot"  VSS, FSBS 240.  Dr. Karlene LinemanV. Shah at bedside speaking with family.  Will cancel discharge for now.

## 2015-08-06 NOTE — Discharge Instructions (Signed)
Heart Failure Clinic appointment on August 20, 2015 at 11:30am with Troy Kindredina Hackney, FNP. Please call [phone] to reschedule.   Heart Failure Heart failure means your heart has trouble pumping blood. This makes it hard for your body to work well. Heart failure is usually a long-term (chronic) condition. You must take good care of yourself and follow your doctor's treatment plan. HOME CARE  Take your heart medicine as told by your doctor.  Do not stop taking medicine unless your doctor tells you to.  Do not skip any dose of medicine.  Refill your medicines before they run out.  Take other medicines only as told by your doctor or pharmacist.  Stay active if told by your doctor. The elderly and people with severe heart failure should talk with a doctor about physical activity.  Eat heart-healthy foods. Choose foods that are without trans fat and are low in saturated fat, cholesterol, and salt (sodium). This includes fresh or frozen fruits and vegetables, fish, lean meats, fat-free or low-fat dairy foods, whole grains, and high-fiber foods. Lentils and dried peas and beans (legumes) are also good choices.  Limit salt if told by your doctor.  Cook in a healthy way. Roast, grill, broil, bake, poach, steam, or stir-fry foods.  Limit fluids as told by your doctor.  Weigh yourself every morning. Do this after you pee (urinate) and before you eat breakfast. Write down your weight to give to your doctor.  Take your blood pressure and write it down if your doctor tells you to.  Ask your doctor how to check your pulse. Check your pulse as told.  Lose weight if told by your doctor.  Stop smoking or chewing tobacco. Do not use gum or patches that help you quit without your doctor's approval.  Schedule and go to doctor visits as told.  Nonpregnant women should have no more than 1 drink a day. Men should have no more than 2 drinks a day. Talk to your doctor about drinking alcohol.  Stop  illegal drug use.  Stay current with shots (immunizations).  Manage your health conditions as told by your doctor.  Learn to manage your stress.  Rest when you are tired.  If it is really hot outside:  Avoid intense activities.  Use air conditioning or fans, or get in a cooler place.  Avoid caffeine and alcohol.  Wear loose-fitting, lightweight, and light-colored clothing.  If it is really cold outside:  Avoid intense activities.  Layer your clothing.  Wear mittens or gloves, a hat, and a scarf when going outside.  Avoid alcohol.  Learn about heart failure and get support as needed.  Get help to maintain or improve your quality of life and your ability to care for yourself as needed. GET HELP IF:   You gain weight quickly.  You are more short of breath than usual.  You cannot do your normal activities.  You tire easily.  You cough more than normal, especially with activity.  You have any or more puffiness (swelling) in areas such as your hands, feet, ankles, or belly (abdomen).  You cannot sleep because it is hard to breathe.  You feel like your heart is beating fast (palpitations).  You get dizzy or light-headed when you stand up. GET HELP RIGHT AWAY IF:   You have trouble breathing.  There is a change in mental status, such as becoming less alert or not being able to focus.  You have chest pain or discomfort.  You faint.  MAKE SURE YOU:   Understand these instructions.  Will watch your condition.  Will get help right away if you are not doing well or get worse.   This information is not intended to replace advice given to you by your health care provider. Make sure you discuss any questions you have with your health care provider.   Document Released: 01/29/2008 Document Revised: 05/12/2014 Document Reviewed: 06/07/2012 Elsevier Interactive Patient Education [employer].

## 2015-08-06 NOTE — Consult Note (Signed)
ANTICOAGULATION CONSULT NOTE - Initial Consult  Pharmacy Consult for heparin drip Indication: atrial fibrillation  No Known Allergies  Patient Measurements: Height: 5\' 9"  (175.3 cm) Weight: 230 lb 1.6 oz (104.373 kg) IBW/kg (Calculated) : 70.7 Heparin Dosing Weight: 93.2kg  Vital Signs: Temp: 97.7 F (36.5 C) (04/03 1124) Temp Source: Oral (04/03 1124) BP: 145/75 mmHg (04/03 1124) Pulse Rate: 79 (04/03 1400)  Labs:  Recent Labs  08/03/15 2307 08/04/15 0120 08/04/15 0403 08/04/15 0458 08/05/15 0543 08/06/15 0516 08/06/15 1016  HGB  --   --   --  12.9* 12.5*  --   --   HCT  --   --   --  38.5* 38.0*  --   --   PLT  --   --   --  150 166  --   --   LABPROT  --   --   --  34.6* 34.0*  --  21.9*  INR  --   --   --  3.53 3.45  --  1.92  CREATININE  --   --   --  1.12 1.33* 1.05  --   TROPONINI <0.03 <0.03 <0.03  --   --   --   --     Estimated Creatinine Clearance: 55.7 mL/min (by C-G formula based on Cr of 1.05).   Medical History: Past Medical History  Diagnosis Date  . Diabetes mellitus without complication (HCC)   . Hypertension   . UTI (lower urinary tract infection)   . Parkinson's disease (HCC)   . TIA (transient ischemic attack)   . Chronic atrial fibrillation (HCC)     a. on Couamdin; b. CHADS2VASc at least 7 (CHF, HTN, age x 2, DM, stroke x 2); c. s/p MAZE 1999  . Chronic systolic CHF (congestive heart failure) (HCC)     a. echo 08/01/2015: EF of 45%, mild LVH, mitral valve with annular calcification, mitral valve partially mobile  . Stroke (HCC)   . Hypothyroidism   . Normal coronary arteries     a. by cardiac cath 1999  . Mitral valve prolapse     a. s/p mitral valve repair 1999    Medications:  Scheduled:  . antiseptic oral rinse  7 mL Mouth Rinse BID  . budesonide (PULMICORT) nebulizer solution  0.25 mg Nebulization BID  . carbidopa-levodopa  1 tablet Oral TID  . furosemide  20 mg Intravenous Q12H  . insulin aspart  0-20 Units Subcutaneous  TID WC  . insulin aspart  0-5 Units Subcutaneous QHS  . insulin glargine  15 Units Subcutaneous Daily  . ipratropium-albuterol  3 mL Nebulization Q4H  . lisinopril  40 mg Oral QHS  . loratadine  10 mg Oral Daily  . multivitamin-lutein  1 capsule Oral BID  . polyethylene glycol  17 g Oral Daily  . predniSONE  40 mg Oral Q breakfast  . senna-docusate  1 tablet Oral BID  . sodium chloride flush  3 mL Intravenous Q12H    Assessment: Pt is a 82 year old male with a PMH of afib. Pt takes warfarin at home. INR is 1.92 today. Pt received dose this evening already. Pt had high grade AV block and long ventricular pauses on ECG. Pt is to have pacemaker placed. Per Dr. Kirke CorinArida, would like to convert pt to heparin drip. Since pt did receive warfarin dose today, has a INR very close to therapeutic will not bolus  Goal of Therapy:  Heparin level 0.3-0.7 units/ml Monitor platelets by  anticoagulation protocol: Yes   Plan:  Start heparin infusion at 1350 units/hr Check anti-Xa level in 8 hours and daily while on heparin Continue to monitor H&H and platelets  Madylyn Insco D Richa Shor, Pharm.D Clinical Pharmacist   08/06/2015,6:19 PM

## 2015-08-06 NOTE — Consult Note (Signed)
Cardiology Consultation Note  Patient ID: Troy [name], MRN: [id number], DOB/AGE: [birth year] 82 y.o. Admit date: 08/03/2015   Date of Consult: 08/06/2015 Primary Physician: Barbette Reichmann, MD Primary Cardiologist: Dr. Regino Schultze, MD with [facility]  Chief Complaint: SOB Reason for Consult: Bradycardia with multiple pauses, greatest 9.9 seconds   HPI: 82 y.o. male with h/o chronic Afib on Coumadin s/p multiple DCCV s/p MAZE procedure at the time of his mitral valve repair s/p DCCV 6 months following MAZE, mitral valve prolapse s/p repair in 1999, chronic respiratory failure on home oxygen, chronic systolic CHF, Parkinson's disease, HTN, HLD, OSA on CPAP, and diabetic polyneuropathy who presented to [facility] on 3/31 with increased SOB felt to be 2/2 viral URI. While attempting to work with PT today he became bradycardic into the 20's and 30's and had multiple pauses, greatest noted to be 9.9 seconds. Cardiology is consulted for further evaluation.   Cardiac catheterization in 1999 at the time of his mitral valve repair showed normal coronary arteries. No ischemic evaluations since then. He has been managed in chronic Afib on metoprolol and Coumadin. Most recent echo from 08/01/2015 in the setting of increased SOB showed EF 45%, trivial AI, trival MR, mild MS. He has been seeing his PCP in mid and late March for URI/acute bronchitis that was treated with antibiotics. He continued to be SOB in late March and was advised to increase his Lasix to 40 mg daily. This had no effect on his SOB. Echo from 3/29 showed EF of 45%, mild LVH, mitral valve with annular calcification, mitral valve partially mobile. Prior echo from 2006 showed EF >55%, no LVH. He presented to [facility] 3/31 with the above increased SOB. In the ED he was noted to be in acute respiratory distress with hypoxia. CXR showed stable cardiomegaly with vascular congestion with no acute findings. Troponin negative x 3, BNP 65, influenza negative, non-acute CBC or  bmet. ECG showed Afib, 86 bpm, RBBB, rare PVC, poor R wave progression, no acute st/t changes. He had been diuresed with IV Lasix and treated with nebs by IM throughout his admission. Today, when standing to work with PT he became extremely dizzy, flushed, and per his report he felt like he was dying. On telemetry he brady'd down into the 20's with associated significant pauses up to 9.9 seconds in length at the greatest. He has also had pauses of 6.69 seconds, 4.09 seconds, 4.07 seconds later in the above episode, and 4.01 seconds. He currently has a HR in the 70's. Of note he takes Toprol XL 50 mg daily for his Afib. Upon cardiology seeing the patient in consult transcutaneous pacing pads were asked to be placed on the patient.    Past Medical History  Diagnosis Date  . Diabetes mellitus without complication (HCC)   . Hypertension   . UTI (lower urinary tract infection)   . Parkinson's disease (HCC)   . TIA (transient ischemic attack)   . Chronic atrial fibrillation (HCC)     a. on Couamdin; b. CHADS2VASc at least 7 (CHF, HTN, age x 2, DM, stroke x 2); c. s/p MAZE 1999  . Chronic systolic CHF (congestive heart failure) (HCC)     a. echo 08/01/2015: EF of 45%, mild LVH, mitral valve with annular calcification, mitral valve partially mobile  . Stroke (HCC)   . Hypothyroidism   . Normal coronary arteries     a. by cardiac cath 1999  . Mitral valve prolapse     a. s/p mitral valve  repair 1999      Most Recent Cardiac Studies: Echo 08/01/2015: ECHOCARDIOGRAPHIC DESCRIPTIONS  AORTIC ROOT Size:Normal Dissection:INDETERM FOR DISSECTION  AORTIC VALVE Leaflets:Tricuspid Morphology:Normal Mobility:Fully mobile  LEFT VENTRICLE Size:Normal Anterior:Normal Contraction:REGIONALLY IMPAIRED Lateral:Normal Closest EF:45% (Estimated) Septal:Normal LV Masses:No Masses Apical:Normal WUJ:WJXB LVH CONCENTRIC Inferior:Normal Posterior:Normal  Cardiac cath 1999: DIAGNOSTIC SUMMARY Coronary Artery  Disease Left Ventriculogram Left Main: normal Ejection Fraction: 53% LAD system: normal Mitral regurgitation: Moderate LCX system: normal Mitral valve prolapse: Definite RCA system: insignificant Right Heart Catheterization No significant CAD indicated Pulmonary Hypertension: Mild   Surgical History:  Past Surgical History  Procedure Laterality Date  . Cardiac surgery    . Cholecystectomy       Home Meds: Prior to Admission medications   Medication Sig Start Date End Date Taking? Authorizing Provider  albuterol (PROVENTIL HFA) 108 (90 Base) MCG/ACT inhaler Inhale 2 puffs into the lungs every 4 (four) hours as needed for wheezing or shortness of breath. 07/11/15  Yes Sharman Cheek, MD  carbidopa-levodopa (SINEMET IR) 25-250 MG tablet Take 1 tablet by mouth 3 (three) times daily.   Yes Historical Provider, MD  cetirizine (ZYRTEC) 10 MG tablet Take 10 mg by mouth daily.   Yes Historical Provider, MD  furosemide (LASIX) 40 MG tablet Take 40 mg by mouth daily.   Yes Historical Provider, MD  glipiZIDE (GLUCOTROL) 10 MG tablet Take 5-10 mg by mouth 2 (two) times daily. Pt takes one tablet in the morning and one-half tablet in the evening.   Yes Historical Provider, MD  insulin regular human CONCENTRATED (HUMULIN R) 500 UNIT/ML injection Inject 4-6 Units into the skin 2 (two) times daily with a meal. Pt uses four units with breakfast and six units with dinner.   Yes Historical Provider, MD  lisinopril (PRINIVIL,ZESTRIL) 40 MG tablet Take 40 mg by mouth at bedtime.    Yes Historical Provider, MD  Multiple Vitamins-Minerals (PRESERVISION AREDS 2) CAPS Take 1 capsule by mouth 2 (two) times daily.   Yes Historical Provider, MD  saxagliptin HCl (ONGLYZA) 2.5 MG TABS tablet Take 5 mg by mouth daily.   Yes Historical Provider, MD  Tetrahydroz-Dextran-PEG-Povid (EYE DROPS ADVANCED RELIEF) 0.05-0.1-1-1 % SOLN Place 1 drop into both eyes daily.   Yes Historical Provider, MD  warfarin (COUMADIN) 3 MG tablet  Take 3 mg by mouth every evening. Pt takes on Tuesday, Thursday, and Saturday.   Yes Historical Provider, MD  warfarin (COUMADIN) 6 MG tablet Take 6 mg by mouth every evening. Pt takes on Monday, Wednesday, Friday, and Sunday.   Yes Historical Provider, MD  metoprolol succinate (TOPROL-XL) 25 MG 24 hr tablet Take 1 tablet (25 mg total) by mouth every evening. Take with or immediately following a meal. 08/06/15   Delfino Lovett, MD    Inpatient Medications:  . antiseptic oral rinse  7 mL Mouth Rinse BID  . budesonide (PULMICORT) nebulizer solution  0.25 mg Nebulization BID  . carbidopa-levodopa  1 tablet Oral TID  . furosemide  20 mg Intravenous Q12H  . insulin aspart  0-20 Units Subcutaneous TID WC  . insulin aspart  0-5 Units Subcutaneous QHS  . insulin glargine  15 Units Subcutaneous Daily  . ipratropium-albuterol  3 mL Nebulization Q4H  . lisinopril  40 mg Oral QHS  . loratadine  10 mg Oral Daily  . multivitamin-lutein  1 capsule Oral BID  . polyethylene glycol  17 g Oral Daily  . predniSONE  40 mg Oral Q breakfast  . senna-docusate  1 tablet  Oral BID  . sodium chloride flush  3 mL Intravenous Q12H  . warfarin  6 mg Oral ONCE-1800  . Warfarin - Pharmacist Dosing Inpatient   Does not apply q1800      Allergies: No Known Allergies  Social History   Social History  . Marital Status: Married    Spouse Name: N/A  . Number of Children: N/A  . Years of Education: N/A   Occupational History  . Not on file.   Social History Main Topics  . Smoking status: Former Smoker -- 4.00 packs/day for 20 years    Types: Cigarettes  . Smokeless tobacco: Never Used  . Alcohol Use: No  . Drug Use: No  . Sexual Activity: Not on file   Other Topics Concern  . Not on file   Social History Narrative     Family History  Problem Relation Age of Onset  . Hypertension Mother   . Stroke Mother   . CAD Father   . Lymphoma Sister   . Lung disease Brother      Review of Systems: Review of  Systems  Constitutional: Positive for weight loss and malaise/fatigue. Negative for fever, chills and diaphoresis.  HENT: Negative for congestion.   Eyes: Negative for discharge and redness.  Respiratory: Positive for shortness of breath. Negative for cough, hemoptysis, sputum production and wheezing.   Cardiovascular: Negative for chest pain, palpitations, orthopnea, claudication, leg swelling and PND.  Gastrointestinal: Positive for nausea. Negative for vomiting and abdominal pain.  Musculoskeletal: Negative for myalgias and falls.  Skin: Negative for rash.  Neurological: Positive for dizziness and weakness. Negative for tingling, tremors, sensory change, speech change, focal weakness, seizures and loss of consciousness.  Endo/Heme/Allergies: Does not bruise/bleed easily.  Psychiatric/Behavioral: Negative for substance abuse. The patient is not nervous/anxious.   All other systems reviewed and are negative.   Labs:  Recent Labs  08/03/15 2307 08/04/15 0120 08/04/15 0403  TROPONINI <0.03 <0.03 <0.03   Lab Results  Component Value Date   WBC 11.3* 08/05/2015   HGB 12.5* 08/05/2015   HCT 38.0* 08/05/2015   MCV 89.1 08/05/2015   PLT 166 08/05/2015    Recent Labs Lab 08/03/15 1459  08/06/15 0516  NA 139  < > 138  K 3.8  < > 3.5  CL 104  < > 99*  CO2 29  < > 32  BUN 15  < > 39*  CREATININE 0.84  < > 1.05  CALCIUM 8.3*  < > 8.5*  PROT 7.1  --   --   BILITOT 1.1  --   --   ALKPHOS 76  --   --   ALT 7*  --   --   AST 19  --   --   GLUCOSE 145*  < > 192*  < > = values in this interval not displayed. No results found for: CHOL, HDL, LDLCALC, TRIG No results found for: DDIMER  Radiology/Studies:  Dg Chest 2 View  07/11/2015  CLINICAL DATA:  Shortness breath since 07/08/2015. EXAM: CHEST  2 VIEW COMPARISON:  Chest, abdomen and pelvis CT scan and single view of the chest 02/25/2015. FINDINGS: There is cardiomegaly and mild vascular congestion. Trace right pleural effusion is  noted. No left pleural effusion pneumothorax. No consolidative process. Remote left rib fractures noted. IMPRESSION: Cardiomegaly and pulmonary vascular congestion. Trace right pleural effusion. Electronically Signed   By: Drusilla Kanner M.D.   On: 07/11/2015 16:30   Dg Chest [facility]  08/03/2015  CLINICAL DATA:  Cold x 2 weeks, shortness of breath today EXAM: PORTABLE CHEST 1 VIEW COMPARISON:  07/11/2015 FINDINGS: Severe cardiac enlargement stable. Moderate vascular congestion stable. No infiltrate or effusion. IMPRESSION: Stable cardiomegaly and vascular congestion with no acute findings Electronically Signed   By: Esperanza Heiraymond  Rubner M.D.   On: 08/03/2015 15:05    EKG: Afib, 86 bpm, RBBB, rare PVC, poor R wave progression, no acute st/t changes   Weights: Filed Weights   08/03/15 1429 08/04/15 1339  Weight: 232 lb (105.235 kg) 230 lb 1.6 oz (104.373 kg)     Physical Exam: Blood pressure 145/75, pulse 79, temperature 97.7 F (36.5 C), temperature source Oral, resp. rate 16, height 5\' 9"  (1.753 m), weight 230 lb 1.6 oz (104.373 kg), SpO2 93 %. Body mass index is 33.96 kg/(m^2). General: Well developed, well nourished, in no acute distress. Head: Normocephalic, atraumatic, sclera non-icteric, no xanthomas, nares are without discharge.  Neck: Negative for carotid bruits. JVD not elevated. Lungs: Clear bilaterally to auscultation without wheezes, rales, or rhonchi. Breathing is unlabored. Heart: Irregularly irregular with S1 S2. No murmurs, rubs, or gallops appreciated. Abdomen: Obese, soft, non-tender, non-distended with normoactive bowel sounds. No hepatomegaly. No rebound/guarding. No obvious abdominal masses. Msk:  Strength and tone appear normal for age. Extremities: No clubbing or cyanosis. No edema.  Distal pedal pulses are 2+ and equal bilaterally. Neuro: Alert and oriented X 3. No facial asymmetry. No focal deficit. Moves all extremities spontaneously. Psych:  Responds to questions  appropriately with a normal affect.    Assessment and Plan:   1. Bradycardia with symptomatic pauses in the setting of chronic Afib: -Patient with HR into the 20's to 30's this afternoon with associated pauses ranging from 4 to 9 seconds -Heart rate currently in the 70's, Afib -Since his admission he has been on Toprol XL 50 mg every evening -Hold Toprol XL and assess for appropriate chronotropic response -Apply transcutaneous pacing pads -Potassium is not elevated, currently 3.5 -TSH from 08/2014 normal, will add on thyroid studies to labs previously drawn -Magnesium 2.0 -Given that he has chronic Afib, and he is now with bradycardic rates with significant pauses he may be best suited with a PPM -Case to be discussed with EP for rounds on 08/07/2015  2. Chronic Afib/tachy-brady syndrome: -Currently rate controlled -Toprol XL held as above -Coumadin -CHADS2VASc at least 7 (CHF, HTN, age x 2, DM, stroke x 2) -As above, will have EP consult on patient 4/4  3. Acute on chronic respiratory failure: -Remains SOB -Does not appear grossly volume overloaded at this time -Wean O2 as able to home dosing  4. Chronic systolic CHF: -Toprol XL held as above -Lisinopril   5. S/p mitral valve annuloplasty and repair: -Stable on most recent echo   Elinor DodgeSigned, Nathanyl Andujo, PA-C Pager: [phone] 08/06/2015, 4:55 PM

## 2015-08-07 ENCOUNTER — Inpatient Hospital Stay: Payer: Medicare Other

## 2015-08-07 ENCOUNTER — Other Ambulatory Visit: Payer: Self-pay

## 2015-08-07 ENCOUNTER — Encounter: Payer: Self-pay | Admitting: Cardiovascular Disease

## 2015-08-07 ENCOUNTER — Inpatient Hospital Stay (HOSPITAL_COMMUNITY)
Admission: AD | Admit: 2015-08-07 | Discharge: 2015-08-09 | DRG: 243 | Disposition: A | Discharge status: Home / Self Care | Payer: Medicare Other | Source: Other Acute Inpatient Hospital | Attending: Cardiology | Admitting: Cardiology

## 2015-08-07 DIAGNOSIS — I509 Heart failure, unspecified: Secondary | ICD-10-CM | POA: Diagnosis not present

## 2015-08-07 DIAGNOSIS — R55 Syncope and collapse: Secondary | ICD-10-CM | POA: Diagnosis present

## 2015-08-07 DIAGNOSIS — E039 Hypothyroidism, unspecified: Secondary | ICD-10-CM | POA: Diagnosis present

## 2015-08-07 DIAGNOSIS — R404 Transient alteration of awareness: Secondary | ICD-10-CM

## 2015-08-07 DIAGNOSIS — J349 Unspecified disorder of nose and nasal sinuses: Secondary | ICD-10-CM | POA: Diagnosis present

## 2015-08-07 DIAGNOSIS — J449 Chronic obstructive pulmonary disease, unspecified: Secondary | ICD-10-CM | POA: Diagnosis present

## 2015-08-07 DIAGNOSIS — Z87891 Personal history of nicotine dependence: Secondary | ICD-10-CM | POA: Diagnosis not present

## 2015-08-07 DIAGNOSIS — I482 Chronic atrial fibrillation, unspecified: Secondary | ICD-10-CM | POA: Insufficient documentation

## 2015-08-07 DIAGNOSIS — E785 Hyperlipidemia, unspecified: Secondary | ICD-10-CM | POA: Diagnosis present

## 2015-08-07 DIAGNOSIS — I455 Other specified heart block: Secondary | ICD-10-CM | POA: Diagnosis not present

## 2015-08-07 DIAGNOSIS — I5022 Chronic systolic (congestive) heart failure: Secondary | ICD-10-CM | POA: Diagnosis present

## 2015-08-07 DIAGNOSIS — Z8673 Personal history of transient ischemic attack (TIA), and cerebral infarction without residual deficits: Secondary | ICD-10-CM | POA: Diagnosis not present

## 2015-08-07 DIAGNOSIS — I110 Hypertensive heart disease with heart failure: Secondary | ICD-10-CM | POA: Diagnosis present

## 2015-08-07 DIAGNOSIS — R001 Bradycardia, unspecified: Secondary | ICD-10-CM | POA: Diagnosis present

## 2015-08-07 DIAGNOSIS — G20 Parkinson's disease: Secondary | ICD-10-CM | POA: Diagnosis present

## 2015-08-07 DIAGNOSIS — Z7984 Long term (current) use of oral hypoglycemic drugs: Secondary | ICD-10-CM | POA: Diagnosis not present

## 2015-08-07 DIAGNOSIS — E1142 Type 2 diabetes mellitus with diabetic polyneuropathy: Secondary | ICD-10-CM | POA: Diagnosis present

## 2015-08-07 DIAGNOSIS — Z9981 Dependence on supplemental oxygen: Secondary | ICD-10-CM | POA: Diagnosis not present

## 2015-08-07 DIAGNOSIS — Z95818 Presence of other cardiac implants and grafts: Secondary | ICD-10-CM

## 2015-08-07 DIAGNOSIS — Z7901 Long term (current) use of anticoagulants: Secondary | ICD-10-CM

## 2015-08-07 DIAGNOSIS — I495 Sick sinus syndrome: Secondary | ICD-10-CM | POA: Diagnosis present

## 2015-08-07 DIAGNOSIS — G4733 Obstructive sleep apnea (adult) (pediatric): Secondary | ICD-10-CM | POA: Diagnosis present

## 2015-08-07 DIAGNOSIS — Z79899 Other long term (current) drug therapy: Secondary | ICD-10-CM | POA: Diagnosis not present

## 2015-08-07 DIAGNOSIS — Z794 Long term (current) use of insulin: Secondary | ICD-10-CM

## 2015-08-07 DIAGNOSIS — J9611 Chronic respiratory failure with hypoxia: Secondary | ICD-10-CM | POA: Diagnosis present

## 2015-08-07 DIAGNOSIS — R4189 Other symptoms and signs involving cognitive functions and awareness: Secondary | ICD-10-CM | POA: Insufficient documentation

## 2015-08-07 LAB — PROTIME-INR
INR: 1.56
Prothrombin Time: 18.7 seconds — ABNORMAL HIGH (ref 11.4–15.0)

## 2015-08-07 LAB — CBC
HCT: 38.4 % — ABNORMAL LOW (ref 40.0–52.0)
HEMOGLOBIN: 13 g/dL (ref 13.0–18.0)
MCH: 29.4 pg (ref 26.0–34.0)
MCHC: 33.8 g/dL (ref 32.0–36.0)
MCV: 86.9 fL (ref 80.0–100.0)
Platelets: 163 10*3/uL (ref 150–440)
RBC: 4.42 MIL/uL (ref 4.40–5.90)
RDW: 14.9 % — ABNORMAL HIGH (ref 11.5–14.5)
WBC: 10.1 10*3/uL (ref 3.8–10.6)

## 2015-08-07 LAB — GLUCOSE, CAPILLARY
GLUCOSE-CAPILLARY: 302 mg/dL — AB (ref 65–99)
GLUCOSE-CAPILLARY: 265 mg/dL — AB (ref 65–99)
Glucose-Capillary: 220 mg/dL — ABNORMAL HIGH (ref 65–99)
Glucose-Capillary: 280 mg/dL — ABNORMAL HIGH (ref 65–99)

## 2015-08-07 LAB — MRSA PCR SCREENING: MRSA by PCR: NEGATIVE

## 2015-08-07 LAB — HEPARIN LEVEL (UNFRACTIONATED)

## 2015-08-07 MED ORDER — INSULIN ASPART 100 UNIT/ML ~~LOC~~ SOLN
0.0000 [IU] | Freq: Three times a day (TID) | SUBCUTANEOUS | Status: DC
  Administered 2015-08-07: 8 [IU] via SUBCUTANEOUS
  Administered 2015-08-08 (×3): 5 [IU] via SUBCUTANEOUS

## 2015-08-07 MED ORDER — FUROSEMIDE 10 MG/ML IJ SOLN
20.0000 mg | Freq: Two times a day (BID) | INTRAMUSCULAR | Status: DC
  Administered 2015-08-07 – 2015-08-09 (×4): 20 mg via INTRAVENOUS
  Filled 2015-08-07 (×4): qty 2

## 2015-08-07 MED ORDER — ALBUTEROL SULFATE HFA 108 (90 BASE) MCG/ACT IN AERS: 2.0000 | INHALATION_SPRAY | RESPIRATORY_TRACT | Status: DC | PRN

## 2015-08-07 MED ORDER — ALBUTEROL SULFATE (2.5 MG/3ML) 0.083% IN NEBU: 2.5000 mg | INHALATION_SOLUTION | RESPIRATORY_TRACT | Status: DC | PRN

## 2015-08-07 MED ORDER — SENNOSIDES-DOCUSATE SODIUM 8.6-50 MG PO TABS
1.0000 | ORAL_TABLET | Freq: Two times a day (BID) | ORAL | Status: DC
  Administered 2015-08-07 – 2015-08-09 (×4): 1 via ORAL
  Filled 2015-08-07 (×4): qty 1

## 2015-08-07 MED ORDER — ONDANSETRON HCL 4 MG/2ML IJ SOLN: 4.0000 mg | Freq: Four times a day (QID) | INTRAMUSCULAR | Status: DC | PRN

## 2015-08-07 MED ORDER — PROSIGHT PO TABS
1.0000 | ORAL_TABLET | Freq: Two times a day (BID) | ORAL | Status: DC
  Administered 2015-08-07 – 2015-08-09 (×4): 1 via ORAL
  Filled 2015-08-07 (×5): qty 1

## 2015-08-07 MED ORDER — INSULIN ASPART 100 UNIT/ML ~~LOC~~ SOLN: 0.0000 [IU] | Freq: Three times a day (TID) | SUBCUTANEOUS | Status: DC

## 2015-08-07 MED ORDER — INSULIN GLARGINE 100 UNIT/ML ~~LOC~~ SOLN
15.0000 [IU] | Freq: Every day | SUBCUTANEOUS | Status: DC
  Administered 2015-08-08 – 2015-08-09 (×2): 15 [IU] via SUBCUTANEOUS
  Filled 2015-08-07 (×2): qty 0.15

## 2015-08-07 MED ORDER — IPRATROPIUM-ALBUTEROL 0.5-2.5 (3) MG/3ML IN SOLN
3.0000 mL | RESPIRATORY_TRACT | Status: DC
  Administered 2015-08-07: 3 mL via RESPIRATORY_TRACT
  Filled 2015-08-07: qty 3

## 2015-08-07 MED ORDER — HEPARIN BOLUS VIA INFUSION: 2800.0000 [IU] | Freq: Once | INTRAVENOUS | Status: DC

## 2015-08-07 MED ORDER — WARFARIN - PHARMACIST DOSING INPATIENT: Freq: Every day | Status: DC

## 2015-08-07 MED ORDER — PREDNISONE 20 MG PO TABS
40.0000 mg | ORAL_TABLET | Freq: Every day | ORAL | Status: DC
  Administered 2015-08-08 – 2015-08-09 (×2): 40 mg via ORAL
  Filled 2015-08-07 (×3): qty 2

## 2015-08-07 MED ORDER — NAPHAZOLINE-GLYCERIN 0.03-0.5 % OP SOLN
1.0000 [drp] | Freq: Every day | OPHTHALMIC | Status: DC
  Administered 2015-08-07 – 2015-08-09 (×3): 1 [drp] via OPHTHALMIC
  Filled 2015-08-07 (×8): qty 15

## 2015-08-07 MED ORDER — ACETAMINOPHEN 325 MG PO TABS: 650.0000 mg | ORAL_TABLET | ORAL | Status: DC | PRN

## 2015-08-07 MED ORDER — LORATADINE 10 MG PO TABS
10.0000 mg | ORAL_TABLET | Freq: Every day | ORAL | Status: DC
  Administered 2015-08-08 – 2015-08-09 (×2): 10 mg via ORAL
  Filled 2015-08-07 (×2): qty 1

## 2015-08-07 MED ORDER — CARBIDOPA-LEVODOPA 25-250 MG PO TABS
1.0000 | ORAL_TABLET | Freq: Three times a day (TID) | ORAL | Status: DC
  Administered 2015-08-07 – 2015-08-09 (×4): 1 via ORAL
  Filled 2015-08-07 (×7): qty 1

## 2015-08-07 MED ORDER — LISINOPRIL 20 MG PO TABS
40.0000 mg | ORAL_TABLET | Freq: Every day | ORAL | Status: DC
Start: 2015-08-07 — End: 2015-08-07

## 2015-08-07 MED ORDER — INSULIN ASPART 100 UNIT/ML ~~LOC~~ SOLN: 0.0000 [IU] | Freq: Every day | SUBCUTANEOUS | Status: DC

## 2015-08-07 MED ORDER — BUDESONIDE 0.25 MG/2ML IN SUSP
0.2500 mg | Freq: Two times a day (BID) | RESPIRATORY_TRACT | Status: DC
  Administered 2015-08-07 – 2015-08-09 (×4): 0.25 mg via RESPIRATORY_TRACT
  Filled 2015-08-07 (×4): qty 2

## 2015-08-07 MED ORDER — WARFARIN SODIUM 2 MG PO TABS: 4.0000 mg | ORAL_TABLET | Freq: Once | ORAL | Status: DC

## 2015-08-07 MED ORDER — LORATADINE 10 MG PO TABS: 10.0000 mg | ORAL_TABLET | Freq: Every day | ORAL | Status: DC

## 2015-08-07 MED ORDER — LISINOPRIL 20 MG PO TABS
20.0000 mg | ORAL_TABLET | Freq: Every day | ORAL | Status: DC
  Administered 2015-08-07 – 2015-08-08 (×2): 20 mg via ORAL
  Filled 2015-08-07 (×2): qty 1

## 2015-08-07 MED ORDER — POLYETHYLENE GLYCOL 3350 17 G PO PACK
17.0000 g | PACK | Freq: Every day | ORAL | Status: DC
  Administered 2015-08-08 – 2015-08-09 (×2): 17 g via ORAL
  Filled 2015-08-07 (×2): qty 1

## 2015-08-07 MED ORDER — CETYLPYRIDINIUM CHLORIDE 0.05 % MT LIQD
7.0000 mL | Freq: Two times a day (BID) | OROMUCOSAL | Status: DC
  Administered 2015-08-07 – 2015-08-08 (×2): 7 mL via OROMUCOSAL

## 2015-08-07 MED ORDER — CARBIDOPA-LEVODOPA 25-250 MG PO TABS: 1.0000 | ORAL_TABLET | Freq: Three times a day (TID) | ORAL | Status: DC

## 2015-08-07 MED ORDER — OCUVITE-LUTEIN PO CAPS: 1.0000 | ORAL_CAPSULE | Freq: Two times a day (BID) | ORAL | Status: DC

## 2015-08-07 MED ORDER — HEPARIN (PORCINE) IN NACL 100-0.45 UNIT/ML-% IJ SOLN
1400.0000 [IU]/h | INTRAMUSCULAR | Status: DC
  Administered 2015-08-07: 1200 [IU]/h via INTRAVENOUS
  Filled 2015-08-07: qty 250

## 2015-08-07 MED ORDER — IPRATROPIUM-ALBUTEROL 0.5-2.5 (3) MG/3ML IN SOLN: 3.0000 mL | Freq: Four times a day (QID) | RESPIRATORY_TRACT | Status: DC

## 2015-08-07 MED ORDER — INSULIN ASPART 100 UNIT/ML ~~LOC~~ SOLN
0.0000 [IU] | Freq: Three times a day (TID) | SUBCUTANEOUS | Status: DC
Start: 2015-08-08 — End: 2015-08-07

## 2015-08-07 MED ORDER — ATROPINE SULFATE 0.1 MG/ML IJ SOLN: 0.5000 mg | INTRAMUSCULAR | Status: DC | PRN

## 2015-08-07 NOTE — Progress Notes (Signed)
ANTICOAGULATION CONSULT NOTE - FOLLOW UP   Pharmacy Consult for Warfarin  Indication: atrial fibrillation  No Known Allergies  Patient Measurements: Height: 5\' 9"  (175.3 cm) Weight: 230 lb 1.6 oz (104.373 kg) IBW/kg (Calculated) : 70.7  Vital Signs: Temp: 98 F (36.7 C) (04/04 1109) Temp Source: Oral (04/04 0448) BP: 123/71 mmHg (04/04 1109) Pulse Rate: 90 (04/04 1109)  Labs:  Recent Labs  08/05/15 0543 08/06/15 0516 08/06/15 1016 08/07/15 0338  HGB 12.5*  --   --  13.0  HCT 38.0*  --   --  38.4*  PLT 166  --   --  163  LABPROT 34.0*  --  21.9* 18.7*  INR 3.45  --  1.92 1.56  HEPARINUNFRC  --   --   --  <0.10*  CREATININE 1.33* 1.05  --   --     Estimated Creatinine Clearance: 55.7 mL/min (by C-G formula based on Cr of 1.05).   Medical History: Past Medical History  Diagnosis Date  . Diabetes mellitus without complication (HCC)   . Hypertension   . UTI (lower urinary tract infection)   . Parkinson's disease (HCC)   . TIA (transient ischemic attack)   . Chronic atrial fibrillation (HCC)     a. on Couamdin; b. CHADS2VASc at least 7 (CHF, HTN, age x 2, DM, stroke x 2); c. s/p MAZE 1999  . Chronic systolic CHF (congestive heart failure) (HCC)     a. echo 08/01/2015: EF of 45%, mild LVH, mitral valve with annular calcification, mitral valve partially mobile  . Stroke (HCC)   . Hypothyroidism   . Normal coronary arteries     a. by cardiac cath 1999  . Mitral valve prolapse     a. s/p mitral valve repair 1999    Assessment: 82 yo male brought to hospital due to SOB and wheezing. Pharmacy has been consulted to monitor/manage patients warfarin for his chronic A. Fib.  Patients home dose:  Warfarin 3mg  T, Th, Sat  Warfarin 6mg  M,W,F, Sun   3/31 INR on admission: 3.64; held  4/1: INR : 3.53; HELD 4/2: INR= 3.45; held  4/3: INR=1.92 - 6mg  given 4/4: INR= 1.56   Goal of Therapy:  INR 2-3 Monitor platelets by anticoagulation protocol: Yes   Plan:  INR  resulted @ 1.52 today.  Patient was given 6mg  yesterday.  Will order warfarin 4mg  for 1800 today. - should begin to see rise in INR tomorrow.  INR ordered for 0500. Pharmacy will continue to follow and adjust per consult.   Patients INR on admission was supratherapeutic at 3.64, home dose most likely needs to be reduced. Suggest attempting a regimen of 4mg  daily for a total of 28mg  a week (a decrease from his home dose of 33mg  per week)   Cher NakaiSheema Trenise Turay, PharmD Pharmacy Resident  08/07/2015,3:49 PM

## 2015-08-07 NOTE — Progress Notes (Signed)
Inpatient Diabetes Program Recommendations  AACE/ADA: New Consensus Statement on Inpatient Glycemic Control (2015)  Target Ranges:  Prepandial:   less than 140 mg/dL      Peak postprandial:   less than 180 mg/dL (1-2 hours)      Critically ill patients:  140 - 180 mg/dL  Results for Troy [name], Troy [name] (MRN [id number]) as of 08/07/2015 09:57  Ref. Range 08/06/2015 07:41 08/06/2015 11:11 08/06/2015 14:41 08/06/2015 16:14 08/06/2015 20:48 08/07/2015 07:26  Glucose-Capillary Latest Ref Range: 65-99 mg/dL 782186 (H) 956232 (H) 213240 (H) 264 (H) 272 (H) 220 (H)   Review of Glycemic Control  Current orders for Inpatient glycemic control: Lantus 15 units daily, Novolog 0-20 units TID with meals, Novolog 0-5 units QHS  Inpatient Diabetes Program Recommendations: Insulin - Basal: Please consider increasing Lantus to 17 units daily. Insulin - Meal Coverage: If steroids are continued as ordered (Prednisone 40 mg QAM), please consider ordering Novolog 3 units TID with meals for meal coverage.   Thanks, Orlando PennerMarie Xavior Niazi, RN, MSN, CDE Diabetes Coordinator Inpatient Diabetes Program [phone] (Team Pager from 8am to 5pm) [phone] ([facility] office) [phone] [facility] office) [phone] [employer]([facility] office)

## 2015-08-07 NOTE — Progress Notes (Signed)
Patient is alert and oriented x 4. Denied any acute pain, no acute respiratory distress noted. Patient stripped off his leads x 2 and the zoll pads applied on day shift.  Patient intentionally discontinued his IV line and he bled small amount of bright red blood. The RN informed patient that she has to start another IV line. The patient indicated all that the staff is doing is not  is not necessary and as such, he's not going to consent to any more IV stick. He added the IV stick hurts. The RN explained to patient the rational for refusing IV stick and Heparin drip. Patient still refused after the  explanation was given by the RN.  Wife is at bedside and she tried to convince patient but patient did not listen to her.Dr. Loney Lohseni notified of the situation but no new order received. Will continue to monitor.

## 2015-08-07 NOTE — Progress Notes (Signed)
Md notified. Pts HR is dipping to the 20-30s and then going up as high as 120s, Pt is also having multiple pauses. Pt is symptomatic, he has a cold sweat and slow to respond to voice and commands. MD acknowledged and will see Pt. Cardiology will also round on patient.

## 2015-08-07 NOTE — Progress Notes (Signed)
Patient: Troy [name] / Admit Date: 08/03/2015 / Date of Encounter: 08/07/2015, 10:22 AM   Subjective: Agitated overnight, pulled out his IV and refused replacement. He was placed on IV heparin in preparation for possible PPM at [facility] given his pauses. Because he no longer has IV assess he is not adequately anticoagulated at this time. No further bradycardic rates or pauses seen on telemetry.   Review of Systems: Review of Systems  Constitutional: Negative.   Respiratory: Negative.   Cardiovascular: Negative.   Gastrointestinal: Negative.   Musculoskeletal: Negative.   Neurological: Negative.   Psychiatric/Behavioral: Negative.   All other systems reviewed and are negative. Patient has underlying memory issues, poor historian  Objective: Telemetry: Afib, 80's, no pauses seen on telemetry, rare episodes of NSVT 3-4 beats Physical Exam: Blood pressure 132/67, pulse 90, temperature 98.2 F (36.8 C), temperature source Oral, resp. rate 20, height  (1.753 m), weight 230 lb 1.6 oz (104.373 kg), SpO2 92 %. Body mass index is 33.96 kg/(m^2). General: Well developed, well nourished, in no acute distress. Head: Normocephalic, atraumatic, sclera non-icteric, no xanthomas, nares are without discharge. Neck: Negative for carotid bruits. JVP not elevated. Lungs: Clear bilaterally to auscultation without wheezes, rales, or rhonchi. Breathing is unlabored. Heart: Irregularly irregular S1 S2 without murmurs, rubs, or gallops.  Abdomen: Soft, non-tender, non-distended with normoactive bowel sounds. No rebound/guarding. Extremities: No clubbing or cyanosis. No edema. Distal pedal pulses are 2+ and equal bilaterally. Neuro: Alert and oriented X 3. Moves all extremities spontaneously. Psych:  Responds to questions appropriately with a normal affect.   Intake/Output Summary (Last 24 hours) at 08/07/15 1022 Last data filed at 08/07/15 0959  Gross per 24 hour  Intake 466.88 ml  Output      0  ml  Net 466.88 ml    Inpatient Medications:  . antiseptic oral rinse  7 mL Mouth Rinse BID  . budesonide (PULMICORT) nebulizer solution  0.25 mg Nebulization BID  . carbidopa-levodopa  1 tablet Oral TID  . furosemide  20 mg Intravenous Q12H  . insulin aspart  0-20 Units Subcutaneous TID WC  . insulin aspart  0-5 Units Subcutaneous QHS  . insulin glargine  15 Units Subcutaneous Daily  . ipratropium-albuterol  3 mL Nebulization Q4H  . lisinopril  40 mg Oral QHS  . loratadine  10 mg Oral Daily  . multivitamin-lutein  1 capsule Oral BID  . polyethylene glycol  17 g Oral Daily  . predniSONE  40 mg Oral Q breakfast  . senna-docusate  1 tablet Oral BID  . sodium chloride flush  3 mL Intravenous Q12H   Infusions:  . heparin Stopped (08/07/15 0330)    Labs:  Recent Labs  08/05/15 0543 08/06/15 0516  NA 133* 138  K 4.6 3.5  CL 99* 99*  CO2 27 32  GLUCOSE 348* 192*  BUN 41* 39*  CREATININE 1.33* 1.05  CALCIUM 8.4* 8.5*  MG 1.8 2.0   No results for input(s): AST, ALT, ALKPHOS, BILITOT, PROT, ALBUMIN in the last 72 hours.  Recent Labs  08/05/15 0543 08/07/15 0338  WBC 11.3* 10.1  HGB 12.5* 13.0  HCT 38.0* 38.4*  MCV 89.1 86.9  PLT 166 163   No results for input(s): CKTOTAL, CKMB, TROPONINI in the last 72 hours. Invalid input(s): POCBNP No results for input(s): HGBA1C in the last 72 hours.   Weights: Filed Weights   08/03/15 1429 08/04/15 1339  Weight: 232 lb (105.235 kg) 230 lb 1.6 oz (  104.373 kg)     Radiology/Studies:  Dg Chest 2 View  07/11/2015  CLINICAL DATA:  Shortness breath since 07/08/2015. EXAM: CHEST  2 VIEW COMPARISON:  Chest, abdomen and pelvis CT scan and single view of the chest 02/25/2015. FINDINGS: There is cardiomegaly and mild vascular congestion. Trace right pleural effusion is noted. No left pleural effusion pneumothorax. No consolidative process. Remote left rib fractures noted. IMPRESSION: Cardiomegaly and pulmonary vascular congestion. Trace  right pleural effusion. Electronically Signed   By: Drusilla Kannerhomas  Dalessio M.D.   On: 07/11/2015 16:30   Dg Chest Port 1 View  08/03/2015  CLINICAL DATA:  Cold x 2 weeks, shortness of breath today EXAM: PORTABLE CHEST 1 VIEW COMPARISON:  07/11/2015 FINDINGS: Severe cardiac enlargement stable. Moderate vascular congestion stable. No infiltrate or effusion. IMPRESSION: Stable cardiomegaly and vascular congestion with no acute findings Electronically Signed   By: Esperanza Heiraymond  Rubner M.D.   On: 08/03/2015 15:05     Assessment and Plan   1. Bradycardia with symptomatic pauses in the setting of chronic Afib: -Patient with HR into the 20's to 30's on the afternoon of 4/3 with associated pauses ranging from 4 to 9.9 seconds -Toprol XL held -Heart rate currently in the 80's, Afib, rare NSVT -Re-apply transcutaneous new pacing pads as he removed the anterior pad overnight 2/2 agitation -He is agreeable to have IV restarted   -Potassium is not elevated -TSH from 08/2014 normal, will add on thyroid studies to labs previously drawn -Magnesium 2.0 -Given that he has chronic Afib, and he is now with bradycardic rates with significant pauses he may be best suited with a PPM -[facility]. Given no further pauses on telemetry off metoprolol and patient is now asymptomatic can monitor on telemetry at this time. His heart rate is acceptable in the range on 80-low 100. If develops further pauses off metoprolol [facility]. Plan for 30 event monitor to evaluate for pauses off metoprolol    2. Chronic Afib: -Currently rate controlled -Toprol XL held as above -Coumadin has been held -CHADS2VASc at least 7 (CHF, HTN, age x 2, DM, stroke x 2) -IV self discontinued, he is agreeable to restart -Once IV is restarted, would restart heparin gtt  3. Acute on chronic respiratory failure: -Remains SOB -Does not appear grossly volume overloaded at this time -Wean O2 as able to home dosing  4.  Chronic systolic CHF: -Toprol XL held as above -Lisinopril   5. S/p mitral valve annuloplasty and repair: -Stable on most recent echo   Elinor DodgeSigned, Ryan Dunn, PA-C Pager: [phone] 08/07/2015, 10:22 AM   Attending Note Patient seen and examined, agree with detailed note above,  Patient presentation and plan discussed on rounds.   Very impressive pauses yesterday up to 9 seconds, Beta blocker held with no further arrhythmia overnight. Family denied that he was sleeping when he had his arrhythmia, deny that he has sleep apnea Patient was agitated overnight, pulled out his IV.   Long discussion with caretaker and wife this morning on rounds He appears to have AV nodal conduction disease, sick sinus syndrome Discussed case with Dr. Nicki ReaperVipal  Shah. Discussed case with EP this morning, they have recommended monitoring without [facility]. 30 day monitor discussed with family, they are in agreement. Nurse from our office as activated 30 day monitor and this will be placed at the time of discharge. Caretaker and wife are familiar with device, we will monitor him closely  Plan for discharge potentially tomorrow Family reports he is walking relatively well and can take care of himself with assistance at home  Greater than 50% was spent in counseling and coordination of care with patient Total encounter time 35 minutes or more   Signed: Dossie Arbour  M.D., Ph.D. [facility]

## 2015-08-07 NOTE — Progress Notes (Signed)
   Patient with tachy-brady syndrome with HR ranging from 140's to 20 bpm on telemetry. He has subsequently had recurrent prolonged pauses this afternoon of 8 seconds in length. Case discussed with Dr. Elberta Fortisamnitz, MD who will see patient and evaluate for PPM on 4/5. Admission orders ave been placed for [facility] including pacer pads and atropine to the bedside. Patient was signed out to Theodore Demarkhonda Barrett, PA-C who will evaluate patient upon [facility]. If temp wire is needed, [facility] given need for transport.   Eula Listenyan Kiefer Opheim, PA-C

## 2015-08-07 NOTE — Care Management (Signed)
Anticipate permanent pacemaker insertion due to bradycardia and pauses.

## 2015-08-07 NOTE — Discharge Summary (Signed)
[facility] [place] at [facility]   PATIENT NAME: Troy [name]    MR#:  [id number]  DATE OF BIRTH:  [birth year]  DATE OF ADMISSION:  08/03/2015 ADMITTING PHYSICIAN: Shaune Pollack, MD  DATE OF DISCHARGE: 08/07/2015  PRIMARY CARE PHYSICIAN: Barbette Reichmann, MD    ADMISSION DIAGNOSIS:  Dyspnea [R06.00] Chronic atrial fibrillation (HCC) [I48.2] Warfarin anticoagulation [Z79.01] Acute congestive heart failure, unspecified congestive heart failure type (HCC) [I50.9]  DISCHARGE DIAGNOSIS:  Active Problems:   CHF (congestive heart failure) (HCC) Symptomatic bradycardia and pauses in the setting of chronic a.fib  SECONDARY DIAGNOSIS:   Past Medical History  Diagnosis Date  . Diabetes mellitus without complication (HCC)   . Hypertension   . UTI (lower urinary tract infection)   . Parkinson's disease (HCC)   . TIA (transient ischemic attack)   . Chronic atrial fibrillation (HCC)     a. on Couamdin; b. CHADS2VASc at least 7 (CHF, HTN, age x 2, DM, stroke x 2); c. s/p MAZE 1999  . Chronic systolic CHF (congestive heart failure) (HCC)     a. echo 08/01/2015: EF of 45%, mild LVH, mitral valve with annular calcification, mitral valve partially mobile  . Stroke (HCC)   . Hypothyroidism   . Normal coronary arteries     a. by cardiac cath 1999  . Mitral valve prolapse     a. s/p mitral valve repair 1999    HOSPITAL COURSE:  82 y.o. male with a known history of diabetes type 2 without complication, essential hypertension, Parkinson's disease, previous history of TIA, chronic afibrillation, history of CHF, history of previous CVA, hypothyroidism who was admitted to the hospital due to shortness of breath, cough, wheezing.  * Acute on chronic respiratory failure with hypoxia-due to CHF and also underlying COPD. - IV Lasix 20 mg twice a day. Taper steroids, continue DuoNeb's on the clock, Pulmicort nebs. -Continue O2 supplementation and wean as tolerated.  *  Bradycardia with symptomatic pauses in the setting of chronic Afib: -Patient with HR into the 20's to 30's this afternoon with associated pauses ranging from 4 to 9 seconds -stopped toprol, [facility] as he's having recurrenty symptomatic bradycardia and pauses. - will likely need PPM  * CHF-acute on chronic systolic dysfunction. IV Lasix, follow I's and O's and daily weights. -holding metoprolol, continue lisinopril.  * COPD exacerbation-possibly due to acute bronchitis. continue prednisone, continue DuoNeb's on o'clock, Pulmicort nebs. Robitussin when necessary  * History of Parkinson's disease-continue Sinemet. Family concerned about more jerky movements - unsure if dose needs to be readjusted (family notes last dose up in Nov 2016)  * Diabetes, not well controlled, Continue sliding scale. Lantus 15 units subcutaneous daily at bedtime.  * Acute renal failure and hyponatremia. Possible due to Lasix. Decrease Lasix to 20 mg twice a day. Follow up BMP.   Considering his ongoing symptomatic bradycardia and pauses decision is being made to transfer him to [facility] for PPM per d/w Cardiology. Family is in agreement.   DISCHARGE CONDITIONS:   critical  CONSULTS OBTAINED:  Treatment Team:  Kym Groom, MD Iran Ouch, MD  DRUG ALLERGIES:  No Known Allergies  DISCHARGE MEDICATIONS:   Current Discharge Medication List    CONTINUE these medications which have CHANGED   Details  metoprolol succinate (TOPROL-XL) 25 MG 24 hr tablet Take 1 tablet (25 mg total) by mouth every evening. Take with or immediately following a meal. Qty: 30 tablet, Refills: 0  CONTINUE these medications which have NOT CHANGED   Details  albuterol (PROVENTIL HFA) 108 (90 Base) MCG/ACT inhaler Inhale 2 puffs into the lungs every 4 (four) hours as needed for wheezing or shortness of breath. Qty: 1 Inhaler, Refills: 0    carbidopa-levodopa (SINEMET IR) 25-250 MG  tablet Take 1 tablet by mouth 3 (three) times daily.    cetirizine (ZYRTEC) 10 MG tablet Take 10 mg by mouth daily.    furosemide (LASIX) 40 MG tablet Take 40 mg by mouth daily.    glipiZIDE (GLUCOTROL) 10 MG tablet Take 5-10 mg by mouth 2 (two) times daily. Pt takes one tablet in the morning and one-half tablet in the evening.    insulin regular human CONCENTRATED (HUMULIN R) 500 UNIT/ML injection Inject 4-6 Units into the skin 2 (two) times daily with a meal. Pt uses four units with breakfast and six units with dinner.    lisinopril (PRINIVIL,ZESTRIL) 40 MG tablet Take 40 mg by mouth at bedtime.     Multiple Vitamins-Minerals (PRESERVISION AREDS 2) CAPS Take 1 capsule by mouth 2 (two) times daily.    saxagliptin HCl (ONGLYZA) 2.5 MG TABS tablet Take 5 mg by mouth daily.    Tetrahydroz-Dextran-PEG-Povid (EYE DROPS ADVANCED RELIEF) 0.05-0.1-1-1 % SOLN Place 1 drop into both eyes daily.    !! warfarin (COUMADIN) 3 MG tablet Take 3 mg by mouth every evening. Pt takes on Tuesday, Thursday, and Saturday.    !! warfarin (COUMADIN) 6 MG tablet Take 6 mg by mouth every evening. Pt takes on Monday, Wednesday, Friday, and Sunday.     !! - Potential duplicate medications found. Please discuss with provider.       DISCHARGE INSTRUCTIONS:    DIET:  Cardiac diet  DISCHARGE CONDITION:  Serious  ACTIVITY:  Bedrest  OXYGEN:  Home Oxygen: No.   Oxygen Delivery: room air  DISCHARGE LOCATION:  [place]   If you experience worsening of your admission symptoms, develop shortness of breath, life threatening emergency, suicidal or homicidal thoughts you must seek medical attention immediately by calling 911 or calling your MD immediately  if symptoms less severe.  You Must read complete instructions/literature along with all the possible adverse reactions/side effects for all the Medicines you take and that have been prescribed to you. Take any new Medicines after you have completely understood  and accpet all the possible adverse reactions/side effects.   Please note  You were cared for by a hospitalist during your hospital stay. If you have any questions about your discharge medications or the care you received while you were in the hospital after you are discharged, you can call the unit and asked to speak with the hospitalist on call if the hospitalist that took care of you is not available. Once you are discharged, your primary care physician will handle any further medical issues. Please note that NO REFILLS for any discharge medications will be authorized once you are discharged, as it is imperative that you return to your primary care physician (or establish a relationship with a primary care physician if you do not have one) for your aftercare needs so that they can reassess your need for medications and monitor your lab values.    On the day of Discharge:  VITAL SIGNS:  Blood pressure 125/93, pulse 106, temperature 98.2 F (36.8 C), temperature source Oral, resp. rate 18, height 5\' 9"  (1.753 m), weight 104.373 kg (230 lb 1.6 oz), SpO2 92 %.  PHYSICAL EXAMINATION:  GENERAL:  82  y.o.-year-old patient lying in the bed with no acute distress.  EYES: Pupils equal, round, reactive to light and accommodation. No scleral icterus. Extraocular muscles intact.  HEENT: Head atraumatic, normocephalic. Oropharynx and nasopharynx clear.  NECK:  Supple, no jugular venous distention. No thyroid enlargement, no tenderness.  LUNGS: Normal breath sounds bilaterally, no wheezing, rales,rhonchi or crepitation. No use of accessory muscles of respiration.  CARDIOVASCULAR: S1, S2 normal. No murmurs, rubs, or gallops.  ABDOMEN: Soft, non-tender, non-distended. Bowel sounds present. No organomegaly or mass.  EXTREMITIES: No pedal edema, cyanosis, or clubbing.  NEUROLOGIC: Cranial nerves II through XII are intact. Muscle strength 5/5 in all extremities. Sensation intact. Gait not checked.  PSYCHIATRIC:  The patient is alert and oriented x 3.  SKIN: No obvious rash, lesion, or ulcer.  DATA REVIEW:   CBC  Recent Labs Lab 08/07/15 0338  WBC 10.1  HGB 13.0  HCT 38.4*  PLT 163    Chemistries   Recent Labs Lab 08/03/15 1459  08/06/15 0516  NA 139  < > 138  K 3.8  < > 3.5  CL 104  < > 99*  CO2 29  < > 32  GLUCOSE 145*  < > 192*  BUN 15  < > 39*  CREATININE 0.84  < > 1.05  CALCIUM 8.3*  < > 8.5*  MG 1.9  < > 2.0  AST 19  --   --   ALT 7*  --   --   ALKPHOS 76  --   --   BILITOT 1.1  --   --   < > = values in this interval not displayed.  Cardiac Enzymes  Recent Labs Lab 08/04/15 0403  TROPONINI <0.03    Microbiology Results  Results for orders placed or performed during the hospital encounter of 08/03/15  Rapid Influenza A&B Antigens ([facility] only)     Status: None   Collection Time: 08/03/15  5:40 PM  Result Value Ref Range Status   Influenza A ([facility]) NEGATIVE NEGATIVE Final   Influenza B [facility]) NEGATIVE NEGATIVE Final    RADIOLOGY:  Ct Head Wo Contrast  08/07/2015  CLINICAL DATA:  Unresponsive. EXAM: CT HEAD WITHOUT CONTRAST TECHNIQUE: Contiguous axial images were obtained from the base of the skull through the vertex without intravenous contrast. COMPARISON:  03/14/2014, 02/25/2015 FINDINGS: Moderate to advanced atrophy stable. Negative for hydrocephalus. Ventricular enlargement consistent with atrophy Chronic microvascular ischemic changes in the white matter. Negative for acute infarct. Negative for intracranial hemorrhage. No intracranial mass lesion. Soft tissue mass in the anterior ethmoid sinus on both sides of the nasal septum. The mass measures 20 x 26 mm and has been present on prior studies with slow growth. The mass appears to penetrate through the nasal septum. This is most consistent with malignancy. Biopsy recommended. IMPRESSION: Atrophy and chronic microvascular ischemia. 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 Biopsy recommended Electronically Signed   By: Marlan Palau M.D.   On: 08/07/2015 12:44     Management plans discussed with the patient, family and they are in agreement.  CODE STATUS:     Code Status Orders        Start     Ordered   08/03/15 1659  Do not attempt resuscitation (DNR)   Continuous    Question Answer Comment  In the event of cardiac or respiratory ARREST Do not call a "code blue"   In the event of cardiac or  respiratory ARREST Do not perform Intubation, CPR, defibrillation or ACLS   In the event of cardiac or respiratory ARREST Use medication by any route, position, wound care, and other measures to relive pain and suffering. May use oxygen, suction and manual treatment of airway obstruction as needed for comfort.      08/03/15 1658    Code Status History    Date Active Date Inactive Code Status Order ID Comments User Context   02/25/2015  7:56 PM 03/01/2015  7:21 PM Full Code [id number]  Gale Journey, MD ED    Advance Directive Documentation        Most Recent Value   Type of Advance Directive  Healthcare Power of Attorney   Pre-existing out of facility DNR order (yellow form or pink MOST form)     "MOST" Form in Place?        TOTAL TIME TAKING CARE OF THIS PATIENT: 45 minutes.    [facility], Latayvia Mandujano M.D on 08/07/2015 at 5:32 PM  Between 7am to 6pm - Pager - [phone]  After 6pm go to [email or web address] - password [email or web address]  [facility]  Office  [phone]  CC: Primary care physician; Barbette Reichmann, MD   Note: This dictation was prepared with Dragon dictation along with smaller phrase technology. Any transcriptional errors that result from this process are unintentional.

## 2015-08-07 NOTE — Progress Notes (Signed)
30 day monitor cancelled per Dr. Mariah MillingGollan.  Monitor returned to office. [employer] notified.

## 2015-08-07 NOTE — Progress Notes (Signed)
[facility]. Transfer packet given to [facility].

## 2015-08-07 NOTE — Consult Note (Addendum)
ANTICOAGULATION CONSULT NOTE - Initial Consult  Pharmacy Consult for heparin drip Indication: atrial fibrillation  No Known Allergies  Patient Measurements: Height: 5\' 9"  (175.3 cm) Weight: 230 lb 1.6 oz (104.373 kg) IBW/kg (Calculated) : 70.7 Heparin Dosing Weight: 93.2kg  Vital Signs: Temp: 98.2 F (36.8 C) (04/04 0448) Temp Source: Oral (04/04 0448) BP: 132/67 mmHg (04/04 0448) Pulse Rate: 90 (04/04 0448)  Labs:  Recent Labs  08/05/15 0543 08/06/15 0516 08/06/15 1016 08/07/15 0338  HGB 12.5*  --   --  13.0  HCT 38.0*  --   --  38.4*  PLT 166  --   --  163  LABPROT 34.0*  --  21.9* 18.7*  INR 3.45  --  1.92 1.56  HEPARINUNFRC  --   --   --  <0.10*  CREATININE 1.33* 1.05  --   --     Estimated Creatinine Clearance: 55.7 mL/min (by C-G formula based on Cr of 1.05).   Medical History: Past Medical History  Diagnosis Date  . Diabetes mellitus without complication (HCC)   . Hypertension   . UTI (lower urinary tract infection)   . Parkinson's disease (HCC)   . TIA (transient ischemic attack)   . Chronic atrial fibrillation (HCC)     a. on Couamdin; b. CHADS2VASc at least 7 (CHF, HTN, age x 2, DM, stroke x 2); c. s/p MAZE 1999  . Chronic systolic CHF (congestive heart failure) (HCC)     a. echo 08/01/2015: EF of 45%, mild LVH, mitral valve with annular calcification, mitral valve partially mobile  . Stroke (HCC)   . Hypothyroidism   . Normal coronary arteries     a. by cardiac cath 1999  . Mitral valve prolapse     a. s/p mitral valve repair 1999    Medications:  Scheduled:  . antiseptic oral rinse  7 mL Mouth Rinse BID  . budesonide (PULMICORT) nebulizer solution  0.25 mg Nebulization BID  . carbidopa-levodopa  1 tablet Oral TID  . furosemide  20 mg Intravenous Q12H  . heparin  2,800 Units Intravenous Once  . insulin aspart  0-20 Units Subcutaneous TID WC  . insulin aspart  0-5 Units Subcutaneous QHS  . insulin glargine  15 Units Subcutaneous Daily  .  ipratropium-albuterol  3 mL Nebulization Q4H  . lisinopril  40 mg Oral QHS  . loratadine  10 mg Oral Daily  . multivitamin-lutein  1 capsule Oral BID  . polyethylene glycol  17 g Oral Daily  . predniSONE  40 mg Oral Q breakfast  . senna-docusate  1 tablet Oral BID  . sodium chloride flush  3 mL Intravenous Q12H    Assessment: Pt is a 82 year old male with a PMH of afib. Pt takes warfarin at home. INR is 1.92 today. Pt received dose this evening already. Pt had high grade AV block and long ventricular pauses on ECG. Pt is to have pacemaker placed. Per Dr. Kirke CorinArida, would like to convert pt to heparin drip. Since pt did receive warfarin dose today, has a INR very close to therapeutic will not bolus  Goal of Therapy:  Heparin level 0.3-0.7 units/ml Monitor platelets by anticoagulation protocol: Yes   Plan:  Start heparin infusion at 1350 units/hr Check anti-Xa level in 8 hours and daily while on heparin Continue to monitor H&H and platelets   4/4 03:30 heparin level <0.1. 2800 unit bolus and increase rate to 1700 units/hr. Recheck in 8 hours.  RN informed pharmacist the  patient has pulled out IV. Bolus d/c. She will d/c drip order.   Olene Floss, Pharm.D Clinical Pharmacist   08/07/2015,5:25 AM

## 2015-08-07 NOTE — Progress Notes (Signed)
ANTICOAGULATION CONSULT NOTE - Initial Consult  Pharmacy Consult for heparin Indication: atrial fibrillation  No Known Allergies  Patient Measurements: Height: 5\' 9"  (175.3 cm) Weight: 234 lb 2.1 oz (106.2 kg) IBW/kg (Calculated) : 70.7 Heparin Dosing Weight: 93 kg  Vital Signs: Temp: 98 F (36.7 C) (04/04 1930) Temp Source: Oral (04/04 1930) BP: 126/78 mmHg (04/04 1930) Pulse Rate: 85 (04/04 1930)  Labs:  Recent Labs  08/05/15 0543 08/06/15 0516 08/06/15 1016 08/07/15 0338  HGB 12.5*  --   --  13.0  HCT 38.0*  --   --  38.4*  PLT 166  --   --  163  LABPROT 34.0*  --  21.9* 18.7*  INR 3.45  --  1.92 1.56  HEPARINUNFRC  --   --   --  <0.10*  CREATININE 1.33* 1.05  --   --     Estimated Creatinine Clearance: 56.2 mL/min (by C-G formula based on Cr of 1.05).   Medical History: Past Medical History  Diagnosis Date  . Diabetes mellitus without complication (HCC)   . Hypertension   . UTI (lower urinary tract infection)   . Parkinson's disease (HCC)   . TIA (transient ischemic attack)   . Chronic atrial fibrillation (HCC)     a. on Couamdin; b. CHADS2VASc at least 7 (CHF, HTN, age x 2, DM, stroke x 2); c. s/p MAZE 1999  . Chronic systolic CHF (congestive heart failure) (HCC)     a. echo 08/01/2015: EF of 45%, mild LVH, mitral valve with annular calcification, mitral valve partially mobile  . Stroke (HCC)   . Hypothyroidism   . Normal coronary arteries     a. by cardiac cath 1999  . Mitral valve prolapse     a. s/p mitral valve repair 1999    Assessment: 82 [facility] [place]. [facility]. On Coumadin PTA for Afib. INR was 1.56 this am, currently being held. Pharmacy consulted to dose heparin for Afib. Hgb and plts ok.   Goal of Therapy:  Heparin level 0.3-0.7 units/ml Monitor platelets by anticoagulation protocol: Yes   Plan:  Start heparin gtt at 1,200 units/hr Check 8 hr HL Monitor daily HL, CBC,  s/s of bleed  Troy [name], PharmD, [facility] Clinical Pharmacist Pager [phone] 08/07/2015 7:58 PM

## 2015-08-07 NOTE — Consult Note (Signed)
Reason for Consult:PD Referring Physician: Sherryll Burger  CC: Increased tremor  HPI: Troy [name] is an 82 y.o. male with an episode of jerking, eyes rolling back into his head and jerking movements associated with a 10 second pause on telemetry.  Patient now at baseline.  Family and caregiver report that the patient had a change in his PD medications at the end of last year and he has done well since that time.   Past Medical History  Diagnosis Date  . Diabetes mellitus without complication (HCC)   . Hypertension   . UTI (lower urinary tract infection)   . Parkinson's disease (HCC)   . TIA (transient ischemic attack)   . Chronic atrial fibrillation (HCC)     a. on Couamdin; b. CHADS2VASc at least 7 (CHF, HTN, age x 2, DM, stroke x 2); c. s/p MAZE 1999  . Chronic systolic CHF (congestive heart failure) (HCC)     a. echo 08/01/2015: EF of 45%, mild LVH, mitral valve with annular calcification, mitral valve partially mobile  . Stroke (HCC)   . Hypothyroidism   . Normal coronary arteries     a. by cardiac cath 1999  . Mitral valve prolapse     a. s/p mitral valve repair 1999    Past Surgical History  Procedure Laterality Date  . Cardiac surgery    . Cholecystectomy      Family History  Problem Relation Age of Onset  . Hypertension Mother   . Stroke Mother   . CAD Father   . Lymphoma Sister   . Lung disease Brother     Social History:  reports that he has quit smoking. His smoking use included Cigarettes. He has a 80 pack-year smoking history. He has never used smokeless tobacco. He reports that he does not drink alcohol or use illicit drugs.  No Known Allergies  Medications:  I have reviewed the patient's current medications. Prior to Admission:  Prescriptions prior to admission  Medication Sig Dispense Refill Last Dose  . albuterol (PROVENTIL HFA) 108 (90 Base) MCG/ACT inhaler Inhale 2 puffs into the lungs every 4 (four) hours as needed for wheezing or shortness of breath. 1  Inhaler 0 prn at prn   . carbidopa-levodopa (SINEMET IR) 25-250 MG tablet Take 1 tablet by mouth 3 (three) times daily.   08/03/2015 at 0800  . cetirizine (ZYRTEC) 10 MG tablet Take 10 mg by mouth daily.   08/03/2015 at 0800  . furosemide (LASIX) 40 MG tablet Take 40 mg by mouth daily.   08/03/2015 at 0800  . glipiZIDE (GLUCOTROL) 10 MG tablet Take 5-10 mg by mouth 2 (two) times daily. Pt takes one tablet in the morning and one-half tablet in the evening.   08/03/2015 at 0800  . insulin regular human CONCENTRATED (HUMULIN R) 500 UNIT/ML injection Inject 4-6 Units into the skin 2 (two) times daily with a meal. Pt uses four units with breakfast and six units with dinner.   08/03/2015 at 0800  . lisinopril (PRINIVIL,ZESTRIL) 40 MG tablet Take 40 mg by mouth at bedtime.    08/02/2015 at Unknown time  . Multiple Vitamins-Minerals (PRESERVISION AREDS 2) CAPS Take 1 capsule by mouth 2 (two) times daily.   08/03/2015 at 0800  . saxagliptin HCl (ONGLYZA) 2.5 MG TABS tablet Take 5 mg by mouth daily.   08/03/2015 at 0800  . Tetrahydroz-Dextran-PEG-Povid (EYE DROPS ADVANCED RELIEF) 0.05-0.1-1-1 % SOLN Place 1 drop into both eyes daily.   08/03/2015 at 0800  .  warfarin (COUMADIN) 3 MG tablet Take 3 mg by mouth every evening. Pt takes on Tuesday, Thursday, and Saturday.   08/02/2015 at Unknown time  . warfarin (COUMADIN) 6 MG tablet Take 6 mg by mouth every evening. Pt takes on Monday, Wednesday, Friday, and Sunday.   08/02/2015 at Unknown time  . [DISCONTINUED] metoprolol succinate (TOPROL-XL) 50 MG 24 hr tablet Take 50 mg by mouth every evening. Take with or immediately following a meal.   08/02/2015 at 1800   Scheduled: . antiseptic oral rinse  7 mL Mouth Rinse BID  . budesonide (PULMICORT) nebulizer solution  0.25 mg Nebulization BID  . carbidopa-levodopa  1 tablet Oral TID  . furosemide  20 mg Intravenous Q12H  . insulin aspart  0-20 Units Subcutaneous TID WC  . insulin aspart  0-5 Units Subcutaneous QHS  . insulin  glargine  15 Units Subcutaneous Daily  . ipratropium-albuterol  3 mL Nebulization Q4H  . lisinopril  40 mg Oral QHS  . loratadine  10 mg Oral Daily  . multivitamin-lutein  1 capsule Oral BID  . polyethylene glycol  17 g Oral Daily  . predniSONE  40 mg Oral Q breakfast  . senna-docusate  1 tablet Oral BID  . sodium chloride flush  3 mL Intravenous Q12H    ROS: History obtained from the patient  General ROS: negative for - chills, fatigue, fever, night sweats, weight gain or weight loss Psychological ROS: negative for - behavioral disorder, hallucinations, memory difficulties, mood swings or suicidal ideation Ophthalmic ROS: negative for - blurry vision, double vision, eye pain or loss of vision ENT ROS: negative for - epistaxis, nasal discharge, oral lesions, sore throat, tinnitus or vertigo Allergy and Immunology ROS: negative for - hives or itchy/watery eyes Hematological and Lymphatic ROS: negative for - bleeding problems, bruising or swollen lymph nodes Endocrine ROS: negative for - galactorrhea, hair pattern changes, polydipsia/polyuria or temperature intolerance Respiratory ROS: negative for - cough, hemoptysis, shortness of breath or wheezing Cardiovascular ROS: negative for - chest pain, dyspnea on exertion, edema or irregular heartbeat Gastrointestinal ROS: negative for - abdominal pain, diarrhea, hematemesis, nausea/vomiting or stool incontinence Genito-Urinary ROS: negative for - dysuria, hematuria, incontinence or urinary frequency/urgency Musculoskeletal ROS: negative for - joint swelling or muscular weakness Neurological ROS: as noted in HPI Dermatological ROS: negative for rash and skin lesion changes  Physical Examination: Blood pressure 132/67, pulse 90, temperature 98.2 F (36.8 C), temperature source Oral, resp. rate 20, height 5\' 9"  (1.753 m), weight 104.373 kg (230 lb 1.6 oz), SpO2 92 %.  HEENT-  Normocephalic, no lesions, without obvious abnormality.  Normal  external eye and conjunctiva.  Normal TM's bilaterally.  Normal auditory canals and external ears. Normal external nose, mucus membranes and septum.  Normal pharynx. Cardiovascular- S1, S2 normal, pulses palpable throughout   Lungs- chest clear, no wheezing, rales, normal symmetric air entry Abdomen- soft, non-tender; bowel sounds normal; no masses,  no organomegaly Lymph-no adenopathy palpable Musculoskeletal-no joint tenderness, deformity or swelling Skin-warm and dry, no hyperpigmentation, vitiligo, or suspicious lesions  Neurological Examination Mental Status: Alert.  Able to follow 3 step commands without difficulty. Cranial Nerves: II: Discs flat bilaterally; Visual fields grossly normal, pupils equal, round, reactive to light and accommodation III,IV, VI: ptosis not present, extra-ocular motions intact bilaterally V,VII: smile symmetric, facial light touch sensation normal bilaterally VIII: hearing normal bilaterally IX,X: gag reflex present XI: bilateral shoulder shrug XII: midline tongue extension Motor: Right : Upper extremity   5/5  Left:     Upper extremity   5/5  Lower extremity   5/5     Lower extremity   5/5 Minimal tremor noted.  Normal tone. Cerebellar: Normal finger-to-nose.   Laboratory Studies:   Basic Metabolic Panel:  Recent Labs Lab 08/03/15 1459 08/04/15 0458 08/05/15 0543 08/06/15 0516  NA 139 137 133* 138  K 3.8 4.2 4.6 3.5  CL 104 100* 99* 99*  CO2 32  GLUCOSE 145* 332* 348* 192*  BUN 15 19 41* 39*  CREATININE 0.84 1.12 1.33* 1.05  CALCIUM 8.3* 8.2* 8.4* 8.5*  MG 1.9  --  1.8 2.0    Liver Function Tests:  Recent Labs Lab 08/03/15 1459  AST 19  ALT 7*  ALKPHOS 76  BILITOT 1.1  PROT 7.1  ALBUMIN 3.4*   No results for input(s): LIPASE, AMYLASE in the last 168 hours. No results for input(s): AMMONIA in the last 168 hours.  CBC:  Recent Labs Lab 08/03/15 1459 08/04/15 0458 08/05/15 0543 08/07/15 0338  WBC 7.2 7.1  11.3* 10.1  NEUTROABS 4.6  --   --   --   HGB 13.2 12.9* 12.5* 13.0  HCT 40.2 38.5* 38.0* 38.4*  MCV 88.0 90.4 89.1 86.9  PLT 158 150 166 163    Cardiac Enzymes:  Recent Labs Lab 08/03/15 1459 08/03/15 2307 08/04/15 0120 08/04/15 0403  TROPONINI <0.03 <0.03 <0.03 <0.03    BNP: Invalid input(s): POCBNP  CBG:  Recent Labs Lab 08/06/15 1111 08/06/15 1441 08/06/15 1614 08/06/15 2048 08/07/15 0726  GLUCAP 232* 240* 264* 272* 220*    Microbiology: Results for orders placed or performed during the hospital encounter of 08/03/15  Rapid Influenza A&B Antigens ([facility] only)     Status: None   Collection Time: 08/03/15  5:40 PM  Result Value Ref Range Status   Influenza A ([facility]) NEGATIVE NEGATIVE Final   Influenza B ([facility]) NEGATIVE NEGATIVE Final    Coagulation Studies:  Recent Labs  08/05/15 0543 08/06/15 1016 08/07/15 0338  LABPROT 34.0* 21.9* 18.7*  INR 3.45 1.92 1.56    Urinalysis:  Recent Labs Lab 08/04/15 1459  COLORURINE YELLOW*  LABSPEC 1.015  PHURINE 5.0  GLUCOSEU >500*  HGBUR NEGATIVE  BILIRUBINUR NEGATIVE  KETONESUR TRACE*  PROTEINUR 30*  NITRITE NEGATIVE  LEUKOCYTESUR NEGATIVE    Lipid Panel:  No results found for: CHOL, TRIG, HDL, CHOLHDL, VLDL, LDLCALC  HgbA1C:  Lab Results  Component Value Date   HGBA1C 9.8* 02/25/2015    Urine Drug Screen:  No results found for: LABOPIA, COCAINSCRNUR, LABBENZ, AMPHETMU, THCU, LABBARB  Alcohol Level: No results for input(s): ETH in the last 168 hours.  Other results: EKG: atrial fibrillation, rate 86 bpm.  Imaging: No results found.   Assessment/Plan: 82 year old male who experienced an episode of unresponsiveness associated with a telemetry pause.  PD stable.  Doubt epileptic event.  Anticonvulsant therapy not indicated.  Due to elevated PT would image with noncontrast CT to rule out small hemorrhage.    Recommendations: 1.  Head CT without contrast  Thana Farr,  MD Neurology [phone] 08/07/2015, 11:09 AM

## 2015-08-07 NOTE — Progress Notes (Addendum)
Contacted by nurses this evening that Mr. Paddock had numerous episodes of bradycardia, pauses up to 8 or 9 seconds. On review of telemetry, I printed out many of the events showing frequent episodes of profound bradycardia with heart rates of 30, frequent episodes of pauses up to 8 seconds. He was symptomatic per the family at the bedside, with near syncope. He has been in bed the entire time. While I was in the room he did have bradycardia down to 30 bpm, reported feeling like he was going to "go out". Reported having profound lightheadedness  Currently without IV access as he pulled his access out earlier secondary to agitation Long discussion with caretaker and wife at the bedside recommended that we will likely need transfer to [place] for pacemaker. Family is in agreement and willing to proceed.  Recommend nurses try to obtain IV access, place pacer pads, atropine to the bedside. Could potentially start dopamine overnight once he reaches [place]. If unstable or if he continues to pull out his IV access, he may require urgent pacemaker.  I returned the 30 day monitor back to the office. Previously this is provided to the patient to be worn at the time of discharge.  We have contacted the on-call team at [facility], transfer orders have been discussed and placed, accepting team aware of the situation.   Total encounter time more than 35 minutes  Greater than 50% was spent in counseling and coordination of care with the patient  

## 2015-08-07 NOTE — Progress Notes (Signed)
Dr. Mariah MillingGollan ordered 30 day cardiac event monitor for near syncope and 9 sec pause on telemetry. [facility] sched to be d/c'd tomorrow, but would like education on monitor today. Went to pt's room and instructed pt's wife, Troy [name], as well as caregiver [name] on application and use of 30 day monitor. Advised them to place and activate monitor once he is d/c'd and to call the office w/ any questions or concerns.  They are appreciative and will let us know if we can be of further assistance.

## 2015-08-07 NOTE — Progress Notes (Signed)
MD notified for modification of CBGs now ordered every six hours, to discontinue heparin IV order and to let MD know patient has no IV access. Orders received. I will continue to assess.

## 2015-08-08 ENCOUNTER — Encounter (HOSPITAL_COMMUNITY)
Admission: AD | Disposition: A | Discharge status: Home / Self Care | Payer: Self-pay | Source: Other Acute Inpatient Hospital | Attending: Cardiology

## 2015-08-08 ENCOUNTER — Encounter (HOSPITAL_COMMUNITY): Payer: Self-pay | Admitting: Cardiology

## 2015-08-08 ENCOUNTER — Ambulatory Visit (HOSPITAL_COMMUNITY): Admit: 2015-08-08 | Payer: Self-pay | Admitting: Cardiology

## 2015-08-08 DIAGNOSIS — I495 Sick sinus syndrome: Secondary | ICD-10-CM

## 2015-08-08 HISTORY — PX: EP IMPLANTABLE DEVICE: SHX172B

## 2015-08-08 HISTORY — PX: CARDIAC CATHETERIZATION: SHX172

## 2015-08-08 LAB — CBC WITH DIFFERENTIAL/PLATELET
BASOS ABS: 0 10*3/uL (ref 0.0–0.1)
Basophils Relative: 0 %
Eosinophils Absolute: 0 10*3/uL (ref 0.0–0.7)
Eosinophils Relative: 0 %
HCT: 42.3 % (ref 39.0–52.0)
HEMOGLOBIN: 13.2 g/dL (ref 13.0–17.0)
LYMPHS ABS: 0.8 10*3/uL (ref 0.7–4.0)
LYMPHS PCT: 8 %
MCH: 28.3 pg (ref 26.0–34.0)
MCHC: 31.2 g/dL (ref 30.0–36.0)
MCV: 90.8 fL (ref 78.0–100.0)
Monocytes Absolute: 0.8 10*3/uL (ref 0.1–1.0)
Monocytes Relative: 9 %
NEUTROS PCT: 83 %
Neutro Abs: 7.8 10*3/uL — ABNORMAL HIGH (ref 1.7–7.7)
Platelets: 192 10*3/uL (ref 150–400)
RBC: 4.66 MIL/uL (ref 4.22–5.81)
RDW: 14.6 % (ref 11.5–15.5)
WBC: 9.4 10*3/uL (ref 4.0–10.5)

## 2015-08-08 LAB — COMPREHENSIVE METABOLIC PANEL
ALK PHOS: 78 U/L (ref 38–126)
ALT: 18 U/L (ref 17–63)
AST: 33 U/L (ref 15–41)
Albumin: 3.3 g/dL — ABNORMAL LOW (ref 3.5–5.0)
Anion gap: 11 (ref 5–15)
BILIRUBIN TOTAL: 1.1 mg/dL (ref 0.3–1.2)
BUN: 28 mg/dL — AB (ref 6–20)
CALCIUM: 9.2 mg/dL (ref 8.9–10.3)
CO2: 33 mmol/L — ABNORMAL HIGH (ref 22–32)
CREATININE: 1.25 mg/dL — AB (ref 0.61–1.24)
Chloride: 94 mmol/L — ABNORMAL LOW (ref 101–111)
GFR calc Af Amer: 57 mL/min — ABNORMAL LOW (ref >60)
GFR, EST NON AFRICAN AMERICAN: 49 mL/min — AB (ref >60)
GLUCOSE: 301 mg/dL — AB (ref 65–99)
POTASSIUM: 4.8 mmol/L (ref 3.5–5.1)
Sodium: 138 mmol/L (ref 135–145)
TOTAL PROTEIN: 7.2 g/dL (ref 6.5–8.1)

## 2015-08-08 LAB — PROTIME-INR
INR: 1.45 (ref 0.00–1.49)
Prothrombin Time: 17.8 seconds — ABNORMAL HIGH (ref 11.6–15.2)

## 2015-08-08 LAB — GLUCOSE, CAPILLARY
Glucose-Capillary: 249 mg/dL — ABNORMAL HIGH (ref 65–99)
Glucose-Capillary: 227 mg/dL — ABNORMAL HIGH (ref 65–99)
Glucose-Capillary: 250 mg/dL — ABNORMAL HIGH (ref 65–99)
Glucose-Capillary: 337 mg/dL — ABNORMAL HIGH (ref 65–99)

## 2015-08-08 LAB — BRAIN NATRIURETIC PEPTIDE: B Natriuretic Peptide: 58.7 pg/mL (ref 0.0–100.0)

## 2015-08-08 LAB — HEPARIN LEVEL (UNFRACTIONATED): Heparin Unfractionated: 0.14 IU/mL — ABNORMAL LOW (ref 0.30–0.70)

## 2015-08-08 LAB — MAGNESIUM: MAGNESIUM: 2.2 mg/dL (ref 1.7–2.4)

## 2015-08-08 LAB — DIGOXIN LEVEL: Digoxin Level: <0.2 ng/mL — ABNORMAL LOW (ref 0.8–2.0)

## 2015-08-08 LAB — TSH: TSH: 0.986 u[IU]/mL (ref 0.350–4.500)

## 2015-08-08 LAB — APTT: APTT: 31 s (ref 24–37)

## 2015-08-08 SURGERY — PACEMAKER IMPLANT

## 2015-08-08 SURGERY — TEMPORARY PACEMAKER

## 2015-08-08 MED ORDER — CEFAZOLIN SODIUM-DEXTROSE 2-3 GM-% IV SOLR
INTRAVENOUS | Status: AC
  Filled 2015-08-08: qty 50

## 2015-08-08 MED ORDER — FENTANYL CITRATE (PF) 100 MCG/2ML IJ SOLN
INTRAMUSCULAR | Status: AC
  Filled 2015-08-08: qty 2

## 2015-08-08 MED ORDER — SODIUM CHLORIDE 0.9 % IV SOLN
INTRAVENOUS | Status: DC
  Administered 2015-08-08: 15:00:00 via INTRAVENOUS

## 2015-08-08 MED ORDER — ACETAMINOPHEN 325 MG PO TABS: 325.0000 mg | ORAL_TABLET | ORAL | Status: DC | PRN

## 2015-08-08 MED ORDER — CEFAZOLIN SODIUM-DEXTROSE 2-4 GM/100ML-% IV SOLN
2.0000 g | INTRAVENOUS | Status: AC
  Administered 2015-08-08: 2 g via INTRAVENOUS
  Filled 2015-08-08: qty 100

## 2015-08-08 MED ORDER — LIDOCAINE HCL (PF) 1 % IJ SOLN
INTRAMUSCULAR | Status: DC | PRN
  Administered 2015-08-08: 20 mL

## 2015-08-08 MED ORDER — INSULIN ASPART 100 UNIT/ML ~~LOC~~ SOLN
0.0000 [IU] | Freq: Three times a day (TID) | SUBCUTANEOUS | Status: DC
  Administered 2015-08-09: 3 [IU] via SUBCUTANEOUS

## 2015-08-08 MED ORDER — IPRATROPIUM-ALBUTEROL 0.5-2.5 (3) MG/3ML IN SOLN
3.0000 mL | Freq: Two times a day (BID) | RESPIRATORY_TRACT | Status: DC
  Administered 2015-08-08 – 2015-08-09 (×3): 3 mL via RESPIRATORY_TRACT
  Filled 2015-08-08 (×3): qty 3

## 2015-08-08 MED ORDER — CEFAZOLIN SODIUM 1-5 GM-% IV SOLN
1.0000 g | Freq: Four times a day (QID) | INTRAVENOUS | Status: AC
  Administered 2015-08-08 – 2015-08-09 (×3): 1 g via INTRAVENOUS
  Filled 2015-08-08 (×4): qty 50

## 2015-08-08 MED ORDER — HEPARIN (PORCINE) IN NACL 2-0.9 UNIT/ML-% IJ SOLN
INTRAMUSCULAR | Status: AC
  Filled 2015-08-08: qty 500

## 2015-08-08 MED ORDER — LIDOCAINE HCL (PF) 1 % IJ SOLN
INTRAMUSCULAR | Status: AC
  Filled 2015-08-08: qty 30

## 2015-08-08 MED ORDER — CHLORHEXIDINE GLUCONATE 4 % EX LIQD
60.0000 mL | Freq: Once | CUTANEOUS | Status: AC
  Administered 2015-08-08: 4 via TOPICAL

## 2015-08-08 MED ORDER — SODIUM CHLORIDE 0.9 % IV SOLN
INTRAVENOUS | Status: DC
  Administered 2015-08-08: 20:00:00 via INTRAVENOUS

## 2015-08-08 MED ORDER — SODIUM CHLORIDE 0.9% FLUSH
3.0000 mL | Freq: Two times a day (BID) | INTRAVENOUS | Status: DC
  Administered 2015-08-08: 3 mL via INTRAVENOUS

## 2015-08-08 MED ORDER — HEPARIN (PORCINE) IN NACL 2-0.9 UNIT/ML-% IJ SOLN
INTRAMUSCULAR | Status: DC | PRN
  Administered 2015-08-08: 16:00:00

## 2015-08-08 MED ORDER — SODIUM CHLORIDE 0.9% FLUSH: 3.0000 mL | INTRAVENOUS | Status: DC | PRN

## 2015-08-08 MED ORDER — HEPARIN (PORCINE) IN NACL 2-0.9 UNIT/ML-% IJ SOLN
INTRAMUSCULAR | Status: DC | PRN
  Administered 2015-08-08: 02:00:00

## 2015-08-08 MED ORDER — SODIUM CHLORIDE 0.9 % IV SOLN
250.0000 mL | INTRAVENOUS | Status: DC
  Administered 2015-08-08: 250 mL via INTRAVENOUS

## 2015-08-08 MED ORDER — LIDOCAINE HCL (PF) 1 % IJ SOLN
INTRAMUSCULAR | Status: AC
  Filled 2015-08-08: qty 60

## 2015-08-08 MED ORDER — CHLORHEXIDINE GLUCONATE 4 % EX LIQD
CUTANEOUS | Status: AC
  Filled 2015-08-08: qty 15

## 2015-08-08 MED ORDER — SODIUM CHLORIDE 0.9 % IR SOLN
80.0000 mg | Status: AC
  Administered 2015-08-08: 80 mg
  Filled 2015-08-08: qty 2

## 2015-08-08 MED ORDER — SODIUM CHLORIDE 0.9 % IR SOLN
Status: AC
  Filled 2015-08-08: qty 2

## 2015-08-08 MED ORDER — DOPAMINE-DEXTROSE 3.2-5 MG/ML-% IV SOLN
INTRAVENOUS | Status: AC
Start: 2015-08-08 — End: 2015-08-08
  Administered 2015-08-08: 800 mg
  Filled 2015-08-08: qty 250

## 2015-08-08 MED ORDER — TRAMADOL HCL 50 MG PO TABS
50.0000 mg | ORAL_TABLET | Freq: Once | ORAL | Status: AC
  Administered 2015-08-08: 50 mg via ORAL
  Filled 2015-08-08: qty 1

## 2015-08-08 MED ORDER — MIDAZOLAM HCL 5 MG/5ML IJ SOLN
INTRAMUSCULAR | Status: AC
  Filled 2015-08-08: qty 5

## 2015-08-08 MED ORDER — INSULIN ASPART 100 UNIT/ML ~~LOC~~ SOLN
0.0000 [IU] | Freq: Every day | SUBCUTANEOUS | Status: DC
  Administered 2015-08-08: 4 [IU] via SUBCUTANEOUS

## 2015-08-08 MED ORDER — CETYLPYRIDINIUM CHLORIDE 0.05 % MT LIQD: 7.0000 mL | Freq: Two times a day (BID) | OROMUCOSAL | Status: DC

## 2015-08-08 MED ORDER — LIDOCAINE HCL (PF) 1 % IJ SOLN
INTRAMUSCULAR | Status: DC | PRN
  Administered 2015-08-08: 40 mL via INTRADERMAL

## 2015-08-08 MED ORDER — ONDANSETRON HCL 4 MG/2ML IJ SOLN: 4.0000 mg | Freq: Four times a day (QID) | INTRAMUSCULAR | Status: DC | PRN

## 2015-08-08 MED FILL — Naphazoline-Glycerin Ophth Soln 0.012-0.2%: OPHTHALMIC | Qty: 15 | Status: AC

## 2015-08-08 SURGICAL SUPPLY — 4 items
CATH S G BIP PACING (SET/KITS/TRAYS/PACK) ×3 IMPLANT
PACK CARDIAC CATHETERIZATION (CUSTOM PROCEDURE TRAY) ×3 IMPLANT
SHEATH PINNACLE 6F 10CM (SHEATH) ×3 IMPLANT
SLEEVE REPOSITIONING LENGTH 30 (MISCELLANEOUS) ×3 IMPLANT

## 2015-08-08 SURGICAL SUPPLY — 6 items
CABLE SURGICAL S-101-97-12 (CABLE) ×2 IMPLANT
LEAD CAPSURE NOVUS 5076-58CM (Lead) ×2 IMPLANT
PAD DEFIB LIFELINK (PAD) ×2 IMPLANT
PPMADVISA SR MRI A3SR01 (Pacemaker) ×2 IMPLANT
SHEATH CLASSIC 7F (SHEATH) ×2 IMPLANT
TRAY PACEMAKER INSERTION (PACKS) ×2 IMPLANT

## 2015-08-08 NOTE — Consult Note (Signed)
ELECTROPHYSIOLOGY CONSULT NOTE    Patient ID: Troy [name] MRN: [id number], DOB/AGE: [birth year] 82 y.o.  Admit date: 08/07/2015 Date of Consult: 08/08/2015  Primary Physician: Barbette Reichmann, MD Primary Cardiologist: Dr. Regino Schultze ([facility])  Reason for Consultation: evaluate for PPM  HPI: Troy [name] is a 82 y.o. male was admitted to [facility] 08/03/15 with c/o progressive SOB, wheezing treated for CHF and COPD exacerbation, cardiology was consulted 08/06/15 for near syncope while working with PT associated with pauses and marked bradycardia on his telemetry.  He was observed to asystolic events >10 seconds, telemetry looks as long as 20seconds and associated with LOC.  Home meds included: Toprol XL  daily [facility] meds included: cardiology consult notesToprol XL  nightly, held as of their consult   His PMHx is lnegthy and complicated, chronic Afib on Coumadin s/p multiple DCCV s/p MAZE procedure at the time of his mitral valve repair s/p DCCV 6 months following MAZE, mitral valve prolapse s/p repair in 1999, chronic respiratory failure on home oxygen, chronic systolic CHF, Parkinson's disease, HTN, HLD, OSA on CPAP, and diabetic polyneuropathy. Cardiac catheterization in 1999 at the time of his mitral valve repair showed normal coronary arteries. No ischemic evaluations since then. He has been managed in chronic Afib on metoprolol and Coumadin. Most recent echo from 08/01/2015 in the setting of increased SOB showed EF 45%, trivial AI, trival MR, mild MS. He has been seeing his PCP in mid and late March for URI/acute bronchitis that was treated with antibiotics. He continued to be SOB in late March and was advised to increase his Lasix to 40 mg daily. He presented to [facility] 3/31 with the above increased SOB. In the ED he was noted to be in acute respiratory distress with hypoxia. CXR showed stable cardiomegaly with vascular congestion with no acute findings. Troponin negative x 3, BNP 65, influenza  negative, non-acute CBC or bmet. diuresed with IV Lasix and treated with nebs by IM throughout his admission there.  On 08/06/15 he had an episode of syncope while with PT and observed pause up to 10 seconds on telemetry by notes, cardiology consulted and held his Toprol.  On 08/07/15, cardiology noted no further events, after discussing with Dr. Elberta Fortis via telephone thoughts were to have him wear an event monitor and f/u out patient.  He was seen by neurology who dis a CT given the syncopal event and CT was ordered, but the patient subsequently transferred to [facility] and f/u neuro has not been done. Noting  The patient was transferred to [facility] later 08/07/15 with HR observed into 20-30 range with recurrent pauses.  Here at [facility] he had further pauses associated with diaphoresis and nausea, dopamine was started and emergent temp pacer was placed by Dr. Swaziland early this morning/overnight.  This morning his telemetry has been afib 70's, no pacing has been noted. BP is stable, Dopamine off at 0250.   BMET/CBC are stable INR 1.45 on heparin gtt now (off warfarin) P.BNP is 58 TSH wnl, Trop <0.10 x2 Dig level <0.2 (I do not see record that he was ever on digoxin)  Past Medical History  Diagnosis Date  . Diabetes mellitus without complication (HCC)   . Hypertension   . UTI (lower urinary tract infection)   . Parkinson's disease (HCC)   . TIA (transient ischemic attack)   . Chronic atrial fibrillation (HCC)     a. on Couamdin; b. CHADS2VASc at least 7 (CHF, HTN, age x 2, DM, stroke x 2); c.  s/p MAZE 1999  . Chronic systolic CHF (congestive heart failure) (HCC)     a. echo 08/01/2015: EF of 45%, mild LVH, mitral valve with annular calcification, mitral valve partially mobile  . Stroke (HCC)   . Hypothyroidism   . Normal coronary arteries     a. by cardiac cath 1999  . Mitral valve prolapse     a. s/p mitral valve repair 1999     Surgical History:  Past Surgical History  Procedure Laterality Date  . Cardiac  surgery    . Cholecystectomy    . Cardiac catheterization N/A 08/08/2015    Procedure: Temporary Pacemaker;  Surgeon: Peter M SwazilandJordan, MD;  [facility];  Service: Cardiovascular;  Laterality: N/A;     Prescriptions prior to admission  Medication Sig Dispense Refill Last Dose  . albuterol (PROVENTIL HFA) 108 (90 Base) MCG/ACT inhaler Inhale 2 puffs into the lungs every 4 (four) hours as needed for wheezing or shortness of breath. 1 Inhaler 0 prn at prn   . carbidopa-levodopa (SINEMET IR) 25-250 MG tablet Take 1 tablet by mouth 3 (three) times daily.   08/03/2015 at 0800  . cetirizine (ZYRTEC) 10 MG tablet Take 10 mg by mouth daily.   08/03/2015 at 0800  . furosemide (LASIX) 40 MG tablet Take 40 mg by mouth daily.   08/03/2015 at 0800  . glipiZIDE (GLUCOTROL) 10 MG tablet Take 5-10 mg by mouth 2 (two) times daily. Pt takes one tablet in the morning and one-half tablet in the evening.   08/03/2015 at 0800  . insulin regular human CONCENTRATED (HUMULIN R) 500 UNIT/ML injection Inject 4-6 Units into the skin 2 (two) times daily with a meal. Pt uses four units with breakfast and six units with dinner.   08/03/2015 at 0800  . lisinopril (PRINIVIL,ZESTRIL) 40 MG tablet Take 40 mg by mouth at bedtime.    08/02/2015 at Unknown time  . metoprolol succinate (TOPROL-XL) 25 MG 24 hr tablet Take 1 tablet (25 mg total) by mouth every evening. Take with or immediately following a meal. 30 tablet 0   . Multiple Vitamins-Minerals (PRESERVISION AREDS 2) CAPS Take 1 capsule by mouth 2 (two) times daily.   08/03/2015 at 0800  . saxagliptin HCl (ONGLYZA) 2.5 MG TABS tablet Take 5 mg by mouth daily.   08/03/2015 at 0800  . Tetrahydroz-Dextran-PEG-Povid (EYE DROPS ADVANCED RELIEF) 0.05-0.1-1-1 % SOLN Place 1 drop into both eyes daily.   08/03/2015 at 0800  . warfarin (COUMADIN) 3 MG tablet Take 3 mg by mouth every evening. Pt takes on Tuesday, Thursday, and Saturday.   08/02/2015 at Unknown time  . warfarin  (COUMADIN) 6 MG tablet Take 6 mg by mouth every evening. Pt takes on Monday, Wednesday, Friday, and Sunday.   08/02/2015 at Unknown time    Inpatient Medications:  . antiseptic oral rinse  7 mL Mouth Rinse BID  . budesonide (PULMICORT) nebulizer solution  0.25 mg Nebulization BID  . carbidopa-levodopa  1 tablet Oral TID  . furosemide  20 mg Intravenous Q12H  . insulin aspart  0-15 Units Subcutaneous TID WC  . insulin aspart  0-5 Units Subcutaneous QHS  . insulin glargine  15 Units Subcutaneous Daily  . ipratropium-albuterol  3 mL Nebulization BID  . lisinopril  20 mg Oral QHS  . loratadine  10 mg Oral Daily  . multivitamin  1 tablet Oral BID  . Naphazoline-Glycerin  1 drop Both Eyes Daily  . polyethylene glycol  17 g  Oral Daily  . predniSONE  40 mg Oral Q breakfast  . senna-docusate  1 tablet Oral BID    Allergies: No Known Allergies  Social History   Social History  . Marital Status: Married    Spouse Name: N/A  . Number of Children: N/A  . Years of Education: N/A   Occupational History  . Not on file.   Social History Main Topics  . Smoking status: Former Smoker -- 4.00 packs/day for 20 years    Types: Cigarettes  . Smokeless tobacco: Never Used  . Alcohol Use: No  . Drug Use: No  . Sexual Activity: Not on file   Other Topics Concern  . Not on file   Social History Narrative     Family History  Problem Relation Age of Onset  . Hypertension Mother   . Stroke Mother   . CAD Father   . Lymphoma Sister   . Lung disease Brother      Review of Systems: All other systems reviewed and are otherwise negative except as noted above.  Physical Exam: Filed Vitals:   08/08/15 0500 08/08/15 0600 08/08/15 0700 08/08/15 0735  BP: 156/98 151/90 151/97   Pulse: 82 78 64   Temp:      TempSrc:      Resp: Height:      Weight:      SpO2: 92% 97% 97% 94%    GEN- The patient is in NAD, alert and oriented x 3 today.   HEENT: normocephalic, atraumatic;  sclera clear, conjunctiva pink; hearing intact; oropharynx clear; neck supple, no JVP Lymph- no cervical lymphadenopathy Lungs- Clear to ausculation bilaterally, normal work of breathing.  No wheezes, rales, rhonchi Heart- Irregular rate and rhythm, no murmurs, rubs or gallops GI- soft, non-tender, non-distended, bowel sounds present Extremities- no clubbing, cyanosis, or edema MS- no significant deformity or atrophy Skin- warm and dry, no rash or lesion Psych- euthymic mood, full affect Neuro- no gross deficits observed  Labs:   Lab Results  Component Value Date   WBC 9.4 08/08/2015   HGB 13.2 08/08/2015   HCT 42.3 08/08/2015   MCV 90.8 08/08/2015   PLT 192 08/08/2015    Recent Labs Lab 08/08/15 0244  NA 138  K 4.8  CL 94*  CO2 33*  BUN 28*  CREATININE 1.25*  CALCIUM 9.2  PROT 7.2  BILITOT 1.1  ALKPHOS 78  ALT 18  AST 33  GLUCOSE 301*      Radiology/Studies:  Ct Head Wo Contrast 08/07/2015  CLINICAL DATA:  Unresponsive. EXAM: CT HEAD WITHOUT CONTRAST TECHNIQUE: Contiguous axial images were obtained from the base of the skull through the vertex without intravenous contrast. COMPARISON:  03/14/2014, 02/25/2015 FINDINGS: Moderate to advanced atrophy stable. Negative for hydrocephalus. Ventricular enlargement consistent with atrophy Chronic microvascular ischemic changes in the white matter. Negative for acute infarct. Negative for intracranial hemorrhage. No intracranial mass lesion. Soft tissue mass in the anterior ethmoid sinus on both sides of the nasal septum. The mass measures 20 x 26 mm and has been present on prior studies with slow growth. The mass appears to penetrate through the nasal septum. This is most consistent with malignancy. Biopsy recommended. IMPRESSION: Atrophy and chronic microvascular ischemia. 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 Biopsy recommended Electronically Signed   By: Marlan Palau M.D.   On: 08/07/2015 12:44   Dg Chest Port 1 View 08/03/2015  CLINICAL DATA:  Cold x 2 weeks, shortness of breath today EXAM: PORTABLE CHEST 1 VIEW COMPARISON:  07/11/2015 FINDINGS: Severe cardiac enlargement stable. Moderate vascular congestion stable. No infiltrate or effusion. IMPRESSION: Stable cardiomegaly and vascular congestion with no acute findings Electronically Signed   By: Esperanza Heir M.D.   On: 08/03/2015 15:05    EKG: Afib RBBB TELEMETRY: AFib, pauses as long as 20 seconds, rates currently 70's Most Recent Cardiac Studies: Echo 08/01/2015: ECHOCARDIOGRAPHIC DESCRIPTIONS  AORTIC ROOT Size:Normal Dissection:INDETERM FOR DISSECTION  AORTIC VALVE Leaflets:Tricuspid Morphology:Normal Mobility:Fully mobile  LEFT VENTRICLE Size:Normal Anterior:Normal Contraction:REGIONALLY IMPAIRED Lateral:Normal Closest EF:45% (Estimated) Septal:Normal LV Masses:No Masses Apical:Normal ZOX:WRUE LVH CONCENTRIC Inferior:Normal Posterior:Normal  Cardiac cath 1999: DIAGNOSTIC SUMMARY Coronary Artery Disease Left Ventriculogram Left Main: normal Ejection Fraction: 53% LAD system: normal Mitral regurgitation: Moderate LCX system: normal Mitral valve prolapse: Definite RCA system: insignificant Right Heart Catheterization No significant CAD indicated Pulmonary Hypertension: Mild        Assessment and Plan:   1. PAFib, marked pauses, asystolic events, AFib ith RVR rates noted though appears at/around the time of his temp wire placement.      08/06/15 Toprol stopped, last dose 08/06/15 AM     Temp pacing wire placed overnight       Records state observed HR as high as 140's, likely tachy-brady      2. CT brain findings noted     Will ask ENT to see regarding his CT findings  3. Permanent Afib     CHA2DS2Vasc is at least 7, on warfarin out patient     Heparin gtt currently  4. Chronic CHF     P.BNP is wnl     Exam is compensated  5. Hx of MVrepair (annulaplasty) and MAZE  procedure    Signed, Francis Dowse, PA-C 08/08/2015 8:30 AM   I have seen and examined this patient with Francis Dowse.  Agree with above, note added to reflect my findings.  On exam, Regular rhythm, no murmurs, lungs clear.  Has atrial fibrillation and has had tachy-brady syndrome with significant pauses up to 15 seconds.  Off metoprolol now but not requiring pacing with temporary wire.  That being said, will likely need beta blockers for rate control.  Will plan for pacemaker placement today.  Discussed risks and benefits.  Risks include but are not limited to bleeding, tamponade, pneumothorax and infection.  The patient understands the risks and has agreed to the procedure.    Will M. Camnitz MD 08/08/2015 3:38 PM

## 2015-08-08 NOTE — Interval H&P Note (Signed)
History and Physical Interval Note:  08/08/2015 1:39 AM  Troy [name]  has presented today for surgery, with the diagnosis of temp pacer  The various methods of treatment have been discussed with the patient and family. After consideration of risks, benefits and other options for treatment, the patient has consented to  Procedure(s): Temporary Pacemaker (N/A) as a surgical intervention .  The patient's history has been reviewed, patient examined, no change in status, stable for surgery.  I have reviewed the patient's chart and labs.  Questions were answered to the patient's satisfaction.     Theron Aristaeter [facility] County Memorial HospitalJordanMD,FACC 08/08/2015 1:39 AM

## 2015-08-08 NOTE — Progress Notes (Signed)
Patient back from cath lab. Cards fellow notified and verbal orders to d/c Dopamine now and restart Heparin at 0315 was given.

## 2015-08-08 NOTE — Progress Notes (Signed)
ANTICOAGULATION CONSULT NOTE - Follow-up Consult  Pharmacy Consult for heparin Indication: atrial fibrillation  No Known Allergies  Patient Measurements: Height: 5\' 9"  (175.3 cm) Weight: 234 lb 2.1 oz (106.2 kg) IBW/kg (Calculated) : 70.7 Heparin Dosing Weight: 93 kg  Vital Signs: Temp: 98.2 F (36.8 C) (04/05 1745) Temp Source: Oral (04/05 1745) BP: 146/82 mmHg (04/05 1900) Pulse Rate: 82 (04/05 1900)  Labs:  Recent Labs  08/06/15 0516 08/06/15 1016 08/07/15 0338 08/08/15 0244 08/08/15 1107  HGB  --   --  13.0 13.2  --   HCT  --   --  38.4* 42.3  --   PLT  --   --  163 192  --   APTT  --   --   --  31  --   LABPROT  --  21.9* 18.7* 17.8*  --   INR  --  1.92 1.56 1.45  --   HEPARINUNFRC  --   --  <0.10* <0.10* 0.14*  CREATININE 1.05  --   --  1.25*  --     Estimated Creatinine Clearance: 47.2 mL/min (by C-G formula based on Cr of 1.25).   Assessment: 82 [facility] [place]. On Coumadin PTA for Afib. INR down to 1.45.   Had pacemaker placed today by EP. Unable to get a hold of EP MD or PA so discussed with Cards PA about whether or not we need to restart heparin or coumadin tonight. Will plan to hold off on restarting either tonight and follow up tomorrow.  Goal of Therapy:  Heparin level 0.3-0.7 units/ml Monitor platelets by anticoagulation protocol: Yes   Plan:  Currently off heparin and coumadin Monitor CBC, s/s of bleed  F/U with EP recs tomorrow about potentially restarting coumadin

## 2015-08-08 NOTE — Progress Notes (Signed)
ANTICOAGULATION CONSULT NOTE - Follow-up Consult  Pharmacy Consult for heparin Indication: atrial fibrillation  No Known Allergies  Patient Measurements: Height: 5\' 9"  (175.3 cm) Weight: 234 lb 2.1 oz (106.2 kg) IBW/kg (Calculated) : 70.7 Heparin Dosing Weight: 93 kg  Vital Signs: Temp: 98 F (36.7 C) (04/05 0800) Temp Source: Oral (04/05 0800) BP: 158/95 mmHg (04/05 0800) Pulse Rate: 90 (04/05 0800)  Labs:  Recent Labs  08/06/15 0516 08/06/15 1016 08/07/15 0338 08/08/15 0244 08/08/15 1107  HGB  --   --  13.0 13.2  --   HCT  --   --  38.4* 42.3  --   PLT  --   --  163 192  --   APTT  --   --   --  31  --   LABPROT  --  21.9* 18.7* 17.8*  --   INR  --  1.92 1.56 1.45  --   HEPARINUNFRC  --   --  <0.10* <0.10* 0.14*  CREATININE 1.05  --   --  1.25*  --     Estimated Creatinine Clearance: 47.2 mL/min (by C-G formula based on Cr of 1.25).   Assessment: 82 [facility] [place] [facility]. On Coumadin PTA for Afib. INR down to 1.45.   Pt continues on heparin bridge. Heparin level still low this morning after rate changes. CBC within normal limits. No bleeding issues noted.   Goal of Therapy:  Heparin level 0.3-0.7 units/ml Monitor platelets by anticoagulation protocol: Yes   Plan:  Increase heparin gtt to1400 units/hr Recheck HL in 8 hours   Sheppard CoilFrank Wilson PharmD., BCPS Clinical Pharmacist Pager [phone] 08/08/2015 12:40 PM

## 2015-08-08 NOTE — Progress Notes (Signed)
Pt had 2 more episodes of cardiac pauses. On the second episode, patient became diaphoretic and nauseated. Symptoms subsided. Cards fellow notified and orders to start Dopamine at 2 mls were given. On second episode, cards fellow called to bedside and assessed patient. Plan to do a temporary pacemaker tonight. Family/wife called and updated. Nurse at bedside along with cards fellow.

## 2015-08-08 NOTE — H&P (View-Only) (Signed)
Contacted by nurses this evening that [name] had numerous episodes of bradycardia, pauses up to 8 or 9 seconds. On review of telemetry, I printed out many of the events showing frequent episodes of profound bradycardia with heart rates of 30, frequent episodes of pauses up to 8 seconds. He was symptomatic per the family at the bedside, with near syncope. He has been in bed the entire time. While I was in the room he did have bradycardia down to 30 bpm, reported feeling like he was going to "go out". Reported having profound lightheadedness  Currently without IV access as he pulled his access out earlier secondary to agitation Long discussion with caretaker and wife at the bedside recommended that we will likely need transfer to [place] for pacemaker. Family is in agreement and willing to proceed.  Recommend nurses try to obtain IV access, place pacer pads, atropine to the bedside. Could potentially start dopamine overnight once he reaches . If unstable or if he continues to pull out his IV access, he may require urgent pacemaker.  I returned the 30 day monitor back to the office. Previously this is provided to the patient to be worn at the time of discharge.  We [facility], transfer orders have been discussed and placed, accepting team aware of the situation.   Total encounter time more than 35 minutes  Greater than 50% was spent in counseling and coordination of care with the patient

## 2015-08-08 NOTE — Progress Notes (Signed)
ANTICOAGULATION CONSULT NOTE - Follow-up Consult  Pharmacy Consult for heparin Indication: atrial fibrillation  No Known Allergies  Patient Measurements: Height: 5\' 9"  (175.3 cm) Weight: 234 lb 2.1 oz (106.2 kg) IBW/kg (Calculated) : 70.7 Heparin Dosing Weight: 93 kg  Vital Signs: Temp: 97.8 F (36.6 C) (04/05 0400) Temp Source: Oral (04/05 0400) BP: 156/98 mmHg (04/05 0500) Pulse Rate: 82 (04/05 0500)  Labs:  Recent Labs  08/06/15 0516 08/06/15 1016 08/07/15 0338 08/08/15 0244  HGB  --   --  13.0 13.2  HCT  --   --  38.4* 42.3  PLT  --   --  163 192  APTT  --   --   --  31  LABPROT  --  21.9* 18.7* 17.8*  INR  --  1.92 1.56 1.45  HEPARINUNFRC  --   --  <0.10* <0.10*  CREATININE 1.05  --   --  1.25*    Estimated Creatinine Clearance: 47.2 mL/min (by C-G formula based on Cr of 1.25).   Assessment: 82 yo [facility] [place]. [facility]. On Coumadin PTA for Afib. INR down to 1.45. Pt on heparin bridge. Heparin level remains undetectable on 1200 units/hr - however heparin off for temporary pacemaker from 0130-0330. CBC stable.   Goal of Therapy:  Heparin level 0.3-0.7 units/ml Monitor platelets by anticoagulation protocol: Yes   Plan:  Continue heparin gtt at 1200 units/hr F/u heparin level 8 hours from restart  Christoper Fabianaron Braxtyn Dorff, PharmD, BCPS Clinical pharmacist, pager [phone] 08/08/2015 5:59 AM

## 2015-08-08 NOTE — Progress Notes (Signed)
Pt noted to have a 5 second cardiac pause. Pt asymptomatic. Will continue to monitor patient and cardiac monitor.

## 2015-08-09 ENCOUNTER — Inpatient Hospital Stay (HOSPITAL_COMMUNITY): Payer: Medicare Other

## 2015-08-09 ENCOUNTER — Encounter (HOSPITAL_COMMUNITY): Payer: Self-pay | Admitting: Cardiology

## 2015-08-09 LAB — CBC
HEMATOCRIT: 39.9 % (ref 39.0–52.0)
HEMOGLOBIN: 13.3 g/dL (ref 13.0–17.0)
MCH: 29.5 pg (ref 26.0–34.0)
MCHC: 33.3 g/dL (ref 30.0–36.0)
MCV: 88.5 fL (ref 78.0–100.0)
Platelets: 243 10*3/uL (ref 150–400)
RBC: 4.51 MIL/uL (ref 4.22–5.81)
RDW: 14.6 % (ref 11.5–15.5)
WBC: 10.9 10*3/uL — AB (ref 4.0–10.5)

## 2015-08-09 LAB — GLUCOSE, CAPILLARY: Glucose-Capillary: 187 mg/dL — ABNORMAL HIGH (ref 65–99)

## 2015-08-09 MED ORDER — PREDNISONE 10 MG PO TABS: ORAL_TABLET | ORAL | Status: DC

## 2015-08-09 MED FILL — Naphazoline-Glycerin Ophth Soln 0.03-0.5%: OPHTHALMIC | Qty: 15 | Status: AC

## 2015-08-09 NOTE — Consult Note (Signed)
Reason for Consult: Nasal mass  HPI:  Troy [name] is an 82 y.o. male who was admitted for treatment of syncope and profound badycardia. He was treated with pacemaker placement. On his head CT scan, he was noted to have a 2.6 cm soft tissue mass within the anterior ethmoid air cells. The mass is bilateral, and has eroded through the nasal septum. ENT is therefore for further evaluation and treatment. The patient is unaware of his nasal mass. He denies any significant nasal obstruction. He also denies any epistaxis or recent sinusitis. He has no history of sinonasal surgery.  Past Medical History  Diagnosis Date  . Diabetes mellitus without complication (HCC)   . Hypertension   . UTI (lower urinary tract infection)   . Parkinson's disease (HCC)   . TIA (transient ischemic attack)   . Chronic atrial fibrillation (HCC)     a. on Couamdin; b. CHADS2VASc at least 7 (CHF, HTN, age x 2, DM, stroke x 2); c. s/p MAZE 1999  . Chronic systolic CHF (congestive heart failure) (HCC)     a. echo 08/01/2015: EF of 45%, mild LVH, mitral valve with annular calcification, mitral valve partially mobile  . Stroke (HCC)   . Hypothyroidism   . Normal coronary arteries     a. by cardiac cath 1999  . Mitral valve prolapse     a. s/p mitral valve repair 1999    Past Surgical History  Procedure Laterality Date  . Cardiac surgery    . Cholecystectomy    . Cardiac catheterization N/A 08/08/2015    Procedure: Temporary Pacemaker;  Surgeon: Peter M Swaziland, MD;  Location: [facility] INVASIVE CV LAB;  Service: Cardiovascular;  Laterality: N/A;  . Ep implantable device N/A 08/08/2015    Procedure: Pacemaker Implant;  Surgeon: Will Jorja Loa, MD;  Location: [facility] INVASIVE CV LAB;  Service: Cardiovascular;  Laterality: N/A;    Family History  Problem Relation Age of Onset  . Hypertension Mother   . Stroke Mother   . CAD Father   . Lymphoma Sister   . Lung disease Brother     Social History:  reports that he has quit  smoking. His smoking use included Cigarettes. He has a 80 pack-year smoking history. He has never used smokeless tobacco. He reports that he does not drink alcohol or use illicit drugs.  Allergies: No Known Allergies  Prior to Admission medications   Medication Sig Start Date End Date Taking? Authorizing Provider  albuterol (PROVENTIL HFA) 108 (90 Base) MCG/ACT inhaler Inhale 2 puffs into the lungs every 4 (four) hours as needed for wheezing or shortness of breath. 07/11/15   Sharman Cheek, MD  carbidopa-levodopa (SINEMET IR) 25-250 MG tablet Take 1 tablet by mouth 3 (three) times daily.    Historical Provider, MD  cetirizine (ZYRTEC) 10 MG tablet Take 10 mg by mouth daily.    Historical Provider, MD  furosemide (LASIX) 40 MG tablet Take 40 mg by mouth daily.    Historical Provider, MD  glipiZIDE (GLUCOTROL) 10 MG tablet Take 5-10 mg by mouth 2 (two) times daily. Pt takes one tablet in the morning and one-half tablet in the evening.    Historical Provider, MD  insulin regular human CONCENTRATED (HUMULIN R) 500 UNIT/ML injection Inject 4-6 Units into the skin 2 (two) times daily with a meal. Pt uses four units with breakfast and six units with dinner.    Historical Provider, MD  lisinopril (PRINIVIL,ZESTRIL) 40 MG tablet Take 40 mg by mouth  at bedtime.     Historical Provider, MD  metoprolol succinate (TOPROL-XL) 25 MG 24 hr tablet Take 1 tablet (25 mg total) by mouth every evening. Take with or immediately following a meal. 08/06/15   Delfino Lovett, MD  Multiple Vitamins-Minerals (PRESERVISION AREDS 2) CAPS Take 1 capsule by mouth 2 (two) times daily.    Historical Provider, MD  predniSONE (DELTASONE) 10 MG tablet Take 4 tablets daily for 3 days then 2 tablets daily for 3 days then 1 tablet daily for 3 days then stop 08/09/15   Marily Lente, NP  saxagliptin HCl (ONGLYZA) 2.5 MG TABS tablet Take 5 mg by mouth daily.    Historical Provider, MD  Tetrahydroz-Dextran-PEG-Povid (EYE DROPS ADVANCED RELIEF)  0.05-0.1-1-1 % SOLN Place 1 drop into both eyes daily.    Historical Provider, MD  warfarin (COUMADIN) 3 MG tablet Take 3 mg by mouth every evening. Pt takes on Tuesday, Thursday, and Saturday.    Historical Provider, MD  warfarin (COUMADIN) 6 MG tablet Take 6 mg by mouth every evening. Pt takes on Monday, Wednesday, Friday, and Sunday.    Historical Provider, MD    Dg Chest 2 View  08/09/2015  CLINICAL DATA:  Chest and arm soreness following pacemaker placement. EXAM: CHEST  2 VIEW COMPARISON:  08/03/2015 and previous FINDINGS: Previous median sternotomy and mitral valve replacement. Newly placed single lead pacemaker from a left subclavian approach. Lead is in the region of the right ventricle, though on the lateral view the lead is more posteriorly positioned than often seen for right ventricular location. In cases of cardiomegaly, this could still be appropriately positioned within the right ventricular wall. No complication seen relating to the pacemaker itself. No pneumothorax. Cardiomegaly persists. The aorta is unfolded. There may be venous hypertension but there is no frank edema. No infiltrate, collapse or effusion. Ordinary degenerative change affects the spine. IMPRESSION: No complications seen following single lead pacemaker placement. See above description of pacemaker lead position on the lateral film. Cardiomegaly and pulmonary venous hypertension. Electronically Signed   By: Paulina Fusi M.D.   On: 08/09/2015 07:56   Ct Head Wo Contrast  08/07/2015  CLINICAL DATA:  Unresponsive. EXAM: CT HEAD WITHOUT CONTRAST TECHNIQUE: Contiguous axial images were obtained from the base of the skull through the vertex without intravenous contrast. COMPARISON:  03/14/2014, 02/25/2015 FINDINGS: Moderate to advanced atrophy stable. Negative for hydrocephalus. Ventricular enlargement consistent with atrophy Chronic microvascular ischemic changes in the white matter. Negative for acute infarct. Negative for  intracranial hemorrhage. No intracranial mass lesion. Soft tissue mass in the anterior ethmoid sinus on both sides of the nasal septum. The mass measures 20 x 26 mm and has been present on prior studies with slow growth. The mass appears to penetrate through the nasal septum. This is most consistent with malignancy. Biopsy recommended. IMPRESSION: Atrophy and chronic microvascular ischemia. 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 Biopsy recommended Electronically Signed   By: Marlan Palau M.D.   On: 08/07/2015 12:44   Review of Systems: Review of Systems  Constitutional: Positive for weight loss and malaise/fatigue. Negative for fever, chills and diaphoresis.  HENT: Negative for congestion.  Eyes: Negative for discharge and redness.  Respiratory: Positive for shortness of breath. Negative for cough, hemoptysis, sputum production and wheezing.  Cardiovascular: Negative for chest pain, palpitations, orthopnea, claudication, leg swelling and PND.  Gastrointestinal: Positive for nausea. Negative for vomiting and abdominal pain.  Musculoskeletal: Negative for  myalgias and falls.  Skin: Negative for rash.  Neurological: Positive for dizziness and weakness. Negative for tingling, tremors, sensory change, speech change, focal weakness, seizures and loss of consciousness.  Endo/Heme/Allergies: Does not bruise/bleed easily.  Psychiatric/Behavioral: Negative for substance abuse. The patient is not nervous/anxious.  All other systems reviewed and are negative.  Blood pressure 163/94, pulse 88, temperature 97.4 F (36.3 C), temperature source Oral, resp. rate 18, height 5\' 9"  (1.753 m), weight 106.2 kg (234 lb 2.1 oz), SpO2 100 %.   Physical Exam: GENERAL: 82 y.o.-year-old patient. Comfortably in bed. NAD. EYES: Pupils equal, round, reactive to light and accommodation. No scleral icterus. Extraocular muscles intact.  HEENT: Head  atraumatic, normocephalic. Oropharynx and nasopharynx clear. No oropharyngeal erythema, moist oral mucosa  EARS: Normal auricles and EACs. NOSE: Anterior rhinoscopy shows normal mucosa. NECK: Supple, no jugular venous distention. No thyroid enlargement, no tenderness.  NEUROLOGIC: Cranial nerves II through XII are intact. No focal Motor or sensory deficits appreciated b/l.  PSYCHIATRIC: The patient is alert and oriented x 3. Good affect.  SKIN: No obvious rash, lesion, or ulcer.   Procedure:  Flexible Fiberoptic Nasal Endoscopy Anesthesia: Topical oxymetazoline and lidocaine Indication: Nasal mass Description: Risks, benefits, and alternatives of flexible endoscopy were explained to the patient.  Specific mention was made of the risk of throat numbness with difficulty swallowing, possible bleeding from the nose and mouth, and pain from the procedure.  The patient gave oral consent to proceed.  The nasal cavities were decongested and anesthetised with a combination of oxymetazoline and 4% lidocaine solution.  The flexible scope was inserted into the right nasal cavity.  A soft tissue mass is noted at the anterior superior nasal cavity.  Nasopharynx was clear.  Turbinates were without mass.  The procedure was repeated on the contralateral side with similar findings.  The patient tolerated the procedure well.  Instructions were given to avoid eating or drinking for 2 hours.    Assessment/Plan: A 2.6 cm soft tissue mass at the anterior superior nasal cavity bilaterally. The mass has eroded through the nasal septum. Plan to biopsy the nasal mass in my office under local anesthesia. Please have the patient call my office at [phone] to schedule an appointment. The patient is otherwise asymptomatic. No other acute intervention is needed at this time.  Marilin Kofman,SUI W 08/09/2015, 8:53 AM

## 2015-08-09 NOTE — Progress Notes (Signed)
Inpatient Diabetes Program Recommendations  AACE/ADA: New Consensus Statement on Inpatient Glycemic Control (2015)  Target Ranges:  Prepandial:   less than 140 mg/dL      Peak postprandial:   less than 180 mg/dL (1-2 hours)      Critically ill patients:  140 - 180 mg/dL  Results for Troy [name], [name] (MRN [id number]) as of 08/09/2015 08:34  Ref. Range 08/08/2015 08:15 08/08/2015 12:14 08/08/2015 17:12 08/08/2015 21:31 08/09/2015 07:52  Glucose-Capillary Latest Ref Range: 65-99 mg/dL 629249 (H) 528227 (H) 413250 (H) 337 (H) 187 (H)   Review of Glycemic Control  Diabetes history: DM2 Outpatient Diabetes medications: U500 4 units with breakfast, U500 6 units with supper, Glipizide 10 mg QAM, Glipizide 15 mg QPM, Onglyza 5 mg daily Current orders for Inpatient glycemic control: Lantus 15 units daily, Novolog 0-15 units TID with meals, Novolog 0-5 units QHS  Inpatient Diabetes Program Recommendations: Insulin - Meal Coverage: If steroids will be continued as ordered, please consider ordering Novolog 5 units TID with meals for meal coverage if patient eats at least 50% of meal.  Thanks, Orlando PennerMarie Alvester Eads, RN, MSN, CDE Diabetes Coordinator Inpatient Diabetes Program [phone] (Team Pager from 8am to 5pm) [phone] ([facility] office) [phone] [facility] office) [phone] [facility] office)

## 2015-08-09 NOTE — Discharge Instructions (Signed)
Supplemental Discharge Instructions for  Pacemaker/Defibrillator Patients  Activity No heavy lifting or vigorous activity with your left/right arm for 6 to 8 weeks.  Do not raise your left/right arm above your head for one week.  Gradually raise your affected arm as drawn below.           __      08/13/15                      08/14/15                08/15/15                   08/16/15   WOUND CARE - Keep the wound area clean and dry.  Do not get this area wet for one week. No showers for one week; you may shower on  08/16/15   . - The tape/steri-strips on your wound will fall off; do not pull them off.  No bandage is needed on the site.  DO  NOT apply any creams, oils, or ointments to the wound area. - If you notice any drainage or discharge from the wound, any swelling or bruising at the site, or you develop a fever > 101? F after you are discharged home, call the office at once.  Special Instructions - You are still able to use cellular telephones; use the ear opposite the side where you have your pacemaker/defibrillator.  Avoid carrying your cellular phone near your device. - When traveling through airports, show security personnel your identification card to avoid being screened in the metal detectors.  Ask the security personnel to use the hand wand. - Avoid arc welding equipment, MRI testing (magnetic resonance imaging), TENS units (transcutaneous nerve stimulators).  Call the office for questions about other devices. - Avoid electrical appliances that are in poor condition or are not properly grounded. - Microwave ovens are safe to be near or to operate.   Information on my medicine - Coumadin   (Warfarin)  This medication education was reviewed with me or my healthcare representative as part of my discharge preparation.  The pharmacist that spoke with me during my hospital stay was:  Severiano Gilbert, [facility]  Why was Coumadin prescribed for you? Coumadin was prescribed for you  because you have a blood clot or a medical condition that can cause an increased risk of forming blood clots. Blood clots can cause serious health problems by blocking the flow of blood to the heart, lung, or brain. Coumadin can prevent harmful blood clots from forming. As a reminder your indication for Coumadin is:   Stroke Prevention Because Of Atrial Fibrillation  What test will check on my response to Coumadin? While on Coumadin (warfarin) you will need to have an INR test regularly to ensure that your dose is keeping you in the desired range. The INR (international normalized ratio) number is calculated from the result of the laboratory test called prothrombin time (PT).  If an INR APPOINTMENT HAS NOT ALREADY BEEN MADE FOR YOU please schedule an appointment to have this lab work done by your health care provider within 7 days. Your INR goal is usually a number between:  2 to 3 or your provider may give you a more narrow range like 2-2.5.  Ask your health care provider during an office visit what your goal INR is.  What  do you need to  know  About  COUMADIN? Take Coumadin (  warfarin) exactly as prescribed by your healthcare provider about the same time each day.  DO NOT stop taking without talking to the doctor who prescribed the medication.  Stopping without other blood clot prevention medication to take the place of Coumadin may increase your risk of developing a new clot or stroke.  Get refills before you run out.  What do you do if you miss a dose? If you miss a dose, take it as soon as you remember on the same day then continue your regularly scheduled regimen the next day.  Do not take two doses of Coumadin at the same time.  Important Safety Information A possible side effect of Coumadin (Warfarin) is an increased risk of bleeding. You should call your healthcare provider right away if you experience any of the following: ? Bleeding from an injury or your nose that does not  stop. ? Unusual colored urine (red or dark brown) or unusual colored stools (red or black). ? Unusual bruising for unknown reasons. ? A serious fall or if you hit your head (even if there is no bleeding).  Some foods or medicines interact with Coumadin (warfarin) and might alter your response to warfarin. To help avoid this: ? Eat a balanced diet, maintaining a consistent amount of Vitamin K. ? Notify your provider about major diet changes you plan to make. ? Avoid alcohol or limit your intake to 1 drink for women and 2 drinks for men per day. (1 drink is 5 oz. wine, 12 oz. beer, or 1.5 oz. liquor.)  Make sure that ANY health care provider who prescribes medication for you knows that you are taking Coumadin (warfarin).  Also make sure the healthcare provider who is monitoring your Coumadin knows when you have started a new medication including herbals and non-prescription products.  Coumadin (Warfarin)  Major Drug Interactions  Increased Warfarin Effect Decreased Warfarin Effect  Alcohol (large quantities) Antibiotics (esp. Septra/Bactrim, Flagyl, Cipro) Amiodarone (Cordarone) Aspirin (ASA) Cimetidine (Tagamet) Megestrol (Megace) NSAIDs (ibuprofen, naproxen, etc.) Piroxicam (Feldene) Propafenone (Rythmol SR) Propranolol (Inderal) Isoniazid (INH) Posaconazole (Noxafil) Barbiturates (Phenobarbital) Carbamazepine (Tegretol) Chlordiazepoxide (Librium) Cholestyramine (Questran) Griseofulvin Oral Contraceptives Rifampin Sucralfate (Carafate) Vitamin K   Coumadin (Warfarin) Major Herbal Interactions  Increased Warfarin Effect Decreased Warfarin Effect  Garlic Ginseng Ginkgo biloba Coenzyme Q10 Green tea St. Johns wort    Coumadin (Warfarin) FOOD Interactions  Eat a consistent number of servings per week of foods HIGH in Vitamin K (1 serving =  cup)  Collards (cooked, or boiled & drained) Kale (cooked, or boiled & drained) Mustard greens (cooked, or boiled &  drained) Parsley *serving size only =  cup Spinach (cooked, or boiled & drained) Swiss chard (cooked, or boiled & drained) Turnip greens (cooked, or boiled & drained)  Eat a consistent number of servings per week of foods MEDIUM-HIGH in Vitamin K (1 serving = 1 cup)  Asparagus (cooked, or boiled & drained) Broccoli (cooked, boiled & drained, or raw & chopped) Brussel sprouts (cooked, or boiled & drained) *serving size only =  cup Lettuce, raw (green leaf, endive, romaine) Spinach, raw Turnip greens, raw & chopped   These websites have more information on Coumadin (warfarin):  http://www.king-russell.com/www.coumadin.com; https://www.hines.net/www.ahrq.gov/consumer/coumadin.htm;

## 2015-08-09 NOTE — Discharge Summary (Signed)
ELECTROPHYSIOLOGY PROCEDURE DISCHARGE SUMMARY    Patient ID: Troy [name],  MRN: [id number], DOB/AGE: Jul 31, 1923 82 y.o.  Admit date: 08/07/2015 Discharge date: 08/09/2015  Primary Care Physician: Barbette Reichmann, MD Primary Cardiologist: Regino Schultze Electrophysiologist: Elberta Fortis ->Kwadwo Taras follow with [facility] in [facility]   Primary Discharge Diagnosis:  Symptomatic asystole status post pacemaker implantation this admission  Secondary Discharge Diagnosis:  1.  Permanent atrial fibrillation status post MAZE 2.  Mitral valve disease s/p MVR 3.  OSA on CPAP 4.  Hypertension 5.  Hyperlipidemia 6.  Diabetes 7.  Prior CVA 8.  Hypothyroidism  No Known Allergies   Procedures This Admission:  1.  Insertion of temporary pacing wire on 08/08/15 by Dr Swaziland 2.  Implantation of a MDT single chamber PPM on 08/08/15 by Dr Elberta Fortis.  The patient received a MDT model number Advisa MRI PPM with model number 5076 right ventricular lead. There were no immediate post procedure complications. 2.  CXR on 08/09/15 demonstrated no pneumothorax status post device implantation.   Brief HPI/Hospital Course:  Troy [name] is a 82 y.o. male with a past medical history as outlined above. He presented to [facility] following an episode of syncope while working with PT.  He was monitored on telemetry with profound bradycardia and asystole observed. He was transferred to [facility] for urgent temp wire placement and was seen by Dr Elberta Fortis. Recommendation was for pacemaker for tachy/brady syndrome. Risks, benefits were reviewed with the patient and their family who wished to proceed. The patient underwent implantation of a MDT single chamber pacemaker with details as outlined above.  He  was monitored on telemetry overnight which demonstrated atrial fibrillation with intermittent ventricular pacing.  Left chest was without hematoma or ecchymosis.  The device was interrogated and found to be functioning normally.  CXR was obtained and  demonstrated no pneumothorax status post device implantation.  Wound care, arm mobility, and restrictions were reviewed with the patient.  The patient was examined and considered stable for discharge to home.   Prednisone had been started at [facility] continue with taper and early follow up with PCP. Criston Chancellor resume home albuterol and home O2 at discharge.    Physical Exam: Filed Vitals:   08/09/15 0400 08/09/15 0500 08/09/15 0600 08/09/15 0702  BP: 162/82 156/92 140/84 167/106  Pulse: 69 72 83 81  Temp: 97.6 F (36.4 C)     TempSrc: Oral     Resp: Height:      Weight:      SpO2: 100% 100% 100% 100%    GEN- The patient is elderly appearing, alert today.   HEENT: normocephalic, atraumatic; sclera clear, conjunctiva pink; hearing intact; oropharynx clear; neck supple  Lungs- Clear to ausculation bilaterally, normal work of breathing.  No wheezes, rales, rhonchi Heart- Irregular rate and rhythm  GI- soft, non-tender, non-distended, bowel sounds present  Extremities- no clubbing, cyanosis, or edema  MS- no significant deformity or atrophy Skin- warm and dry, no rash or lesion, left chest without hematoma/ecchymosis Psych- euthymic mood, full affect Neuro- strength and sensation are intact   Labs:   Lab Results  Component Value Date   WBC 10.9* 08/09/2015   HGB 13.3 08/09/2015   HCT 39.9 08/09/2015   MCV 88.5 08/09/2015   PLT 243 08/09/2015     Recent Labs Lab 08/08/15 0244  NA 138  K 4.8  CL 94*  CO2 33*  BUN 28*  CREATININE 1.25*  CALCIUM 9.2  PROT  7.2  BILITOT 1.1  ALKPHOS 78  ALT 18  AST 33  GLUCOSE 301*    Discharge Medications:    Medication List    TAKE these medications        albuterol 108 (90 Base) MCG/ACT inhaler  Commonly known as:  PROVENTIL HFA  Inhale 2 puffs into the lungs every 4 (four) hours as needed for wheezing or shortness of breath.     carbidopa-levodopa 25-250 MG tablet  Commonly known as:  SINEMET IR  Take 1  tablet by mouth 3 (three) times daily.     cetirizine 10 MG tablet  Commonly known as:  ZYRTEC  Take 10 mg by mouth daily.     EYE DROPS ADVANCED RELIEF 0.05-0.1-1-1 % Soln  Generic drug:  Tetrahydroz-Dextran-PEG-Povid  Place 1 drop into both eyes daily.     furosemide 40 MG tablet  Commonly known as:  LASIX  Take 40 mg by mouth daily.     glipiZIDE 10 MG tablet  Commonly known as:  GLUCOTROL  Take 5-10 mg by mouth 2 (two) times daily. Pt takes one tablet in the morning and one-half tablet in the evening.     HUMULIN R 500 UNIT/ML injection  Generic drug:  insulin regular human CONCENTRATED  Inject 4-6 Units into the skin 2 (two) times daily with a meal. Pt uses four units with breakfast and six units with dinner.     lisinopril 40 MG tablet  Commonly known as:  PRINIVIL,ZESTRIL  Take 40 mg by mouth at bedtime.     metoprolol succinate 25 MG 24 hr tablet  Commonly known as:  TOPROL-XL  Take 1 tablet (25 mg total) by mouth every evening. Take with or immediately following a meal.     predniSONE 10 MG tablet  Commonly known as:  DELTASONE  Take 4 tablets daily for 3 days then 2 tablets daily for 3 days then 1 tablet daily for 3 days then stop     PRESERVISION AREDS 2 Caps  Take 1 capsule by mouth 2 (two) times daily.     saxagliptin HCl 2.5 MG Tabs tablet  Commonly known as:  ONGLYZA  Take 5 mg by mouth daily.     warfarin 6 MG tablet  Commonly known as:  COUMADIN  Take 6 mg by mouth every evening. Pt takes on Monday, Wednesday, Friday, and Sunday.     warfarin 3 MG tablet  Commonly known as:  COUMADIN  Take 3 mg by mouth every evening. Pt takes on Tuesday, Thursday, and Saturday.        Disposition:  Discharge Instructions    Diet - low sodium heart healthy    Complete by:  As directed      Increase activity slowly    Complete by:  As directed           Follow-up Information    [facility] On [facility].   Specialty:   Cardiology   Why:  at 10:30AM for wound check    Contact information:   [address], [address] [place] [id number] [phone]      Follow up with Barbette ReichmannHANDE,VISHWANATH, MD In 1 week.   Specialty:  Internal Medicine   Why:  for coumadin check and follow up on shortness of breath   Contact information:   [address] [facility] [place]  [place] [id number] [phone]       Follow up with Sherryl MangesSteven Klein,  MD In 3 months.   Specialty:  Cardiology   Why:  office Jaylinn Hellenbrand arrange at wound check appt    Contact information:   [address] [place] [address] [phone]       Duration of Discharge Encounter: Greater than 30 minutes including physician time.  Signed, Gypsy Balsam, NP 08/09/2015 8:09 AM   I have seen and examined this patient with Gypsy Balsam.  Agree with above, note added to reflect my findings.  On exam, regular rhythm, no murmurs, lungs clear.  Had single lead pacemaker placed for tachy-brady syndrome.  Feeling well without major complaint this AM.  Plan for discharge today if able to ambulate with follow up in device clinic in 10 days.    Taedyn Glasscock M. Shandrell Boda MD 08/09/2015 9:52 AM

## 2015-08-09 NOTE — Progress Notes (Signed)
Walked in patient's room and safety mitts were on the ground. Pt had blood on his right hand and deep scratch marks noted. Pt stated, "I tried to get my mitts off."

## 2015-08-20 ENCOUNTER — Ambulatory Visit: Payer: Medicare Other | Attending: Family | Admitting: Family

## 2015-08-20 ENCOUNTER — Encounter (INDEPENDENT_AMBULATORY_CARE_PROVIDER_SITE_OTHER): Payer: Self-pay

## 2015-08-20 ENCOUNTER — Encounter: Payer: Self-pay | Admitting: Family

## 2015-08-20 VITALS — BP 123/65 | HR 75 | Resp 20 | Ht 69.0 in | Wt 225.0 lb

## 2015-08-20 DIAGNOSIS — Z8249 Family history of ischemic heart disease and other diseases of the circulatory system: Secondary | ICD-10-CM | POA: Insufficient documentation

## 2015-08-20 DIAGNOSIS — I4891 Unspecified atrial fibrillation: Secondary | ICD-10-CM | POA: Diagnosis not present

## 2015-08-20 DIAGNOSIS — Z794 Long term (current) use of insulin: Secondary | ICD-10-CM | POA: Insufficient documentation

## 2015-08-20 DIAGNOSIS — I341 Nonrheumatic mitral (valve) prolapse: Secondary | ICD-10-CM | POA: Insufficient documentation

## 2015-08-20 DIAGNOSIS — I482 Chronic atrial fibrillation, unspecified: Secondary | ICD-10-CM

## 2015-08-20 DIAGNOSIS — Z9889 Other specified postprocedural states: Secondary | ICD-10-CM | POA: Insufficient documentation

## 2015-08-20 DIAGNOSIS — Z79899 Other long term (current) drug therapy: Secondary | ICD-10-CM | POA: Diagnosis not present

## 2015-08-20 DIAGNOSIS — Z7901 Long term (current) use of anticoagulants: Secondary | ICD-10-CM | POA: Diagnosis not present

## 2015-08-20 DIAGNOSIS — R0602 Shortness of breath: Secondary | ICD-10-CM | POA: Diagnosis not present

## 2015-08-20 DIAGNOSIS — I495 Sick sinus syndrome: Secondary | ICD-10-CM | POA: Diagnosis not present

## 2015-08-20 DIAGNOSIS — I110 Hypertensive heart disease with heart failure: Secondary | ICD-10-CM | POA: Insufficient documentation

## 2015-08-20 DIAGNOSIS — Z87891 Personal history of nicotine dependence: Secondary | ICD-10-CM | POA: Diagnosis not present

## 2015-08-20 DIAGNOSIS — Z950 Presence of cardiac pacemaker: Secondary | ICD-10-CM | POA: Diagnosis not present

## 2015-08-20 DIAGNOSIS — Z9049 Acquired absence of other specified parts of digestive tract: Secondary | ICD-10-CM | POA: Insufficient documentation

## 2015-08-20 DIAGNOSIS — G20 Parkinson's disease: Secondary | ICD-10-CM | POA: Diagnosis not present

## 2015-08-20 DIAGNOSIS — E039 Hypothyroidism, unspecified: Secondary | ICD-10-CM | POA: Diagnosis not present

## 2015-08-20 DIAGNOSIS — Z8673 Personal history of transient ischemic attack (TIA), and cerebral infarction without residual deficits: Secondary | ICD-10-CM | POA: Diagnosis not present

## 2015-08-20 DIAGNOSIS — I5022 Chronic systolic (congestive) heart failure: Secondary | ICD-10-CM | POA: Insufficient documentation

## 2015-08-20 DIAGNOSIS — E119 Type 2 diabetes mellitus without complications: Secondary | ICD-10-CM | POA: Insufficient documentation

## 2015-08-20 NOTE — Patient Instructions (Signed)
Begin weighing daily and call for an overnight weight gain of > 2 pounds or a weekly weight gain of >5 pounds. 

## 2015-08-20 NOTE — Progress Notes (Signed)
Subjective:    Patient ID: Troy [name], male    DOB: 12-May-1923, 82 y.o.   MRN: [id number]  Congestive Heart Failure Presents for initial visit. The disease course has been improving. Associated symptoms include fatigue, palpitations (at times) and shortness of breath. Pertinent negatives include no abdominal pain, chest pain, chest pressure, edema or orthopnea. The symptoms have been improving. Past treatments include ACE inhibitors, beta blockers, salt and fluid restriction and oxygen. The treatment provided moderate relief. Compliance with prior treatments has been good. His past medical history is significant for arrhythmia, CVA, DM, HTN and valvular heart disease. He has multiple 1st degree relatives with heart disease.  Hypertension This is a chronic problem. The current episode started more than 1 year ago. The problem has been gradually improving since onset. The problem is controlled. Associated symptoms include palpitations (at times) and shortness of breath. Pertinent negatives include no chest pain, headaches, neck pain or peripheral edema. There are no associated agents to hypertension. Risk factors for coronary artery disease include diabetes mellitus, family history and male gender. Past treatments include ACE inhibitors, beta blockers, diuretics and lifestyle changes. The current treatment provides moderate improvement. There are no compliance problems.  Hypertensive end-organ damage includes CVA and heart failure.    Past Medical History  Diagnosis Date  . Diabetes mellitus without complication (HCC)   . Hypertension   . UTI (lower urinary tract infection)   . Parkinson's disease (HCC)   . TIA (transient ischemic attack)   . Chronic atrial fibrillation (HCC)     a. on Couamdin; b. CHADS2VASc at least 7 (CHF, HTN, age x 2, DM, stroke x 2); c. s/p MAZE 1999  . Chronic systolic CHF (congestive heart failure) (HCC)     a. echo 08/01/2015: EF of 45%, mild LVH, mitral valve with  annular calcification, mitral valve partially mobile  . Stroke (HCC)   . Hypothyroidism   . Normal coronary arteries     a. by cardiac cath 1999  . Mitral valve prolapse     a. s/p mitral valve repair 1999    Past Surgical History  Procedure Laterality Date  . Cardiac surgery    . Cholecystectomy    . Cardiac catheterization N/A 08/08/2015    Procedure: Temporary Pacemaker;  Surgeon: Peter M Swaziland, MD;  Location[facility] INVASIVE CV LAB;  Service: Cardiovascular;  Laterality: N/A;  . Ep implantable device N/A 08/08/2015    Procedure: Pacemaker Implant;  Surgeon: Will Jorja Loa, MD;  Location: [facility] INVASIVE CV LAB;  Service: Cardiovascular;  Laterality: N/A;    Family History  Problem Relation Age of Onset  . Hypertension Mother   . Stroke Mother   . CAD Father   . Lymphoma Sister   . Lung disease Brother     Social History  Substance Use Topics  . Smoking status: Former Smoker -- 4.00 packs/day for 20 years    Types: Cigarettes  . Smokeless tobacco: Never Used  . Alcohol Use: No    No Known Allergies  Prior to Admission medications   Medication Sig Start Date End Date Taking? Authorizing Provider  albuterol (PROVENTIL HFA) 108 (90 Base) MCG/ACT inhaler Inhale 2 puffs into the lungs every 4 (four) hours as needed for wheezing or shortness of breath. 07/11/15  Yes Sharman Cheek, MD  carbidopa-levodopa (SINEMET IR) 25-250 MG tablet Take 1 tablet by mouth 3 (three) times daily.   Yes Historical Provider, MD  cetirizine (ZYRTEC) 10 MG tablet Take 10 mg  by mouth daily.   Yes Historical Provider, MD  furosemide (LASIX) 40 MG tablet Take 40 mg by mouth daily.   Yes Historical Provider, MD  glipiZIDE (GLUCOTROL) 10 MG tablet Take 5-10 mg by mouth 2 (two) times daily. Pt takes one tablet in the morning and one-half tablet in the evening.   Yes Historical Provider, MD  insulin regular human CONCENTRATED (HUMULIN R) 500 UNIT/ML injection Inject 4-6 Units into the skin 2 (two) times daily  with a meal. Pt uses four units with breakfast and six units with dinner.   Yes Historical Provider, MD  lisinopril (PRINIVIL,ZESTRIL) 40 MG tablet Take 40 mg by mouth at bedtime.    Yes Historical Provider, MD  metoprolol succinate (TOPROL-XL) 25 MG 24 hr tablet Take 1 tablet (25 mg total) by mouth every evening. Take with or immediately following a meal. 08/06/15  Yes Vipul Sherryll Burger, MD  Multiple Vitamins-Minerals (PRESERVISION AREDS 2) CAPS Take 1 capsule by mouth 2 (two) times daily.   Yes Historical Provider, MD  saxagliptin HCl (ONGLYZA) 2.5 MG TABS tablet Take 5 mg by mouth daily.   Yes Historical Provider, MD  Tetrahydroz-Dextran-PEG-Povid (EYE DROPS ADVANCED RELIEF) 0.05-0.1-1-1 % SOLN Place 1 drop into both eyes daily.   Yes Historical Provider, MD  warfarin (COUMADIN) 3 MG tablet Take 3 mg by mouth every evening. Pt takes on Tuesday, Thursday, and Saturday.   Yes Historical Provider, MD  warfarin (COUMADIN) 6 MG tablet Take 6 mg by mouth every evening. Pt takes on Monday, Wednesday, Friday, and Sunday.   Yes Historical Provider, MD     Review of Systems  Constitutional: Positive for fatigue. Negative for appetite change.  HENT: Negative for congestion, rhinorrhea and sore throat.   Eyes: Positive for visual disturbance (due to macular degeneration). Negative for pain.  Respiratory: Positive for shortness of breath. Negative for cough, chest tightness and wheezing.   Cardiovascular: Positive for palpitations (at times) and leg swelling (trace amount). Negative for chest pain.  Gastrointestinal: Negative for abdominal pain and abdominal distention.  Endocrine: Negative.   Genitourinary: Negative.   Musculoskeletal: Negative for back pain and neck pain.  Skin: Negative.   Neurological: Negative for dizziness, light-headedness and headaches.  Hematological: Negative for adenopathy. Bruises/bleeds easily.  Psychiatric/Behavioral: Negative for sleep disturbance (sleeping on 2 pillows with  oxygen at 2L) and dysphoric mood. The patient is not nervous/anxious.        Objective:   Physical Exam  Constitutional: He is oriented to person, place, and time. He appears well-developed and well-nourished.  HENT:  Head: Normocephalic and atraumatic.  Eyes: Conjunctivae are normal. Pupils are equal, round, and reactive to light.  Neck: Normal range of motion. Neck supple.  Cardiovascular: Normal rate.  An irregular rhythm present.  Pulmonary/Chest: Effort normal. He has no wheezes. He has no rales.  Abdominal: Soft. He exhibits no distension. There is no tenderness.  Musculoskeletal: He exhibits edema (trace amount left ankle). He exhibits no tenderness.  Neurological: He is alert and oriented to person, place, and time.  Skin: Skin is warm and dry.  Psychiatric: He has a normal mood and affect. His behavior is normal. Thought content normal.  Nursing note and vitals reviewed.  BP 123/65 mmHg  Pulse 75  Resp 20  Ht  (1.753 m)  Wt 225 lb (102.059 kg)  BMI 33.21 kg/m2  SpO2 95%        Assessment & Plan:  1: Chronic heart failure with reduced ejection fraction- Patient presents  with fatigue and shortness of breath upon exertion. He came into the office in a wheelchair because he says that it would have been too far for him to walk. He does have a caregiver who does exercises with him. When he walks in the house, he uses a wheelchair or a cane. He is not weighing himself daily but does have scales at home. Discussed the importance of weighing each morning after using the bathroom and to call for an overnight weight gain >2 pounds or a weekly weight gain of >5 pounds. He is not adding any salt to his food and his wife says that she reads food labels "sometimes". Discussed the importance of following a 2000mg  sodium diet and written information was given to them about this. EF is currently 45%. Does wear oxygen at 2L at bedtime and when he's at home.  2: Atrial fibrillation-  Currently rate controlled at this time with toprol. Takes alternating doses of warfarin. 3: Sick sinus syndrome- Has had a pacemaker placed recently and follows-up with EP in two days. Is on low dose toprol and wife is concerned about him being on the medication as she was told that the toprol was the cause of his low heart rate. Discussed the use of toprol and the potential benefits of him being on it even at a low dose. Pacemaker should not allow the heart rate to get too low and patient's wife is more comfortable with the use of toprol. 4: Diabetes- Caregiver reports that his glucose level was 117 this morning. Takes glipizide, onglyza and insulin with meals. Follows closely with his PCP regarding this.  Medication list was reviewed with the wife and caregiver.  Return here in 1 month or sooner for any questions/problems before then.

## 2015-08-22 ENCOUNTER — Encounter: Payer: Self-pay | Admitting: Cardiology

## 2015-08-22 ENCOUNTER — Ambulatory Visit: Payer: Medicare Other | Admitting: *Deleted

## 2015-08-22 DIAGNOSIS — Z950 Presence of cardiac pacemaker: Secondary | ICD-10-CM

## 2015-08-22 LAB — CUP PACEART INCLINIC DEVICE CHECK
Battery Remaining Longevity: 125 mo
Battery Voltage: 3.11 V
Implantable Lead Implant Date: 20170405
Implantable Lead Location: 753860
Implantable Lead Model: 5076
Lead Channel Pacing Threshold Amplitude: 0.5 V
Lead Channel Pacing Threshold Pulse Width: 0.4 ms
Lead Channel Setting Sensing Sensitivity: 2.8 mV
MDC IDC MSMT LEADCHNL RV IMPEDANCE VALUE: 475 Ohm
MDC IDC MSMT LEADCHNL RV IMPEDANCE VALUE: 551 Ohm
MDC IDC MSMT LEADCHNL RV SENSING INTR AMPL: 6.75 mV
MDC IDC SESS DTM: 20170419145007
MDC IDC SET LEADCHNL RV PACING AMPLITUDE: 3.5 V
MDC IDC SET LEADCHNL RV PACING PULSEWIDTH: 0.4 ms
MDC IDC STAT BRADY RV PERCENT PACED: 31.97 %

## 2015-08-22 NOTE — Progress Notes (Signed)
Pacemaker check in clinic. Normal device function. Thresholds, sensing, impedances consistent with previous measurements. Device programmed to maximize longevity. 22 mode switches (<0.1%), available EGM suggests 1:1 SVT, lasting 498min:26. 56 Ventricular high rate episodes, all EGM/markers appear to be 1:1 SVT, longest lasting 12sec, peak V 208. Device programed to appropriate safety margins. Exertion response increased to 4 and activity deceleration set at 5min based on patient's reported exercise intolerance. Histogram distribution appropriate for patient activity level. Device programmed to optimize intrinsic conduction. Estimated longevity 5.5-7262yrs. [facility] 5/15 and [facility]. Patient education completed.

## 2015-09-12 ENCOUNTER — Encounter: Payer: Self-pay | Admitting: Family

## 2015-09-12 ENCOUNTER — Ambulatory Visit: Payer: Medicare Other | Attending: Family | Admitting: Family

## 2015-09-12 VITALS — BP 136/59 | HR 88 | Resp 20 | Ht 69.0 in | Wt 230.0 lb

## 2015-09-12 DIAGNOSIS — Z8249 Family history of ischemic heart disease and other diseases of the circulatory system: Secondary | ICD-10-CM | POA: Diagnosis not present

## 2015-09-12 DIAGNOSIS — Z794 Long term (current) use of insulin: Secondary | ICD-10-CM | POA: Insufficient documentation

## 2015-09-12 DIAGNOSIS — I5022 Chronic systolic (congestive) heart failure: Secondary | ICD-10-CM | POA: Diagnosis not present

## 2015-09-12 DIAGNOSIS — R002 Palpitations: Secondary | ICD-10-CM | POA: Insufficient documentation

## 2015-09-12 DIAGNOSIS — I5032 Chronic diastolic (congestive) heart failure: Secondary | ICD-10-CM

## 2015-09-12 DIAGNOSIS — E039 Hypothyroidism, unspecified: Secondary | ICD-10-CM | POA: Diagnosis not present

## 2015-09-12 DIAGNOSIS — Z807 Family history of other malignant neoplasms of lymphoid, hematopoietic and related tissues: Secondary | ICD-10-CM | POA: Diagnosis not present

## 2015-09-12 DIAGNOSIS — I110 Hypertensive heart disease with heart failure: Secondary | ICD-10-CM | POA: Insufficient documentation

## 2015-09-12 DIAGNOSIS — Z7902 Long term (current) use of antithrombotics/antiplatelets: Secondary | ICD-10-CM | POA: Diagnosis not present

## 2015-09-12 DIAGNOSIS — R062 Wheezing: Secondary | ICD-10-CM | POA: Insufficient documentation

## 2015-09-12 DIAGNOSIS — Z7984 Long term (current) use of oral hypoglycemic drugs: Secondary | ICD-10-CM | POA: Insufficient documentation

## 2015-09-12 DIAGNOSIS — R5383 Other fatigue: Secondary | ICD-10-CM | POA: Diagnosis not present

## 2015-09-12 DIAGNOSIS — R05 Cough: Secondary | ICD-10-CM | POA: Diagnosis not present

## 2015-09-12 DIAGNOSIS — Z79899 Other long term (current) drug therapy: Secondary | ICD-10-CM | POA: Diagnosis not present

## 2015-09-12 DIAGNOSIS — E119 Type 2 diabetes mellitus without complications: Secondary | ICD-10-CM | POA: Diagnosis not present

## 2015-09-12 DIAGNOSIS — Z7901 Long term (current) use of anticoagulants: Secondary | ICD-10-CM | POA: Diagnosis not present

## 2015-09-12 DIAGNOSIS — Z836 Family history of other diseases of the respiratory system: Secondary | ICD-10-CM | POA: Insufficient documentation

## 2015-09-12 DIAGNOSIS — Z9889 Other specified postprocedural states: Secondary | ICD-10-CM | POA: Insufficient documentation

## 2015-09-12 DIAGNOSIS — Z7952 Long term (current) use of systemic steroids: Secondary | ICD-10-CM | POA: Diagnosis not present

## 2015-09-12 DIAGNOSIS — J441 Chronic obstructive pulmonary disease with (acute) exacerbation: Secondary | ICD-10-CM | POA: Insufficient documentation

## 2015-09-12 DIAGNOSIS — I482 Chronic atrial fibrillation, unspecified: Secondary | ICD-10-CM

## 2015-09-12 DIAGNOSIS — G20 Parkinson's disease: Secondary | ICD-10-CM | POA: Diagnosis not present

## 2015-09-12 DIAGNOSIS — R531 Weakness: Secondary | ICD-10-CM | POA: Diagnosis not present

## 2015-09-12 DIAGNOSIS — Z87891 Personal history of nicotine dependence: Secondary | ICD-10-CM | POA: Diagnosis not present

## 2015-09-12 DIAGNOSIS — I341 Nonrheumatic mitral (valve) prolapse: Secondary | ICD-10-CM | POA: Diagnosis not present

## 2015-09-12 DIAGNOSIS — R0602 Shortness of breath: Secondary | ICD-10-CM | POA: Diagnosis not present

## 2015-09-12 DIAGNOSIS — Z8673 Personal history of transient ischemic attack (TIA), and cerebral infarction without residual deficits: Secondary | ICD-10-CM | POA: Insufficient documentation

## 2015-09-12 DIAGNOSIS — Z950 Presence of cardiac pacemaker: Secondary | ICD-10-CM | POA: Insufficient documentation

## 2015-09-12 NOTE — Patient Instructions (Signed)
Begin weighing daily and call for an overnight weight gain of > 2 pounds or a weekly weight gain of >5 pounds. 

## 2015-09-12 NOTE — Progress Notes (Signed)
Subjective:    Patient ID: Troy [name], male    DOB: 10-Jul-1923, 82 y.o.   MRN: [id number]  Congestive Heart Failure Presents for follow-up visit. The disease course has been fluctuating. Associated symptoms include fatigue, palpitations and shortness of breath. Pertinent negatives include no abdominal pain, chest pain, chest pressure, edema, orthopnea or paroxysmal nocturnal dyspnea. The symptoms have been improving. Past treatments include salt and fluid restriction, oxygen, ACE inhibitors and beta blockers. The treatment provided moderate relief. Compliance with prior treatments has been good. His past medical history is significant for arrhythmia, chronic lung disease, CVA, DM and HTN. He has one 1st degree relative with heart disease.  Atrial Fibrillation Presents for follow-up visit. Symptoms include palpitations, shortness of breath and weakness. Symptoms are negative for bradycardia, chest pain, dizziness and hypertension. The symptoms have been stable. Past treatments include beta blockers and warfarin. Compliance with prior treatments has been good. Past medical history includes atrial fibrillation, CHF and HTN. There are no medication compliance problems.  Wheezing  This is a recurrent problem. The current episode started in the past 7 days. The problem occurs daily. The problem has been gradually improving. Associated symptoms include coughing and shortness of breath. Pertinent negatives include no abdominal pain, chest pain, chills, fever, headaches, neck pain, rash or sore throat. The symptoms are aggravated by any activity and lying flat. He has tried beta agonist inhalers and oral steroids for the symptoms. The treatment provided moderate relief. His past medical history is significant for chronic lung disease and heart failure.   Past Medical History  Diagnosis Date  . Diabetes mellitus without complication (HCC)   . Hypertension   . UTI (lower urinary tract infection)   .  Parkinson's disease (HCC)   . TIA (transient ischemic attack)   . Chronic atrial fibrillation (HCC)     a. on Couamdin; b. CHADS2VASc at least 7 (CHF, HTN, age x 2, DM, stroke x 2); c. s/p MAZE 1999  . Chronic systolic CHF (congestive heart failure) (HCC)     a. echo 08/01/2015: EF of 45%, mild LVH, mitral valve with annular calcification, mitral valve partially mobile  . Stroke (HCC)   . Hypothyroidism   . Normal coronary arteries     a. by cardiac cath 1999  . Mitral valve prolapse     a. s/p mitral valve repair 1999    Past Surgical History  Procedure Laterality Date  . Cardiac surgery    . Cholecystectomy    . Cardiac catheterization N/A 08/08/2015    Procedure: Temporary Pacemaker;  Surgeon: Peter M SwazilandJordan, MD;  [facility];  Service: Cardiovascular;  Laterality: N/A;  . Ep implantable device N/A 08/08/2015    Procedure: Pacemaker Implant;  Surgeon: Will Jorja LoaMartin Camnitz, MD;  Location: [facility] INVASIVE CV LAB;  Service: Cardiovascular;  Laterality: N/A;    Family History  Problem Relation Age of Onset  . Hypertension Mother   . Stroke Mother   . CAD Father   . Lymphoma Sister   . Lung disease Brother     Social History  Substance Use Topics  . Smoking status: Former Smoker -- 4.00 packs/day for 20 years    Types: Cigarettes  . Smokeless tobacco: Never Used  . Alcohol Use: No    No Known Allergies  Prior to Admission medications   Medication Sig Start Date End Date Taking? Authorizing Provider  albuterol (PROVENTIL HFA) 108 (90 Base) MCG/ACT inhaler Inhale 2 puffs into the lungs  every 4 (four) hours as needed for wheezing or shortness of breath. 07/11/15  Yes Sharman Cheek, MD  albuterol (PROVENTIL) (2.5 MG/3ML) 0.083% nebulizer solution Take 2.5 mg by nebulization every 6 (six) hours as needed for wheezing or shortness of breath.   Yes Historical Provider, MD  carbidopa-levodopa (SINEMET IR) 25-250 MG tablet Take 1 tablet by mouth 3 (three) times daily.    Yes Historical Provider, MD  cetirizine (ZYRTEC) 10 MG tablet Take 10 mg by mouth daily.   Yes Historical Provider, MD  furosemide (LASIX) 40 MG tablet Take 40 mg by mouth 2 (two) times daily.    Yes Historical Provider, MD  glipiZIDE (GLUCOTROL) 10 MG tablet Take 5-10 mg by mouth 2 (two) times daily. Pt takes one tablet in the morning and one-half tablet in the evening.   Yes Historical Provider, MD  insulin regular human CONCENTRATED (HUMULIN R) 500 UNIT/ML injection Inject 4-6 Units into the skin 2 (two) times daily with a meal. Pt uses four units with breakfast and six units with dinner.   Yes Historical Provider, MD  lisinopril (PRINIVIL,ZESTRIL) 40 MG tablet Take 40 mg by mouth at bedtime.    Yes Historical Provider, MD  metoprolol succinate (TOPROL-XL) 25 MG 24 hr tablet Take 1 tablet (25 mg total) by mouth every evening. Take with or immediately following a meal. 08/06/15  Yes Vipul Sherryll Burger, MD  Multiple Vitamins-Minerals (PRESERVISION AREDS 2) CAPS Take 1 capsule by mouth 2 (two) times daily.   Yes Historical Provider, MD  predniSONE (DELTASONE) 10 MG tablet Take 10 mg by mouth as directed. Start with 3 tablets daily Tuesday, Wed and Thurs reduce to 2 tablets daily Fri, Sat and Sun, reduce to 1 tablet daily Mon,  [employer], and Wed 09/11/15 09/19/15 Yes Historical Provider, MD  saxagliptin HCl (ONGLYZA) 2.5 MG TABS tablet Take 5 mg by mouth daily.   Yes Historical Provider, MD  Tetrahydroz-Dextran-PEG-Povid (EYE DROPS ADVANCED RELIEF) 0.05-0.1-1-1 % SOLN Place 1 drop into both eyes daily.   Yes Historical Provider, MD  warfarin (COUMADIN) 3 MG tablet Take 3 mg by mouth every evening. Pt takes on Tuesday, Thursday, and Saturday.   Yes Historical Provider, MD  warfarin (COUMADIN) 6 MG tablet Take 6 mg by mouth every evening. Pt takes on Monday, Wednesday, Friday, and Sunday.   Yes Historical Provider, MD      Review of Systems  Constitutional: Positive for fatigue. Negative for fever, chills and appetite  change.  HENT: Negative for congestion, postnasal drip and sore throat.   Eyes: Negative.   Respiratory: Positive for cough, shortness of breath and wheezing. Negative for chest tightness.   Cardiovascular: Positive for palpitations. Negative for chest pain and leg swelling.  Gastrointestinal: Negative for abdominal pain and abdominal distention.  Endocrine: Negative.   Genitourinary: Negative.   Musculoskeletal: Negative for back pain and neck pain.  Skin: Negative.  Negative for rash.  Allergic/Immunologic: Negative.   Neurological: Positive for weakness. Negative for dizziness, light-headedness and headaches.  Hematological: Negative for adenopathy. Does not bruise/bleed easily.  Psychiatric/Behavioral: Negative for sleep disturbance (sleeping on 2 pillows with oxygen at rest at 2L) and dysphoric mood. The patient is not nervous/anxious.        Objective:   Physical Exam  Constitutional: He is oriented to person, place, and time. He appears well-developed and well-nourished.  HENT:  Head: Normocephalic and atraumatic.  Eyes: Conjunctivae are normal. Pupils are equal, round, and reactive to light.  Neck: Normal range of motion. Neck  supple.  Cardiovascular: Normal rate.  An irregular rhythm present.  Pulmonary/Chest: Effort normal. He has wheezes in the right lower field and the left lower field. He has no rhonchi. He has no rales.  Abdominal: Soft. He exhibits no distension. There is no tenderness.  Musculoskeletal: He exhibits no edema or tenderness.  Neurological: He is alert and oriented to person, place, and time.  Skin: Skin is warm and dry.  Psychiatric: He has a normal mood and affect. His behavior is normal. Thought content normal.  Nursing note and vitals reviewed.   BP 136/59 mmHg  Pulse 88  Resp 20  Ht  (1.753 m)  Wt 230 lb (104.327 kg)  BMI 33.95 kg/m2  SpO2 93%       Assessment & Plan:  1: Chronic heart failure with preserved ejection fraction-  Patient presents with fatigue and shortness of breath upon exertion. He came into the office in a wheelchair because he says that he wouldn't have been able to walk this far. He had been doing more exercises at home until yesterday. He has been weighing himself but not on a daily basis. Discussed the importance of weighing daily and to call for an overnight weight gain of >2 pounds or a weekly weight gain of >5 pounds. He says that his home weight has ranged from 225-227 pounds. By our scale, he's gained 5 pounds since he was here last month. His furosemide has been doubled beginning yesterday for the next week. He is not adding any salt to his food and his trying to eat low sodium foods. 2: Atrial fibrillation- Currently rate controlled at this time with taking toprol xl. He also takes alternating doses of warfarin. 3: COPD exacerbation- He saw his PCP yesterday due to wheezing and increased work of breathing. Prednisone was started and he, his wife and caregiver say they all notice a big improvement in the last 24 hours. He has a followup appointment with his PCP in 2 weeks.  Return here in 3 months or sooner for any questions/problems before then.

## 2015-09-20 ENCOUNTER — Ambulatory Visit: Payer: Medicare Other | Admitting: Family

## 2015-09-24 ENCOUNTER — Encounter: Payer: Self-pay | Admitting: Internal Medicine

## 2015-09-24 ENCOUNTER — Emergency Department: Payer: Medicare Other

## 2015-09-24 ENCOUNTER — Inpatient Hospital Stay
Admission: EM | Admit: 2015-09-24 | Discharge: 2015-09-28 | DRG: 190 | Disposition: A | Discharge status: Home / Self Care | Payer: Medicare Other | Attending: Internal Medicine | Admitting: Internal Medicine

## 2015-09-24 DIAGNOSIS — I10 Essential (primary) hypertension: Secondary | ICD-10-CM | POA: Diagnosis present

## 2015-09-24 DIAGNOSIS — Z8673 Personal history of transient ischemic attack (TIA), and cerebral infarction without residual deficits: Secondary | ICD-10-CM

## 2015-09-24 DIAGNOSIS — E119 Type 2 diabetes mellitus without complications: Secondary | ICD-10-CM

## 2015-09-24 DIAGNOSIS — J441 Chronic obstructive pulmonary disease with (acute) exacerbation: Secondary | ICD-10-CM | POA: Diagnosis not present

## 2015-09-24 DIAGNOSIS — Z9981 Dependence on supplemental oxygen: Secondary | ICD-10-CM

## 2015-09-24 DIAGNOSIS — E1165 Type 2 diabetes mellitus with hyperglycemia: Secondary | ICD-10-CM | POA: Diagnosis present

## 2015-09-24 DIAGNOSIS — I5043 Acute on chronic combined systolic (congestive) and diastolic (congestive) heart failure: Secondary | ICD-10-CM | POA: Diagnosis present

## 2015-09-24 DIAGNOSIS — J9621 Acute and chronic respiratory failure with hypoxia: Secondary | ICD-10-CM | POA: Diagnosis present

## 2015-09-24 DIAGNOSIS — I130 Hypertensive heart and chronic kidney disease with heart failure and stage 1 through stage 4 chronic kidney disease, or unspecified chronic kidney disease: Secondary | ICD-10-CM | POA: Diagnosis present

## 2015-09-24 DIAGNOSIS — Z66 Do not resuscitate: Secondary | ICD-10-CM | POA: Diagnosis present

## 2015-09-24 DIAGNOSIS — G20 Parkinson's disease: Secondary | ICD-10-CM | POA: Diagnosis present

## 2015-09-24 DIAGNOSIS — Z7901 Long term (current) use of anticoagulants: Secondary | ICD-10-CM

## 2015-09-24 DIAGNOSIS — I509 Heart failure, unspecified: Secondary | ICD-10-CM

## 2015-09-24 DIAGNOSIS — Z87891 Personal history of nicotine dependence: Secondary | ICD-10-CM

## 2015-09-24 DIAGNOSIS — Z794 Long term (current) use of insulin: Secondary | ICD-10-CM

## 2015-09-24 DIAGNOSIS — N183 Chronic kidney disease, stage 3 (moderate): Secondary | ICD-10-CM | POA: Diagnosis present

## 2015-09-24 DIAGNOSIS — I5033 Acute on chronic diastolic (congestive) heart failure: Secondary | ICD-10-CM | POA: Insufficient documentation

## 2015-09-24 DIAGNOSIS — Z79899 Other long term (current) drug therapy: Secondary | ICD-10-CM

## 2015-09-24 DIAGNOSIS — I482 Chronic atrial fibrillation, unspecified: Secondary | ICD-10-CM | POA: Diagnosis present

## 2015-09-24 DIAGNOSIS — E1122 Type 2 diabetes mellitus with diabetic chronic kidney disease: Secondary | ICD-10-CM | POA: Diagnosis present

## 2015-09-24 DIAGNOSIS — E039 Hypothyroidism, unspecified: Secondary | ICD-10-CM | POA: Diagnosis present

## 2015-09-24 HISTORY — DX: Chronic obstructive pulmonary disease, unspecified: J44.9

## 2015-09-24 HISTORY — DX: Chronic kidney disease, stage 3 unspecified: N18.30

## 2015-09-24 HISTORY — DX: Chronic kidney disease, stage 3 (moderate): N18.3

## 2015-09-24 LAB — BASIC METABOLIC PANEL
Anion gap: 6 (ref 5–15)
BUN: 26 mg/dL — AB (ref 6–20)
CALCIUM: 8.7 mg/dL — AB (ref 8.9–10.3)
CHLORIDE: 102 mmol/L (ref 101–111)
CO2: 32 mmol/L (ref 22–32)
CREATININE: 1.27 mg/dL — AB (ref 0.61–1.24)
GFR calc Af Amer: 56 mL/min — ABNORMAL LOW (ref >60)
GFR calc non Af Amer: 48 mL/min — ABNORMAL LOW (ref >60)
Glucose, Bld: 120 mg/dL — ABNORMAL HIGH (ref 65–99)
Potassium: 4 mmol/L (ref 3.5–5.1)
SODIUM: 140 mmol/L (ref 135–145)

## 2015-09-24 LAB — CBC WITH DIFFERENTIAL/PLATELET
Basophils Absolute: 0.1 10*3/uL (ref 0–0.1)
EOS ABS: 0.8 10*3/uL — AB (ref 0–0.7)
HEMATOCRIT: 38.6 % — AB (ref 40.0–52.0)
Hemoglobin: 12.9 g/dL — ABNORMAL LOW (ref 13.0–18.0)
Lymphocytes Relative: 13 %
Lymphs Abs: 1.4 10*3/uL (ref 1.0–3.6)
MCH: 29.2 pg (ref 26.0–34.0)
MCHC: 33.4 g/dL (ref 32.0–36.0)
MCV: 87.3 fL (ref 80.0–100.0)
MONO ABS: 1 10*3/uL (ref 0.2–1.0)
Neutro Abs: 7.1 10*3/uL — ABNORMAL HIGH (ref 1.4–6.5)
Neutrophils Relative %: 68 %
PLATELETS: 164 10*3/uL (ref 150–440)
RBC: 4.42 MIL/uL (ref 4.40–5.90)
RDW: 15.9 % — AB (ref 11.5–14.5)
WBC: 10.4 10*3/uL (ref 3.8–10.6)

## 2015-09-24 LAB — TROPONIN I: TROPONIN I: 0.03 ng/mL (ref <0.031)

## 2015-09-24 LAB — BRAIN NATRIURETIC PEPTIDE: B NATRIURETIC PEPTIDE 5: 38 pg/mL (ref 0.0–100.0)

## 2015-09-24 MED ORDER — DEXTROSE 5 % IV SOLN
500.0000 mg | Freq: Once | INTRAVENOUS | Status: AC
  Administered 2015-09-25: 500 mg via INTRAVENOUS
  Filled 2015-09-24: qty 500

## 2015-09-24 MED ORDER — IPRATROPIUM-ALBUTEROL 0.5-2.5 (3) MG/3ML IN SOLN
9.0000 mL | Freq: Once | RESPIRATORY_TRACT | Status: AC
  Administered 2015-09-24: 9 mL via RESPIRATORY_TRACT
  Filled 2015-09-24: qty 9

## 2015-09-24 MED ORDER — FUROSEMIDE 10 MG/ML IJ SOLN: 40.0000 mg | Freq: Once | INTRAMUSCULAR | Status: DC

## 2015-09-24 MED ORDER — METHYLPREDNISOLONE SODIUM SUCC 125 MG IJ SOLR
125.0000 mg | Freq: Once | INTRAMUSCULAR | Status: AC
  Administered 2015-09-25: 125 mg via INTRAVENOUS
  Filled 2015-09-24: qty 2

## 2015-09-24 MED ORDER — ALBUTEROL SULFATE (2.5 MG/3ML) 0.083% IN NEBU
2.5000 mg | INHALATION_SOLUTION | Freq: Once | RESPIRATORY_TRACT | Status: AC
  Administered 2015-09-24: 2.5 mg via RESPIRATORY_TRACT
  Filled 2015-09-24: qty 3

## 2015-09-24 MED ORDER — FUROSEMIDE 10 MG/ML IJ SOLN
20.0000 mg | Freq: Once | INTRAMUSCULAR | Status: AC
  Administered 2015-09-25: 20 mg via INTRAVENOUS
  Filled 2015-09-24: qty 4

## 2015-09-24 NOTE — ED Notes (Addendum)
Pt to triage via w/c with audible wheezing; reports SHOB; denies pain; pt taken to room 17 and placed on card monitor and on O2 @2l /min via [place]; wife returns using neb PTA with only brief relief

## 2015-09-24 NOTE — ED Provider Notes (Signed)
[facility] Emergency Department Provider Note   ____________________________________________  Time seen: Approximately 1040 PM  I have reviewed the triage vital signs and the nursing notes.   HISTORY  Chief Complaint Shortness of Breath   HPI Troy [name] is a 82 y.o. male with a history of COPD as well as CHF was presenting to the emergency department today with difficulty breathing which has been worsening over the past 2 weeks. It is also associated with a cough productive of green sputum. The patient denies any pain. Denies any fevers or known sick contacts. Has been using albuterol at home every 4 hours with limited relief.Patient is on 2 L nasal cannula at home and is baseline. Patient was increased on his Lasix to 80 mg up to this past Friday at which point he was decreased back down to 60. This was done under the auspices of his primary care doctor, Dr. Marcello Fennel.     Past Medical History  Diagnosis Date  . Diabetes mellitus without complication (HCC)   . Hypertension   . UTI (lower urinary tract infection)   . Parkinson's disease (HCC)   . TIA (transient ischemic attack)   . Chronic atrial fibrillation (HCC)     a. on Couamdin; b. CHADS2VASc at least 7 (CHF, HTN, age x 2, DM, stroke x 2); c. s/p MAZE 1999  . Chronic systolic CHF (congestive heart failure) (HCC)     a. echo 08/01/2015: EF of 45%, mild LVH, mitral valve with annular calcification, mitral valve partially mobile  . Stroke (HCC)   . Hypothyroidism   . Normal coronary arteries     a. by cardiac cath 1999  . Mitral valve prolapse     a. s/p mitral valve repair 1999    Patient Active Problem List   Diagnosis Date Noted  . COPD exacerbation (HCC) 09/12/2015  . Diabetes (HCC) 08/20/2015  . SSS (sick sinus syndrome) (HCC) 08/07/2015  . Chronic atrial fibrillation (HCC)   . Sinus pause   . Sick sinus syndrome (HCC)   . CHF (congestive heart failure) (HCC) 08/03/2015  . Lower  extremity weakness 02/25/2015    Past Surgical History  Procedure Laterality Date  . Cardiac surgery    . Cholecystectomy    . Cardiac catheterization N/A 08/08/2015    Procedure: Temporary Pacemaker;  Surgeon: Peter M Swaziland, MD;  Location: [facility] INVASIVE CV LAB;  Service: Cardiovascular;  Laterality: N/A;  . Ep implantable device N/A 08/08/2015    Procedure: Pacemaker Implant;  Surgeon: Will Jorja Loa, MD;  Location: [facility] INVASIVE CV LAB;  Service: Cardiovascular;  Laterality: N/A;    Current Outpatient Rx  Name  Route  Sig  Dispense  Refill  . albuterol (PROVENTIL HFA) 108 (90 Base) MCG/ACT inhaler   Inhalation   Inhale 2 puffs into the lungs every 4 (four) hours as needed for wheezing or shortness of breath.   1 Inhaler   0   . albuterol (PROVENTIL) (2.5 MG/3ML) 0.083% nebulizer solution   Nebulization   Take 2.5 mg by nebulization every 6 (six) hours as needed for wheezing or shortness of breath.         . carbidopa-levodopa (SINEMET IR) 25-250 MG tablet   Oral   Take 1 tablet by mouth 3 (three) times daily.         . cetirizine (ZYRTEC) 10 MG tablet   Oral   Take 10 mg by mouth daily.         [name]  furosemide (LASIX) 20 MG tablet   Oral   Take 20 mg by mouth every evening.         . furosemide (LASIX) 40 MG tablet   Oral   Take 40 mg by mouth every morning.          [name] glipiZIDE (GLUCOTROL) 10 MG tablet   Oral   Take 5-10 mg by mouth 2 (two) times daily. Pt takes one tablet in the morning and one-half tablet in the evening.         . insulin regular human CONCENTRATED (HUMULIN R) 500 UNIT/ML injection   Subcutaneous   Inject 4-6 Units into the skin 2 (two) times daily with a meal. Pt uses four units with breakfast and six units with dinner.         [name] lisinopril (PRINIVIL,ZESTRIL) 40 MG tablet   Oral   Take 40 mg by mouth at bedtime.          . metoprolol succinate (TOPROL-XL) 25 MG 24 hr tablet   Oral   Take 1 tablet (25 mg total) by mouth every  evening. Take with or immediately following a meal.   30 tablet   0   . Multiple Vitamins-Minerals (PRESERVISION AREDS 2) CAPS   Oral   Take 1 capsule by mouth 2 (two) times daily.         . saxagliptin HCl (ONGLYZA) 2.5 MG TABS tablet   Oral   Take 5 mg by mouth every evening.         [name] (EYE DROPS ADVANCED RELIEF) 0.05-0.1-1-1 % SOLN   Both Eyes   Place 1 drop into both eyes daily.         [name] warfarin (COUMADIN) 3 MG tablet   Oral   Take 3 mg by mouth every evening. Pt takes on Tuesday, Thursday, and Saturday.         . warfarin (COUMADIN) 6 MG tablet   Oral   Take 6 mg by mouth every evening. Pt takes on Monday, Wednesday, Friday, and Sunday.           Allergies Review of patient's allergies indicates no known allergies.  Family History  Problem Relation Age of Onset  . Hypertension Mother   . Stroke Mother   . CAD Father   . Lymphoma Sister   . Lung disease Brother     Social History Social History  Substance Use Topics  . Smoking status: Former Smoker -- 4.00 packs/day for 20 years    Types: Cigarettes  . Smokeless tobacco: Never Used  . Alcohol Use: No    Review of Systems Constitutional: No fever/chills Eyes: No visual changes. ENT: No sore throat. Cardiovascular: Denies chest pain. Respiratory: As above Gastrointestinal: No abdominal pain.  No nausea, no vomiting.  No diarrhea.  No constipation. Genitourinary: Negative for dysuria. Musculoskeletal: Negative for back pain. Skin: Negative for rash. Neurological: Negative for headaches, focal weakness or numbness.  10-point ROS otherwise negative.  ____________________________________________   PHYSICAL EXAM:  VITAL SIGNS: ED Triage Vitals  Enc Vitals Group     BP 09/24/15 2150 125/67 mmHg     Pulse Rate 09/24/15 2150 78     Resp 09/24/15 2150 24     Temp 09/24/15 2150 98 F (36.7 C)     Temp Source 09/24/15 2150 Oral     SpO2 09/24/15 2150 95 %      Weight 09/24/15 2150 231 lb (104.781 kg)     Height  09/24/15 2150 5\' 9"  (1.753 m)     Head Cir --      Peak Flow --      Pain Score --      Pain Loc --      Pain Edu? --      Excl. in GC? --     Constitutional: Alert and oriented. Well appearing and in no acute distress. Eyes: Conjunctivae are normal. PERRL. EOMI. Head: Atraumatic. Nose: No congestion/rhinnorhea. Mouth/Throat: Mucous membranes are moist.   Neck: No stridor.   Cardiovascular: Normal rate, regular rhythm. Grossly normal heart sounds.   Respiratory: Tachypneic with labored respirations. Wheezing throughout with prolonged expiratory phase. Gastrointestinal: Soft and nontender. No distention.  Musculoskeletal: No lower extremity tenderness nor edema.  No joint effusions. Neurologic:  Normal speech and language. No gross focal neurologic deficits are appreciated.  Skin:  Skin is warm, dry and intact. No rash noted. Psychiatric: Mood and affect are normal. Speech and behavior are normal.  ____________________________________________   LABS (all labs ordered are listed, but only abnormal results are displayed)  Labs Reviewed  CBC WITH DIFFERENTIAL/PLATELET - Abnormal; Notable for the following:    Hemoglobin 12.9 (*)    HCT 38.6 (*)    RDW 15.9 (*)    Neutro Abs 7.1 (*)    Eosinophils Absolute 0.8 (*)    All other components within normal limits  BASIC METABOLIC PANEL - Abnormal; Notable for the following:    Glucose, Bld 120 (*)    BUN 26 (*)    Creatinine, Ser 1.27 (*)    Calcium 8.7 (*)    GFR calc non Af Amer 48 (*)    GFR calc Af Amer 56 (*)    All other components within normal limits  BRAIN NATRIURETIC PEPTIDE  TROPONIN I  PROTIME-INR   ____________________________________________  EKG  ED ECG REPORT I, Arelia Longest, the attending physician, personally viewed and interpreted this ECG.   Date: 09/24/2015  EKG Time: 2154  Rate: 91  Rhythm: Atrial fibrillation with intermittent pacing.  Intermittent PVCs.  Axis: Normal  Intervals:Incomplete right bundle-branch block.  ST&T Change: No ST segment elevation or depression. No abnormal T-wave inversions.  ____________________________________________  RADIOLOGY  DG Chest Portable 1 View (Final result) Result time: 09/24/15 23:35:17   Final result by Rad Results In Interface (09/24/15 23:35:17)   Narrative:   CLINICAL DATA: Acute onset of shortness of breath and wheezing. Initial encounter.  EXAM: PORTABLE CHEST 1 VIEW  COMPARISON: Chest radiograph performed 08/09/2015  FINDINGS: Vascular congestion is noted. Mildly increased interstitial markings could reflect minimal interstitial edema. No pleural effusion or pneumothorax is seen.  The cardiomediastinal silhouette is enlarged. The patient is status post median sternotomy. A pacemaker is noted overlying the left chest wall, with a single lead ending overlying the right ventricle. No acute osseous abnormalities are seen.  IMPRESSION: Vascular congestion and cardiomegaly. Mildly increased interstitial markings could reflect minimal interstitial edema.   Electronically Signed By: Roanna Raider M.D. On: 09/24/2015 23:35    ____________________________________________   PROCEDURES  CRITICAL CARE Performed by: Arelia Longest   Total critical care time: 35 minutes  Critical care time was exclusive of separately billable procedures and treating other patients.  Critical care was necessary to treat or prevent imminent or life-threatening deterioration.  Critical care was time spent personally by me on the following activities: development of treatment plan with patient and/or surrogate as well as nursing, discussions with consultants, evaluation of patient's response  to treatment, examination of patient, obtaining history from patient or surrogate, ordering and performing treatments and interventions, ordering and review of laboratory studies,  ordering and review of radiographic studies, pulse oximetry and re-evaluation of patient's condition.   ____________________________________________   INITIAL IMPRESSION / ASSESSMENT AND PLAN / ED COURSE  Pertinent labs & imaging results that were available during my care of the patient were reviewed by me and considered in my medical decision making (see chart for details).  ----------------------------------------- 11:40 PM on 09/24/2015 -----------------------------------------  Patient tolerating the BiPAP well. Still with labored respirations. We'll admit for COPD exacerbation. Signed out to Dr. Anne HahnWillis. When the diagnosis was a plan with patient as well as family and they're understanding and willing to comply. ____________________________________________   FINAL CLINICAL IMPRESSION(S) / ED DIAGNOSES  COPD exacerbation.    NEW MEDICATIONS STARTED DURING THIS VISIT:  New Prescriptions   No medications on file     Note:  This document was prepared using Dragon voice recognition software and may include unintentional dictation errors.    Myrna Blazeravid Matthew Schaevitz, MD 09/24/15 [phone]

## 2015-09-25 ENCOUNTER — Inpatient Hospital Stay: Payer: Medicare Other

## 2015-09-25 DIAGNOSIS — E039 Hypothyroidism, unspecified: Secondary | ICD-10-CM | POA: Diagnosis present

## 2015-09-25 DIAGNOSIS — Z8673 Personal history of transient ischemic attack (TIA), and cerebral infarction without residual deficits: Secondary | ICD-10-CM | POA: Diagnosis not present

## 2015-09-25 DIAGNOSIS — I10 Essential (primary) hypertension: Secondary | ICD-10-CM | POA: Diagnosis present

## 2015-09-25 DIAGNOSIS — Z7901 Long term (current) use of anticoagulants: Secondary | ICD-10-CM | POA: Diagnosis not present

## 2015-09-25 DIAGNOSIS — I130 Hypertensive heart and chronic kidney disease with heart failure and stage 1 through stage 4 chronic kidney disease, or unspecified chronic kidney disease: Secondary | ICD-10-CM | POA: Diagnosis present

## 2015-09-25 DIAGNOSIS — Z9981 Dependence on supplemental oxygen: Secondary | ICD-10-CM | POA: Diagnosis not present

## 2015-09-25 DIAGNOSIS — J9621 Acute and chronic respiratory failure with hypoxia: Secondary | ICD-10-CM | POA: Diagnosis present

## 2015-09-25 DIAGNOSIS — I5043 Acute on chronic combined systolic (congestive) and diastolic (congestive) heart failure: Secondary | ICD-10-CM | POA: Diagnosis present

## 2015-09-25 DIAGNOSIS — G20 Parkinson's disease: Secondary | ICD-10-CM | POA: Diagnosis present

## 2015-09-25 DIAGNOSIS — I482 Chronic atrial fibrillation: Secondary | ICD-10-CM | POA: Diagnosis present

## 2015-09-25 DIAGNOSIS — E1165 Type 2 diabetes mellitus with hyperglycemia: Secondary | ICD-10-CM | POA: Diagnosis present

## 2015-09-25 DIAGNOSIS — Z66 Do not resuscitate: Secondary | ICD-10-CM | POA: Diagnosis present

## 2015-09-25 DIAGNOSIS — E1122 Type 2 diabetes mellitus with diabetic chronic kidney disease: Secondary | ICD-10-CM | POA: Diagnosis present

## 2015-09-25 DIAGNOSIS — N183 Chronic kidney disease, stage 3 (moderate): Secondary | ICD-10-CM | POA: Diagnosis present

## 2015-09-25 DIAGNOSIS — Z79899 Other long term (current) drug therapy: Secondary | ICD-10-CM | POA: Diagnosis not present

## 2015-09-25 DIAGNOSIS — J441 Chronic obstructive pulmonary disease with (acute) exacerbation: Secondary | ICD-10-CM | POA: Diagnosis present

## 2015-09-25 DIAGNOSIS — R0600 Dyspnea, unspecified: Secondary | ICD-10-CM | POA: Diagnosis not present

## 2015-09-25 DIAGNOSIS — Z794 Long term (current) use of insulin: Secondary | ICD-10-CM | POA: Diagnosis not present

## 2015-09-25 DIAGNOSIS — Z87891 Personal history of nicotine dependence: Secondary | ICD-10-CM | POA: Diagnosis not present

## 2015-09-25 LAB — CBC
HCT: 38.6 % — ABNORMAL LOW (ref 40.0–52.0)
Hemoglobin: 12.7 g/dL — ABNORMAL LOW (ref 13.0–18.0)
MCH: 29.1 pg (ref 26.0–34.0)
MCHC: 33 g/dL (ref 32.0–36.0)
MCV: 88.2 fL (ref 80.0–100.0)
PLATELETS: 150 10*3/uL (ref 150–440)
RBC: 4.38 MIL/uL — ABNORMAL LOW (ref 4.40–5.90)
RDW: 16.2 % — AB (ref 11.5–14.5)
WBC: 10.2 10*3/uL (ref 3.8–10.6)

## 2015-09-25 LAB — URINALYSIS COMPLETE WITH MICROSCOPIC (ARMC ONLY)
Bacteria, UA: NONE SEEN
Bilirubin Urine: NEGATIVE
Hgb urine dipstick: NEGATIVE
Leukocytes, UA: NEGATIVE
Nitrite: NEGATIVE
PROTEIN: NEGATIVE mg/dL
Specific Gravity, Urine: 1.018 (ref 1.005–1.030)
pH: 5 (ref 5.0–8.0)

## 2015-09-25 LAB — BASIC METABOLIC PANEL
Anion gap: 9 (ref 5–15)
BUN: 26 mg/dL — AB (ref 6–20)
CALCIUM: 8.5 mg/dL — AB (ref 8.9–10.3)
CO2: 29 mmol/L (ref 22–32)
CREATININE: 1.26 mg/dL — AB (ref 0.61–1.24)
Chloride: 100 mmol/L — ABNORMAL LOW (ref 101–111)
GFR calc Af Amer: 56 mL/min — ABNORMAL LOW (ref >60)
GFR, EST NON AFRICAN AMERICAN: 48 mL/min — AB (ref >60)
GLUCOSE: 326 mg/dL — AB (ref 65–99)
Potassium: 4.2 mmol/L (ref 3.5–5.1)
Sodium: 138 mmol/L (ref 135–145)

## 2015-09-25 LAB — GLUCOSE, CAPILLARY
GLUCOSE-CAPILLARY: 123 mg/dL — AB (ref 65–99)
GLUCOSE-CAPILLARY: 350 mg/dL — AB (ref 65–99)
GLUCOSE-CAPILLARY: 375 mg/dL — AB (ref 65–99)
GLUCOSE-CAPILLARY: 400 mg/dL — AB (ref 65–99)
GLUCOSE-CAPILLARY: 442 mg/dL — AB (ref 65–99)

## 2015-09-25 LAB — PROTIME-INR
INR: 2.61
Prothrombin Time: 27.6 seconds — ABNORMAL HIGH (ref 11.4–15.0)

## 2015-09-25 LAB — MRSA PCR SCREENING: MRSA BY PCR: NEGATIVE

## 2015-09-25 MED ORDER — INSULIN GLARGINE 100 UNIT/ML ~~LOC~~ SOLN
10.0000 [IU] | Freq: Every day | SUBCUTANEOUS | Status: DC
  Administered 2015-09-25: 10 [IU] via SUBCUTANEOUS
  Filled 2015-09-25 (×2): qty 0.1

## 2015-09-25 MED ORDER — WARFARIN SODIUM 5 MG PO TABS
6.0000 mg | ORAL_TABLET | ORAL | Status: DC
  Administered 2015-09-26: 6 mg via ORAL
  Filled 2015-09-25: qty 1

## 2015-09-25 MED ORDER — INSULIN GLARGINE 100 UNIT/ML ~~LOC~~ SOLN
25.0000 [IU] | Freq: Every day | SUBCUTANEOUS | Status: DC
  Filled 2015-09-25: qty 0.25

## 2015-09-25 MED ORDER — NAPHAZOLINE-GLYCERIN 0.012-0.2 % OP SOLN
1.0000 [drp] | Freq: Four times a day (QID) | OPHTHALMIC | Status: DC | PRN
  Filled 2015-09-25 (×2): qty 15

## 2015-09-25 MED ORDER — CETYLPYRIDINIUM CHLORIDE 0.05 % MT LIQD
7.0000 mL | Freq: Two times a day (BID) | OROMUCOSAL | Status: DC
  Administered 2015-09-26 – 2015-09-28 (×6): 7 mL via OROMUCOSAL

## 2015-09-25 MED ORDER — DEXTROSE 5 % IV SOLN
500.0000 mg | INTRAVENOUS | Status: DC
  Administered 2015-09-25: 500 mg via INTRAVENOUS
  Filled 2015-09-25 (×2): qty 500

## 2015-09-25 MED ORDER — FUROSEMIDE 40 MG PO TABS
40.0000 mg | ORAL_TABLET | ORAL | Status: DC
  Administered 2015-09-25 – 2015-09-28 (×4): 40 mg via ORAL
  Filled 2015-09-25 (×5): qty 1

## 2015-09-25 MED ORDER — METOPROLOL SUCCINATE ER 25 MG PO TB24
25.0000 mg | ORAL_TABLET | Freq: Every evening | ORAL | Status: DC
  Administered 2015-09-25 – 2015-09-27 (×3): 25 mg via ORAL
  Filled 2015-09-25 (×3): qty 1

## 2015-09-25 MED ORDER — INSULIN ASPART 100 UNIT/ML ~~LOC~~ SOLN
0.0000 [IU] | Freq: Three times a day (TID) | SUBCUTANEOUS | Status: DC
  Administered 2015-09-25: 7 [IU] via SUBCUTANEOUS
  Administered 2015-09-25 (×2): 9 [IU] via SUBCUTANEOUS
  Filled 2015-09-25: qty 7
  Filled 2015-09-25 (×2): qty 9

## 2015-09-25 MED ORDER — LISINOPRIL 20 MG PO TABS
40.0000 mg | ORAL_TABLET | Freq: Every day | ORAL | Status: DC
  Administered 2015-09-25 – 2015-09-27 (×3): 40 mg via ORAL
  Filled 2015-09-25 (×3): qty 2

## 2015-09-25 MED ORDER — ONDANSETRON HCL 4 MG/2ML IJ SOLN: 4.0000 mg | Freq: Four times a day (QID) | INTRAMUSCULAR | Status: DC | PRN

## 2015-09-25 MED ORDER — METHYLPREDNISOLONE SODIUM SUCC 125 MG IJ SOLR
60.0000 mg | Freq: Four times a day (QID) | INTRAMUSCULAR | Status: DC
  Administered 2015-09-25 – 2015-09-26 (×6): 60 mg via INTRAVENOUS
  Filled 2015-09-25 (×6): qty 2

## 2015-09-25 MED ORDER — WARFARIN - PHARMACIST DOSING INPATIENT
Freq: Every day | Status: DC
  Administered 2015-09-25: 18:00:00
  Filled 2015-09-25: qty 1

## 2015-09-25 MED ORDER — FUROSEMIDE 20 MG PO TABS
20.0000 mg | ORAL_TABLET | Freq: Every evening | ORAL | Status: DC
  Administered 2015-09-25 – 2015-09-27 (×3): 20 mg via ORAL
  Filled 2015-09-25 (×2): qty 1

## 2015-09-25 MED ORDER — INSULIN ASPART 100 UNIT/ML ~~LOC~~ SOLN
0.0000 [IU] | Freq: Every day | SUBCUTANEOUS | Status: DC
  Filled 2015-09-25: qty 5

## 2015-09-25 MED ORDER — SODIUM CHLORIDE 0.9% FLUSH
3.0000 mL | Freq: Two times a day (BID) | INTRAVENOUS | Status: DC
  Administered 2015-09-25 – 2015-09-28 (×6): 3 mL via INTRAVENOUS

## 2015-09-25 MED ORDER — INSULIN ASPART 100 UNIT/ML ~~LOC~~ SOLN
5.0000 [IU] | Freq: Three times a day (TID) | SUBCUTANEOUS | Status: DC
  Administered 2015-09-25 – 2015-09-28 (×9): 5 [IU] via SUBCUTANEOUS
  Filled 2015-09-25 (×8): qty 5

## 2015-09-25 MED ORDER — ONDANSETRON HCL 4 MG PO TABS: 4.0000 mg | ORAL_TABLET | Freq: Four times a day (QID) | ORAL | Status: DC | PRN

## 2015-09-25 MED ORDER — CARBIDOPA-LEVODOPA 25-250 MG PO TABS
1.0000 | ORAL_TABLET | Freq: Three times a day (TID) | ORAL | Status: DC
  Administered 2015-09-25 – 2015-09-28 (×11): 1 via ORAL
  Filled 2015-09-25 (×13): qty 1

## 2015-09-25 MED ORDER — IPRATROPIUM-ALBUTEROL 0.5-2.5 (3) MG/3ML IN SOLN
3.0000 mL | Freq: Four times a day (QID) | RESPIRATORY_TRACT | Status: DC
  Administered 2015-09-25 – 2015-09-28 (×11): 3 mL via RESPIRATORY_TRACT
  Filled 2015-09-25 (×11): qty 3

## 2015-09-25 MED ORDER — CHLORHEXIDINE GLUCONATE 0.12 % MT SOLN
15.0000 mL | Freq: Two times a day (BID) | OROMUCOSAL | Status: DC
  Administered 2015-09-25 – 2015-09-28 (×6): 15 mL via OROMUCOSAL
  Filled 2015-09-25 (×5): qty 15

## 2015-09-25 MED ORDER — ACETAMINOPHEN 325 MG PO TABS: 650.0000 mg | ORAL_TABLET | Freq: Four times a day (QID) | ORAL | Status: DC | PRN

## 2015-09-25 MED ORDER — IPRATROPIUM-ALBUTEROL 0.5-2.5 (3) MG/3ML IN SOLN: 3.0000 mL | RESPIRATORY_TRACT | Status: DC | PRN

## 2015-09-25 MED ORDER — INSULIN ASPART 100 UNIT/ML ~~LOC~~ SOLN
0.0000 [IU] | Freq: Three times a day (TID) | SUBCUTANEOUS | Status: DC
  Administered 2015-09-25 – 2015-09-26 (×4): 15 [IU] via SUBCUTANEOUS
  Administered 2015-09-27: 11 [IU] via SUBCUTANEOUS
  Administered 2015-09-27: 2 [IU] via SUBCUTANEOUS
  Administered 2015-09-27 (×2): 8 [IU] via SUBCUTANEOUS
  Administered 2015-09-28: 11 [IU] via SUBCUTANEOUS
  Administered 2015-09-28: 2 [IU] via SUBCUTANEOUS
  Filled 2015-09-25 (×2): qty 15
  Filled 2015-09-25: qty 11
  Filled 2015-09-25 (×3): qty 15
  Filled 2015-09-25 (×2): qty 8
  Filled 2015-09-25: qty 2
  Filled 2015-09-25: qty 11
  Filled 2015-09-25: qty 2

## 2015-09-25 MED ORDER — ACETAMINOPHEN 650 MG RE SUPP: 650.0000 mg | Freq: Four times a day (QID) | RECTAL | Status: DC | PRN

## 2015-09-25 MED ORDER — WARFARIN SODIUM 2 MG PO TABS
3.0000 mg | ORAL_TABLET | ORAL | Status: DC
  Administered 2015-09-25: 3 mg via ORAL
  Filled 2015-09-25: qty 3

## 2015-09-25 NOTE — Progress Notes (Signed)
Per Dr Jay SchlichterKalessetti patient can go diwn to xray without nursing

## 2015-09-25 NOTE — H&P (Signed)
[facility] [place] at [facility]   PATIENT NAME: Troy [name]    MR#:  [id number]  DATE OF BIRTH:  12-31-1923  DATE OF ADMISSION:  09/24/2015  PRIMARY CARE PHYSICIAN: Barbette ReichmannHANDE,VISHWANATH, MD   REQUESTING/REFERRING PHYSICIAN: Pershing ProudSchaevitz, MD  CHIEF COMPLAINT:   Chief Complaint  Patient presents with  . Shortness of Breath    HISTORY OF PRESENT ILLNESS:  Troy CamelRobert Neubert  is a 82 y.o. male who presents with Progressive shortness of breath and wheezing over the last several days. He reached a point today where he felt like he could not move his air very well at all, he came to the ED for evaluation. Evaluation here tends to favor COPD exacerbation. Patient has a history of heart failure, but his BNP was not elevated. He required BiPAP due to hypoxia, received standard COPD treatment in the ED, and hospitalists were called for admission.  PAST MEDICAL HISTORY:   Past Medical History  Diagnosis Date  . Diabetes mellitus without complication (HCC)   . Hypertension   . UTI (lower urinary tract infection)   . Parkinson's disease (HCC)   . TIA (transient ischemic attack)   . Chronic atrial fibrillation (HCC)     a. on Couamdin; b. CHADS2VASc at least 7 (CHF, HTN, age x 2, DM, stroke x 2); c. s/p MAZE 1999  . Chronic systolic CHF (congestive heart failure) (HCC)     a. echo 08/01/2015: EF of 45%, mild LVH, mitral valve with annular calcification, mitral valve partially mobile  . Stroke (HCC)   . Hypothyroidism   . Normal coronary arteries     a. by cardiac cath 1999  . Mitral valve prolapse     a. s/p mitral valve repair 1999  . CKD (chronic kidney disease), stage III   . COPD (chronic obstructive pulmonary disease) (HCC)     PAST SURGICAL HISTORY:   Past Surgical History  Procedure Laterality Date  . Cardiac surgery    . Cholecystectomy    . Cardiac catheterization N/A 08/08/2015    Procedure: Temporary Pacemaker;  Surgeon: Peter M SwazilandJordan, MD;  [facility];  Service: Cardiovascular;  Laterality: N/A;  . Ep implantable device N/A 08/08/2015    Procedure: Pacemaker Implant;  Surgeon: Will Jorja LoaMartin Camnitz, MD;  Location: [facility] INVASIVE CV LAB;  Service: Cardiovascular;  Laterality: N/A;    SOCIAL HISTORY:   Social History  Substance Use Topics  . Smoking status: Former Smoker -- 4.00 packs/day for 20 years    Types: Cigarettes  . Smokeless tobacco: Never Used  . Alcohol Use: No    FAMILY HISTORY:   Family History  Problem Relation Age of Onset  . Hypertension Mother   . Stroke Mother   . CAD Father   . Lymphoma Sister   . Lung disease Brother     DRUG ALLERGIES:  No Known Allergies  MEDICATIONS AT HOME:   Prior to Admission medications   Medication Sig Start Date End Date Taking? Authorizing Provider  albuterol (PROVENTIL HFA) 108 (90 Base) MCG/ACT inhaler Inhale 2 puffs into the lungs every 4 (four) hours as needed for wheezing or shortness of breath. 07/11/15  Yes Sharman CheekPhillip Stafford, MD  albuterol (PROVENTIL) (2.5 MG/3ML) 0.083% nebulizer solution Take 2.5 mg by nebulization every 6 (six) hours as needed for wheezing or shortness of breath.   Yes Historical Provider, MD  carbidopa-levodopa (SINEMET IR) 25-250 MG tablet Take 1 tablet by mouth 3 (three) times daily.  Yes Historical Provider, MD  cetirizine (ZYRTEC) 10 MG tablet Take 10 mg by mouth daily.   Yes Historical Provider, MD  furosemide (LASIX) 20 MG tablet Take 20 mg by mouth every evening.   Yes Historical Provider, MD  furosemide (LASIX) 40 MG tablet Take 40 mg by mouth every morning.    Yes Historical Provider, MD  glipiZIDE (GLUCOTROL) 10 MG tablet Take 5-10 mg by mouth 2 (two) times daily. Pt takes one tablet in the morning and one-half tablet in the evening.   Yes Historical Provider, MD  insulin regular human CONCENTRATED (HUMULIN R) 500 UNIT/ML injection Inject 4-6 Units into the skin 2 (two) times daily with a meal. Pt uses four units with breakfast and  six units with dinner.   Yes Historical Provider, MD  lisinopril (PRINIVIL,ZESTRIL) 40 MG tablet Take 40 mg by mouth at bedtime.    Yes Historical Provider, MD  metoprolol succinate (TOPROL-XL) 25 MG 24 hr tablet Take 1 tablet (25 mg total) by mouth every evening. Take with or immediately following a meal. 08/06/15  Yes Vipul Sherryll Burger, MD  Multiple Vitamins-Minerals (PRESERVISION AREDS 2) CAPS Take 1 capsule by mouth 2 (two) times daily.   Yes Historical Provider, MD  saxagliptin HCl (ONGLYZA) 2.5 MG TABS tablet Take 5 mg by mouth every evening.   Yes Historical Provider, MD  Tetrahydroz-Dextran-PEG-Povid (EYE DROPS ADVANCED RELIEF) 0.05-0.1-1-1 % SOLN Place 1 drop into both eyes daily.   Yes Historical Provider, MD  warfarin (COUMADIN) 3 MG tablet Take 3 mg by mouth every evening. Pt takes on Tuesday, Thursday, and Saturday.   Yes Historical Provider, MD  warfarin (COUMADIN) 6 MG tablet Take 6 mg by mouth every evening. Pt takes on Monday, Wednesday, Friday, and Sunday.   Yes Historical Provider, MD    REVIEW OF SYSTEMS:  Review of Systems  Constitutional: Negative for fever, chills, weight loss and malaise/fatigue.  HENT: Negative for ear pain, hearing loss and tinnitus.   Eyes: Negative for blurred vision, double vision, pain and redness.  Respiratory: Positive for cough, sputum production, shortness of breath and wheezing. Negative for hemoptysis.   Cardiovascular: Negative for chest pain, palpitations, orthopnea and leg swelling.  Gastrointestinal: Negative for nausea, vomiting, abdominal pain, diarrhea and constipation.  Genitourinary: Negative for dysuria, frequency and hematuria.  Musculoskeletal: Negative for back pain, joint pain and neck pain.  Skin:       No acne, rash, or lesions  Neurological: Negative for dizziness, tremors, focal weakness and weakness.  Endo/Heme/Allergies: Negative for polydipsia. Does not bruise/bleed easily.  Psychiatric/Behavioral: Negative for depression. The  patient is not nervous/anxious and does not have insomnia.      VITAL SIGNS:   Filed Vitals:   09/24/15 2300 09/24/15 2304 09/24/15 2315 09/24/15 2327  BP: 130/71  118/68   Pulse: 85 58 70   Temp:      TempSrc:      Resp: 19 18 22    Height:      Weight:      SpO2: 100% 99% 99% 100%   Wt Readings from Last 3 Encounters:  09/24/15 104.781 kg (231 lb)  09/12/15 104.327 kg (230 lb)  08/20/15 102.059 kg (225 lb)    PHYSICAL EXAMINATION:  Physical Exam  Vitals reviewed. Constitutional: He is oriented to person, place, and time. He appears well-developed and well-nourished. No distress.  HENT:  Head: Normocephalic and atraumatic.  Mouth/Throat: Oropharynx is clear and moist.  Eyes: Conjunctivae and EOM are normal. Pupils are equal, round,  and reactive to light. No scleral icterus.  Neck: Normal range of motion. Neck supple. No JVD present. No thyromegaly present.  Cardiovascular: Normal rate, regular rhythm and intact distal pulses.  Exam reveals no gallop and no friction rub.   No murmur heard. Respiratory: He is in respiratory distress (on bipap). He has wheezes. He has no rales.  GI: Soft. Bowel sounds are normal. He exhibits no distension. There is no tenderness.  Musculoskeletal: Normal range of motion. He exhibits no edema.  No arthritis, no gout  Lymphadenopathy:    He has no cervical adenopathy.  Neurological: He is alert and oriented to person, place, and time. No cranial nerve deficit.  No dysarthria, no aphasia  Skin: Skin is warm and dry. No rash noted. No erythema.  Psychiatric: He has a normal mood and affect. His behavior is normal. Judgment and thought content normal.    LABORATORY PANEL:   CBC  Recent Labs Lab 09/24/15 2230  WBC 10.4  HGB 12.9*  HCT 38.6*  PLT 164   ------------------------------------------------------------------------------------------------------------------  Chemistries   Recent Labs Lab 09/24/15 2230  NA 140  K 4.0  CL  102  CO2 32  GLUCOSE 120*  BUN 26*  CREATININE 1.27*  CALCIUM 8.7*   ------------------------------------------------------------------------------------------------------------------  Cardiac Enzymes  Recent Labs Lab 09/24/15 2230  TROPONINI 0.03   ------------------------------------------------------------------------------------------------------------------  RADIOLOGY:  Dg Chest Portable 1 View  09/24/2015  CLINICAL DATA:  Acute onset of shortness of breath and wheezing. Initial encounter. EXAM: PORTABLE CHEST 1 VIEW COMPARISON:  Chest radiograph performed 08/09/2015 FINDINGS: Vascular congestion is noted. Mildly increased interstitial markings could reflect minimal interstitial edema. No pleural effusion or pneumothorax is seen. The cardiomediastinal silhouette is enlarged. The patient is status post median sternotomy. A pacemaker is noted overlying the left chest wall, with a single lead ending overlying the right ventricle. No acute osseous abnormalities are seen. IMPRESSION: Vascular congestion and cardiomegaly. Mildly increased interstitial markings could reflect minimal interstitial edema. Electronically Signed   By: Roanna Raider M.D.   On: 09/24/2015 23:35    EKG:   Orders placed or performed during the hospital encounter of 09/24/15  . EKG 12-Lead  . EKG 12-Lead  . ED EKG  . ED EKG    IMPRESSION AND PLAN:  Principal Problem:   COPD exacerbation (HCC) - IV Solu-Medrol, duo nebs, BiPAP for now, admit to stepdown unit at home continue home inhalers. Active Problems:   CHF (congestive heart failure) (HCC) - continue home meds, does not seem to be in exacerbation at this time.   Chronic atrial fibrillation (HCC) - continue home rate controlling medications as well as anticoagulation.   Diabetes (HCC) - sliding scale insulin with corresponding glucose checks and carb modified diet   HTN (hypertension) - continue home meds   Parkinson's disease (HCC) - continue home  dose Sinemet   Hypothyroidism - continue home dose thyroid replacement  All the records are reviewed and case discussed with ED provider. Management plans discussed with the patient and/or family.  DVT PROPHYLAXIS: Systemic anticoagulation  GI PROPHYLAXIS: None  ADMISSION STATUS: Inpatient  CODE STATUS: DNR Code Status History    Date Active Date Inactive Code Status Order ID Comments User Context   08/07/2015  7:50 PM 08/09/2015  3:23 PM DNR [id number]  Darrol Jump, PA-C Inpatient   08/03/2015  4:58 PM 08/07/2015  7:50 PM DNR [id number]  Houston Siren, MD ED   02/25/2015  7:56 PM 03/01/2015  7:21 PM Full Code  [id number]  Gale Journey, MD ED    Questions for Most Recent Historical Code Status (Order [id number])    Question Answer Comment   In the event of cardiac or respiratory ARREST Do not call a "code blue"    In the event of cardiac or respiratory ARREST Do not perform Intubation, CPR, defibrillation or ACLS    In the event of cardiac or respiratory ARREST Use medication by any route, position, wound care, and other measures to relive pain and suffering. May use oxygen, suction and manual treatment of airway obstruction as needed for comfort.       TOTAL TIME TAKING CARE OF THIS PATIENT: 45 minutes.    Tyrek Lawhorn FIELDING 09/25/2015, 12:12 AM  [facility]  Office  [phone]  CC: Primary care physician; Barbette Reichmann, MD

## 2015-09-25 NOTE — Progress Notes (Signed)
Inpatient Diabetes Program Recommendations  AACE/ADA: New Consensus Statement on Inpatient Glycemic Control (2015)  Target Ranges:  Prepandial:   less than 140 mg/dL      Peak postprandial:   less than 180 mg/dL (1-2 hours)      Critically ill patients:  140 - 180 mg/dL   Review of Glycemic ControlResults for Troy [name], [name] (MRN [id number]) as of 09/25/2015 11:17  Ref. Range 09/25/2015 01:36 09/25/2015 08:13  Glucose-Capillary Latest Ref Range: 65-99 mg/dL 536123 (H) 144350 (H)   Diabetes history: Type 2 diabetes Outpatient Diabetes medications:  U500 insulin 4 units AM/ 6 units PM (this totals 50 units of u100 insulin), Onglyza 5 mg q PM, Glipizide 5-10 mg 2 times daily Current orders for Inpatient glycemic control:  Novolog sensitive tid with meals and HS,  Solumedrol 60 mg IV q 6 hours  Inpatient Diabetes Program Recommendations:    Please consider adding Lantus 25 units daily (this is 1/2 of home u500 dose) and Novolog 5 units tid with meals (Hold if patient eats less than 50%).   Thanks, Beryl MeagerJenny Grizelda Piscopo, RN, BC-ADM Inpatient Diabetes Coordinator Pager [phone] (8a-5p)

## 2015-09-25 NOTE — Progress Notes (Addendum)
[facility] - [place] at [facility]   PATIENT NAME: Troy [name]    MR#:  [id number]  DATE OF BIRTH:  May 11, 1923  SUBJECTIVE:  CHIEF COMPLAINT:   Chief Complaint  Patient presents with  . Shortness of Breath   - Admitted for acute respiratory failure secondary to COPD exacerbation. - Was on BiPAP on admission. Currently down to 3 L nasal cannula - still has scattered wheezing on exam, mental status still sleepy  REVIEW OF SYSTEMS:  Review of Systems  Constitutional: Positive for malaise/fatigue. Negative for fever and chills.  HENT: Negative for ear discharge, ear pain and nosebleeds.   Eyes: Negative for blurred vision.  Respiratory: Positive for shortness of breath and wheezing. Negative for cough.   Cardiovascular: Negative for chest pain, palpitations and leg swelling.  Gastrointestinal: Negative for nausea, vomiting, abdominal pain, diarrhea and constipation.  Genitourinary: Negative for dysuria.  Musculoskeletal: Negative for myalgias and back pain.  Neurological: Negative for dizziness, sensory change, speech change, focal weakness, seizures and headaches.  Psychiatric/Behavioral: Negative for depression.    DRUG ALLERGIES:  No Known Allergies  VITALS:  Blood pressure 149/93, pulse 79, temperature 98 F (36.7 C), temperature source Axillary, resp. rate 17, height  (1.753 m), weight 106.2 kg (234 lb 2.1 oz), SpO2 99 %.  PHYSICAL EXAMINATION:  Physical Exam  GENERAL:  82 y.o.-year-old patient lying in the bed, dyspneic, on 3l o2, sleepy- but arousable.  EYES: Pupils equal, round, reactive to light and accommodation. No scleral icterus. Extraocular muscles intact.  HEENT: Head atraumatic, normocephalic. Oropharynx and nasopharynx clear.  NECK:  Supple, no jugular venous distention. No thyroid enlargement, no tenderness.  LUNGS: tight on auscultation, scattered expiratory wheezing, no rales,rhonchi or crepitation. No use of accessory  muscles of respiration.  CARDIOVASCULAR: S1, S2 normal. No rubs, or gallops. 3/6 systolic murmur ABDOMEN: Soft, nontender, nondistended. Bowel sounds present. No organomegaly or mass.  EXTREMITIES: No pedal edema, cyanosis, or clubbing.  NEUROLOGIC: Cranial nerves II through XII are intact. Muscle strength 5/5 in all extremities. Sensation intact. Gait not checked.  PSYCHIATRIC: The patient is sleepy, arousable, then following commands and answering simple questions  SKIN: No obvious rash, lesion, or ulcer.    LABORATORY PANEL:   CBC  Recent Labs Lab 09/25/15 0616  WBC 10.2  HGB 12.7*  HCT 38.6*  PLT 150   ------------------------------------------------------------------------------------------------------------------  Chemistries   Recent Labs Lab 09/25/15 0616  NA 138  K 4.2  CL 100*  CO2 29  GLUCOSE 326*  BUN 26*  CREATININE 1.26*  CALCIUM 8.5*   ------------------------------------------------------------------------------------------------------------------  Cardiac Enzymes  Recent Labs Lab 09/24/15 2230  TROPONINI 0.03   ------------------------------------------------------------------------------------------------------------------  RADIOLOGY:  Dg Chest Portable 1 View  09/24/2015  CLINICAL DATA:  Acute onset of shortness of breath and wheezing. Initial encounter. EXAM: PORTABLE CHEST 1 VIEW COMPARISON:  Chest radiograph performed 08/09/2015 FINDINGS: Vascular congestion is noted. Mildly increased interstitial markings could reflect minimal interstitial edema. No pleural effusion or pneumothorax is seen. The cardiomediastinal silhouette is enlarged. The patient is status post median sternotomy. A pacemaker is noted overlying the left chest wall, with a single lead ending overlying the right ventricle. No acute osseous abnormalities are seen. IMPRESSION: Vascular congestion and cardiomegaly. Mildly increased interstitial markings could reflect minimal  interstitial edema. Electronically Signed   By: Roanna Raider M.D.   On: 09/24/2015 23:35    EKG:   Orders placed or performed during the hospital encounter of 09/24/15  . EKG 12-Lead  .  EKG 12-Lead  . ED EKG  . ED EKG    ASSESSMENT AND PLAN:   82 year old male with past medical history significant for hypertension, insulin-dependent diabetes mellitus, Parkinson's disease, chronic A. fib on Coumadin, chronic systolic CHF, COPD on home oxygen and CK D presents to the hospital secondary to worsening shortness of breath  #1 acute on chronic hypoxic respiratory failure-secondary to COPD exacerbation. -Required BiPAP on admission. Continue to monitor and stepdown unit. -Currently been to 3 L oxygen. Mental status borderline. If does not improve in the next couple of hours, we'll do an ABG -Continue steroids, nebulizer treatments and inhalers -Chest x-ray without any infiltrate, patient does have some cough. Continue azithromycin  #2 chronic systolic CHF, EF of 45%-chest x-ray with, chronic pulmonary congestion. -Continue Lasix at home dose. -Also on lisinopril and metoprolol  #3 diabetes mellitus with hyperglycemia-patient on U500 insulin at home. -Appreciate diabetes coordinator input. Started on Lantus at bedtime and also NovoLog pre-meals. -Continue sliding scale insulin  #4 chronic atrial fibrillation-rate controlled. On metoprolol -On warfarin and INR is therapeutic.  #5 DVT prophylaxis-on warfarin   Physical therapy consult once respiratory status is stable    All the records are reviewed and case discussed with Care Management/Social Workerr. Management plans discussed with the patient, family and they are in agreement.  CODE STATUS:DNR  TOTAL TIME TAKING CARE OF THIS PATIENT: 37 minutes.   POSSIBLE D/C IN 2 DAYS, DEPENDING ON CLINICAL CONDITION.   Enid BaasKALISETTI,Reyana Leisey M.D on 09/25/2015 at 2:30 PM  Between 7am to 6pm - Pager - [phone]  After 6pm go to  [email or web address] - password [email or web address] [facility]  [facility]  Office  [phone]  CC: Primary care physician; Barbette ReichmannHANDE,VISHWANATH, MD

## 2015-09-25 NOTE — Progress Notes (Signed)
ANTICOAGULATION CONSULT NOTE - Initial Consult  Pharmacy Consult for VKA Indication: atrial fibrillation  No Known Allergies  Patient Measurements: Height: 5\' 9"  (175.3 cm) Weight: 234 lb 2.1 oz (106.2 kg) IBW/kg (Calculated) : 70.7 Heparin Dosing Weight:   Vital Signs: Temp: 97.5 F (36.4 C) (05/23 0128) Temp Source: Axillary (05/23 0128) BP: 108/70 mmHg (05/23 0128) Pulse Rate: 95 (05/23 0128)  Labs:  Recent Labs  09/24/15 2230  HGB 12.9*  HCT 38.6*  PLT 164  LABPROT 27.6*  INR 2.61  CREATININE 1.27*  TROPONINI 0.03    Estimated Creatinine Clearance: 46.4 mL/min (by C-G formula based on Cr of 1.27).   Medical History: Past Medical History  Diagnosis Date  . Diabetes mellitus without complication (HCC)   . Hypertension   . UTI (lower urinary tract infection)   . Parkinson's disease (HCC)   . TIA (transient ischemic attack)   . Chronic atrial fibrillation (HCC)     a. on Couamdin; b. CHADS2VASc at least 7 (CHF, HTN, age x 2, DM, stroke x 2); c. s/p MAZE 1999  . Chronic systolic CHF (congestive heart failure) (HCC)     a. echo 08/01/2015: EF of 45%, mild LVH, mitral valve with annular calcification, mitral valve partially mobile  . Stroke (HCC)   . Hypothyroidism   . Normal coronary arteries     a. by cardiac cath 1999  . Mitral valve prolapse     a. s/p mitral valve repair 1999  . CKD (chronic kidney disease), stage III   . COPD (chronic obstructive pulmonary disease) (HCC)     Medications:  Infusions:    Assessment: 89 yom cc AECOPD. Pharmacy consulted to continue home VKA for AF. INR this evening therapeutic at 2.6 and patient reports taking a dose before he came to ED.  Goal of Therapy:  INR 2-3 Monitor platelets by anticoagulation protocol: Yes   Plan:  Resume home dose - warfarin 6 mg po daily on Monday, Wednesday, Friday, and Sunday, and warfarin 3 mg po daily on Tuesday, Thursday, and Saturday.   Carola FrostNathan A Mireille Lacombe, Pharm.D., BCPS Clinical  Pharmacist 09/25/2015,1:32 AM

## 2015-09-26 LAB — BASIC METABOLIC PANEL
ANION GAP: 12 (ref 5–15)
BUN: 39 mg/dL — ABNORMAL HIGH (ref 6–20)
CALCIUM: 9 mg/dL (ref 8.9–10.3)
CHLORIDE: 98 mmol/L — AB (ref 101–111)
CO2: 24 mmol/L (ref 22–32)
CREATININE: 1.28 mg/dL — AB (ref 0.61–1.24)
GFR calc non Af Amer: 48 mL/min — ABNORMAL LOW (ref >60)
GFR, EST AFRICAN AMERICAN: 55 mL/min — AB (ref >60)
Glucose, Bld: 403 mg/dL — ABNORMAL HIGH (ref 65–99)
Potassium: 4.4 mmol/L (ref 3.5–5.1)
SODIUM: 134 mmol/L — AB (ref 135–145)

## 2015-09-26 LAB — GLUCOSE, CAPILLARY
GLUCOSE-CAPILLARY: 378 mg/dL — AB (ref 65–99)
GLUCOSE-CAPILLARY: 359 mg/dL — AB (ref 65–99)
GLUCOSE-CAPILLARY: 497 mg/dL — AB (ref 65–99)
GLUCOSE-CAPILLARY: 481 mg/dL — AB (ref 65–99)
Glucose-Capillary: 498 mg/dL — ABNORMAL HIGH (ref 65–99)
Glucose-Capillary: 500 mg/dL — ABNORMAL HIGH (ref 65–99)
Glucose-Capillary: 449 mg/dL — ABNORMAL HIGH (ref 65–99)
Glucose-Capillary: 467 mg/dL — ABNORMAL HIGH (ref 65–99)
Glucose-Capillary: 384 mg/dL — ABNORMAL HIGH (ref 65–99)

## 2015-09-26 LAB — PROTIME-INR
INR: 2.86
PROTHROMBIN TIME: 29.5 s — AB (ref 11.4–15.0)

## 2015-09-26 MED ORDER — INSULIN DETEMIR 100 UNIT/ML ~~LOC~~ SOLN
10.0000 [IU] | Freq: Once | SUBCUTANEOUS | Status: AC
  Administered 2015-09-26: 10 [IU] via SUBCUTANEOUS
  Filled 2015-09-26: qty 0.1

## 2015-09-26 MED ORDER — PREDNISONE 20 MG PO TABS: 20.0000 mg | ORAL_TABLET | Freq: Every day | ORAL | Status: DC

## 2015-09-26 MED ORDER — AZITHROMYCIN 250 MG PO TABS
500.0000 mg | ORAL_TABLET | ORAL | Status: DC
  Administered 2015-09-26 – 2015-09-27 (×2): 500 mg via ORAL
  Filled 2015-09-26 (×2): qty 2

## 2015-09-26 MED ORDER — PREDNISONE 20 MG PO TABS
40.0000 mg | ORAL_TABLET | Freq: Every day | ORAL | Status: AC
  Administered 2015-09-27 – 2015-09-28 (×2): 40 mg via ORAL
  Filled 2015-09-26 (×2): qty 2

## 2015-09-26 MED ORDER — INSULIN ASPART 100 UNIT/ML ~~LOC~~ SOLN
10.0000 [IU] | Freq: Once | SUBCUTANEOUS | Status: AC
  Administered 2015-09-26: 10 [IU] via SUBCUTANEOUS
  Filled 2015-09-26: qty 10

## 2015-09-26 MED ORDER — PREDNISONE 5 MG PO TABS: 5.0000 mg | ORAL_TABLET | Freq: Every day | ORAL | Status: DC

## 2015-09-26 MED ORDER — INSULIN ASPART 100 UNIT/ML ~~LOC~~ SOLN
15.0000 [IU] | Freq: Once | SUBCUTANEOUS | Status: AC
  Administered 2015-09-26: 15 [IU] via SUBCUTANEOUS
  Filled 2015-09-26: qty 15

## 2015-09-26 MED ORDER — INSULIN GLARGINE 100 UNIT/ML ~~LOC~~ SOLN
25.0000 [IU] | Freq: Every day | SUBCUTANEOUS | Status: DC
  Administered 2015-09-26: 25 [IU] via SUBCUTANEOUS
  Filled 2015-09-26 (×2): qty 0.25

## 2015-09-26 MED ORDER — PREDNISONE 10 MG PO TABS: 10.0000 mg | ORAL_TABLET | Freq: Every day | ORAL | Status: DC

## 2015-09-26 MED ORDER — PREDNISONE 20 MG PO TABS: 30.0000 mg | ORAL_TABLET | Freq: Every day | ORAL | Status: DC

## 2015-09-26 NOTE — Evaluation (Signed)
Physical Therapy Evaluation Patient Details Name: Troy [name] MRN: [id number] DOB: [birth year] Today's Date: 09/26/2015   History of Present Illness  Patient admitted with acute resp failure secondary to COPD exacerbation, initially put on BiPAP, now on 2L (which is his baseline). Patient has h/o Parkinson's, CVA, COPD, CHF.   Clinical Impression  Patient presented from home with acute respiratory failure, up to this point he has faired fairly well at home ambulating with RW into the community (church and restaurants). Today he is functioning at roughly his recent baseline physical level, able to transfer and perform bed mobility without overt need for PT assistance. He did require assistance to transfer from recliner to standing, though on repeat trial it appears this was due to hand placement (easier for him to have his hands on arm rails to start). After discussion with family of patient, they agree that patient appears to be able to manage return home with O2 and RW. Would recommend HHPT for safety evaluation and improve aerobic tolerance.    Follow Up Recommendations Home health PT    Equipment Recommendations       Recommendations for Other Services       Precautions / Restrictions Precautions Precautions: Fall Restrictions Weight Bearing Restrictions: No      Mobility  Bed Mobility Overal bed mobility: Needs Assistance Bed Mobility: Supine to Sit     Supine to sit: HOB elevated;Supervision;Min guard     General bed mobility comments: Patient uses bed rails to complete transfer slowly, though no assistance required.   Transfers Overall transfer level: Needs assistance Equipment used: Rolling walker (2 wheeled) Transfers: Sit to/from Stand Sit to Stand: Min guard         General transfer comment: No assistance required from bed, he did require assistance (mod) to transfer from recliner in hallway, however this was likely due to his hands starting on RW, when  performed in room from recliner no assistance required with use of recliner hand rails.   Ambulation/Gait Ambulation/Gait assistance: Min guard Ambulation Distance (Feet): 200 Feet   Gait Pattern/deviations: Decreased step length - right;Decreased step length - left;Trunk flexed   Gait velocity interpretation: Below normal speed for age/gender General Gait Details: Patient ambulates at appropriate gait speed, RW anterior to COM. Of note midway through his ambulatory bout his tele box read "Vtach", had patient sit and called "black box" in [place], PT was informed this was "artifact". Patient able to resume ambulation with O2 sats 92-94% on 2L of O2.   Stairs            Wheelchair Mobility    Modified Rankin (Stroke Patients Only)       Balance Overall balance assessment: Needs assistance Sitting-balance support: Bilateral upper extremity supported Sitting balance-Leahy Scale: Good     Standing balance support: Bilateral upper extremity supported Standing balance-Leahy Scale: Fair                               Pertinent Vitals/Pain Pain Assessment: No/denies pain    Home Living Family/patient expects to be discharged to:: Private residence Living Arrangements: Spouse/significant other;Children Available Help at Discharge: Family;Personal care attendant;Available 24 hours/day Type of Home: House Home Access: Stairs to enter Entrance Stairs-Rails: Can reach both Entrance Stairs-Number of Steps: 3 Home Layout: One level Home Equipment: Walker - 2 wheels;Cane - single point;Wheelchair - manual;Hospital bed;Toilet riser      Prior Function Level of Independence: Independent  with assistive device(s)         Comments: Pt ambulates in the home with caregiver nearly every day.  He is able to go to church weekly and occasionally go out to eat (uses walker out of the house)     Hand Dominance        Extremity/Trunk Assessment   Upper Extremity  Assessment: Overall WFL for tasks assessed           Lower Extremity Assessment: Overall WFL for tasks assessed         Communication   Communication: No difficulties;HOH (Requires PT to speak up while in room)  Cognition Arousal/Alertness: Awake/alert Behavior During Therapy: WFL for tasks assessed/performed Overall Cognitive Status: Within Functional Limits for tasks assessed                      General Comments      Exercises        Assessment/Plan    PT Assessment Patient needs continued PT services  PT Diagnosis Difficulty walking   PT Problem List Decreased strength;Decreased knowledge of use of DME;Decreased activity tolerance;Decreased balance;Cardiopulmonary status limiting activity  PT Treatment Interventions Gait training;DME instruction;Patient/family education;Therapeutic activities;Stair training;Therapeutic exercise;Balance training   PT Goals (Current goals can be found in the Care Plan section) Acute Rehab PT Goals Patient Stated Goal: To improve his walking tolerance  PT Goal Formulation: With patient/family Time For Goal Achievement: 10/10/15 Potential to Achieve Goals: Good    Frequency Min 2X/week   Barriers to discharge        Co-evaluation               End of Session Equipment Utilized During Treatment: Gait belt;Oxygen Activity Tolerance: Patient tolerated treatment well Patient left: in chair;with call bell/phone within reach;with chair alarm set;with nursing/sitter in room Nurse Communication: Mobility status         Time: [id number] PT Time Calculation (min) (ACUTE ONLY): 26 min   Charges:   PT Evaluation $PT Eval Moderate Complexity: 1 Procedure     PT G Codes:       Kerin RansomPatrick A McNamara, PT, DPT    09/26/2015, 5:18 PM

## 2015-09-26 NOTE — Progress Notes (Signed)
Inpatient Diabetes Program Recommendations  AACE/ADA: New Consensus Statement on Inpatient Glycemic Control (2015)  Target Ranges:  Prepandial:   less than 140 mg/dL      Peak postprandial:   less than 180 mg/dL (1-2 hours)      Critically ill patients:  140 - 180 mg/dL   Review of Glycemic Control:  Results for Troy [name], [name] (MRN [id number]) as of 09/26/2015 09:55  Ref. Range 09/25/2015 11:54 09/25/2015 16:10 09/25/2015 21:40 09/26/2015 02:58 09/26/2015 07:34  Glucose-Capillary Latest Ref Range: 65-99 mg/dL 295375 (H) 621400 (H) 308442 (H) 378 (H) 359 (H)  Diabetes history: Type 2 diabetes Outpatient Diabetes medications:  U500 insulin 4 units AM/ 6 units PM (this totals 50 units of u100 insulin), Onglyza 5 mg q PM, Glipizide 5-10 mg 2 times daily Current orders for Inpatient glycemic control:  Novolog moderate tid with meals and HS, Solumedrol 60 mg IV q 6 hours, Lantus 10 units q HS, Novolog 5 units tid with meals  Inpatient Diabetes Program Recommendations:    Please increase Lantus to 30 units q HS and consider increasing Novolog meal coverage to 8 units tid with meals.  Also may consider increasing frequency of Novolog correction to q 4 hours while patient in on IV steroids.  Text page sent to MD.   Thanks, Beryl MeagerJenny Jennel Mara, RN, BC-ADM Inpatient Diabetes Coordinator Pager [phone] (8a-5p)

## 2015-09-26 NOTE — Progress Notes (Signed)
Pt doesn't have a BIPAP in his room. Pt doesn't wish to wear a BIPAP

## 2015-09-26 NOTE — Progress Notes (Addendum)
[facility] [place] [facility]   PATIENT NAME: Troy CamelRobert Smiles    MR#:  [id number]  DATE OF BIRTH:  1924/03/31  SUBJECTIVE:  CHIEF COMPLAINT:   Chief Complaint  Patient presents with  . Shortness of Breath   - Admitted for acute respiratory failure secondary to COPD exacerbation. - Was on BiPAP on admission. Currently down to 3 L nasal cannula - Feeling much better today. Shortness of breath is significantly better and off BiPAP  REVIEW OF SYSTEMS:  Review of Systems  Constitutional: Positive for malaise/fatigue. Negative for fever and chills.  HENT: Negative for ear discharge, ear pain and nosebleeds.   Eyes: Negative for blurred vision.  Respiratory: Positive for shortness of breath and wheezing. Negative for cough.   Cardiovascular: Negative for chest pain, palpitations and leg swelling.  Gastrointestinal: Negative for nausea, vomiting, abdominal pain, diarrhea and constipation.  Genitourinary: Negative for dysuria.  Musculoskeletal: Negative for myalgias and back pain.  Neurological: Negative for dizziness, sensory change, speech change, focal weakness, seizures and headaches.  Psychiatric/Behavioral: Negative for depression.    DRUG ALLERGIES:  No Known Allergies  VITALS:  Blood pressure 124/70, pulse 79, temperature 97.6 F (36.4 C), temperature source Oral, resp. rate 19, height 5\' 9"  (1.753 m), weight 103.6 kg (228 lb 6.3 oz), SpO2 96 %.  PHYSICAL EXAMINATION:  Physical Exam  GENERAL:  82 y.o.-year-old patient lying in the bed, dyspneic, on 3l o2, sleepy- but arousable.  EYES: Pupils equal, round, reactive to light and accommodation. No scleral icterus. Extraocular muscles intact.  HEENT: Head atraumatic, normocephalic. Oropharynx and nasopharynx clear.  NECK:  Supple, no jugular venous distention. No thyroid enlargement, no tenderness.  LUNGS: Moderate air movement, scattered end expiratory wheezing, no rales,rhonchi or crepitation.  No use of accessory muscles of respiration.  CARDIOVASCULAR: S1, S2 normal. No rubs, or gallops. 3/6 systolic murmur ABDOMEN: Soft, nontender, nondistended. Bowel sounds present. No organomegaly or mass.  EXTREMITIES: No pedal edema, cyanosis, or clubbing.  NEUROLOGIC: Cranial nerves II through XII are intact. Muscle strength 5/5 in all extremities. Sensation intact. Gait not checked.  PSYCHIATRIC: The patient is sleepy, arousable, then following commands and answering simple questions  SKIN: No obvious rash, lesion, or ulcer.    LABORATORY PANEL:   CBC  Recent Labs Lab 09/25/15 0616  WBC 10.2  HGB 12.7*  HCT 38.6*  PLT 150   ------------------------------------------------------------------------------------------------------------------  Chemistries   Recent Labs Lab 09/26/15 0351  NA 134*  K 4.4  CL 98*  CO2 24  GLUCOSE 403*  BUN 39*  CREATININE 1.28*  CALCIUM 9.0   ------------------------------------------------------------------------------------------------------------------  Cardiac Enzymes  Recent Labs Lab 09/24/15 2230  TROPONINI 0.03   ------------------------------------------------------------------------------------------------------------------  RADIOLOGY:  Dg Chest 2 View  09/25/2015  CLINICAL DATA:  Congestive failure EXAM: CHEST  2 VIEW COMPARISON:  09/24/2015 FINDINGS: Cardiac shadow remains enlarged. Pacing device is again seen and stable. Postsurgical changes are again noted. The lungs are well aerated bilaterally. No focal infiltrate or sizable effusion is seen. Vascular congestion and interstitial edema remains. IMPRESSION: No significant interval change from the prior exam. No new focal infiltrate is noted. Electronically Signed   By: Alcide CleverMark  Lukens M.D.   On: 09/25/2015 15:50   Dg Chest Portable 1 View  09/24/2015  CLINICAL DATA:  Acute onset of shortness of breath and wheezing. Initial encounter. EXAM: PORTABLE CHEST 1 VIEW COMPARISON:   Chest radiograph performed 08/09/2015 FINDINGS: Vascular congestion is noted. Mildly increased interstitial markings could reflect minimal interstitial  edema. No pleural effusion or pneumothorax is seen. The cardiomediastinal silhouette is enlarged. The patient is status post median sternotomy. A pacemaker is noted overlying the left chest wall, with a single lead ending overlying the right ventricle. No acute osseous abnormalities are seen. IMPRESSION: Vascular congestion and cardiomegaly. Mildly increased interstitial markings could reflect minimal interstitial edema. Electronically Signed   By: Roanna Raider M.D.   On: 09/24/2015 23:35    EKG:   Orders placed or performed during the hospital encounter of 09/24/15  . EKG 12-Lead  . EKG 12-Lead  . ED EKG  . ED EKG    ASSESSMENT AND PLAN:   82 year old male with past medical history significant for hypertension, insulin-dependent diabetes mellitus, Parkinson's disease, chronic A. fib on Coumadin, chronic systolic CHF, COPD on home oxygen and CK D presents to the hospital secondary to worsening shortness of breath  #1 acute on chronic hypoxic respiratory failure-secondary to COPD exacerbation. -Required BiPAP on admission.Off BiPAP overnight and clinically doing fine -Currently been to 3 L oxygen. Transfer patient to off unit telemetry -Continue steroids, nebulizer treatments and inhalers -Chest x-ray without any infiltrate, patient does have some cough. Continue azithromycin  #2 chronic systolic CHF, EF of 45%-chest x-ray with, chronic pulmonary congestion. -Continue Lasix at home dose. -Also on lisinopril and metoprolol  #3 diabetes mellitus with hyperglycemia-patient on U500 insulin at home. -Appreciate diabetes coordinator input.  Currently patient is hyperglycemic with change Lantus to 25 units subcutaneous daily at bedtime and go ahead and give Levemir 10 units at this time- -Continue sliding scale insulin  #4 chronic atrial  fibrillation-rate controlled. On metoprolol -On warfarin and INR is therapeutic.  #5 DVT prophylaxis-on warfarin,INR 2.86 today   Physical therapy consult for generalized weakness starting from tomorrow    All the records are reviewed and case discussed with Care Management/Social Workerr. Management plans discussed with the patient, family and they are in agreement.  CODE STATUS:DNR  TOTAL TIME TAKING CARE OF THIS PATIENT: 37 minutes.   POSSIBLE D/C IN 2 DAYS, DEPENDING ON CLINICAL CONDITION.   Ramonita Lab M.D on 09/26/2015 at 2:40 PM  Between 7am to 6pm - Pager - [phone]  After 6pm go to [email or web address] - password [email or web address] [facility]  [facility]  Office  [phone]  CC: Primary care physician; Barbette Reichmann, MD

## 2015-09-26 NOTE — Progress Notes (Signed)
Notified MD about pt elevated blood sugar, new order received. Pt to receive 10 units novolog (from sliding scale) and 10 units levemir.  Passed on report to BraveJeanna, RN continue to assess.

## 2015-09-26 NOTE — Progress Notes (Addendum)
Prime Doc paged regarding B.S. Still elevated after 20 units of Novolog and 10 units of Levemir. Ordered for 10 units of Novolog once and to recheck in an hour.

## 2015-09-26 NOTE — Progress Notes (Signed)
Notified Dr. Arita MissGorou of pt blood sugar in 359. New orders received. Pt resting in bed continue to assess.

## 2015-09-26 NOTE — Progress Notes (Signed)
Alert and oriented. Denies pain. Incontintent of urine of large amount.  Condom cath replaced.  Lungs with few wheezes.  sats good on 2l/[place].  Blood sugars remain high with steroids. Afib on monitor.  Family and pt state he is looking and feeling much better.  Report given to nurs for room 215

## 2015-09-26 NOTE — Progress Notes (Signed)
ANTICOAGULATION CONSULT NOTE - Initial Consult  Pharmacy Consult for warfarin Indication: atrial fibrillation  No Known Allergies  Patient Measurements: Height: 5\' 9"  (175.3 cm) Weight: 228 lb 6.3 oz (103.6 kg) IBW/kg (Calculated) : 70.7  Vital Signs: Temp: 97.6 F (36.4 C) (05/24 1143) Temp Source: Oral (05/24 1143) BP: 124/70 mmHg (05/24 1143) Pulse Rate: 79 (05/24 1143)  Labs:  Recent Labs  09/24/15 2230 09/25/15 0616 09/26/15 0351  HGB 12.9* 12.7*  --   HCT 38.6* 38.6*  --   PLT 164 150  --   LABPROT 27.6*  --  29.5*  INR 2.61  --  2.86  CREATININE 1.27* 1.26* 1.28*  TROPONINI 0.03  --   --     Estimated Creatinine Clearance: 45.5 mL/min (by C-G formula based on Cr of 1.28).   Medical History: Past Medical History  Diagnosis Date  . Diabetes mellitus without complication (HCC)   . Hypertension   . UTI (lower urinary tract infection)   . Parkinson's disease (HCC)   . TIA (transient ischemic attack)   . Chronic atrial fibrillation (HCC)     a. on Couamdin; b. CHADS2VASc at least 7 (CHF, HTN, age x 2, DM, stroke x 2); c. s/p MAZE 1999  . Chronic systolic CHF (congestive heart failure) (HCC)     a. echo 08/01/2015: EF of 45%, mild LVH, mitral valve with annular calcification, mitral valve partially mobile  . Stroke (HCC)   . Hypothyroidism   . Normal coronary arteries     a. by cardiac cath 1999  . Mitral valve prolapse     a. s/p mitral valve repair 1999  . CKD (chronic kidney disease), stage III   . COPD (chronic obstructive pulmonary disease) (HCC)    Assessment: Pharmacy consulted for warfarin management for 82 yo male with history of atrial fibrillation. Patient currently ordered home dose of warfarin 6mg  Monday/Wednesday/Friday and 3mg  on Tuesday/Thursday/Saturday. Patient currently ordered azithromycin 500mg  PO Q24hr which may lead to elevated INRs with home regimen.   Goal of Therapy:  INR 2-3 Monitor platelets by anticoagulation protocol: Yes    Plan:  Will continue with home regimen. Will obtain follow-up INR with am labs.    Pharmacy will continue to monitor and adjust per consult.    Raijon Lindfors L 09/26/2015,11:56 AM

## 2015-09-27 LAB — BASIC METABOLIC PANEL
Anion gap: 6 (ref 5–15)
BUN: 43 mg/dL — AB (ref 6–20)
CHLORIDE: 103 mmol/L (ref 101–111)
CO2: 29 mmol/L (ref 22–32)
CREATININE: 1.17 mg/dL (ref 0.61–1.24)
Calcium: 8.7 mg/dL — ABNORMAL LOW (ref 8.9–10.3)
GFR calc Af Amer: >60 mL/min (ref >60)
GFR calc non Af Amer: 53 mL/min — ABNORMAL LOW (ref >60)
GLUCOSE: 162 mg/dL — AB (ref 65–99)
POTASSIUM: 4 mmol/L (ref 3.5–5.1)
Sodium: 138 mmol/L (ref 135–145)

## 2015-09-27 LAB — PROTIME-INR
INR: 3.83
Prothrombin Time: 36.8 seconds — ABNORMAL HIGH (ref 11.4–15.0)

## 2015-09-27 LAB — GLUCOSE, CAPILLARY
GLUCOSE-CAPILLARY: 132 mg/dL — AB (ref 65–99)
GLUCOSE-CAPILLARY: 297 mg/dL — AB (ref 65–99)
GLUCOSE-CAPILLARY: 304 mg/dL — AB (ref 65–99)
Glucose-Capillary: 166 mg/dL — ABNORMAL HIGH (ref 65–99)
Glucose-Capillary: 257 mg/dL — ABNORMAL HIGH (ref 65–99)

## 2015-09-27 MED ORDER — INSULIN GLARGINE 100 UNIT/ML ~~LOC~~ SOLN
40.0000 [IU] | Freq: Every day | SUBCUTANEOUS | Status: DC
  Administered 2015-09-27: 40 [IU] via SUBCUTANEOUS
  Filled 2015-09-27 (×3): qty 0.4

## 2015-09-27 NOTE — Progress Notes (Signed)
No BIPAP in room. Pt is in no distress. Pt doesn't wish to wear BIPAP at this time

## 2015-09-27 NOTE — Progress Notes (Signed)
ANTICOAGULATION CONSULT NOTE - Initial Consult  Pharmacy Consult for warfarin Indication: atrial fibrillation  No Known Allergies  Patient Measurements: Height: 5\' 9"  (175.3 cm) Weight: 234 lb 8 oz (106.369 kg) IBW/kg (Calculated) : 70.7  Vital Signs: Temp: 97.8 F (36.6 C) (05/25 0434) Temp Source: Oral (05/25 0434) BP: 130/69 mmHg (05/25 0434) Pulse Rate: 71 (05/25 0434)  Labs:  Recent Labs  09/24/15 2230 09/25/15 0616 09/26/15 0351 09/27/15 0403  HGB 12.9* 12.7*  --   --   HCT 38.6* 38.6*  --   --   PLT 164 150  --   --   LABPROT 27.6*  --  29.5* 36.8*  INR 2.61  --  2.86 3.83  CREATININE 1.27* 1.26* 1.28* 1.17  TROPONINI 0.03  --   --   --     Estimated Creatinine Clearance: 50.5 mL/min (by C-G formula based on Cr of 1.17).   Medical History: Past Medical History  Diagnosis Date  . Diabetes mellitus without complication (HCC)   . Hypertension   . UTI (lower urinary tract infection)   . Parkinson's disease (HCC)   . TIA (transient ischemic attack)   . Chronic atrial fibrillation (HCC)     a. on Couamdin; b. CHADS2VASc at least 7 (CHF, HTN, age x 2, DM, stroke x 2); c. s/p MAZE 1999  . Chronic systolic CHF (congestive heart failure) (HCC)     a. echo 08/01/2015: EF of 45%, mild LVH, mitral valve with annular calcification, mitral valve partially mobile  . Stroke (HCC)   . Hypothyroidism   . Normal coronary arteries     a. by cardiac cath 1999  . Mitral valve prolapse     a. s/p mitral valve repair 1999  . CKD (chronic kidney disease), stage III   . COPD (chronic obstructive pulmonary disease) (HCC)    Assessment: Pharmacy consulted for warfarin management for 82 yo male with history of atrial fibrillation. Patient currently ordered home dose of warfarin 6mg  Monday/Wednesday/Friday and 3mg  on Tuesday/Thursday/Saturday. Patient currently ordered azithromycin 500mg  PO Q24hr which may lead to elevated INRs with home regimen.   Goal of Therapy:  INR  2-3 Monitor platelets by anticoagulation protocol: Yes   Plan:  INR is supratherapeutic. Will hold this evenings dose and recheck INR in the AM.  Pharmacy will continue to monitor and adjust per consult.    Olene FlossMelissa D Bret Vanessen, Pharm.D Clinical Pharmacist   09/27/2015,7:41 AM

## 2015-09-27 NOTE — Care Management Important Message (Signed)
Important Message  Patient Details  Name: Troy BurnRobert S Tebbetts MRN: [id number] Date of Birth: March 02, 1924   Medicare Important Message Given:  Yes    Olegario MessierKathy A Roux Brandy 09/27/2015, 10:59 AM

## 2015-09-27 NOTE — Care Management (Signed)
Patient admitted to icu stepdown for need of continuous bipap.  He was transferred to [facility] on 5/25.  He resides at home with his wife.  Has a hired caregiver 8-5 5 days a week.  Patient uses walker and cane for ambulation and then adds "sometime I just use my two legs."  he has chronic home 02 through [facility].  Has a walk in shower but his caregiver has to assist with bathing, grooming and dressing process.  Had [facility] last year and would wish to receive nurse and therapy therapy service with that agency again.  Discussed that CM would have to be assessed for [facility] criteria.  Notified [place] with [facility].  No issues accessing medical care, obtaining medications or tranposrtation

## 2015-09-27 NOTE — Progress Notes (Signed)
Inpatient Diabetes Program Recommendations  AACE/ADA: New Consensus Statement on Inpatient Glycemic Control (2015)  Target Ranges:  Prepandial:   less than 140 mg/dL      Peak postprandial:   less than 180 mg/dL (1-2 hours)      Critically ill patients:  140 - 180 mg/dL   Review of Glycemic Control:  Results for Troy [name], Troy [name] (MRN [id number]) as of 09/27/2015 12:53  Ref. Range 09/26/2015 18:34 09/26/2015 21:32 09/27/2015 02:53 09/27/2015 07:32 09/27/2015 11:39  Glucose-Capillary Latest Ref Range: 65-99 mg/dL 409467 (H) 811384 (H) 914166 (H) 132 (H) 257 (H)   Diabetes history: Type 2 diabetes Outpatient Diabetes medications:  U500 insulin 4 units AM/ 6 units PM (this totals 50 units of u100 insulin), Onglyza 5 mg q PM, Glipizide 5-10 mg 2 times daily Current orders for Inpatient glycemic control:  Prednisone Taper Lantus 25 units daily, Novolog moderate tid with meals, Novolog 5 units tid with meals  Inpatient Diabetes Program Recommendations:    Please consider increasing Novolog meal coverage to 8 units tid with meals. Steroids being tapered which should also help improve blood sugars.   Thanks, Beryl MeagerJenny Vivica Dobosz, RN, BC-ADM Inpatient Diabetes Coordinator Pager [phone] (8a-5p)

## 2015-09-27 NOTE — Progress Notes (Signed)
[facility] - [place] at [facility]   PATIENT NAME: Troy [name]    MR#:  [id number]  DATE OF BIRTH:  06-24-1923  SUBJECTIVE:  CHIEF COMPLAINT:   Chief Complaint  Patient presents with  . Shortness of Breath   - Admitted for acute respiratory failure secondary to COPD exacerbation. - Was on BiPAP on admission. Currently down to 3 L nasal cannula - Feeling much better today. Shortness of breath is significantly better and off BiPAPBut reporting wheezing and exertional dyspnea  REVIEW OF SYSTEMS:  Review of Systems  Constitutional: Positive for malaise/fatigue. Negative for fever and chills.  HENT: Negative for ear discharge, ear pain and nosebleeds.   Eyes: Negative for blurred vision.  Respiratory: Positive for shortness of breath and wheezing. Negative for cough.   Cardiovascular: Negative for chest pain, palpitations and leg swelling.  Gastrointestinal: Negative for nausea, vomiting, abdominal pain, diarrhea and constipation.  Genitourinary: Negative for dysuria.  Musculoskeletal: Negative for myalgias and back pain.  Neurological: Negative for dizziness, sensory change, speech change, focal weakness, seizures and headaches.  Psychiatric/Behavioral: Negative for depression.    DRUG ALLERGIES:  No Known Allergies  VITALS:  Blood pressure 139/86, pulse 77, temperature 97.7 F (36.5 C), temperature source Oral, resp. rate 18, height  (1.753 m), weight 106.369 kg (234 lb 8 oz), SpO2 97 %.  PHYSICAL EXAMINATION:  Physical Exam  GENERAL:  82 y.o.-year-old patient lying in the bed, dyspneic, on 3l o2, sleepy- but arousable.  EYES: Pupils equal, round, reactive to light and accommodation. No scleral icterus. Extraocular muscles intact.  HEENT: Head atraumatic, normocephalic. Oropharynx and nasopharynx clear.  NECK:  Supple, no jugular venous distention. No thyroid enlargement, no tenderness.  LUNGS: Moderate air movement, scattered end expiratory  wheezing, no rales,rhonchi or crepitation. No use of accessory muscles of respiration.  CARDIOVASCULAR: S1, S2 normal. No rubs, or gallops. 3/6 systolic murmur ABDOMEN: Soft, nontender, nondistended. Bowel sounds present. No organomegaly or mass.  EXTREMITIES: No pedal edema, cyanosis, or clubbing.  NEUROLOGIC: Cranial nerves II through XII are intact. Muscle strength 5/5 in all extremities. Sensation intact. Gait not checked.  PSYCHIATRIC: The patient is sleepy, arousable, then following commands and answering simple questions  SKIN: No obvious rash, lesion, or ulcer.    LABORATORY PANEL:   CBC  Recent Labs Lab 09/25/15 0616  WBC 10.2  HGB 12.7*  HCT 38.6*  PLT 150   ------------------------------------------------------------------------------------------------------------------  Chemistries   Recent Labs Lab 09/27/15 0403  NA 138  K 4.0  CL 103  CO2 29  GLUCOSE 162*  BUN 43*  CREATININE 1.17  CALCIUM 8.7*   ------------------------------------------------------------------------------------------------------------------  Cardiac Enzymes  Recent Labs Lab 09/24/15 2230  TROPONINI 0.03   ------------------------------------------------------------------------------------------------------------------  RADIOLOGY:  No results found.  EKG:   Orders placed or performed during the hospital encounter of 09/24/15  . EKG 12-Lead  . EKG 12-Lead  . ED EKG  . ED EKG    ASSESSMENT AND PLAN:   82 year old male with past medical history significant for hypertension, insulin-dependent diabetes mellitus, Parkinson's disease, chronic A. fib on Coumadin, chronic systolic CHF, COPD on home oxygen and CK D presents to the hospital secondary to worsening shortness of breath  #1 acute on chronic hypoxic respiratory failure-secondary to COPD exacerbation. -Required BiPAP on admission.Off BiPAP overnight and clinically doing fine -Currently been to 3 L oxygen,Wean off to  home oxygen -Continue Tapering steroids, nebulizer treatments and inhalers -Chest x-ray without any infiltrate, patient does have some cough.  Continue azithromycin  #2 chronic systolic CHF, EF of 45%-chest x-ray with, chronic pulmonary congestion. -Continue Lasix at home dose. -Also on lisinopril and metoprolol  #3 diabetes mellitus with hyperglycemia-patient on U500 insulin at home. -Appreciate diabetes coordinator input.  change Lantus to 40 units subcutaneous daily at bedtime -Continue sliding scale insulin  #4 chronic atrial fibrillation-rate controlled. On metoprolol -On warfarin and INR is 3.83. Hold Coumadin tonight.  #5 DVT prophylaxis-on warfarin,INR 3.83 today, hold Coumadin tonight   Physical therapy recommended home health PT    All the records are reviewed and case discussed with Care Management/Social Workerr. Management plans discussed with the patient, family and they are in agreement.  CODE STATUS:DNR  TOTAL TIME TAKING CARE OF THIS PATIENT: 37 minutes.   POSSIBLE D/C IN am  DAYS, DEPENDING ON CLINICAL CONDITION.   Ramonita LabGouru, Tamarius Rosenfield M.D on 09/27/2015 at 4:14 PM  Between 7am to 6pm - Pager - [phone]  After 6pm go to [email or web address] - password [email or web address] [facility]  [facility]  Office  [phone]  CC: Primary care physician; Barbette ReichmannHANDE,VISHWANATH, MD

## 2015-09-28 ENCOUNTER — Inpatient Hospital Stay (HOSPITAL_COMMUNITY)
Admit: 2015-09-28 | Discharge: 2015-09-28 | Disposition: A | Discharge status: Home / Self Care | Payer: Medicare Other | Attending: Internal Medicine | Admitting: Internal Medicine

## 2015-09-28 DIAGNOSIS — R0600 Dyspnea, unspecified: Secondary | ICD-10-CM

## 2015-09-28 DIAGNOSIS — J9621 Acute and chronic respiratory failure with hypoxia: Secondary | ICD-10-CM

## 2015-09-28 LAB — ECHOCARDIOGRAM COMPLETE
HEIGHTINCHES: 69 in
Weight: 3707.2 oz

## 2015-09-28 LAB — GLUCOSE, CAPILLARY
GLUCOSE-CAPILLARY: 332 mg/dL — AB (ref 65–99)
GLUCOSE-CAPILLARY: 311 mg/dL — AB (ref 65–99)
Glucose-Capillary: 128 mg/dL — ABNORMAL HIGH (ref 65–99)
Glucose-Capillary: 134 mg/dL — ABNORMAL HIGH (ref 65–99)
Glucose-Capillary: 337 mg/dL — ABNORMAL HIGH (ref 65–99)

## 2015-09-28 LAB — PROTIME-INR
INR: 3.4
PROTHROMBIN TIME: 33.6 s — AB (ref 11.4–15.0)

## 2015-09-28 MED ORDER — PREDNISONE 10 MG (21) PO TBPK: 10.0000 mg | ORAL_TABLET | Freq: Every day | ORAL | Status: DC

## 2015-09-28 MED ORDER — INSULIN ASPART 100 UNIT/ML ~~LOC~~ SOLN
18.0000 [IU] | Freq: Once | SUBCUTANEOUS | Status: AC
  Administered 2015-09-28: 18 [IU] via SUBCUTANEOUS

## 2015-09-28 MED ORDER — AZITHROMYCIN 250 MG PO TABS: 250.0000 mg | ORAL_TABLET | Freq: Every day | ORAL | Status: DC

## 2015-09-28 MED ORDER — WARFARIN SODIUM 2 MG PO TABS: 3.0000 mg | ORAL_TABLET | Freq: Once | ORAL | Status: DC

## 2015-09-28 NOTE — Progress Notes (Signed)
ANTICOAGULATION CONSULT NOTE - Initial Consult  Pharmacy Consult for warfarin Indication: atrial fibrillation  No Known Allergies  Patient Measurements: Height: 5\' 9"  (175.3 cm) Weight: 231 lb 11.2 oz (105.098 kg) IBW/kg (Calculated) : 70.7  Vital Signs: Temp: 97.3 F (36.3 C) (05/26 0426) Temp Source: Oral (05/26 0426) BP: 145/79 mmHg (05/26 0426) Pulse Rate: 64 (05/26 0426)  Labs:  Recent Labs  09/26/15 0351 09/27/15 0403 09/28/15 0355  LABPROT 29.5* 36.8* 33.6*  INR 2.86 3.83 3.40  CREATININE 1.28* 1.17  --     Estimated Creatinine Clearance: 50.2 mL/min (by C-G formula based on Cr of 1.17).   Medical History: Past Medical History  Diagnosis Date  . Diabetes mellitus without complication (HCC)   . Hypertension   . UTI (lower urinary tract infection)   . Parkinson's disease (HCC)   . TIA (transient ischemic attack)   . Chronic atrial fibrillation (HCC)     a. on Couamdin; b. CHADS2VASc at least 7 (CHF, HTN, age x 2, DM, stroke x 2); c. s/p MAZE 1999  . Chronic systolic CHF (congestive heart failure) (HCC)     a. echo 08/01/2015: EF of 45%, mild LVH, mitral valve with annular calcification, mitral valve partially mobile  . Stroke (HCC)   . Hypothyroidism   . Normal coronary arteries     a. by cardiac cath 1999  . Mitral valve prolapse     a. s/p mitral valve repair 1999  . CKD (chronic kidney disease), stage III   . COPD (chronic obstructive pulmonary disease) (HCC)    Assessment: Pharmacy consulted for warfarin management for 82 yo male with history of atrial fibrillation. Patient currently ordered home dose of warfarin 6mg  Monday/Wednesday/Friday/Sunday and 3mg  on Tuesday/Thursday/Saturday. Patient currently ordered azithromycin 500mg  PO Q24hr which may lead to elevated INRs with home regimen.   Goal of Therapy:  INR 2-3 Monitor platelets by anticoagulation protocol: Yes   Plan:  INR is supratherapeutic. Held warfarin last night. INR down to 3.4. Will  resume warfarin at a lower dose. Will give 3mg  this evening. Recheck INR in the AM.   Pharmacy will continue to monitor and adjust per consult.    Olene FlossMelissa D Lela Gell, Pharm.D Clinical Pharmacist   09/28/2015,8:21 AM

## 2015-09-28 NOTE — Progress Notes (Signed)
New referral for [facility] home [facility] who will need SN/PT at time of discharge. Patient is a 82yo gentleman who was [facility] to [facility]22[facility]17 with chief complaint of SOB and Dx- acute respiratory failure secondary to COPD exacerbation.  Patient was placed on BiPAP on admission and is now currently weaned to 3L via Nasal Cannula.  Patient remains SOB at rest.  Patient has past medical history of HTN, DMII, Parkinsons, A-fib on Coumadin, CHF (EF 45%), COPD, TIA, CVA, hypotension, mitral valve prolapse and CKD. Patients PCP is Dr. Marcello FennelHande.  Patient is a former [facility] patient with 2 prior admission in fall of 2016.  Patient remains appropriate for LP referral with COPD/CHF exacerbation with life expectancy <12 months.  I spoke with patient and caregiver Ross Marcusose Stillwell regarding [facility] services.  Both request admission to services upon discharge.   Both in agreement with admission next week.   Hospital information faxed to referral intake.  Patient was to discharge home today, however patient has had increase in oxygen need from 3L to 3.5L this morning.  Home oxygen requirement prior to hospitalization was 2L/[place].   Will continue to follow through final disposition.  Thank you for allowing participation in this patient's care.  Norris CrossKara H. Marshall, MA, BSN, RN, FNE [facility]

## 2015-09-28 NOTE — Care Management (Signed)
Spoke with attending and anticipate discharge today.  Would like to attempt to wean 02 and obtain echo prior to leaving.  Script obtained for the suction machine that wife has requested. Patient was accepted by [facility] for services.  Notified agency of  anticipated discharge.

## 2015-09-28 NOTE — Discharge Summary (Signed)
[facility] Physicians - [place] at [facility]   PATIENT NAME: Troy [name]    MR#:  [id number]  DATE OF BIRTH:  1923-06-18  DATE OF ADMISSION:  09/24/2015 ADMITTING PHYSICIAN: Oralia Manis, MD  DATE OF DISCHARGE: 09/28/2015  5:52 PM  PRIMARY CARE PHYSICIAN: Barbette Reichmann, MD     ADMISSION DIAGNOSIS:  COPD exacerbation (HCC) [J44.1]  DISCHARGE DIAGNOSIS:  Principal Problem:   Acute on chronic respiratory failure with hypoxia (HCC) Active Problems:   Acute on chronic diastolic CHF (congestive heart failure), NYHA class 1 (HCC)   COPD exacerbation (HCC)   Chronic atrial fibrillation (HCC)   Diabetes (HCC)   HTN (hypertension)   Parkinson's disease (HCC)   Hypothyroidism   SECONDARY DIAGNOSIS:   Past Medical History  Diagnosis Date  . Diabetes mellitus without complication (HCC)   . Hypertension   . UTI (lower urinary tract infection)   . Parkinson's disease (HCC)   . TIA (transient ischemic attack)   . Chronic atrial fibrillation (HCC)     a. on Couamdin; b. CHADS2VASc at least 7 (CHF, HTN, age x 2, DM, stroke x 2); c. s/p MAZE 1999  . Chronic systolic CHF (congestive heart failure) (HCC)     a. echo 08/01/2015: EF of 45%, mild LVH, mitral valve with annular calcification, mitral valve partially mobile  . Stroke (HCC)   . Hypothyroidism   . Normal coronary arteries     a. by cardiac cath 1999  . Mitral valve prolapse     a. s/p mitral valve repair 1999  . CKD (chronic kidney disease), stage III   . COPD (chronic obstructive pulmonary disease) (HCC)     .pro HOSPITAL COURSE:   The patient is 82 year old male with history of diabetes, hypertension, Parkinson's disease, TIA, chronic a. Fib, chronic systolic CHF who presented to the hospital with dyspnea and wheezing. On arrival to ER he was noted to have normal BNP, his chest xray showed vascular congestion, cardiomegaly, no infiltrates. The patient initially required BIPAP due to hypoxia . He was  initiated on antibiotics, steroids, inhalation therapy, nebulizers and improved. He was weaned down to his usual O2 per [place] level and his O2 saturation remained stable at rest and on exertion, he was felt to be stable to be discharged home. Discussion by problem: #1 acute on chronic hypoxic respiratory failure-secondary to COPD exacerbation. -Required BiPAP on admission. -weaned down to 2 L oxygen per [place], his baseline. -Continue to taper steroids at home, antibiotics for 2 more days, nebulizer treatments and inhalers -Chest x-ray without any infiltrate. Sputum cx not obtained.  #2 chronic systolic CHF, EF of 45%-chest x-ray with some, likely  chronic pulmonary congestion. -Continue Lasix at home doses. -Continue lisinopril and metoprolol, may need to be advanced,. Please follow blood pressure readings closely, as SBP was 145 in the hospital   #3 diabetes mellitus with hyperglycemia-patient on U500 insulin at home. -Appreciate diabetes coordinator input.  patient was hyperglycemic with steroids, resume home medications upon dc home  #4 chronic atrial fibrillation-rate controlled. On metoprolol -On warfarin and INR was supratherapeutic, recommended to resume coumadin and recheck protime/INR level on Monday( patient was given only half of usual dose of coumadin today, discussed with pharmacist today) .  #5 DVT prophylaxis-on warfarin,INR 3.4 today, recheck on Monday   DISCHARGE CONDITIONS:   stable  CONSULTS OBTAINED:  Treatment Team:  Ramonita Lab, MD  DRUG ALLERGIES:  No Known Allergies  DISCHARGE MEDICATIONS:   Discharge Medication List as  of 09/28/2015  4:52 PM    START taking these medications   Details  azithromycin (ZITHROMAX) 250 MG tablet Take 1 tablet (250 mg total) by mouth daily., Starting 09/28/2015, Until Discontinued, Normal    predniSONE (STERAPRED UNI-PAK 21 TAB) 10 MG (21) TBPK tablet Take 1 tablet (10 mg total) by mouth daily. Please take 6 pills in the morning  on the day 1 and 2, then taper by one pill every 2 days until finished. Thank you, Starting 09/28/2015, Until Discontinued, Normal      CONTINUE these medications which have NOT CHANGED   Details  albuterol (PROVENTIL HFA) 108 (90 Base) MCG/ACT inhaler Inhale 2 puffs into the lungs every 4 (four) hours as needed for wheezing or shortness of breath., Starting 07/11/2015, Until Discontinued, Print    albuterol (PROVENTIL) (2.5 MG/3ML) 0.083% nebulizer solution Take 2.5 mg by nebulization every 6 (six) hours as needed for wheezing or shortness of breath., Until Discontinued, Historical Med    carbidopa-levodopa (SINEMET IR) 25-250 MG tablet Take 1 tablet by mouth 3 (three) times daily., Until Discontinued, Historical Med    cetirizine (ZYRTEC) 10 MG tablet Take 10 mg by mouth daily., Until Discontinued, Historical Med    !! furosemide (LASIX) 20 MG tablet Take 20 mg by mouth every evening., Until Discontinued, Historical Med    !! furosemide (LASIX) 40 MG tablet Take 40 mg by mouth every morning. , Until Discontinued, Historical Med    glipiZIDE (GLUCOTROL) 10 MG tablet Take 5-10 mg by mouth 2 (two) times daily. Pt takes one tablet in the morning and one-half tablet in the evening., Until Discontinued, Historical Med    insulin regular human CONCENTRATED (HUMULIN R) 500 UNIT/ML injection Inject 4-6 Units into the skin 2 (two) times daily with a meal. Pt uses four units with breakfast and six units with dinner., Until Discontinued, Historical Med    lisinopril (PRINIVIL,ZESTRIL) 40 MG tablet Take 40 mg by mouth at bedtime. , Until Discontinued, Historical Med    metoprolol succinate (TOPROL-XL) 25 MG 24 hr tablet Take 1 tablet (25 mg total) by mouth every evening. Take with or immediately following a meal., Starting 08/06/2015, Until Discontinued, Normal    Multiple Vitamins-Minerals (PRESERVISION AREDS 2) CAPS Take 1 capsule by mouth 2 (two) times daily., Until Discontinued, Historical Med     saxagliptin HCl (ONGLYZA) 2.5 MG TABS tablet Take 5 mg by mouth every evening., Until Discontinued, Historical Med    Tetrahydroz-Dextran-PEG-Povid (EYE DROPS ADVANCED RELIEF) 0.05-0.1-1-1 % SOLN Place 1 drop into both eyes daily., Until Discontinued, Historical Med    !! warfarin (COUMADIN) 3 MG tablet Take 3 mg by mouth every evening. Pt takes on Tuesday, Thursday, and Saturday., Until Discontinued, Historical Med    !! warfarin (COUMADIN) 6 MG tablet Take 6 mg by mouth every evening. Pt takes on Monday, Wednesday, Friday, and Sunday., Until Discontinued, Historical Med     !! - Potential duplicate medications found. Please discuss with provider.       DISCHARGE INSTRUCTIONS:    Patient is to follow up with PCP in 2-3 days  If you experience worsening of your admission symptoms, develop shortness of breath, life threatening emergency, suicidal or homicidal thoughts you must seek medical attention immediately by calling 911 or calling your MD immediately  if symptoms less severe.  You Must read complete instructions/literature along with all the possible adverse reactions/side effects for all the Medicines you take and that have been prescribed to you. Take any new Medicines  after you have completely understood and accept all the possible adverse reactions/side effects.   Please note  You were cared for by a hospitalist during your hospital stay. If you have any questions about your discharge medications or the care you received while you were in the hospital after you are discharged, you can call the unit and asked to speak with the hospitalist on call if the hospitalist that took care of you is not available. Once you are discharged, your primary care physician will handle any further medical issues. Please note that NO REFILLS for any discharge medications will be authorized once you are discharged, as it is imperative that you return to your primary care physician (or establish a  relationship with a primary care physician if you do not have one) for your aftercare needs so that they can reassess your need for medications and monitor your lab values.    Today   CHIEF COMPLAINT:   Chief Complaint  Patient presents with  . Shortness of Breath    HISTORY OF PRESENT ILLNESS:  Mayer CamelRobert Hakim  is a [age] y.o. male with a known history of diabetes, hypertension, Parkinson's disease, TIA, chronic a. Fib, chronic systolic CHF who presented to the hospital with dyspnea and wheezing. On arrival to ER he was noted to have normal BNP, his chest xray showed vascular congestion, cardiomegaly, no infiltrates. The patient initially required BIPAP due to hypoxia . He was initiated on antibiotics, steroids, inhalation therapy, nebulizers and improved. He was weaned down to his usual O2 per [place] level and his O2 saturation remained stable at rest and on exertion, he was felt to be stable to be discharged home. Discussion by problem: #1 acute on chronic hypoxic respiratory failure-secondary to COPD exacerbation. -Required BiPAP on admission. -weaned down to 2 L oxygen per [place], his baseline. -Continue to taper steroids at home, antibiotics for 2 more days, nebulizer treatments and inhalers -Chest x-ray without any infiltrate. Sputum cx not obtained.  #2 chronic systolic CHF, EF of 45%-chest x-ray with some, likely  chronic pulmonary congestion. -Continue Lasix at home doses. -Continue lisinopril and metoprolol, may need to be advanced,. Please follow blood pressure readings closely, as SBP was 145 in the hospital   #3 diabetes mellitus with hyperglycemia-patient on U500 insulin at home. -Appreciate diabetes coordinator input.  patient was hyperglycemic with steroids, resume home medications upon dc home  #4 chronic atrial fibrillation-rate controlled. On metoprolol -On warfarin and INR was supratherapeutic, recommended to resume coumadin and recheck protime/INR level on Monday( patient  was given only half of usual dose of coumadin today, discussed with pharmacist today) .  #5 DVT prophylaxis-on warfarin,INR 3.4 today, recheck on Monday   VITAL SIGNS:  Blood pressure 145/76, pulse 71, temperature 97.8 F (36.6 C), temperature source Oral, resp. rate 16, height 5\' 9"  (1.753 m), weight 105.098 kg (231 lb 11.2 oz), SpO2 95 %.  I/O:   Intake/Output Summary (Last 24 hours) at 09/28/15 1804 Last data filed at 09/28/15 0714  Gross per 24 hour  Intake    243 ml  Output      0 ml  Net    243 ml    PHYSICAL EXAMINATION:  GENERAL:  82 y.o.-year-old patient lying in the bed with no acute distress.  EYES: Pupils equal, round, reactive to light and accommodation. No scleral icterus. Extraocular muscles intact.  HEENT: Head atraumatic, normocephalic. Oropharynx and nasopharynx clear.  NECK:  Supple, no jugular venous distention. No thyroid enlargement, no tenderness.  LUNGS: some diminished breath sounds bilaterally, wheezing on exertion , no rales,rhonchi or crepitations. Intermittent use of accessory muscles of respiration, mostly with exertion.  CARDIOVASCULAR: S1, S2 normal. No murmurs, rubs, or gallops.  ABDOMEN: Soft, non-tender, non-distended. Bowel sounds present. No organomegaly or mass.  EXTREMITIES: No pedal edema, cyanosis, or clubbing.  NEUROLOGIC: Cranial nerves II through XII are intact. Muscle strength 5/5 in all extremities. Sensation intact. Gait not checked.  PSYCHIATRIC: The patient is alert and oriented x 3.  SKIN: No obvious rash, lesion, or ulcer.   DATA REVIEW:   CBC  Recent Labs Lab 09/25/15 0616  WBC 10.2  HGB 12.7*  HCT 38.6*  PLT 150    Chemistries   Recent Labs Lab 09/27/15 0403  NA 138  K 4.0  CL 103  CO2 29  GLUCOSE 162*  BUN 43*  CREATININE 1.17  CALCIUM 8.7*    Cardiac Enzymes  Recent Labs Lab 09/24/15 2230  TROPONINI 0.03    Microbiology Results  Results for orders placed or performed during the hospital  encounter of 09/24/15  MRSA PCR Screening     Status: None   Collection Time: 09/25/15  1:30 AM  Result Value Ref Range Status   MRSA by PCR NEGATIVE NEGATIVE Final    Comment:        The GeneXpert MRSA Assay (FDA approved for NASAL specimens only), is one component of a comprehensive MRSA colonization surveillance program. It is not intended to diagnose MRSA infection nor to guide or monitor treatment for MRSA infections.     RADIOLOGY:  No results found.  EKG:   Orders placed or performed during the hospital encounter of 09/24/15  . EKG 12-Lead  . EKG 12-Lead  . ED EKG  . ED EKG      Management plans discussed with the patient, family and they are in agreement.  CODE STATUS:     Code Status Orders        Start     Ordered   09/25/15 0119  Do not attempt resuscitation (DNR)   Continuous    Question Answer Comment  In the event of cardiac or respiratory ARREST Do not call a "code blue"   In the event of cardiac or respiratory ARREST Do not perform Intubation, CPR, defibrillation or ACLS   In the event of cardiac or respiratory ARREST Use medication by any route, position, wound care, and other measures to relive pain and suffering. May use oxygen, suction and manual treatment of airway obstruction as needed for comfort.      09/25/15 0118    Code Status History    Date Active Date Inactive Code Status Order ID Comments User Context   08/07/2015  7:50 PM 08/09/2015  3:23 PM DNR [id number]  Darrol Jump, PA-C Inpatient   08/03/2015  4:58 PM 08/07/2015  7:50 PM DNR [id number]  Houston Siren, MD ED   02/25/2015  7:56 PM 03/01/2015  7:21 PM Full Code [id number]  Gale Journey, MD ED    Advance Directive Documentation        Most Recent Value   Type of Advance Directive  Healthcare Power of Attorney, Living will   Pre-existing out of facility DNR order (yellow form or pink MOST form)     "MOST" Form in Place?        TOTAL TIME TAKING CARE OF THIS PATIENT: 40  minutes.    Katharina Caper M.D on 09/28/2015 at 6:04 PM  Between 7am to  6pm - Pager - [phone]  After 6pm go to [email or web address] - password [email or web address] [facility]  [place] [facility]  Office  [phone]  CC: Primary care physician; Barbette Reichmann, MD

## 2015-10-23 ENCOUNTER — Encounter: Payer: Self-pay | Admitting: Anesthesiology

## 2015-10-23 ENCOUNTER — Ambulatory Visit: Payer: Medicare Other | Attending: Anesthesiology | Admitting: Anesthesiology

## 2015-10-23 VITALS — BP 123/65 | HR 84 | Temp 98.3°F | Resp 17 | Ht 69.0 in | Wt 231.0 lb

## 2015-10-23 DIAGNOSIS — M4806 Spinal stenosis, lumbar region: Secondary | ICD-10-CM | POA: Insufficient documentation

## 2015-10-23 DIAGNOSIS — J449 Chronic obstructive pulmonary disease, unspecified: Secondary | ICD-10-CM | POA: Insufficient documentation

## 2015-10-23 DIAGNOSIS — N183 Chronic kidney disease, stage 3 (moderate): Secondary | ICD-10-CM | POA: Diagnosis not present

## 2015-10-23 DIAGNOSIS — G20 Parkinson's disease: Secondary | ICD-10-CM | POA: Diagnosis not present

## 2015-10-23 DIAGNOSIS — I341 Nonrheumatic mitral (valve) prolapse: Secondary | ICD-10-CM | POA: Diagnosis not present

## 2015-10-23 DIAGNOSIS — I129 Hypertensive chronic kidney disease with stage 1 through stage 4 chronic kidney disease, or unspecified chronic kidney disease: Secondary | ICD-10-CM | POA: Insufficient documentation

## 2015-10-23 DIAGNOSIS — M549 Dorsalgia, unspecified: Secondary | ICD-10-CM | POA: Diagnosis present

## 2015-10-23 DIAGNOSIS — I482 Chronic atrial fibrillation: Secondary | ICD-10-CM | POA: Diagnosis not present

## 2015-10-23 DIAGNOSIS — E039 Hypothyroidism, unspecified: Secondary | ICD-10-CM | POA: Insufficient documentation

## 2015-10-23 DIAGNOSIS — I10 Essential (primary) hypertension: Secondary | ICD-10-CM | POA: Diagnosis not present

## 2015-10-23 DIAGNOSIS — E1122 Type 2 diabetes mellitus with diabetic chronic kidney disease: Secondary | ICD-10-CM | POA: Insufficient documentation

## 2015-10-23 DIAGNOSIS — G459 Transient cerebral ischemic attack, unspecified: Secondary | ICD-10-CM | POA: Diagnosis not present

## 2015-10-23 DIAGNOSIS — N390 Urinary tract infection, site not specified: Secondary | ICD-10-CM | POA: Diagnosis not present

## 2015-10-23 DIAGNOSIS — M48061 Spinal stenosis, lumbar region without neurogenic claudication: Secondary | ICD-10-CM

## 2015-10-23 DIAGNOSIS — I5022 Chronic systolic (congestive) heart failure: Secondary | ICD-10-CM | POA: Insufficient documentation

## 2015-10-23 DIAGNOSIS — Z87891 Personal history of nicotine dependence: Secondary | ICD-10-CM | POA: Diagnosis not present

## 2015-10-23 DIAGNOSIS — M5136 Other intervertebral disc degeneration, lumbar region: Secondary | ICD-10-CM | POA: Diagnosis not present

## 2015-10-23 NOTE — Patient Instructions (Signed)
We will call after approval from Dr. Marcello Fennel and inform when to stop Coumadin. If approved will be 5 days prior to procedure.       Epidural Steroid Injection Patient Information  Description: The epidural space surrounds the nerves as they exit the spinal cord.  In some patients, the nerves can be compressed and inflamed by a bulging disc or a tight spinal canal (spinal stenosis).  By injecting steroids into the epidural space, we can bring irritated nerves into direct contact with a potentially helpful medication.  These steroids act directly on the irritated nerves and can reduce swelling and inflammation which often leads to decreased pain.  Epidural steroids may be injected anywhere along the spine and from the neck to the low back depending upon the location of your pain.   After numbing the skin with local anesthetic (like Novocaine), a small needle is passed into the epidural space slowly.  You may experience a sensation of pressure while this is being done.  The entire block usually last less than 10 minutes.  Conditions which may be treated by epidural steroids:   Low back and leg pain  Neck and arm pain  Spinal stenosis  Post-laminectomy syndrome  Herpes zoster (shingles) pain  Pain from compression fractures  Preparation for the injection:  1. Do not eat any solid food or dairy products within 8 hours of your appointment.  2. You may drink clear liquids up to 3 hours before appointment.  Clear liquids include water, black coffee, juice or soda.  No milk or cream please. 3. You may take your regular medication, including pain medications, with a sip of water before your appointment  Diabetics should hold regular insulin (if taken separately) and take 1/2 normal NPH dos the morning of the procedure.  Carry some sugar containing items with you to your appointment. 4. A driver must accompany you and be prepared to drive you home after your procedure.  5. Bring all your  current medications with your. 6. An IV may be inserted and sedation may be given at the discretion of the physician.   7. A blood pressure cuff, EKG and other monitors will often be applied during the procedure.  Some patients may need to have extra oxygen administered for a short period. 8. You will be asked to provide medical information, including your allergies, prior to the procedure.  We must know immediately if you are taking blood thinners (like Coumadin/Warfarin)  Or if you are allergic to IV iodine contrast (dye). We must know if you could possible be pregnant.  Possible side-effects:  Bleeding from needle site  Infection (rare, may require surgery)  Nerve injury (rare)  Numbness & tingling (temporary)  Difficulty urinating (rare, temporary)  Spinal headache ( a headache worse with upright posture)  Light -headedness (temporary)  Pain at injection site (several days)  Decreased blood pressure (temporary)  Weakness in arm/leg (temporary)  Pressure sensation in back/neck (temporary)  Call if you experience:  Fever/chills associated with headache or increased back/neck pain.  Headache worsened by an upright position.  New onset weakness or numbness of an extremity below the injection site  Hives or difficulty breathing (go to the emergency room)  Inflammation or drainage at the infection site  Severe back/neck pain  Any new symptoms which are concerning to you  Please note:  Although the local anesthetic injected can often make your back or neck feel good for several hours after the injection, the pain will likely  return.  It takes 3-7 days for steroids to work in the epidural space.  You may not notice any pain relief for at least that one week.  If effective, we will often do a series of three injections spaced 3-6 weeks apart to maximally decrease your pain.  After the initial series, we generally will wait several months before considering a repeat  injection of the same type.  If you have any questions, please call [phone] [facility]

## 2015-10-25 NOTE — Progress Notes (Signed)
Subjective:  Patient ID: Troy [name], male    DOB: Feb 05, 1924  Age: 82 y.o. MRN: [id number]  CC: Back Pain   Service Provided on Last Visit: Evaluation  PROCEDURE:None  HPI Troy [name] presents for a new patient evaluation. I have known Troy [name] from past experience inThe pain [name] and he was seen a few years ago. Troy quality characteristic and distribution of his low back pain are Troy same as per baseline. He generally describes a midline gnawing aching pain with radiation into Troy bilateral hip and buttock region. Rarely does he have radiating pain into Troy lower extremities calf pain or cramping. His strength is been well preserved to Troy lower extremities and otherwise has been in his usual state of health. He does use intermittent medication for low back pain relief. He also admits to being on Coumadin or his A. fib. Otherwise Troy quality and characteristic of his pain have been stable  History Troy [name] has a past medical history of Diabetes mellitus without complication (HCC); Hypertension; UTI (lower urinary tract infection); Parkinson's disease (HCC); TIA (transient ischemic attack); Chronic atrial fibrillation (HCC); Chronic systolic CHF (congestive heart failure) (HCC); Stroke [facility]); Hypothyroidism; Normal coronary arteries; Mitral valve prolapse; CKD (chronic kidney disease), stage III; and COPD (chronic obstructive pulmonary disease) (HCC).   He has past surgical history that includes Cardiac surgery; Cholecystectomy; Cardiac catheterization (N/A, 08/08/2015); and Cardiac catheterization (N/A, 08/08/2015).   His family history includes CAD in his father; Hypertension in his mother; Lung disease in his brother; Lymphoma in his sister; Stroke in his mother.He reports that he has quit smoking. His smoking use included Cigarettes. He has a 80 pack-year smoking history. He has never used smokeless tobacco. He reports that he does not drink alcohol or use illicit drugs.  No  results found for this or any previous visit.  No results found for: TOXASSSELUR  Outpatient Prescriptions Prior to Visit  Medication Sig Dispense Refill  . albuterol (PROVENTIL HFA) 108 (90 Base) MCG/ACT inhaler Inhale 2 puffs into Troy lungs every 4 (four) hours as needed for wheezing or shortness of breath. 1 Inhaler 0  . albuterol (PROVENTIL) (2.5 MG/3ML) 0.083% nebulizer solution Take 2.5 mg by nebulization every 6 (six) hours as needed for wheezing or shortness of breath.    . carbidopa-levodopa (SINEMET IR) 25-250 MG tablet Take 1 tablet by mouth 3 (three) times daily.    . cetirizine (ZYRTEC) 10 MG tablet Take 10 mg by mouth daily.    . furosemide (LASIX) 20 MG tablet Take 20 mg by mouth every evening.    . furosemide (LASIX) 40 MG tablet Take 40 mg by mouth every morning.     [name] glipiZIDE (GLUCOTROL) 10 MG tablet Take 5-10 mg by mouth 2 (two) times daily. Pt takes one tablet in Troy morning and one-half tablet in Troy evening.    . insulin regular human CONCENTRATED (HUMULIN R) 500 UNIT/ML injection Inject 8 Units into Troy skin 2 (two) times daily with a meal. Pt uses four units with breakfast and six units with dinner.    [name] lisinopril (PRINIVIL,ZESTRIL) 40 MG tablet Take 40 mg by mouth at bedtime.     . metoprolol succinate (TOPROL-XL) 25 MG 24 hr tablet Take 1 tablet (25 mg total) by mouth every evening. Take with or immediately following a meal. 30 tablet 0  . predniSONE (STERAPRED UNI-PAK 21 TAB) 10 MG (21) TBPK tablet Take 1 tablet (10 mg total) by mouth daily. Please take 6  pills in Troy morning on Troy day 1 and 2, then taper by one pill every 2 days until finished. Thank you 42 tablet 0  . saxagliptin HCl (ONGLYZA) 2.5 MG TABS tablet Take 5 mg by mouth every evening.    Troy Pauls. Tetrahydroz-Dextran-PEG-Povid (EYE DROPS ADVANCED RELIEF) 0.05-0.1-1-1 % SOLN Place 1 drop into both eyes daily.    [name]. warfarin (COUMADIN) 3 MG tablet Take 3 mg by mouth every evening. Pt takes on Tuesday, Thursday, and  Saturday.    [name]. azithromycin (ZITHROMAX) 250 MG tablet Take 1 tablet (250 mg total) by mouth daily. (Patient not taking: Reported on 10/23/2015) 2 each 0  . Multiple Vitamins-Minerals (PRESERVISION AREDS 2) CAPS Take 1 capsule by mouth 2 (two) times daily. Reported on 10/23/2015    . warfarin (COUMADIN) 6 MG tablet Take 5 mg by mouth every evening. Pt takes on Monday, Wednesday, Friday, and Sunday.     No facility-administered medications prior to visit.   Lab Results  Component Value Date   WBC 10.2 09/25/2015   HGB 12.7* 09/25/2015   HCT 38.6* 09/25/2015   PLT 150 09/25/2015   GLUCOSE 162* 09/27/2015   ALT 18 08/08/2015   AST 33 08/08/2015   NA 138 09/27/2015   K 4.0 09/27/2015   CL 103 09/27/2015   CREATININE 1.17 09/27/2015   BUN 43* 09/27/2015   CO2 29 09/27/2015   TSH 0.986 08/08/2015   INR 3.40 09/28/2015   HGBA1C 9.8* 02/25/2015    --------------------------------------------------------------------------------------------------------------------- No results found.     ---------------------------------------------------------------------------------------------------------------------- Past Medical History  Diagnosis Date  . Diabetes mellitus without complication (HCC)   . Hypertension   . UTI (lower urinary tract infection)   . Parkinson's disease (HCC)   . TIA (transient ischemic attack)   . Chronic atrial fibrillation (HCC)     a. on Couamdin; b. CHADS2VASc at least 7 (CHF, HTN, age x 2, DM, stroke x 2); c. s/p MAZE 1999  . Chronic systolic CHF (congestive heart failure) (HCC)     a. echo 08/01/2015: EF of 45%, mild LVH, mitral valve with annular calcification, mitral valve partially mobile  . Stroke (HCC)   . Hypothyroidism   . Normal coronary arteries     a. by cardiac cath 1999  . Mitral valve prolapse     a. s/p mitral valve repair 1999  . CKD (chronic kidney disease), stage III   . COPD (chronic obstructive pulmonary disease) [facility](HCC)     Past Surgical  History  Procedure Laterality Date  . Cardiac surgery    . Cholecystectomy    . Cardiac catheterization N/A 08/08/2015    Procedure: Temporary Pacemaker;  Surgeon: Peter M SwazilandJordan, MD;  [facility];  Service: Cardiovascular;  Laterality: N/A;  . Ep implantable device N/A 08/08/2015    Procedure: Pacemaker Implant;  Surgeon: Will Jorja LoaMartin Camnitz, MD;  Location: [facility] INVASIVE CV LAB;  Service: Cardiovascular;  Laterality: N/A;    Family History  Problem Relation Age of Onset  . Hypertension Mother   . Stroke Mother   . CAD Father   . Lymphoma Sister   . Lung disease Brother     Social History  Substance Use Topics  . Smoking status: Former Smoker -- 4.00 packs/day for 20 years    Types: Cigarettes  . Smokeless tobacco: Never Used  . Alcohol Use: No    ---------------------------------------------------------------------------------------------------------------------- Social History   Social History  . Marital Status: Married    Spouse Name: N/A  .  Number of Children: N/A  . Years of Education: N/A   Social History Main Topics  . Smoking status: Former Smoker -- 4.00 packs/day for 20 years    Types: Cigarettes  . Smokeless tobacco: Never Used  . Alcohol Use: No  . Drug Use: No  . Sexual Activity: Not Asked   Other Topics Concern  . None   Social History Narrative    Scheduled Meds: Continuous Infusions: PRN Meds:.   BP 123/65 mmHg  Pulse 84  Temp(Src) 98.3 F (36.8 C) (Oral)  Resp 17  Ht 5\' 9"  (1.753 m)  Wt 231 lb (104.781 kg)  BMI 34.10 kg/m2  SpO2 93%   BP Readings from Last 3 Encounters:  10/23/15 123/65  09/28/15 145/76  09/12/15 136/59     Wt Readings from Last 3 Encounters:  10/23/15 231 lb (104.781 kg)  09/28/15 231 lb 11.2 oz (105.098 kg)  09/12/15 230 lb (104.327 kg)     ----------------------------------------------------------------------------------------------------------------------  ROS Review of Systems as per  Troy nursing assessment sheet  Objective:  BP 123/65 mmHg  Pulse 84  Temp(Src) 98.3 F (36.8 C) (Oral)  Resp 17  Ht 5\' 9"  (1.753 m)  Wt 231 lb (104.781 kg)  BMI 34.10 kg/m2  SpO2 93%  Physical Exam patient is alert oriented cooperative compliant. He is a good historian Heart is irregular and irregular rhythm and rate Lungs are clear to also dictation with no rales or wheezing Inspection low back reveals some paraspinous muscle tenderness but no overt trigger points. His strength is at baseline compared to previous examination with good tone and muscle bulk.     Assessment & Plan:   Troy [name] was seen today for back pain.  Diagnoses and all orders for this visit:  Spinal stenosis of lumbar region -     LUMBAR EPIDURAL STEROID INJECTION; Future  DDD (degenerative disc disease), lumbar -     LUMBAR EPIDURAL STEROID INJECTION; Future     ----------------------------------------------------------------------------------------------------------------------  Problem List Items Addressed This Visit    None    Visit Diagnoses    Spinal stenosis of lumbar region    -  Primary    Relevant Orders    LUMBAR EPIDURAL STEROID INJECTION    DDD (degenerative disc disease), lumbar        Relevant Orders    LUMBAR EPIDURAL STEROID INJECTION       ----------------------------------------------------------------------------------------------------------------------  1. Spinal stenosis of lumbar region We will plan on an epidural for him at his next available date. Risks and benefits of an reviewed with him in full detail all questions answered. - LUMBAR EPIDURAL STEROID INJECTION; Future  2. DDD (degenerative disc disease), lumbar  - LUMBAR EPIDURAL STEROID INJECTION; Future    ----------------------------------------------------------------------------------------------------------------------  I am having [name] maintain his lisinopril, warfarin, carbidopa-levodopa,  glipiZIDE, cetirizine, insulin regular human CONCENTRATED, warfarin, PRESERVISION AREDS 2, albuterol, furosemide, Tetrahydroz-Dextran-PEG-Povid, metoprolol succinate, albuterol, furosemide, saxagliptin HCl, azithromycin, and predniSONE.   No orders of Troy defined types were placed in this encounter.       Follow-up: Return in about 2 weeks (around 11/06/2015) for procedure.    Yevette EdwardsJames G Lyndel Dancel, MD  This dictation was performed utilizing Dragon voice recognition software.  Please excuse any unintentional or mistaken typographical errors as a result of its unedited utilization.

## 2015-10-26 ENCOUNTER — Ambulatory Visit
Admission: RE | Admit: 2015-10-26 | Discharge: 2015-10-26 | Disposition: A | Discharge status: Home / Self Care | Payer: Medicare Other | Source: Ambulatory Visit | Attending: Physician Assistant | Admitting: Physician Assistant

## 2015-10-26 ENCOUNTER — Other Ambulatory Visit: Payer: Self-pay | Admitting: Physician Assistant

## 2015-10-26 DIAGNOSIS — R600 Localized edema: Secondary | ICD-10-CM | POA: Diagnosis not present

## 2015-11-13 ENCOUNTER — Encounter: Payer: Self-pay | Admitting: Cardiology

## 2015-11-13 ENCOUNTER — Ambulatory Visit (INDEPENDENT_AMBULATORY_CARE_PROVIDER_SITE_OTHER): Payer: Medicare Other | Admitting: Cardiology

## 2015-11-13 VITALS — BP 122/60 | HR 95 | Ht 69.0 in | Wt 240.0 lb

## 2015-11-13 DIAGNOSIS — I495 Sick sinus syndrome: Secondary | ICD-10-CM | POA: Diagnosis not present

## 2015-11-13 LAB — CUP PACEART INCLINIC DEVICE CHECK
Brady Statistic RV Percent Paced: 19.86 %
Implantable Lead Implant Date: 20170405
Lead Channel Impedance Value: 456 Ohm
Lead Channel Pacing Threshold Amplitude: 0.625 V
Lead Channel Sensing Intrinsic Amplitude: 11.625 mV
Lead Channel Setting Pacing Amplitude: 2.5 V
Lead Channel Setting Pacing Pulse Width: 0.4 ms
MDC IDC LEAD LOCATION: 753860
MDC IDC MSMT BATTERY REMAINING LONGEVITY: 140 mo
MDC IDC MSMT BATTERY VOLTAGE: 3.06 V
MDC IDC MSMT LEADCHNL RV IMPEDANCE VALUE: 570 Ohm
MDC IDC MSMT LEADCHNL RV PACING THRESHOLD PULSEWIDTH: 0.4 ms
MDC IDC SESS DTM: 20170711113705
MDC IDC SET LEADCHNL RV SENSING SENSITIVITY: 2.8 mV

## 2015-11-13 NOTE — Patient Instructions (Signed)
Medication Instructions:  Your physician recommends that you continue on your current medications as directed. Please refer to the Current Medication list given to you today.  Labwork: None ordered  Testing/Procedures: None ordered  Follow-Up: Remote monitoring is used to monitor your Pacemaker of ICD from home. This monitoring reduces the number of office visits required to check your device to one time per year. It allows us to keep an eye on the functioning of your device to ensure it is working properly. You are scheduled for a device check from home on 02/12/2016. You may send your transmission at any time that day. If you have a wireless device, the transmission will be sent automatically. After your physician reviews your transmission, you will receive a postcard with your next transmission date.  Your physician wants you to follow-up in: 10 months with Dr. Graciela [place]Klein [place]. You will receive a reminder letter in the mail two months in advance. If you don't receive a letter, please call our office to schedule the follow-up appointment.  If you need a refill on your cardiac medications before your next appointment, please call your pharmacy.  Thank you for choosing [facility]!!   Dory HornSherri Price, RN [phone]

## 2015-11-13 NOTE — Progress Notes (Signed)
Electrophysiology Office Note   Date:  11/13/2015   ID:  Troy BurnRobert S Sahlin, DOB 1923-12-09, MRN [id number]  PCP:  Barbette ReichmannHANDE,VISHWANATH, MD  Primary Electrophysiologist:  Regan LemmingWill Martin Ceriah Kohler, MD    Chief Complaint  Patient presents with  . Pacemaker Check    3 months post implant     History of Present Illness: Troy BurnRobert [name] is a 82 y.o. male who presents today for electrophysiology evaluation.   Hx DM, HTN, Parkinson's disease, TIA, AF on coumadin, CVA, COPD, CKD III.  [facility] with syncope and found to have asystole.  [facility] and had MDT single chamber pacemaker placed 08/07/15.   Today, he denies symptoms of palpitations, chest pain,  lower extremity edema, claudication, dizziness, presyncope, syncope, bleeding, or neurologic sequela. The patient is tolerating medications without difficulties and is otherwise without complaint today.  He is having significant wheezing today. He is recently called his primary physician and has been put on prednisone. Otherwise he feels well without major complaint.   Past Medical History  Diagnosis Date  . Diabetes mellitus without complication (HCC)   . Hypertension   . UTI (lower urinary tract infection)   . Parkinson's disease (HCC)   . TIA (transient ischemic attack)   . Chronic atrial fibrillation (HCC)     a. on Couamdin; b. CHADS2VASc at least 7 (CHF, HTN, age x 2, DM, stroke x 2); c. s/p MAZE 1999  . Chronic systolic CHF (congestive heart failure) (HCC)     a. echo 08/01/2015: EF of 45%, mild LVH, mitral valve with annular calcification, mitral valve partially mobile  . Stroke (HCC)   . Hypothyroidism   . Normal coronary arteries     a. by cardiac cath 1999  . Mitral valve prolapse     a. s/p mitral valve repair 1999  . CKD (chronic kidney disease), stage III   . COPD (chronic obstructive pulmonary disease) [facility](HCC)    Past Surgical History  Procedure Laterality Date  . Cardiac surgery    .  Cholecystectomy    . Cardiac catheterization N/A 08/08/2015    Procedure: Temporary Pacemaker;  Surgeon: Peter M SwazilandJordan, MD;  [facility];  Service: Cardiovascular;  Laterality: N/A;  . Ep implantable device N/A 08/08/2015    Procedure: Pacemaker Implant;  Surgeon: Adyson Vanburen Jorja LoaMartin Nija Koopman, MD;  Location: [facility] INVASIVE CV LAB;  Service: Cardiovascular;  Laterality: N/A;     Current Outpatient Prescriptions  Medication Sig Dispense Refill  . albuterol (PROVENTIL HFA) 108 (90 Base) MCG/ACT inhaler Inhale 2 puffs into the lungs every 4 (four) hours as needed for wheezing or shortness of breath. 1 Inhaler 0  . albuterol (PROVENTIL) (2.5 MG/3ML) 0.083% nebulizer solution Take 2.5 mg by nebulization every 6 (six) hours as needed for wheezing or shortness of breath.    . carbidopa-levodopa (SINEMET IR) 25-250 MG tablet Take 1 tablet by mouth 3 (three) times daily.    . cefUROXime (CEFTIN) 250 MG tablet Take 250 mg by mouth 2 (two) times daily.    . cetirizine (ZYRTEC) 10 MG tablet Take 10 mg by mouth daily.    . furosemide (LASIX) 40 MG tablet Take 40 mg by mouth every morning.     . furosemide (LASIX) 40 MG tablet Take 40 mg by mouth 2 (two) times daily.    [name]. glipiZIDE (GLUCOTROL) 10 MG tablet Take 5-10 mg by mouth 2 (two) times daily. Pt takes one tablet in the morning and one-half  tablet in the evening.    . insulin regular human CONCENTRATED (HUMULIN R) 500 UNIT/ML injection Inject into the skin 2 (two) times daily with a meal. 12 units in to skin in the morning and 8 units into skin in the evenings    . lisinopril (PRINIVIL,ZESTRIL) 40 MG tablet Take 40 mg by mouth at bedtime.     . metoprolol succinate (TOPROL-XL) 25 MG 24 hr tablet Take 1 tablet (25 mg total) by mouth every evening. Take with or immediately following a meal. 30 tablet 0  . Multiple Vitamins-Minerals (PRESERVISION AREDS 2) CAPS Take 1 capsule by mouth 2 (two) times daily. Reported on 10/23/2015    . predniSONE (STERAPRED  UNI-PAK 21 TAB) 10 MG (21) TBPK tablet Take 1 tablet (10 mg total) by mouth daily. Please take 6 pills in the morning on the day 1 and 2, then taper by one pill every 2 days until finished. Thank you (Patient taking differently: Take 5 mg by mouth daily. Please take 6 pills in the morning on the day 1 and 2, then taper by one pill every 2 days until finished. Thank you) 42 tablet 0  . saxagliptin HCl (ONGLYZA) 2.5 MG TABS tablet Take 5 mg by mouth every evening.    [name] (EYE DROPS ADVANCED RELIEF) 0.05-0.1-1-1 % SOLN Place 1 drop into both eyes daily.    [name] warfarin (COUMADIN) 3 MG tablet Take 3 mg by mouth every evening. Pt takes on Tuesday, Thursday, and Saturday.    . warfarin (COUMADIN) 6 MG tablet Take 5 mg by mouth every evening. Pt takes on Monday, Wednesday, Friday, and Sunday.     No current facility-administered medications for this visit.    Allergies:   Review of patient's allergies indicates no known allergies.   Social History:  The patient  reports that he has quit smoking. His smoking use included Cigarettes. He has a 80 pack-year smoking history. He has never used smokeless tobacco. He reports that he does not drink alcohol or use illicit drugs.   Family History:  The patient's family history includes CAD in his father; Hypertension in his mother; Lung disease in his brother; Lymphoma in his sister; Stroke in his mother.    ROS:  Please see the history of present illness.   Otherwise, review of systems is positive for Fatigue, wheezing, leg swelling, dyspnea on exertion, shortness of breath at night, cough, back pain, balance problems, easy bruising.   All other systems are reviewed and negative.    PHYSICAL EXAM: VS:  BP 122/60 mmHg  Pulse 95  Ht  (1.753 m)  Wt 240 lb (108.863 kg)  BMI 35.43 kg/m2 , BMI Body mass index is 35.43 kg/(m^2). GEN: Well nourished, well developed, in no acute distress HEENT: normal Neck: no JVD, carotid bruits, or  masses Cardiac: irregular,  no murmurs, rubs, or gallops,no edema  Respiratory:  Wheezing bilaterally, normal work of breathing GI: soft, nontender, nondistended, + BS MS: no deformity or atrophy Skin: warm and dry, device site well healed Neuro:  Strength and sensation are intact Psych: euthymic mood, full affect  EKG:  EKG is ordered today. The ekg ordered today shows RBBB, Atrial fibrillation, atrial fibrillation andrate 95   Device interrogation is reviewed today in detail.  See PaceArt for details.   Recent Labs: 08/08/2015: ALT 18; Magnesium 2.2; TSH 0.986 09/24/2015: B Natriuretic Peptide 38.0 09/25/2015: Hemoglobin 12.7*; Platelets 150 09/27/2015: BUN 43*; Creatinine, Ser 1.17; Potassium 4.0; Sodium 138  Lipid Panel  No results found for: CHOL, TRIG, HDL, CHOLHDL, VLDL, LDLCALC, LDLDIRECT   Wt Readings from Last 3 Encounters:  11/13/15 240 lb (108.863 kg)  10/23/15 231 lb (104.781 kg)  09/28/15 231 lb 11.2 oz (105.098 kg)      Other studies Reviewed: Additional studies/ records that were reviewed today include: TTE 09/28/15  Review of the above records today demonstrates:  - Procedure narrative: Transthoracic echocardiography. The study  was technically difficult. - Left ventricle: The cavity size was normal. Systolic function was  normal. The estimated ejection fraction was in the range of 50%  to 55%. Wall motion was normal; there were no regional wall  motion abnormalities. The study is not technically sufficient to  allow evaluation of LV diastolic function. - Left atrium: The atrium was moderately dilated. - Right ventricle: Pacer wire or catheter noted in right ventricle.  Systolic function was low normal. - Pulmonary arteries: Systolic pressure was moderately elevated. PA  peak pressure: 53 mm Hg (S). - Pericardium, extracardiac: A trivial pericardial effusion was  identified.   ASSESSMENT AND PLAN:  1.  Atrial fibrillation: On coumadin.  Chronically in atrial fibrillation. Feeling well today without major complaints. With his wheezing today, I Lynee Rosenbach make no changes to his medications. If he does continue wheezing, he may benefit from a calcium channel blocker instead of a beta blocker.  This patients CHA2DS2-VASc Score and unadjusted Ischemic Stroke Rate (% per year) is equal to 9.7 % stroke rate/year from a score of 6  Above score calculated as 1 point each if present [CHF, HTN, DM, Vascular=MI/PAD/Aortic Plaque, Age if 65-74, or Male] Above score calculated as 2 points each if present [Age > 75, or Stroke/TIA/TE]   2. Asystole: s/p single lead pacemaker. Functioning appropriately without any changes necessary today.    Current medicines are reviewed at length with the patient today.   The patient does not have concerns regarding his medicines.  The following changes were made today:  none  Labs/ tests ordered today include:  No orders of the defined types were placed in this encounter.     Disposition:   FU with Berton Mount 9 months  Signed, Shivaay Stormont Jorja Loa, MD  11/13/2015 10:45 AM     [name] [address] [place] [address] [phone] (office) [phone] (fax)

## 2015-11-14 ENCOUNTER — Encounter: Payer: Self-pay | Admitting: Anesthesiology

## 2015-11-14 ENCOUNTER — Ambulatory Visit: Payer: Medicare Other | Attending: Anesthesiology | Admitting: Anesthesiology

## 2015-11-14 VITALS — BP 147/88 | HR 82 | Temp 98.1°F | Resp 17 | Ht 69.0 in | Wt 230.0 lb

## 2015-11-14 DIAGNOSIS — I130 Hypertensive heart and chronic kidney disease with heart failure and stage 1 through stage 4 chronic kidney disease, or unspecified chronic kidney disease: Secondary | ICD-10-CM | POA: Insufficient documentation

## 2015-11-14 DIAGNOSIS — I482 Chronic atrial fibrillation: Secondary | ICD-10-CM | POA: Insufficient documentation

## 2015-11-14 DIAGNOSIS — G20 Parkinson's disease: Secondary | ICD-10-CM | POA: Diagnosis not present

## 2015-11-14 DIAGNOSIS — I5022 Chronic systolic (congestive) heart failure: Secondary | ICD-10-CM | POA: Diagnosis not present

## 2015-11-14 DIAGNOSIS — Z9889 Other specified postprocedural states: Secondary | ICD-10-CM | POA: Diagnosis not present

## 2015-11-14 DIAGNOSIS — M5136 Other intervertebral disc degeneration, lumbar region: Secondary | ICD-10-CM | POA: Diagnosis not present

## 2015-11-14 DIAGNOSIS — Z8249 Family history of ischemic heart disease and other diseases of the circulatory system: Secondary | ICD-10-CM | POA: Diagnosis not present

## 2015-11-14 DIAGNOSIS — E039 Hypothyroidism, unspecified: Secondary | ICD-10-CM | POA: Diagnosis not present

## 2015-11-14 DIAGNOSIS — I10 Essential (primary) hypertension: Secondary | ICD-10-CM | POA: Diagnosis not present

## 2015-11-14 DIAGNOSIS — E119 Type 2 diabetes mellitus without complications: Secondary | ICD-10-CM | POA: Diagnosis not present

## 2015-11-14 DIAGNOSIS — E1122 Type 2 diabetes mellitus with diabetic chronic kidney disease: Secondary | ICD-10-CM | POA: Diagnosis not present

## 2015-11-14 DIAGNOSIS — J449 Chronic obstructive pulmonary disease, unspecified: Secondary | ICD-10-CM | POA: Insufficient documentation

## 2015-11-14 DIAGNOSIS — M4806 Spinal stenosis, lumbar region: Secondary | ICD-10-CM | POA: Diagnosis not present

## 2015-11-14 DIAGNOSIS — I341 Nonrheumatic mitral (valve) prolapse: Secondary | ICD-10-CM | POA: Diagnosis not present

## 2015-11-14 DIAGNOSIS — Z7901 Long term (current) use of anticoagulants: Secondary | ICD-10-CM | POA: Insufficient documentation

## 2015-11-14 DIAGNOSIS — M48061 Spinal stenosis, lumbar region without neurogenic claudication: Secondary | ICD-10-CM

## 2015-11-14 DIAGNOSIS — M549 Dorsalgia, unspecified: Secondary | ICD-10-CM | POA: Diagnosis present

## 2015-11-14 DIAGNOSIS — Z87891 Personal history of nicotine dependence: Secondary | ICD-10-CM | POA: Insufficient documentation

## 2015-11-14 DIAGNOSIS — Z8673 Personal history of transient ischemic attack (TIA), and cerebral infarction without residual deficits: Secondary | ICD-10-CM | POA: Insufficient documentation

## 2015-11-14 MED ORDER — SODIUM CHLORIDE 0.9% FLUSH
10.0000 mL | Freq: Once | INTRAVENOUS | Status: AC
  Administered 2015-11-14: 10 mL

## 2015-11-14 MED ORDER — LACTATED RINGERS IV SOLN: 1000.0000 mL | INTRAVENOUS | Status: DC

## 2015-11-14 MED ORDER — MIDAZOLAM HCL 5 MG/5ML IJ SOLN
5.0000 mg | Freq: Once | INTRAMUSCULAR | Status: AC
Start: 2015-11-14 — End: 2015-11-14
  Administered 2015-11-14: 1 mg via INTRAVENOUS
  Filled 2015-11-14: qty 5

## 2015-11-14 MED ORDER — IOPAMIDOL (ISOVUE-M 200) INJECTION 41%
INTRAMUSCULAR | Status: AC
  Administered 2015-11-14: 3 mL
  Filled 2015-11-14: qty 10

## 2015-11-14 MED ORDER — TRIAMCINOLONE ACETONIDE 40 MG/ML IJ SUSP
40.0000 mg | Freq: Once | INTRAMUSCULAR | Status: AC
  Administered 2015-11-14: 40 mg
  Filled 2015-11-14: qty 1

## 2015-11-14 MED ORDER — ROPIVACAINE HCL 2 MG/ML IJ SOLN
10.0000 mL | Freq: Once | INTRAMUSCULAR | Status: AC
  Administered 2015-11-14: 10 mL via EPIDURAL
  Filled 2015-11-14: qty 10

## 2015-11-14 MED ORDER — IOPAMIDOL (ISOVUE-M 200) INJECTION 41%: 20.0000 mL | Freq: Once | INTRAMUSCULAR | Status: DC | PRN

## 2015-11-14 MED ORDER — HYDROCODONE-ACETAMINOPHEN 5-325 MG PO TABS: 1.0000 | ORAL_TABLET | Freq: Four times a day (QID) | ORAL | Status: DC | PRN

## 2015-11-14 MED ORDER — LIDOCAINE HCL (PF) 1 % IJ SOLN
5.0000 mL | Freq: Once | INTRAMUSCULAR | Status: AC
  Administered 2015-11-14: 5 mL via SUBCUTANEOUS
  Filled 2015-11-14: qty 5

## 2015-11-14 NOTE — Progress Notes (Signed)
Safety precautions to be maintained throughout the outpatient stay will include: orient to surroundings, keep bed in low position, maintain call bell within reach at all times, provide assistance with transfer out of bed and ambulation.  

## 2015-11-14 NOTE — Patient Instructions (Signed)
Resume taking your Coumadin tomorrow. Pain Management Discharge Instructions  General Discharge Instructions :  If you need to reach your doctor call: Monday-Friday 8:00 am - 4:00 pm at [phone] or toll free [phone].  After clinic hours [phone] to have operator reach doctor.  Bring all of your medication bottles to all your appointments in the pain clinic.  To cancel or reschedule your appointment with Pain Management please remember to call 24 hours in advance to avoid a fee.  Refer to the educational materials which you have been given on: General Risks, I had my Procedure. Discharge Instructions, Post Sedation.  Post Procedure Instructions:  The drugs you were given will stay in your system until tomorrow, so for the next 24 hours you should not drive, make any legal decisions or drink any alcoholic beverages.  You may eat anything you prefer, but it is better to start with liquids then soups and crackers, and gradually work up to solid foods.  Please notify your doctor immediately if you have any unusual bleeding, trouble breathing or pain that is not related to your normal pain.  Depending on the type of procedure that was done, some parts of your body may feel week and/or numb.  This usually clears up by tonight or the next day.  Walk with the use of an assistive device or accompanied by an adult for the 24 hours.  You may use ice on the affected area for the first 24 hours.  Put ice in a Ziploc bag and cover with a towel and place against area 15 minutes on 15 minutes off.  You may switch to heat after 24 hours.

## 2015-11-14 NOTE — Progress Notes (Signed)
Subjective:  Patient ID: Troy [name], male    DOB: Dec 23, 1923  Age: 82 y.o. MRN: [id number]  CC: Back Pain   Service Provided on Last Visit: Evaluation, Med Refill  PROCEDURE:L5-S1 epidural steroid No. 1 of series under fluoroscopic guidance and moderate sedation  HPI Troy [name] presents for a reevaluation today. The quality characteristic and distribution of his low back pain have been stable in nature with no changes noted from his previous evaluation. Otherwise he is in his usual state of health. He has been off his Coumadin for 5-1/2 days. We have checked his response to this discontinuation threshold in the past with previous PT evaluation and he has always normalized. Otherwise no change in lower extremity strength or function are noted at this time in his back pain is primarily centralized. His bowel and bladder function has been stable.  History Troy [name] has a past medical history of Diabetes mellitus without complication (HCC); Hypertension; UTI (lower urinary tract infection); Parkinson's disease (HCC); TIA (transient ischemic attack); Chronic atrial fibrillation (HCC); Chronic systolic CHF (congestive heart failure) (HCC); Stroke [facility]); Hypothyroidism; Normal coronary arteries; Mitral valve prolapse; CKD (chronic kidney disease), stage III; and COPD (chronic obstructive pulmonary disease) (HCC).   He has past surgical history that includes Cardiac surgery; Cholecystectomy; Cardiac catheterization (N/A, 08/08/2015); and Cardiac catheterization (N/A, 08/08/2015).   His family history includes CAD in his father; Hypertension in his mother; Lung disease in his brother; Lymphoma in his sister; Stroke in his mother.He reports that he has quit smoking. His smoking use included Cigarettes. He has a 80 pack-year smoking history. He has never used smokeless tobacco. He reports that he does not drink alcohol or use illicit drugs.  No results found for this or any previous visit.  No  results found for: TOXASSSELUR  Outpatient Prescriptions Prior to Visit  Medication Sig Dispense Refill  . albuterol (PROVENTIL HFA) 108 (90 Base) MCG/ACT inhaler Inhale 2 puffs into the lungs every 4 (four) hours as needed for wheezing or shortness of breath. 1 Inhaler 0  . albuterol (PROVENTIL) (2.5 MG/3ML) 0.083% nebulizer solution Take 2.5 mg by nebulization every 6 (six) hours as needed for wheezing or shortness of breath.    . carbidopa-levodopa (SINEMET IR) 25-250 MG tablet Take 1 tablet by mouth 3 (three) times daily.    . cefUROXime (CEFTIN) 250 MG tablet Take 250 mg by mouth 2 (two) times daily.    . cetirizine (ZYRTEC) 10 MG tablet Take 10 mg by mouth daily.    . furosemide (LASIX) 40 MG tablet Take 40 mg by mouth every morning.     . furosemide (LASIX) 40 MG tablet Take 40 mg by mouth 2 (two) times daily.    [name] glipiZIDE (GLUCOTROL) 10 MG tablet Take 5-10 mg by mouth 2 (two) times daily. Pt takes one tablet in the morning and one-half tablet in the evening.    . insulin regular human CONCENTRATED (HUMULIN R) 500 UNIT/ML injection Inject into the skin 2 (two) times daily with a meal. 12 units in to skin in the morning and 8 units into skin in the evenings    . lisinopril (PRINIVIL,ZESTRIL) 40 MG tablet Take 40 mg by mouth at bedtime.     . metoprolol succinate (TOPROL-XL) 25 MG 24 hr tablet Take 1 tablet (25 mg total) by mouth every evening. Take with or immediately following a meal. 30 tablet 0  . Multiple Vitamins-Minerals (PRESERVISION AREDS 2) CAPS Take 1 capsule by mouth 2 (two)  times daily. Reported on 10/23/2015    . predniSONE (STERAPRED UNI-PAK 21 TAB) 10 MG (21) TBPK tablet Take 1 tablet (10 mg total) by mouth daily. Please take 6 pills in the morning on the day 1 and 2, then taper by one pill every 2 days until finished. Thank you (Patient taking differently: Take 5 mg by mouth daily. Please take 6 pills in the morning on the day 1 and 2, then taper by one pill every 2 days until  finished. Thank you) 42 tablet 0  . saxagliptin HCl (ONGLYZA) 2.5 MG TABS tablet Take 5 mg by mouth every evening.    Troy [name] (EYE DROPS ADVANCED RELIEF) 0.05-0.1-1-1 % SOLN Place 1 drop into both eyes daily.    [name] warfarin (COUMADIN) 3 MG tablet Take 3 mg by mouth every evening. Pt takes on Tuesday, Thursday, and Saturday.    . warfarin (COUMADIN) 6 MG tablet Take 5 mg by mouth every evening. Pt takes on Monday, Wednesday, Friday, and Sunday.     No facility-administered medications prior to visit.   Lab Results  Component Value Date   WBC 10.2 09/25/2015   HGB 12.7* 09/25/2015   HCT 38.6* 09/25/2015   PLT 150 09/25/2015   GLUCOSE 162* 09/27/2015   ALT 18 08/08/2015   AST 33 08/08/2015   NA 138 09/27/2015   K 4.0 09/27/2015   CL 103 09/27/2015   CREATININE 1.17 09/27/2015   BUN 43* 09/27/2015   CO2 29 09/27/2015   TSH 0.986 08/08/2015   INR 3.40 09/28/2015   HGBA1C 9.8* 02/25/2015    --------------------------------------------------------------------------------------------------------------------- No results found.     ---------------------------------------------------------------------------------------------------------------------- Past Medical History  Diagnosis Date  . Diabetes mellitus without complication (HCC)   . Hypertension   . UTI (lower urinary tract infection)   . Parkinson's disease (HCC)   . TIA (transient ischemic attack)   . Chronic atrial fibrillation (HCC)     a. on Couamdin; b. CHADS2VASc at least 7 (CHF, HTN, age x 2, DM, stroke x 2); c. s/p MAZE 1999  . Chronic systolic CHF (congestive heart failure) (HCC)     a. echo 08/01/2015: EF of 45%, mild LVH, mitral valve with annular calcification, mitral valve partially mobile  . Stroke (HCC)   . Hypothyroidism   . Normal coronary arteries     a. by cardiac cath 1999  . Mitral valve prolapse     a. s/p mitral valve repair 1999  . CKD (chronic kidney disease), stage III   .  COPD (chronic obstructive pulmonary disease) [facility])     Past Surgical History  Procedure Laterality Date  . Cardiac surgery    . Cholecystectomy    . Cardiac catheterization N/A 08/08/2015    Procedure: Temporary Pacemaker;  Surgeon: Peter M Swaziland, MD;  Location: [facility] INVASIVE CV LAB;  Service: Cardiovascular;  Laterality: N/A;  . Ep implantable device N/A 08/08/2015    Procedure: Pacemaker Implant;  Surgeon: Will Jorja Loa, MD;  Location: [facility] INVASIVE CV LAB;  Service: Cardiovascular;  Laterality: N/A;    Family History  Problem Relation Age of Onset  . Hypertension Mother   . Stroke Mother   . CAD Father   . Lymphoma Sister   . Lung disease Brother     Social History  Substance Use Topics  . Smoking status: Former Smoker -- 4.00 packs/day for 20 years    Types: Cigarettes  . Smokeless tobacco: Never Used  . Alcohol Use: No    ----------------------------------------------------------------------------------------------------------------------  Social History   Social History  . Marital Status: Married    Spouse Name: N/A  . Number of Children: N/A  . Years of Education: N/A   Social History Main Topics  . Smoking status: Former Smoker -- 4.00 packs/day for 20 years    Types: Cigarettes  . Smokeless tobacco: Never Used  . Alcohol Use: No  . Drug Use: No  . Sexual Activity: Not Asked   Other Topics Concern  . None   Social History Narrative    Scheduled Meds: Continuous Infusions: PRN Meds:.   BP 108/67 mmHg  Pulse 89  Temp(Src) 98.1 F (36.7 C) (Oral)  Resp 18  Ht 5\' 9"  (1.753 m)  Wt 230 lb (104.327 kg)  BMI 33.95 kg/m2  SpO2 100%   BP Readings from Last 3 Encounters:  11/14/15 108/67  11/13/15 122/60  10/23/15 123/65     Wt Readings from Last 3 Encounters:  11/14/15 230 lb (104.327 kg)  11/13/15 240 lb (108.863 kg)  10/23/15 231 lb (104.781 kg)      ----------------------------------------------------------------------------------------------------------------------  ROS Review of Systems as per the nursing assessment sheet  Objective:  BP 108/67 mmHg  Pulse 89  Temp(Src) 98.1 F (36.7 C) (Oral)  Resp 18  Ht 5\' 9"  (1.753 m)  Wt 230 lb (104.327 kg)  BMI 33.95 kg/m2  SpO2 100%  Physical Exam patient is alert oriented cooperative compliant. He is a good historian Heart is irregular and irregular rhythm and rate Lungs are clear to also dictation with no rales or wheezing Inspection low back reveals some paraspinous muscle tenderness but no overt trigger points. His strength is at baseline compared to previous examination with good tone and muscle bulk.     Assessment & Plan:   Molly MaduroRobert was seen today for back pain.  Diagnoses and all orders for this visit:  Spinal stenosis of lumbar region -     triamcinolone acetonide (KENALOG-40) injection 40 mg; 1 mL (40 mg total) by Other route once. -     sodium chloride flush (NS) 0.9 % injection 10 mL; 10 mLs by Other route once. -     ropivacaine (PF) 2 mg/ml (0.2%) (NAROPIN) epidural 10 mL; 10 mLs by Epidural route once. -     midazolam (VERSED) 5 MG/5ML injection 5 mg; Inject 5 mLs (5 mg total) into the vein once. -     lidocaine (PF) (XYLOCAINE) 1 % injection 5 mL; Inject 5 mLs into the skin once. -     lactated ringers infusion 1,000 mL; Inject 1,000 mLs into the vein continuous. -     iopamidol (ISOVUE-M) 41 % intrathecal injection 20 mL; 20 mLs by Other route once as needed for contrast. -     LUMBAR EPIDURAL STEROID INJECTION; Future  DDD (degenerative disc disease), lumbar  Other orders -     iopamidol (ISOVUE-M) 41 % intrathecal injection;      ----------------------------------------------------------------------------------------------------------------------  Problem List Items Addressed This Visit    None    Visit Diagnoses    Spinal stenosis of lumbar  region    -  Primary    Relevant Medications    triamcinolone acetonide (KENALOG-40) injection 40 mg    sodium chloride flush (NS) 0.9 % injection 10 mL    ropivacaine (PF) 2 mg/ml (0.2%) (NAROPIN) epidural 10 mL    midazolam (VERSED) 5 MG/5ML injection 5 mg    lidocaine (PF) (XYLOCAINE) 1 % injection 5 mL    lactated ringers infusion 1,000 mL  iopamidol (ISOVUE-M) 41 % intrathecal injection 20 mL    Other Relevant Orders    LUMBAR EPIDURAL STEROID INJECTION    DDD (degenerative disc disease), lumbar        Relevant Medications    triamcinolone acetonide (KENALOG-40) injection 40 mg       ----------------------------------------------------------------------------------------------------------------------  1. Spinal stenosis of lumbar region We will plan on an epidural for Today and 4 him at at the one month mark.. Risks and benefits of an reviewed with him in full detail all questions answered. He is to restart his Coumadin this evening and discontinue it 5 days in advance of his next procedure. - LUMBAR EPIDURAL STEROID INJECTION; Future  2. DDD (degenerative disc disease), lumbar  - LUMBAR EPIDURAL STEROID INJECTION; Future    ----------------------------------------------------------------------------------------------------------------------  I am having Mr. Kurtenbach maintain his lisinopril, warfarin, carbidopa-levodopa, glipiZIDE, cetirizine, insulin regular human CONCENTRATED, warfarin, PRESERVISION AREDS 2, albuterol, furosemide, Tetrahydroz-Dextran-PEG-Povid, metoprolol succinate, albuterol, saxagliptin HCl, predniSONE, cefUROXime, and furosemide. We will continue to administer triamcinolone acetonide, sodium chloride flush, ropivacaine (PF) 2 mg/ml (0.2%), midazolam, lidocaine (PF), lactated ringers, and iopamidol.   Meds ordered this encounter  Medications  . triamcinolone acetonide (KENALOG-40) injection 40 mg    Sig:   . sodium chloride flush (NS) 0.9 % injection  10 mL    Sig:   . ropivacaine (PF) 2 mg/ml (0.2%) (NAROPIN) epidural 10 mL    Sig:   . midazolam (VERSED) 5 MG/5ML injection 5 mg    Sig:   . lidocaine (PF) (XYLOCAINE) 1 % injection 5 mL    Sig:   . lactated ringers infusion 1,000 mL    Sig:   . iopamidol (ISOVUE-M) 41 % intrathecal injection 20 mL    Sig:   . iopamidol (ISOVUE-M) 41 % intrathecal injection    Sig:     TICE, KORI: cabinet override       Follow-up: Return in about 1 month (around 12/15/2015) for evaluation, procedure.    Procedure: L5-S1 LESI with fluoroscopic guidance and moderate sedation  NOTE: The risks, benefits, and expectations of the procedure have been discussed and explained to the patient who was understanding and in agreement with suggested treatment plan. No guarantees were made.  DESCRIPTION OF PROCEDURE: Lumbar epidural steroid injection with IV Versed, EKG, blood pressure, pulse, and pulse oximetry monitoring. The procedure was performed with the patient in the prone position under fluoroscopic guidance. A local anesthetic skin wheal of 1.5% plain lidocaine was performed at the appropriate site after fluoroscopic identifictation  Using strict aseptic technique, I then advanced an 18-gauge Tuohy epidural needle in the midline via loss-of-resistance to saline technique. There was negative aspiration for heme or  CSF.  I then confirmed position with both AP and Lateral fluoroscan. At L5-S1  A total of 5 mL of Preservative-Free normal saline mixed with 40 mg of Kenalog and 1cc Ropicaine 0.2 percent was injected incrementally via the  epidurally placed needle. The needle was removed. The patient tolerated the injection well and was convalesced and discharged to home in stable condition. Should the patient have any post procedure difficulty they have been instructed on how to contact us for assistance.    Yevette Edwards, MD  This dictation was performed utilizing Dragon voice recognition software.  Please  excuse any unintentional or mistaken typographical errors as a result of its unedited utilization.

## 2015-11-15 ENCOUNTER — Telehealth: Payer: Self-pay | Admitting: *Deleted

## 2015-11-15 NOTE — Telephone Encounter (Signed)
No problems post procedure. 

## 2015-11-28 ENCOUNTER — Ambulatory Visit: Payer: Medicare Other | Admitting: Anesthesiology

## 2015-12-07 ENCOUNTER — Emergency Department: Payer: Medicare Other

## 2015-12-07 ENCOUNTER — Inpatient Hospital Stay
Admission: EM | Admit: 2015-12-07 | Discharge: 2015-12-11 | DRG: 190 | Disposition: A | Discharge status: Home / Self Care | Payer: Medicare Other | Attending: Internal Medicine | Admitting: Internal Medicine

## 2015-12-07 ENCOUNTER — Encounter: Payer: Self-pay | Admitting: Emergency Medicine

## 2015-12-07 DIAGNOSIS — H548 Legal blindness, as defined in USA: Secondary | ICD-10-CM | POA: Diagnosis present

## 2015-12-07 DIAGNOSIS — I482 Chronic atrial fibrillation: Secondary | ICD-10-CM | POA: Diagnosis present

## 2015-12-07 DIAGNOSIS — I5022 Chronic systolic (congestive) heart failure: Secondary | ICD-10-CM | POA: Diagnosis present

## 2015-12-07 DIAGNOSIS — Z87891 Personal history of nicotine dependence: Secondary | ICD-10-CM

## 2015-12-07 DIAGNOSIS — G20 Parkinson's disease: Secondary | ICD-10-CM | POA: Diagnosis present

## 2015-12-07 DIAGNOSIS — E039 Hypothyroidism, unspecified: Secondary | ICD-10-CM | POA: Diagnosis present

## 2015-12-07 DIAGNOSIS — J441 Chronic obstructive pulmonary disease with (acute) exacerbation: Secondary | ICD-10-CM | POA: Diagnosis present

## 2015-12-07 DIAGNOSIS — N183 Chronic kidney disease, stage 3 (moderate): Secondary | ICD-10-CM | POA: Diagnosis present

## 2015-12-07 DIAGNOSIS — Z452 Encounter for adjustment and management of vascular access device: Secondary | ICD-10-CM

## 2015-12-07 DIAGNOSIS — Z79899 Other long term (current) drug therapy: Secondary | ICD-10-CM | POA: Diagnosis not present

## 2015-12-07 DIAGNOSIS — Z8249 Family history of ischemic heart disease and other diseases of the circulatory system: Secondary | ICD-10-CM | POA: Diagnosis not present

## 2015-12-07 DIAGNOSIS — Z9981 Dependence on supplemental oxygen: Secondary | ICD-10-CM | POA: Diagnosis not present

## 2015-12-07 DIAGNOSIS — I130 Hypertensive heart and chronic kidney disease with heart failure and stage 1 through stage 4 chronic kidney disease, or unspecified chronic kidney disease: Secondary | ICD-10-CM | POA: Diagnosis present

## 2015-12-07 DIAGNOSIS — Z8673 Personal history of transient ischemic attack (TIA), and cerebral infarction without residual deficits: Secondary | ICD-10-CM | POA: Diagnosis not present

## 2015-12-07 DIAGNOSIS — Z7901 Long term (current) use of anticoagulants: Secondary | ICD-10-CM | POA: Diagnosis not present

## 2015-12-07 DIAGNOSIS — E11649 Type 2 diabetes mellitus with hypoglycemia without coma: Secondary | ICD-10-CM | POA: Diagnosis present

## 2015-12-07 DIAGNOSIS — R0602 Shortness of breath: Secondary | ICD-10-CM | POA: Diagnosis present

## 2015-12-07 DIAGNOSIS — Z66 Do not resuscitate: Secondary | ICD-10-CM | POA: Diagnosis present

## 2015-12-07 DIAGNOSIS — E1122 Type 2 diabetes mellitus with diabetic chronic kidney disease: Secondary | ICD-10-CM | POA: Diagnosis present

## 2015-12-07 DIAGNOSIS — J9621 Acute and chronic respiratory failure with hypoxia: Secondary | ICD-10-CM | POA: Diagnosis present

## 2015-12-07 DIAGNOSIS — R0902 Hypoxemia: Secondary | ICD-10-CM

## 2015-12-07 DIAGNOSIS — Z823 Family history of stroke: Secondary | ICD-10-CM | POA: Diagnosis not present

## 2015-12-07 DIAGNOSIS — K5900 Constipation, unspecified: Secondary | ICD-10-CM | POA: Diagnosis present

## 2015-12-07 HISTORY — DX: Legal blindness, as defined in USA: H54.8

## 2015-12-07 LAB — COMPREHENSIVE METABOLIC PANEL
ALK PHOS: 64 U/L (ref 38–126)
ALT: 8 U/L — ABNORMAL LOW (ref 17–63)
ANION GAP: 7 (ref 5–15)
AST: 20 U/L (ref 15–41)
Albumin: 3.6 g/dL (ref 3.5–5.0)
BILIRUBIN TOTAL: 0.9 mg/dL (ref 0.3–1.2)
BUN: 33 mg/dL — ABNORMAL HIGH (ref 6–20)
CALCIUM: 8.8 mg/dL — AB (ref 8.9–10.3)
CO2: 30 mmol/L (ref 22–32)
Chloride: 104 mmol/L (ref 101–111)
Creatinine, Ser: 1.18 mg/dL (ref 0.61–1.24)
GFR, EST NON AFRICAN AMERICAN: 52 mL/min — AB (ref >60)
Glucose, Bld: 67 mg/dL (ref 65–99)
Potassium: 4.1 mmol/L (ref 3.5–5.1)
SODIUM: 141 mmol/L (ref 135–145)
TOTAL PROTEIN: 6.9 g/dL (ref 6.5–8.1)

## 2015-12-07 LAB — CBC WITH DIFFERENTIAL/PLATELET
Basophils Absolute: 0.1 10*3/uL (ref 0–0.1)
Basophils Relative: 1 %
EOS ABS: 0.5 10*3/uL (ref 0–0.7)
Eosinophils Relative: 5 %
HCT: 39.2 % — ABNORMAL LOW (ref 40.0–52.0)
HEMOGLOBIN: 13.3 g/dL (ref 13.0–18.0)
LYMPHS ABS: 1.8 10*3/uL (ref 1.0–3.6)
Lymphocytes Relative: 16 %
MCH: 31.1 pg (ref 26.0–34.0)
MCHC: 34 g/dL (ref 32.0–36.0)
MCV: 91.3 fL (ref 80.0–100.0)
MONOS PCT: 10 %
Monocytes Absolute: 1.1 10*3/uL — ABNORMAL HIGH (ref 0.2–1.0)
NEUTROS PCT: 68 %
Neutro Abs: 7.9 10*3/uL — ABNORMAL HIGH (ref 1.4–6.5)
Platelets: 149 10*3/uL — ABNORMAL LOW (ref 150–440)
RBC: 4.3 MIL/uL — ABNORMAL LOW (ref 4.40–5.90)
RDW: 15.9 % — ABNORMAL HIGH (ref 11.5–14.5)
WBC: 11.5 10*3/uL — ABNORMAL HIGH (ref 3.8–10.6)

## 2015-12-07 LAB — GLUCOSE, CAPILLARY
GLUCOSE-CAPILLARY: 204 mg/dL — AB (ref 65–99)
GLUCOSE-CAPILLARY: 443 mg/dL — AB (ref 65–99)
GLUCOSE-CAPILLARY: 493 mg/dL — AB (ref 65–99)
GLUCOSE-CAPILLARY: 414 mg/dL — AB (ref 65–99)
GLUCOSE-CAPILLARY: 333 mg/dL — AB (ref 65–99)
Glucose-Capillary: 160 mg/dL — ABNORMAL HIGH (ref 65–99)
Glucose-Capillary: 345 mg/dL — ABNORMAL HIGH (ref 65–99)

## 2015-12-07 LAB — TROPONIN I: TROPONIN I: 0.03 ng/mL — AB (ref <0.03)

## 2015-12-07 LAB — BLOOD GAS, ARTERIAL
ALLENS TEST (PASS/FAIL): POSITIVE — AB
Acid-Base Excess: 3.3 mmol/L — ABNORMAL HIGH (ref 0.0–3.0)
Bicarbonate: 29.1 mEq/L — ABNORMAL HIGH (ref 21.0–28.0)
FIO2: 0.28
O2 Saturation: 92.2 %
PCO2 ART: 48 mmHg (ref 32.0–48.0)
PH ART: 7.39 (ref 7.350–7.450)
PO2 ART: 65 mmHg — AB (ref 83.0–108.0)
Patient temperature: 37

## 2015-12-07 LAB — LACTIC ACID, PLASMA
LACTIC ACID, VENOUS: 1.1 mmol/L (ref 0.5–1.9)
Lactic Acid, Venous: 1.2 mmol/L (ref 0.5–1.9)

## 2015-12-07 LAB — BRAIN NATRIURETIC PEPTIDE: B NATRIURETIC PEPTIDE 5: 47 pg/mL (ref 0.0–100.0)

## 2015-12-07 LAB — PROTIME-INR
INR: 2
Prothrombin Time: 23 seconds — ABNORMAL HIGH (ref 11.4–15.2)

## 2015-12-07 MED ORDER — IPRATROPIUM-ALBUTEROL 0.5-2.5 (3) MG/3ML IN SOLN
3.0000 mL | Freq: Once | RESPIRATORY_TRACT | Status: AC
  Administered 2015-12-07: 3 mL via RESPIRATORY_TRACT

## 2015-12-07 MED ORDER — GUAIFENESIN ER 600 MG PO TB12
600.0000 mg | ORAL_TABLET | Freq: Two times a day (BID) | ORAL | Status: DC
  Administered 2015-12-07 – 2015-12-11 (×9): 600 mg via ORAL
  Filled 2015-12-07 (×9): qty 1

## 2015-12-07 MED ORDER — ONDANSETRON HCL 4 MG PO TABS: 4.0000 mg | ORAL_TABLET | Freq: Four times a day (QID) | ORAL | Status: DC | PRN

## 2015-12-07 MED ORDER — WARFARIN SODIUM 5 MG PO TABS: 5.0000 mg | ORAL_TABLET | Freq: Every day | ORAL | Status: DC

## 2015-12-07 MED ORDER — METHYLPREDNISOLONE SODIUM SUCC 125 MG IJ SOLR
INTRAMUSCULAR | Status: AC
  Administered 2015-12-07: 125 mg via INTRAVENOUS
  Filled 2015-12-07: qty 2

## 2015-12-07 MED ORDER — ACETAMINOPHEN 650 MG RE SUPP: 650.0000 mg | Freq: Four times a day (QID) | RECTAL | Status: DC | PRN

## 2015-12-07 MED ORDER — WARFARIN SODIUM 5 MG PO TABS
5.0000 mg | ORAL_TABLET | ORAL | Status: DC
  Administered 2015-12-07: 5 mg via ORAL
  Filled 2015-12-07: qty 1

## 2015-12-07 MED ORDER — GLIPIZIDE 10 MG PO TABS: 5.0000 mg | ORAL_TABLET | Freq: Every day | ORAL | Status: DC

## 2015-12-07 MED ORDER — METHYLPREDNISOLONE SODIUM SUCC 125 MG IJ SOLR
125.0000 mg | Freq: Once | INTRAMUSCULAR | Status: AC
  Administered 2015-12-07: 125 mg via INTRAVENOUS

## 2015-12-07 MED ORDER — SENNOSIDES-DOCUSATE SODIUM 8.6-50 MG PO TABS: 1.0000 | ORAL_TABLET | Freq: Every evening | ORAL | Status: DC | PRN

## 2015-12-07 MED ORDER — METHYLPREDNISOLONE SODIUM SUCC 125 MG IJ SOLR
125.0000 mg | Freq: Four times a day (QID) | INTRAMUSCULAR | Status: DC
  Administered 2015-12-07: 125 mg via INTRAVENOUS
  Filled 2015-12-07: qty 2

## 2015-12-07 MED ORDER — WARFARIN - PHYSICIAN DOSING INPATIENT: Freq: Every day | Status: DC

## 2015-12-07 MED ORDER — LORATADINE 10 MG PO TABS
10.0000 mg | ORAL_TABLET | Freq: Every day | ORAL | Status: DC
  Administered 2015-12-07 – 2015-12-11 (×5): 10 mg via ORAL
  Filled 2015-12-07 (×5): qty 1

## 2015-12-07 MED ORDER — SODIUM CHLORIDE 0.9 % IV SOLN: 250.0000 mL | INTRAVENOUS | Status: DC | PRN

## 2015-12-07 MED ORDER — METHYLPREDNISOLONE SODIUM SUCC 125 MG IJ SOLR: 125.0000 mg | Freq: Four times a day (QID) | INTRAMUSCULAR | Status: DC

## 2015-12-07 MED ORDER — IPRATROPIUM-ALBUTEROL 0.5-2.5 (3) MG/3ML IN SOLN
3.0000 mL | Freq: Four times a day (QID) | RESPIRATORY_TRACT | Status: DC
  Administered 2015-12-07 – 2015-12-09 (×11): 3 mL via RESPIRATORY_TRACT
  Filled 2015-12-07 (×11): qty 3

## 2015-12-07 MED ORDER — WARFARIN SODIUM 5 MG PO TABS: 5.0000 mg | ORAL_TABLET | ORAL | Status: DC

## 2015-12-07 MED ORDER — INSULIN ASPART 100 UNIT/ML ~~LOC~~ SOLN
20.0000 [IU] | Freq: Once | SUBCUTANEOUS | Status: AC
  Administered 2015-12-07: 20 [IU] via SUBCUTANEOUS

## 2015-12-07 MED ORDER — LINAGLIPTIN 5 MG PO TABS
5.0000 mg | ORAL_TABLET | Freq: Every day | ORAL | Status: DC
  Administered 2015-12-07 – 2015-12-11 (×5): 5 mg via ORAL
  Filled 2015-12-07 (×5): qty 1

## 2015-12-07 MED ORDER — ONDANSETRON HCL 4 MG/2ML IJ SOLN
4.0000 mg | Freq: Four times a day (QID) | INTRAMUSCULAR | Status: DC | PRN
Start: 2015-12-07 — End: 2015-12-11

## 2015-12-07 MED ORDER — GLIPIZIDE 5 MG PO TABS: 5.0000 mg | ORAL_TABLET | Freq: Two times a day (BID) | ORAL | Status: DC

## 2015-12-07 MED ORDER — DEXTROSE 50 % IV SOLN
INTRAVENOUS | Status: AC
  Administered 2015-12-07: 25 mL
  Filled 2015-12-07: qty 50

## 2015-12-07 MED ORDER — DEXTROSE 250 MG/ML IV SOLN: 12.5000 g | Freq: Once | INTRAVENOUS | Status: DC

## 2015-12-07 MED ORDER — SODIUM CHLORIDE 0.9% FLUSH
3.0000 mL | Freq: Two times a day (BID) | INTRAVENOUS | Status: DC
  Administered 2015-12-07 – 2015-12-11 (×9): 3 mL via INTRAVENOUS

## 2015-12-07 MED ORDER — GLIPIZIDE 10 MG PO TABS
10.0000 mg | ORAL_TABLET | Freq: Every day | ORAL | Status: DC
  Administered 2015-12-07: 10 mg via ORAL
  Filled 2015-12-07: qty 1

## 2015-12-07 MED ORDER — ACETAMINOPHEN 325 MG PO TABS: 650.0000 mg | ORAL_TABLET | Freq: Four times a day (QID) | ORAL | Status: DC | PRN

## 2015-12-07 MED ORDER — METOPROLOL SUCCINATE ER 50 MG PO TB24
25.0000 mg | ORAL_TABLET | Freq: Every evening | ORAL | Status: DC
  Administered 2015-12-07 – 2015-12-10 (×4): 25 mg via ORAL
  Filled 2015-12-07 (×4): qty 1

## 2015-12-07 MED ORDER — WARFARIN - PHARMACIST DOSING INPATIENT
Freq: Every day | Status: DC
  Administered 2015-12-07 – 2015-12-09 (×3)

## 2015-12-07 MED ORDER — CEFTRIAXONE SODIUM 1 G IJ SOLR
1.0000 g | INTRAMUSCULAR | Status: DC
  Administered 2015-12-07 – 2015-12-10 (×4): 1 g via INTRAVENOUS
  Filled 2015-12-07 (×5): qty 10

## 2015-12-07 MED ORDER — INSULIN ASPART 100 UNIT/ML ~~LOC~~ SOLN
0.0000 [IU] | Freq: Every day | SUBCUTANEOUS | Status: DC
  Administered 2015-12-07 – 2015-12-08 (×2): 4 [IU] via SUBCUTANEOUS
  Administered 2015-12-09 – 2015-12-10 (×2): 3 [IU] via SUBCUTANEOUS
  Filled 2015-12-07: qty 4
  Filled 2015-12-07: qty 3
  Filled 2015-12-07: qty 4
  Filled 2015-12-07: qty 3

## 2015-12-07 MED ORDER — WARFARIN SODIUM 1 MG PO TABS
3.0000 mg | ORAL_TABLET | ORAL | Status: DC
  Administered 2015-12-08: 3 mg via ORAL
  Filled 2015-12-07: qty 3

## 2015-12-07 MED ORDER — WARFARIN SODIUM 3 MG PO TABS: 3.0000 mg | ORAL_TABLET | Freq: Every evening | ORAL | Status: DC

## 2015-12-07 MED ORDER — LISINOPRIL 20 MG PO TABS
40.0000 mg | ORAL_TABLET | Freq: Every day | ORAL | Status: DC
  Administered 2015-12-08 – 2015-12-10 (×3): 40 mg via ORAL
  Filled 2015-12-07 (×2): qty 4
  Filled 2015-12-07 (×2): qty 2

## 2015-12-07 MED ORDER — SODIUM CHLORIDE 0.9% FLUSH: 3.0000 mL | INTRAVENOUS | Status: DC | PRN

## 2015-12-07 MED ORDER — METHYLPREDNISOLONE SODIUM SUCC 125 MG IJ SOLR
60.0000 mg | Freq: Four times a day (QID) | INTRAMUSCULAR | Status: DC
  Administered 2015-12-07 – 2015-12-08 (×4): 60 mg via INTRAVENOUS
  Filled 2015-12-07 (×4): qty 2

## 2015-12-07 MED ORDER — INSULIN ASPART 100 UNIT/ML ~~LOC~~ SOLN
0.0000 [IU] | Freq: Three times a day (TID) | SUBCUTANEOUS | Status: DC
  Administered 2015-12-07: 4 [IU] via SUBCUTANEOUS
  Administered 2015-12-07 (×2): 20 [IU] via SUBCUTANEOUS
  Administered 2015-12-08: 15 [IU] via SUBCUTANEOUS
  Administered 2015-12-08 (×2): 20 [IU] via SUBCUTANEOUS
  Administered 2015-12-09: 11 [IU] via SUBCUTANEOUS
  Administered 2015-12-09: 15 [IU] via SUBCUTANEOUS
  Administered 2015-12-09: 11 [IU] via SUBCUTANEOUS
  Administered 2015-12-10: 20 [IU] via SUBCUTANEOUS
  Administered 2015-12-10 (×2): 15 [IU] via SUBCUTANEOUS
  Administered 2015-12-11: 11 [IU] via SUBCUTANEOUS
  Filled 2015-12-07 (×3): qty 20
  Filled 2015-12-07 (×3): qty 15
  Filled 2015-12-07: qty 20
  Filled 2015-12-07: qty 11
  Filled 2015-12-07: qty 4
  Filled 2015-12-07: qty 25
  Filled 2015-12-07: qty 7
  Filled 2015-12-07 (×2): qty 20
  Filled 2015-12-07: qty 15
  Filled 2015-12-07: qty 11

## 2015-12-07 MED ORDER — CARBIDOPA-LEVODOPA 25-250 MG PO TABS
1.0000 | ORAL_TABLET | Freq: Three times a day (TID) | ORAL | Status: DC
  Administered 2015-12-07 – 2015-12-11 (×13): 1 via ORAL
  Filled 2015-12-07 (×13): qty 1

## 2015-12-07 MED ORDER — INSULIN GLARGINE 100 UNIT/ML ~~LOC~~ SOLN
20.0000 [IU] | Freq: Every day | SUBCUTANEOUS | Status: DC
  Administered 2015-12-07 – 2015-12-10 (×4): 20 [IU] via SUBCUTANEOUS
  Filled 2015-12-07 (×4): qty 0.2

## 2015-12-07 MED ORDER — IPRATROPIUM-ALBUTEROL 0.5-2.5 (3) MG/3ML IN SOLN
RESPIRATORY_TRACT | Status: AC
  Administered 2015-12-07: 3 mL via RESPIRATORY_TRACT
  Filled 2015-12-07: qty 9

## 2015-12-07 MED ORDER — FUROSEMIDE 40 MG PO TABS
40.0000 mg | ORAL_TABLET | ORAL | Status: DC
Start: 2015-12-07 — End: 2015-12-11
  Administered 2015-12-07 – 2015-12-11 (×5): 40 mg via ORAL
  Filled 2015-12-07 (×5): qty 1

## 2015-12-07 NOTE — ED Notes (Signed)
Call to floor initiated att, hold for 6.5 min, reports to RN 5 min

## 2015-12-07 NOTE — H&P (Signed)
[facility] - [place] at [facility]   PATIENT NAME: Troy [name]    MR#:  [id number]  DATE OF BIRTH:  1923/06/08  DATE OF ADMISSION:  12/07/2015  PRIMARY CARE PHYSICIAN: Barbette Reichmann, MD   REQUESTING/REFERRING PHYSICIAN:   CHIEF COMPLAINT:   Chief Complaint  Patient presents with  . Shortness of Breath    HISTORY OF PRESENT ILLNESS: Sachit Gilman  is a 82 y.o. male with a known history of Chronic atrial fibrillation on Coumadin, systolic heart failure, chronic kidney disease stage III, diabetes mellitus, hypertension, hypothyroidism presented to the emergency room with difficulty breathing for the last couple of days. Patient has increased wheezing in both the lungs and was short of breath, he tried to nebulization treatment couple of times every day at home. The shortness of breath continued and he presented to the emergency room. Patient is on home oxygen and has history of COPD. Patient also has cough productive for yellowish phlegm but no evidence of any fever or chills. Chest x-ray of done in the emergency room did not show any pneumonia. Patient was given nebulization treatment in the emergency room was put on oxygen via nasal cannula. No history of recent travel or sick contacts at home. No history of orthopnea or proximal nocturnal dyspnea. No history of any swelling in the legs. Hospitalist service was consulted for further care of the patient.  PAST MEDICAL HISTORY:   Past Medical History:  Diagnosis Date  . Chronic atrial fibrillation (HCC)    a. on Couamdin; b. CHADS2VASc at least 7 (CHF, HTN, age x 2, DM, stroke x 2); c. s/p MAZE 1999  . Chronic systolic CHF (congestive heart failure) (HCC)    a. echo 08/01/2015: EF of 45%, mild LVH, mitral valve with annular calcification, mitral valve partially mobile  . CKD (chronic kidney disease), stage III   . COPD (chronic obstructive pulmonary disease) (HCC)   . Diabetes mellitus without complication  (HCC)   . Hypertension   . Hypothyroidism   . Mitral valve prolapse    a. s/p mitral valve repair 1999  . Normal coronary arteries    a. by cardiac cath 1999  . Parkinson's disease (HCC)   . Stroke (HCC)   . TIA (transient ischemic attack)   . UTI (lower urinary tract infection)     PAST SURGICAL HISTORY: Past Surgical History:  Procedure Laterality Date  . CARDIAC CATHETERIZATION N/A 08/08/2015   Procedure: Temporary Pacemaker;  Surgeon: Peter M Swaziland, MD;  Location: [facility] INVASIVE CV LAB;  Service: Cardiovascular;  Laterality: N/A;  . CARDIAC SURGERY    . CHOLECYSTECTOMY    . EP IMPLANTABLE DEVICE N/A 08/08/2015   Procedure: Pacemaker Implant;  Surgeon: Will Jorja Loa, MD;  Location: [facility] INVASIVE CV LAB;  Service: Cardiovascular;  Laterality: N/A;    SOCIAL HISTORY:  Social History  Substance Use Topics  . Smoking status: Former Smoker    Packs/day: 4.00    Years: 20.00    Types: Cigarettes  . Smokeless tobacco: Never Used  . Alcohol use No    FAMILY HISTORY:  Family History  Problem Relation Age of Onset  . Hypertension Mother   . Stroke Mother   . CAD Father   . Lymphoma Sister   . Lung disease Brother     DRUG ALLERGIES: No Active Allergies  REVIEW OF SYSTEMS:   CONSTITUTIONAL: No fever, has weakness.  EYES: No blurred or double vision.  EARS, NOSE, AND THROAT: No tinnitus or ear  pain.  RESPIRATORY: Has cough, has shortness of breath, has wheezing, no hemoptysis.  CARDIOVASCULAR: No chest pain, orthopnea, edema.  GASTROINTESTINAL: No nausea, vomiting, diarrhea or abdominal pain.  GENITOURINARY: No dysuria, hematuria.  ENDOCRINE: No polyuria, nocturia,  HEMATOLOGY: No anemia, easy bruising or bleeding SKIN: No rash or lesion. MUSCULOSKELETAL: No joint pain or arthritis.   NEUROLOGIC: No tingling, numbness, weakness.  PSYCHIATRY: No anxiety or depression.   MEDICATIONS AT HOME:  Prior to Admission medications   Medication Sig Start Date End Date  Taking? Authorizing Provider  albuterol (PROVENTIL HFA) 108 (90 Base) MCG/ACT inhaler Inhale 2 puffs into the lungs every 4 (four) hours as needed for wheezing or shortness of breath. 07/11/15  Yes Sharman Cheek, MD  albuterol (PROVENTIL) (2.5 MG/3ML) 0.083% nebulizer solution Take 2.5 mg by nebulization every 6 (six) hours as needed for wheezing or shortness of breath.   Yes Historical Provider, MD  carbidopa-levodopa (SINEMET IR) 25-250 MG tablet Take 1 tablet by mouth 3 (three) times daily.   Yes Historical Provider, MD  cetirizine (ZYRTEC) 10 MG tablet Take 10 mg by mouth daily.   Yes Historical Provider, MD  furosemide (LASIX) 40 MG tablet Take 40 mg by mouth every morning.    Yes Historical Provider, MD  glipiZIDE (GLUCOTROL) 10 MG tablet Take 5-10 mg by mouth 2 (two) times daily. Pt takes one tablet in the morning and one-half tablet in the evening.   Yes Historical Provider, MD  insulin regular human CONCENTRATED (HUMULIN R) 500 UNIT/ML injection Inject 4-6 Units into the skin 2 (two) times daily with a meal. 4 units in the morning and 6 units at bedtime   Yes Historical Provider, MD  lisinopril (PRINIVIL,ZESTRIL) 40 MG tablet Take 40 mg by mouth at bedtime.    Yes Historical Provider, MD  metoprolol succinate (TOPROL-XL) 25 MG 24 hr tablet Take 1 tablet (25 mg total) by mouth every evening. Take with or immediately following a meal. 08/06/15  Yes Vipul Sherryll [place], MD  predniSONE (STERAPRED UNI-PAK 21 TAB) 10 MG (21) TBPK tablet Take 1 tablet (10 mg total) by mouth daily. Please take 6 pills in the morning on the day 1 and 2, then taper by one pill every 2 days until finished. Thank you 09/28/15  Yes Katharina Caper, MD  saxagliptin HCl (ONGLYZA) 2.5 MG TABS tablet Take 5 mg by mouth every evening.   Yes Historical Provider, MD  warfarin (COUMADIN) 3 MG tablet Take 3 mg by mouth every evening. Pt takes on Tuesday, Thursday, and Saturday.   Yes Historical Provider, MD  warfarin (COUMADIN) 5 MG tablet Take  5 mg by mouth daily. Takes Monday, Wednesday, and Friday   Yes Historical Provider, MD      PHYSICAL EXAMINATION:   VITAL SIGNS: Blood pressure (!) 150/63, pulse 81, temperature 97.6 F (36.4 C), temperature source Oral, resp. rate 20, height 5\' 9"  (1.753 m), weight 104.3 kg (230 lb), SpO2 99 %.  GENERAL:  82 y.o.-year-old elderly patient lying in the bed in mild respiratory distress.  EYES: Pupils equal, round, reactive to light and accommodation. No scleral icterus. Extraocular muscles intact.  HEENT: Head atraumatic, normocephalic. Oropharynx and nasopharynx clear.  NECK:  Supple, no jugular venous distention. No thyroid enlargement, no tenderness.  LUNGS: Decreased breath sounds bilaterally, wheezing heard in both lung fields.. No use of accessory muscles of respiration.  No dullness to percussion. CARDIOVASCULAR: S1, S2 normal. No murmurs, rubs, or gallops.  ABDOMEN: Soft, obese, nontender, nondistended. Bowel sounds present.  No organomegaly or mass.  EXTREMITIES: No pedal edema, cyanosis, or clubbing.  NEUROLOGIC: Cranial nerves II through XII are intact. Muscle strength 5/5 in all extremities. Sensation intact. Gait not checked.  PSYCHIATRIC: The patient is alert and oriented x 3.  SKIN: No obvious rash, lesion, or ulcer.   LABORATORY PANEL:   CBC  Recent Labs Lab 12/07/15 0257  WBC 11.5*  HGB 13.3  HCT 39.2*  PLT 149*  MCV 91.3  MCH 31.1  MCHC 34.0  RDW 15.9*  LYMPHSABS 1.8  MONOABS 1.1*  EOSABS 0.5  BASOSABS 0.1   ------------------------------------------------------------------------------------------------------------------  Chemistries   Recent Labs Lab 12/07/15 0257  NA 141  K 4.1  CL 104  CO2 30  GLUCOSE 67  BUN 33*  CREATININE 1.18  CALCIUM 8.8*  AST 20  ALT 8*  ALKPHOS 64  BILITOT 0.9   ------------------------------------------------------------------------------------------------------------------ estimated creatinine clearance is 49.5  mL/min (by C-G formula based on SCr of 1.18 mg/dL). ------------------------------------------------------------------------------------------------------------------ No results for input(s): TSH, T4TOTAL, T3FREE, THYROIDAB in the last 72 hours.  Invalid input(s): FREET3   Coagulation profile  Recent Labs Lab 12/07/15 0322  INR 2.00   ------------------------------------------------------------------------------------------------------------------- No results for input(s): DDIMER in the last 72 hours. -------------------------------------------------------------------------------------------------------------------  Cardiac Enzymes  Recent Labs Lab 12/07/15 0257  TROPONINI 0.03*   ------------------------------------------------------------------------------------------------------------------ Invalid input(s): POCBNP  ---------------------------------------------------------------------------------------------------------------  Urinalysis    Component Value Date/Time   COLORURINE YELLOW (A) 09/25/2015 1000   APPEARANCEUR CLEAR (A) 09/25/2015 1000   APPEARANCEUR Clear 06/05/2014 1650   LABSPEC 1.018 09/25/2015 1000   LABSPEC 1.020 06/05/2014 1650   PHURINE 5.0 09/25/2015 1000   GLUCOSEU >500 (A) 09/25/2015 1000   GLUCOSEU >=500 06/05/2014 1650   HGBUR NEGATIVE 09/25/2015 1000   BILIRUBINUR NEGATIVE 09/25/2015 1000   BILIRUBINUR Negative 06/05/2014 1650   KETONESUR TRACE (A) 09/25/2015 1000   PROTEINUR NEGATIVE 09/25/2015 1000   NITRITE NEGATIVE 09/25/2015 1000   LEUKOCYTESUR NEGATIVE 09/25/2015 1000   LEUKOCYTESUR Trace 06/05/2014 1650     RADIOLOGY: Dg Chest Port 1 View  Result Date: 12/07/2015 CLINICAL DATA:  Shortness of breath for several days, productive cough. History CHF and COPD. EXAM: PORTABLE CHEST 1 VIEW COMPARISON:  Chest radiograph Sep 25, 2015 FINDINGS: Cardiac silhouette is moderately enlarged, unchanged. Status post median sternotomy. Mild  chronic bronchitic changes without pleural effusion or focal consolidation. No pneumothorax. Status post median sternotomy with fractured most caudal well. Single lead LEFT cardiac pacemaker in situ. Soft tissue planes and included osseous structures are unchanged, osteopenia. Old LEFT posterior rib fractures. IMPRESSION: Stable cardiomegaly and chronic bronchitic changes. Electronically Signed   By: Awilda Metro M.D.   On: 12/07/2015 03:36    EKG: Orders placed or performed during the hospital encounter of 12/07/15  . ED EKG  . ED EKG  . EKG 12-Lead  . EKG 12-Lead  . EKG 12-Lead  . EKG 12-Lead    IMPRESSION AND PLAN: 82 year old male patient with history of COPD, chronic atrial fibrillation, systolic heart failure, diabetes mellitus, hypertension presented to the emergency room with shortness of breath and wheezing. Admitting diagnosis 1. Acute COPD exacerbation 2. Acute on chronic respiratory failure 3. Chronic atrial fibrillation 4. Systolic heart failure 5. Type 2 diabetes mellitus 6. Hypertension Treatment plan Admit patient to medical floor IV Solu-Medrol 125 mg every 6 hourly Nebulization treatments around-the-clock Resume anticoagulation with oral Coumadin Resume Lasix for diuresis Oxygen via nasal cannula Supportive care.   All the records are reviewed and case discussed with ED provider. Management  plans discussed with the patient, family and they are in agreement.  CODE STATUS:DNR Code Status History    Date Active Date Inactive Code Status Order ID Comments User Context   09/25/2015  1:18 AM 09/28/2015  8:53 PM DNR [id number]  Oralia Manis, MD Inpatient   08/07/2015  7:50 PM 08/09/2015  3:23 PM DNR [id number]  Darrol Jump, PA-C Inpatient   08/03/2015  4:58 PM 08/07/2015  7:50 PM DNR [id number]  Houston Siren, MD ED   02/25/2015  7:56 PM 03/01/2015  7:21 PM Full Code [id number]  Gale Journey, MD ED    Questions for Most Recent Historical Code Status (Order  [id number])    Question Answer Comment   In the event of cardiac or respiratory ARREST Do not call a "code blue"    In the event of cardiac or respiratory ARREST Do not perform Intubation, CPR, defibrillation or ACLS    In the event of cardiac or respiratory ARREST Use medication by any route, position, wound care, and other measures to relive pain and suffering. May use oxygen, suction and manual treatment of airway obstruction as needed for comfort.         Advance Directive Documentation   Flowsheet Row Most Recent Value  Type of Advance Directive  Healthcare Power of Attorney  Pre-existing out of facility DNR order (yellow form or pink MOST form)  No data  "MOST" Form in Place?  No data       TOTAL TIME TAKING CARE OF THIS PATIENT: 56 minutes.    Ihor Austin M.D on 12/07/2015 at 5:21 AM  Between 7am to 6pm - Pager - [phone]  After 6pm go to [email or web address] - password [email or web address] [facility]  [facility] [place]  Office  [phone]  CC: Primary care physician; Barbette Reichmann, MD

## 2015-12-07 NOTE — Progress Notes (Signed)
[facility] Phys[facility] - [place] at [facility]                                                                                                                                                                                            Patient Demographics   Troy [name], is a 82 y.o. male, DOB - [birth year], ONG:[id number]  Admit date - 12/07/2015   Admitting Physician Ihor Austin, MD  Outpatient Primary MD for the patient is HANDE,VISHWANATH, MD   LOS - 0  Subjective:Patient admitted with worsening shortness of breath and cough. Patient continues to complain of shortness of breath wheezing denies any chest pains   Review of Systems:   CONSTITUTIONAL: No documented fever. No fatigue, weakness. No weight gain, no weight loss.  EYES: No blurry or double vision.  ENT: No tinnitus. No postnasal drip. No redness of the oropharynx.  RESPIRATORY:Positive cough, Positive wheeze, no hemoptysis. Positive dyspnea.  CARDIOVASCULAR: No chest pain. No orthopnea. No palpitations. No syncope.  GASTROINTESTINAL: No nausea, no vomiting or diarrhea. No abdominal pain. No melena or hematochezia.  GENITOURINARY: No dysuria or hematuria.  ENDOCRINE: No polyuria or nocturia. No heat or cold intolerance.  HEMATOLOGY: No anemia. No bruising. No bleeding.  INTEGUMENTARY: No rashes. No lesions.  MUSCULOSKELETAL: No arthritis. No swelling. No gout.  NEUROLOGIC: No numbness, tingling, or ataxia. No seizure-type activity.  PSYCHIATRIC: No anxiety. No insomnia. No ADD.    Vitals:   Vitals:   12/07/15 0647 12/07/15 0750 12/07/15 0804 12/07/15 1303  BP: 129/63  (!) 141/84   Pulse: 86  87   Resp: (!) 22     Temp: 97.9 F (36.6 C)  98.5 F (36.9 C)   TempSrc: Oral  Axillary   SpO2: 98% 96% 97% 96%  Weight:      Height:        Wt Readings from Last 3 Encounters:  12/07/15 104.3 kg (230 lb)  11/14/15 104.3 kg (230 lb)  11/13/15 108.9 kg (240 lb)     Intake/Output Summary (Last 24  hours) at 12/07/15 1346 Last data filed at 12/07/15 1105  Gross per 24 hour  Intake              290 ml  Output                0 ml  Net              290 ml    Physical Exam:   GENERAL: Pleasant-appearing in no apparent distress.  HEAD, EYES, EARS, NOSE AND THROAT: Atraumatic, normocephalic. Extraocular muscles are intact. Pupils equal and reactive to  light. Sclerae anicteric. No conjunctival injection. No oro-pharyngeal erythema.  NECK: Supple. There is no jugular venous distention. No bruits, no lymphadenopathy, no thyromegaly.  HEART: Regular rate and rhythm,. No murmurs, no rubs, no clicks.  LUNGS: Bilateral wheezing throughout both lungs no rhonchi or crackles ABDOMEN: Soft, flat, nontender, nondistended. Has good bowel sounds. No hepatosplenomegaly appreciated.  EXTREMITIES: No evidence of any cyanosis, clubbing, or peripheral edema.  +2 pedal and radial pulses bilaterally.  NEUROLOGIC: The patient is alert, awake, and oriented x3 with no focal motor or sensory deficits appreciated bilaterally.  SKIN: Moist and warm with no rashes appreciated.  Psych: Not anxious, depressed LN: No inguinal LN enlargement    Antibiotics   Anti-infectives    Start     Dose/Rate Route Frequency Ordered Stop   12/07/15 1100  cefTRIAXone (ROCEPHIN) 1 g in dextrose 5 % 50 mL IVPB     1 g 100 mL/hr over 30 Minutes Intravenous Every 24 hours 12/07/15 1005        Medications   Scheduled Meds: . carbidopa-levodopa  1 tablet Oral TID  . cefTRIAXone (ROCEPHIN)  IV  1 g Intravenous Q24H  . furosemide  40 mg Oral BH-q7a  . guaiFENesin  600 mg Oral BID  . insulin aspart  0-20 Units Subcutaneous TID WC  . insulin aspart  0-5 Units Subcutaneous QHS  . ipratropium-albuterol  3 mL Nebulization Q6H  . linagliptin  5 mg Oral Daily  . lisinopril  40 mg Oral QHS  . loratadine  10 mg Oral Daily  . methylPREDNISolone (SOLU-MEDROL) injection  60 mg Intravenous Q6H  . metoprolol succinate  25 mg Oral QPM   . sodium chloride flush  3 mL Intravenous Q12H  . [START ON 12/08/2015] warfarin  3 mg Oral Q T,Th,Sat-1800  . warfarin  5 mg Oral Q M,W,F,Su-1800  . Warfarin - Physician Dosing Inpatient   Does not apply q1800   Continuous Infusions:  PRN Meds:.sodium chloride, acetaminophen **OR** acetaminophen, ondansetron **OR** ondansetron (ZOFRAN) IV, senna-docusate, sodium chloride flush   Data Review:   Micro Results Recent Results (from the past 240 hour(s))  Culture, blood (routine x 2)     Status: None (Preliminary result)   Collection Time: 12/07/15  3:22 AM  Result Value Ref Range Status   Specimen Description BLOOD RIGHT ARM  Final   Special Requests BOTTLES DRAWN AEROBIC AND ANAEROBIC  Final   Culture NO GROWTH < 12 HOURS  Final   Report Status PENDING  Incomplete  Culture, blood (routine x 2)     Status: None (Preliminary result)   Collection Time: 12/07/15  3:22 AM  Result Value Ref Range Status   Specimen Description BLOOD LEFT ARM  Final   Special Requests BOTTLES DRAWN AEROBIC AND ANAEROBIC  Final   Culture NO GROWTH < 12 HOURS  Final   Report Status PENDING  Incomplete    Radiology Reports Dg Chest Port 1 View  Result Date: 12/07/2015 CLINICAL DATA:  Shortness of breath for several days, productive cough. History CHF and COPD. EXAM: PORTABLE CHEST 1 VIEW COMPARISON:  Chest radiograph Sep 25, 2015 FINDINGS: Cardiac silhouette is moderately enlarged, unchanged. Status post median sternotomy. Mild chronic bronchitic changes without pleural effusion or focal consolidation. No pneumothorax. Status post median sternotomy with fractured most caudal well. Single lead LEFT cardiac pacemaker in situ. Soft tissue planes and included osseous structures are unchanged, osteopenia. Old LEFT posterior rib fractures. IMPRESSION: Stable cardiomegaly and chronic bronchitic changes. Electronically Signed  By: Awilda Metro M.D.   On: 12/07/2015 03:36     CBC  Recent Labs Lab  12/07/15 0257  WBC 11.5*  HGB 13.3  HCT 39.2*  PLT 149*  MCV 91.3  MCH 31.1  MCHC 34.0  RDW 15.9*  LYMPHSABS 1.8  MONOABS 1.1*  EOSABS 0.5  BASOSABS 0.1    Chemistries   Recent Labs Lab 12/07/15 0257  NA 141  K 4.1  CL 104  CO2 30  GLUCOSE 67  BUN 33*  CREATININE 1.18  CALCIUM 8.8*  AST 20  ALT 8*  ALKPHOS 64  BILITOT 0.9   ------------------------------------------------------------------------------------------------------------------ estimated creatinine clearance is 49.5 mL/min (by C-G formula based on SCr of 1.18 mg/dL). ------------------------------------------------------------------------------------------------------------------ No results for input(s): HGBA1C in the last 72 hours. ------------------------------------------------------------------------------------------------------------------ No results for input(s): CHOL, HDL, LDLCALC, TRIG, CHOLHDL, LDLDIRECT in the last 72 hours. ------------------------------------------------------------------------------------------------------------------ No results for input(s): TSH, T4TOTAL, T3FREE, THYROIDAB in the last 72 hours.  Invalid input(s): FREET3 ------------------------------------------------------------------------------------------------------------------ No results for input(s): VITAMINB12, FOLATE, FERRITIN, TIBC, IRON, RETICCTPCT in the last 72 hours.  Coagulation profile  Recent Labs Lab 12/07/15 0322  INR 2.00    No results for input(s): DDIMER in the last 72 hours.  Cardiac Enzymes  Recent Labs Lab 12/07/15 0257  TROPONINI 0.03*   ------------------------------------------------------------------------------------------------------------------ Invalid input(s): POCBNP    Assessment & Plan  Patient is a 82 year old male with history of COPD presenting with shortness of breath  1. Acute on chronic COPD exacerbation: Continue therapy with nebulizers, I will decrease the dose  of IV Solu-Medrol Also for acute bronchitis will start him on ceftriaxone 2. Acute on chronic respiratory failure continue supportive care with oxygen therapy 3. Chronic atrial fibrillation heart rate is currently stable continue current therapy continue Coumadin therapy 4. Systolic heart failure currently compensated continue Lasix 5. Type 2 diabetes mellitus patient had episode of hypoglycemia at home. Currently on sliding scale 6. essential hypertension continue Toprol-XL      Code Status Orders        Start     Ordered   12/07/15 0628  Do not attempt resuscitation (DNR)  Continuous    Question Answer Comment  In the event of cardiac or respiratory ARREST Do not call a "code blue"   In the event of cardiac or respiratory ARREST Do not perform Intubation, CPR, defibrillation or ACLS   In the event of cardiac or respiratory ARREST Use medication by any route, position, wound care, and other measures to relive pain and suffering. May use oxygen, suction and manual treatment of airway obstruction as needed for comfort.      12/07/15 1096    Code Status History    Date Active Date Inactive Code Status Order ID Comments User Context   09/25/2015  1:18 AM 09/28/2015  8:53 PM DNR [id number]  Oralia Manis, MD Inpatient   08/07/2015  7:50 PM 08/09/2015  3:23 PM DNR [id number]  Darrol Jump, PA-C Inpatient   08/03/2015  4:58 PM 08/07/2015  7:50 PM DNR [id number]  Houston Siren, MD ED   02/25/2015  7:56 PM 03/01/2015  7:21 PM Full Code [id number]  Gale Journey, MD ED    Advance Directive Documentation   Flowsheet Row Most Recent Value  Type of Advance Directive  Healthcare Power of Attorney  Pre-existing out of facility DNR order (yellow form or pink MOST form)  No data  "MOST" Form in Place?  No data  Consults None   DVT Prophylaxis  warfarin  Lab Results  Component Value Date   PLT 149 (L) 12/07/2015     Time Spent in minutes   35 minutes  Greater than 50% of  time spent in care coordination and counseling patient regarding the condition and plan of care.   Auburn Bilberry M.D on 12/07/2015 at 1:46 PM  Between 7am to 6pm - Pager - [phone]  After 6pm go to [email or web address] - password [email or web address] [facility]  [facility]   Office  [phone]

## 2015-12-07 NOTE — ED Notes (Signed)
Pt OTF att

## 2015-12-07 NOTE — Care Management Important Message (Signed)
Important Message  Patient Details  Name: Troy [name] MRN: [id number] Date of Birth: January 03, 1924   Medicare Important Message Given:  Yes    Adonis Huguenin, RN 12/07/2015, 10:42 AM

## 2015-12-07 NOTE — ED Triage Notes (Addendum)
Pt presents to ED with c/o sob for the past "several days". Productive cough with yellow sputum. Denies chest pain at this time. Hx of CHF and COPD. 4 breathing tx at home with no relief. Increased work of breathing with audible wheezing present at this time. Skin warm and dry.  Pt wife states pt blood sugar "keeps bottoming out" so they have cut back his insulin.

## 2015-12-07 NOTE — ED Provider Notes (Signed)
[facility] Emergency Department Provider Note   ____________________________________________   First MD Initiated Contact with Patient 12/07/15 0255     (approximate)  I have reviewed the triage vital signs and the nursing notes.   HISTORY  Chief Complaint Shortness of Breath    HPI Troy [name] is a 82 y.o. male who presents to the ED from home with a chief complaint of shortness of breath. Patient has a history of COPD, CHF who has access to home oxygenwho complains of a one-week history of cough productive of yellow sputum associated with shortness of breath. Had 4 breathing treatments at home with no relief. Wife states patient's blood sugar "bottomed out" yesterday. EMS was called to the house who got patient's blood sugar back up. Spouse states he has been taking his insulin without eating as usual. Patient denies associated fever, chills, transmitted, abdominal pain, nausea, vomiting, diarrhea. Denies recent travel trauma. Nothing makes his symptoms better or worse.   Past Medical History:  Diagnosis Date  . Chronic atrial fibrillation (HCC)    a. on Couamdin; b. CHADS2VASc at least 7 (CHF, HTN, age x 2, DM, stroke x 2); c. s/p MAZE 1999  . Chronic systolic CHF (congestive heart failure) (HCC)    a. echo 08/01/2015: EF of 45%, mild LVH, mitral valve with annular calcification, mitral valve partially mobile  . CKD (chronic kidney disease), stage III   . COPD (chronic obstructive pulmonary disease) (HCC)   . Diabetes mellitus without complication (HCC)   . Hypertension   . Hypothyroidism   . Mitral valve prolapse    a. s/p mitral valve repair 1999  . Normal coronary arteries    a. by cardiac cath 1999  . Parkinson's disease (HCC)   . Stroke (HCC)   . TIA (transient ischemic attack)   . UTI (lower urinary tract infection)     Patient Active Problem List   Diagnosis Date Noted  . Acute on chronic respiratory failure with hypoxia (HCC)  09/28/2015  . HTN (hypertension) 09/25/2015  . Parkinson's disease (HCC) 09/25/2015  . Hypothyroidism 09/25/2015  . COPD exacerbation (HCC) 09/12/2015  . Diabetes (HCC) 08/20/2015  . SSS (sick sinus syndrome) (HCC) 08/07/2015  . Chronic atrial fibrillation (HCC)   . Sinus pause   . Sick sinus syndrome (HCC)   . Acute on chronic diastolic CHF (congestive heart failure), NYHA class 1 (HCC) 08/03/2015  . Lower extremity weakness 02/25/2015    Past Surgical History:  Procedure Laterality Date  . CARDIAC CATHETERIZATION N/A 08/08/2015   Procedure: Temporary Pacemaker;  Surgeon: Peter M Swaziland, MD;  Location: [facility] INVASIVE CV LAB;  Service: Cardiovascular;  Laterality: N/A;  . CARDIAC SURGERY    . CHOLECYSTECTOMY    . EP IMPLANTABLE DEVICE N/A 08/08/2015   Procedure: Pacemaker Implant;  Surgeon: Will Jorja Loa, MD;  Location: [facility] INVASIVE CV LAB;  Service: Cardiovascular;  Laterality: N/A;    Prior to Admission medications   Medication Sig Start Date End Date Taking? Authorizing Provider  albuterol (PROVENTIL HFA) 108 (90 Base) MCG/ACT inhaler Inhale 2 puffs into the lungs every 4 (four) hours as needed for wheezing or shortness of breath. 07/11/15  Yes Sharman Cheek, MD  albuterol (PROVENTIL) (2.5 MG/3ML) 0.083% nebulizer solution Take 2.5 mg by nebulization every 6 (six) hours as needed for wheezing or shortness of breath.   Yes Historical Provider, MD  carbidopa-levodopa (SINEMET IR) 25-250 MG tablet Take 1 tablet by mouth 3 (three) times daily.  Yes Historical Provider, MD  cetirizine (ZYRTEC) 10 MG tablet Take 10 mg by mouth daily.   Yes Historical Provider, MD  furosemide (LASIX) 40 MG tablet Take 40 mg by mouth every morning.    Yes Historical Provider, MD  glipiZIDE (GLUCOTROL) 10 MG tablet Take 5-10 mg by mouth 2 (two) times daily. Pt takes one tablet in the morning and one-half tablet in the evening.   Yes Historical Provider, MD  insulin regular human CONCENTRATED (HUMULIN R) 500  UNIT/ML injection Inject into the skin 2 (two) times daily with a meal. 12 units in to skin in the morning and 8 units into skin in the evenings   Yes Historical Provider, MD  lisinopril (PRINIVIL,ZESTRIL) 40 MG tablet Take 40 mg by mouth at bedtime.    Yes Historical Provider, MD  metoprolol succinate (TOPROL-XL) 25 MG 24 hr tablet Take 1 tablet (25 mg total) by mouth every evening. Take with or immediately following a meal. 08/06/15  Yes Vipul Sherryll Burger, MD  Multiple Vitamins-Minerals (PRESERVISION AREDS 2) CAPS Take 1 capsule by mouth 2 (two) times daily. Reported on 10/23/2015   Yes Historical Provider, MD  saxagliptin HCl (ONGLYZA) 2.5 MG TABS tablet Take 5 mg by mouth every evening.   Yes Historical Provider, MD  Tetrahydroz-Dextran-PEG-Povid (EYE DROPS ADVANCED RELIEF) 0.05-0.1-1-1 % SOLN Place 1 drop into both eyes daily.   Yes Historical Provider, MD  warfarin (COUMADIN) 3 MG tablet Take 3 mg by mouth every evening. Pt takes on Tuesday, Thursday, and Saturday.   Yes Historical Provider, MD  warfarin (COUMADIN) 6 MG tablet Take 5 mg by mouth every evening. Pt takes on Monday, Wednesday, Friday, and Sunday.   Yes Historical Provider, MD  HYDROcodone-acetaminophen (NORCO/VICODIN) 5-325 MG tablet Take 1 tablet by mouth every 6 (six) hours as needed for moderate pain or severe pain. Patient not taking: Reported on 12/07/2015 11/14/15   Yevette Edwards, MD  predniSONE (STERAPRED UNI-PAK 21 TAB) 10 MG (21) TBPK tablet Take 1 tablet (10 mg total) by mouth daily. Please take 6 pills in the morning on the day 1 and 2, then taper by one pill every 2 days until finished. Thank you Patient not taking: Reported on 12/07/2015 09/28/15   Katharina Caper, MD    Allergies Amiodarone  Family History  Problem Relation Age of Onset  . Hypertension Mother   . Stroke Mother   . CAD Father   . Lymphoma Sister   . Lung disease Brother     Social History Social History  Substance Use Topics  . Smoking status: Former  Smoker    Packs/day: 4.00    Years: 20.00    Types: Cigarettes  . Smokeless tobacco: Never Used  . Alcohol use No    Review of Systems  Constitutional: No fever/chills. Eyes: No visual changes. ENT: No sore throat. Cardiovascular: Denies chest pain. Respiratory: Positive for productive cough and shortness of breath. Gastrointestinal: No abdominal pain.  No nausea, no vomiting.  No diarrhea.  No constipation. Genitourinary: Negative for dysuria. Musculoskeletal: Negative for back pain. Skin: Negative for rash. Neurological: Negative for headaches, focal weakness or numbness.  10-point ROS otherwise negative.  ____________________________________________   PHYSICAL EXAM:  VITAL SIGNS: ED Triage Vitals  Enc Vitals Group     BP      Pulse      Resp      Temp      Temp src      SpO2      Weight  Height      Head Circumference      Peak Flow      Pain Score      Pain Loc      Pain Edu?      Excl. in GC?     Constitutional: Alert and oriented. Well appearing and in moderate acute distress. Eyes: Conjunctivae are normal. PERRL. EOMI. Head: Atraumatic. Nose: No congestion/rhinnorhea. Mouth/Throat: Mucous membranes are moist.  Oropharynx non-erythematous. Neck: No stridor.   Cardiovascular: Normal rate, regular rhythm. Grossly normal heart sounds.  Good peripheral circulation. Respiratory: Increased respiratory effort.  No retractions. Lungs with audible wheezing. Gastrointestinal: Soft and nontender. No distention. No abdominal bruits. No CVA tenderness. Musculoskeletal: No lower extremity tenderness. 1+ BLE nonpitting edema.  No joint effusions. Neurologic:  Normal speech and language. No gross focal neurologic deficits are appreciated.  Skin:  Skin is warm, dry and intact. No rash noted. Psychiatric: Mood and affect are normal. Speech and behavior are normal.  ____________________________________________   LABS (all labs ordered are listed, but only  abnormal results are displayed)  Labs Reviewed  CBC WITH DIFFERENTIAL/PLATELET - Abnormal; Notable for the following:       Result Value   WBC 11.5 (*)    RBC 4.30 (*)    HCT 39.2 (*)    RDW 15.9 (*)    Platelets 149 (*)    Neutro Abs 7.9 (*)    Monocytes Absolute 1.1 (*)    All other components within normal limits  COMPREHENSIVE METABOLIC PANEL - Abnormal; Notable for the following:    BUN 33 (*)    Calcium 8.8 (*)    ALT 8 (*)    GFR calc non Af Amer 52 (*)    All other components within normal limits  TROPONIN I - Abnormal; Notable for the following:    Troponin I 0.03 (*)    All other components within normal limits  PROTIME-INR - Abnormal; Notable for the following:    Prothrombin Time 23.0 (*)    All other components within normal limits  CULTURE, BLOOD (ROUTINE X 2)  CULTURE, BLOOD (ROUTINE X 2)  BRAIN NATRIURETIC PEPTIDE  LACTIC ACID, PLASMA  LACTIC ACID, PLASMA  BLOOD GAS, ARTERIAL   ____________________________________________  EKG  ED ECG REPORT I, SUNG,JADE J, the attending physician, personally viewed and interpreted this ECG.   Date: 12/07/2015  EKG Time: 0304  Rate: 84  Rhythm: Ventricular-paced complexes  Axis: LAD  Intervals: Paced rhythm  ST&T Change: Nonspecific  ____________________________________________  RADIOLOGY  Portable chest x-ray (viewed by me, interpreted per Dr. Karie Kirks): Stable cardiomegaly and chronic bronchitic changes. ____________________________________________   PROCEDURES  Procedure(s) performed: None  Procedures  Critical Care performed: Yes, see critical care note(s)   CRITICAL CARE Performed by: Irean Hong   Total critical care time: 30 minutes  Critical care time was exclusive of separately billable procedures and treating other patients.  Critical care was necessary to treat or prevent imminent or life-threatening deterioration.  Critical care was time spent personally by me on the following  activities: development of treatment plan with patient and/or surrogate as well as nursing, discussions with consultants, evaluation of patient's response to treatment, examination of patient, obtaining history from patient or surrogate, ordering and performing treatments and interventions, ordering and review of laboratory studies, ordering and review of radiographic studies, pulse oximetry and re-evaluation of patient's condition.  ____________________________________________   INITIAL IMPRESSION / ASSESSMENT AND PLAN / ED COURSE  Pertinent labs & imaging results  that were available during my care of the patient were reviewed by me and considered in my medical decision making (see chart for details).  82 year old male with a history of COPD, CHF, diabetes who presents with productive cough and shortness of breath. Will obtain screening labwork, chest xray, initiate IV solumedrol, duonebs and reassess.  Clinical Course  Comment By Time  Patient remains without tachypnea after first nebulizer treatment although wheezing has decreased. Will administer second nebulizer treatment as well as obtain ABG; consider BiPAP if patient does not improve. Room air saturations 91%; patient was placed on 2 L nasal cannula oxygen. Discussed case with hospitalist who will evaluate patient in the emergency department for admission. Irean Hong, MD 08/04 [phone]     ____________________________________________   FINAL CLINICAL IMPRESSION(S) / ED DIAGNOSES  Final diagnoses:  COPD exacerbation (HCC)  SOB (shortness of breath)  Hypoxia  Acute on chronic respiratory failure with hypoxia (HCC)      NEW MEDICATIONS STARTED DURING THIS VISIT:  New Prescriptions   No medications on file     Note:  This document was prepared using Dragon voice recognition software and may include unintentional dictation errors.    Irean Hong, MD 12/08/15 [phone]

## 2015-12-07 NOTE — Progress Notes (Signed)
ANTICOAGULATION CONSULT NOTE - Initial Consult  Pharmacy Consult for Warfarin Indication: atrial fibrillation  No Known Allergies  Patient Measurements: Height: 5\' 9"  (175.3 cm) Weight: 230 lb (104.3 kg) IBW/kg (Calculated) : 70.7 Heparin Dosing Weight:   Vital Signs: Temp: 98.4 F (36.9 C) (08/04 1449) Temp Source: Oral (08/04 1449) BP: 146/70 (08/04 1449) Pulse Rate: 87 (08/04 1449)  Labs:  Recent Labs  12/07/15 0257 12/07/15 0322  HGB 13.3  --   HCT 39.2*  --   PLT 149*  --   LABPROT  --  23.0*  INR  --  2.00  CREATININE 1.18  --   TROPONINI 0.03*  --     Estimated Creatinine Clearance: 49.5 mL/min (by C-G formula based on SCr of 1.18 mg/dL).   Medical History: Past Medical History:  Diagnosis Date  . Chronic atrial fibrillation (HCC)    a. on Couamdin; b. CHADS2VASc at least 7 (CHF, HTN, age x 2, DM, stroke x 2); c. s/p MAZE 1999  . Chronic systolic CHF (congestive heart failure) (HCC)    a. echo 08/01/2015: EF of 45%, mild LVH, mitral valve with annular calcification, mitral valve partially mobile  . CKD (chronic kidney disease), stage III   . COPD (chronic obstructive pulmonary disease) (HCC)   . Diabetes mellitus without complication (HCC)   . Hypertension   . Hypothyroidism   . Mitral valve prolapse    a. s/p mitral valve repair 1999  . Normal coronary arteries    a. by cardiac cath 1999  . Parkinson's disease (HCC)   . Stroke (HCC)   . TIA (transient ischemic attack)   . UTI (lower urinary tract infection)     Medications:  Scheduled:  . carbidopa-levodopa  1 tablet Oral TID  . cefTRIAXone (ROCEPHIN)  IV  1 g Intravenous Q24H  . furosemide  40 mg Oral BH-q7a  . guaiFENesin  600 mg Oral BID  . insulin aspart  0-20 Units Subcutaneous TID WC  . insulin aspart  0-5 Units Subcutaneous QHS  . ipratropium-albuterol  3 mL Nebulization Q6H  . linagliptin  5 mg Oral Daily  . lisinopril  40 mg Oral QHS  . loratadine  10 mg Oral Daily  .  methylPREDNISolone (SOLU-MEDROL) injection  60 mg Intravenous Q6H  . metoprolol succinate  25 mg Oral QPM  . sodium chloride flush  3 mL Intravenous Q12H  . [START ON 12/08/2015] warfarin  3 mg Oral Q T,Th,Sat-1800  . warfarin  5 mg Oral Q M,W,F,Su-1800  . Warfarin - Pharmacist Dosing Inpatient   Does not apply q1800    Assessment: 82 yo male on Warfarin for Afib.  Home dose:  Warfarin 3 mg  T,Th,Sat and  5 mg M, W, F, Sun.  8/4 INR 2.00      Goal of Therapy:  INR 2-3 Monitor platelets by anticoagulation protocol: Yes   Plan:  INR therapeutic.  Will continue patient's home regimen. Follow up INR in am. Patient on Ceftriaxone.   [place] A 12/07/2015,3:24 PM

## 2015-12-07 NOTE — Care Management Note (Signed)
Case Management Note  Patient Details  Name: EVARISTO SPENNER MRN: [id number] Date of Birth: [birth year]  Subjective/Objective:       Spoke with patient spouse and caregiver at the bedside. Patient from home with [facility] but was just released last week. Patient was ambulating 300-425ft per caregiver and doing well at home so he was relased from [facility] services. Has a walker and hospital bed at home.  Would like PT evaulation prior to discharge.             Action/Plan: Antcipated discharge plan is Home withHH. Needs PT evaluation.   Expected Discharge Date:                  Expected Discharge Plan:  Home w Home Health Services  In-House Referral:     Discharge planning Services  CM Consult  Post Acute Care Choice:    Choice offered to:  Spouse  DME Arranged:    DME Agency:     HH Arranged:    HH Agency:   [place])  Status of Service:  In process, will continue to follow  If discussed at Long Length of Stay Meetings, dates discussed:    Additional Comments:  Adonis Huguenin, RN 12/07/2015, 2:35 PM

## 2015-12-08 LAB — BASIC METABOLIC PANEL
Anion gap: 9 (ref 5–15)
BUN: 38 mg/dL — ABNORMAL HIGH (ref 6–20)
CALCIUM: 8.9 mg/dL (ref 8.9–10.3)
CO2: 27 mmol/L (ref 22–32)
CREATININE: 1.18 mg/dL (ref 0.61–1.24)
Chloride: 102 mmol/L (ref 101–111)
GFR calc non Af Amer: 52 mL/min — ABNORMAL LOW (ref >60)
Glucose, Bld: 308 mg/dL — ABNORMAL HIGH (ref 65–99)
Potassium: 4.7 mmol/L (ref 3.5–5.1)
SODIUM: 138 mmol/L (ref 135–145)

## 2015-12-08 LAB — GLUCOSE, CAPILLARY
Glucose-Capillary: 269 mg/dL — ABNORMAL HIGH (ref 65–99)
Glucose-Capillary: 308 mg/dL — ABNORMAL HIGH (ref 65–99)
Glucose-Capillary: 320 mg/dL — ABNORMAL HIGH (ref 65–99)
Glucose-Capillary: 328 mg/dL — ABNORMAL HIGH (ref 65–99)
Glucose-Capillary: 331 mg/dL — ABNORMAL HIGH (ref 65–99)
Glucose-Capillary: 357 mg/dL — ABNORMAL HIGH (ref 65–99)
Glucose-Capillary: 451 mg/dL — ABNORMAL HIGH (ref 65–99)

## 2015-12-08 LAB — CBC
HCT: 39.7 % — ABNORMAL LOW (ref 40.0–52.0)
Hemoglobin: 13.1 g/dL (ref 13.0–18.0)
MCH: 30.6 pg (ref 26.0–34.0)
MCHC: 33 g/dL (ref 32.0–36.0)
MCV: 92.7 fL (ref 80.0–100.0)
PLATELETS: 138 10*3/uL — AB (ref 150–440)
RBC: 4.28 MIL/uL — AB (ref 4.40–5.90)
RDW: 15.8 % — ABNORMAL HIGH (ref 11.5–14.5)
WBC: 16.5 10*3/uL — AB (ref 3.8–10.6)

## 2015-12-08 LAB — PROTIME-INR
INR: 2.83
PROTHROMBIN TIME: 30.3 s — AB (ref 11.4–15.2)

## 2015-12-08 LAB — HEMOGLOBIN A1C: Hgb A1c MFr Bld: 8.4 % — ABNORMAL HIGH (ref 4.0–6.0)

## 2015-12-08 MED ORDER — INSULIN ASPART 100 UNIT/ML ~~LOC~~ SOLN
4.0000 [IU] | Freq: Three times a day (TID) | SUBCUTANEOUS | Status: DC
  Administered 2015-12-08 – 2015-12-10 (×6): 4 [IU] via SUBCUTANEOUS
  Filled 2015-12-08 (×7): qty 4

## 2015-12-08 MED ORDER — AZITHROMYCIN 250 MG PO TABS
250.0000 mg | ORAL_TABLET | Freq: Every day | ORAL | Status: DC
  Administered 2015-12-09 – 2015-12-11 (×3): 250 mg via ORAL
  Filled 2015-12-08 (×3): qty 1

## 2015-12-08 MED ORDER — METHYLPREDNISOLONE SODIUM SUCC 40 MG IJ SOLR
40.0000 mg | Freq: Four times a day (QID) | INTRAMUSCULAR | Status: DC
  Administered 2015-12-08 – 2015-12-10 (×8): 40 mg via INTRAVENOUS
  Filled 2015-12-08 (×8): qty 1

## 2015-12-08 MED ORDER — AZITHROMYCIN 500 MG PO TABS
500.0000 mg | ORAL_TABLET | Freq: Every day | ORAL | Status: AC
  Administered 2015-12-08: 500 mg via ORAL
  Filled 2015-12-08: qty 1

## 2015-12-08 MED ORDER — BUDESONIDE 0.25 MG/2ML IN SUSP
0.2500 mg | Freq: Two times a day (BID) | RESPIRATORY_TRACT | Status: DC
  Administered 2015-12-08 – 2015-12-11 (×7): 0.25 mg via RESPIRATORY_TRACT
  Filled 2015-12-08 (×7): qty 2

## 2015-12-08 NOTE — Evaluation (Signed)
Physical Therapy Evaluation Patient Details Name: Troy [name] MRN: [id number] DOB: 01-06-1924 Today's Date: 12/08/2015   History of Present Illness  Patient admitted with acute COPD exacerbation. Patient has h/o Parkinson's, CVA, COPD, CHF uses 2 liters O2 at home.   Clinical Impression  Pt is able to ambulate well with walker and 2 liters O2 and though he has some minimal fatigue he ultimately does well.  He should do steps (has ~10 to get into home) before he leaves, but ultimately he is safe to return home and apparently his caregiver is good about keeping him active and walking.  Pt should not need further PT intervention on discharge.    Follow Up Recommendations No PT follow up    Equipment Recommendations       Recommendations for Other Services       Precautions / Restrictions Precautions Precautions: Fall Restrictions Weight Bearing Restrictions: No      Mobility  Bed Mobility Overal bed mobility: Modified Independent             General bed mobility comments: Pt able to get himself up to sitting EOB with good effort and overall safety.    Transfers Overall transfer level: Modified independent Equipment used: Rolling walker (2 wheeled)             General transfer comment: Pt able to stand from standard height bed with some extra UE effort, but does not need direct physical assist  Ambulation/Gait Ambulation/Gait assistance: Supervision Ambulation Distance (Feet): 200 Feet Assistive device: Rolling walker (2 wheeled)       General Gait Details: Pt is able to ambulate with consistent speed and though he has some minimal fatigue his O2 remains in the 90s on 2 liters.  Stairs            Wheelchair Mobility    Modified Rankin (Stroke Patients Only)       Balance Overall balance assessment: Independent                                           Pertinent Vitals/Pain Pain Assessment: No/denies pain    Home Living  Family/patient expects to be discharged to:: Private residence Living Arrangements: Spouse/significant other Available Help at Discharge: Family;Personal care attendant;Available 24 hours/day (aide 5 days per week) Type of Home: House Home Access: Stairs to enter Entrance Stairs-Rails: Can reach both Entrance Stairs-Number of Steps: 3 Home Layout: One level        Prior Function Level of Independence: Independent with assistive device(s)         Comments: Pt ambulates in the home with caregiver nearly every day.  He is able to go to church weekly and occasionally go out to eat (uses walker out of the house)     Hand Dominance        Extremity/Trunk Assessment   Upper Extremity Assessment: Overall WFL for tasks assessed           Lower Extremity Assessment: Overall WFL for tasks assessed         Communication   Communication: No difficulties;HOH  Cognition Arousal/Alertness: Awake/alert Behavior During Therapy: WFL for tasks assessed/performed Overall Cognitive Status: Within Functional Limits for tasks assessed                      General Comments      Exercises  Assessment/Plan    PT Assessment Patient needs continued PT services  PT Diagnosis Difficulty walking;Generalized weakness   PT Problem List Decreased activity tolerance;Decreased strength;Decreased balance  PT Treatment Interventions DME instruction;Gait training;Stair training;Functional mobility training;Balance training;Therapeutic activities;Therapeutic exercise   PT Goals (Current goals can be found in the Care Plan section) Acute Rehab PT Goals Patient Stated Goal: go home PT Goal Formulation: With patient/family Time For Goal Achievement: 12/22/15 Potential to Achieve Goals: Good    Frequency Min 2X/week   Barriers to discharge        Co-evaluation               End of Session Equipment Utilized During Treatment: Gait belt;Oxygen (3, then 2 liters for  most of session) Activity Tolerance: Patient limited by fatigue Patient left: with chair alarm set;with call bell/phone within reach Nurse Communication: Mobility status         Time: 4098-1191 PT Time Calculation (min) (ACUTE ONLY): 19 min   Charges:   PT Evaluation $PT Eval Low Complexity: 1 Procedure     PT G Codes:        Malachi Pro, DPT 12/08/2015, 5:02 PM

## 2015-12-08 NOTE — Progress Notes (Signed)
Inpatient Diabetes Program Recommendations  AACE/ADA: New Consensus Statement on Inpatient Glycemic Control (2015)  Target Ranges:  Prepandial:   less Troy [name] 140 mg/dL      Peak postprandial:   less Troy [name] 180 mg/dL (1-2 hours)      Critically ill patients:  140 - 180 mg/dL  Results for Troy [name], Troy [name] (MRN [id number]) as of 12/08/2015 09:17  Ref. Range 12/07/2015 20:00 12/08/2015 00:13 12/08/2015 05:34 12/08/2015 05:35 12/08/2015 07:28  Glucose-Capillary Latest Ref Range: 65 - 99 mg/dL 562 (H) 130 (H) 865 (H) 328 (H) 331 (H)     Review of Glycemic Control  Diabetes history: DM2 Outpatient Diabetes medications: Glipizide 10 mg am+5 mg pm + U500 insulin 4 units am + 6 units pm + Onglyza 5 mg Current orders for Inpatient glycemic control: Lantus 20 units + Novolog correction 0-20 units tid with meals+0-5 units hs+Tradjenta 5 mg qd  Inpatient Diabetes Program Recommendations:  While on steroids, please consider Novolog 4-5 units tid meal coverage if eats 50% and increase Lantus to 25 units daily.  Thank you, Billy Fischer. Rodger Giangregorio, RN, MSN, CDE Inpatient Glycemic Control Team Team Pager [phone] (8am-5pm) 12/08/2015 9:22 AM

## 2015-12-08 NOTE — Progress Notes (Signed)
ANTICOAGULATION CONSULT NOTE - Follow Up  Pharmacy Consult for Warfarin Indication: atrial fibrillation  No Known Allergies  Patient Measurements: Height: 5\' 9"  (175.3 cm) Weight: 230 lb (104.3 kg) IBW/kg (Calculated) : 70.7  Vital Signs: Temp: 98 F (36.7 C) (08/05 0724) Temp Source: Oral (08/05 0724) BP: 120/60 (08/05 0724) Pulse Rate: 70 (08/05 0724)  Labs:  Recent Labs  12/07/15 0257 12/07/15 0322 12/08/15 0436  HGB 13.3  --  13.1  HCT 39.2*  --  39.7*  PLT 149*  --  138*  LABPROT  --  23.0* 30.3*  INR  --  2.00 2.83  CREATININE 1.18  --  1.18  TROPONINI 0.03*  --   --     Estimated Creatinine Clearance: 49.5 mL/min (by C-G formula based on SCr of 1.18 mg/dL).   Medical History: Past Medical History:  Diagnosis Date  . Chronic atrial fibrillation (HCC)    a. on Couamdin; b. CHADS2VASc at least 7 (CHF, HTN, age x 2, DM, stroke x 2); c. s/p MAZE 1999  . Chronic systolic CHF (congestive heart failure) (HCC)    a. echo 08/01/2015: EF of 45%, mild LVH, mitral valve with annular calcification, mitral valve partially mobile  . CKD (chronic kidney disease), stage III   . COPD (chronic obstructive pulmonary disease) (HCC)   . Diabetes mellitus without complication (HCC)   . Hypertension   . Hypothyroidism   . Mitral valve prolapse    a. s/p mitral valve repair 1999  . Normal coronary arteries    a. by cardiac cath 1999  . Parkinson's disease (HCC)   . Stroke (HCC)   . TIA (transient ischemic attack)   . UTI (lower urinary tract infection)     Medications:  Scheduled:  . carbidopa-levodopa  1 tablet Oral TID  . cefTRIAXone (ROCEPHIN)  IV  1 g Intravenous Q24H  . furosemide  40 mg Oral BH-q7a  . guaiFENesin  600 mg Oral BID  . insulin aspart  0-20 Units Subcutaneous TID WC  . insulin aspart  0-5 Units Subcutaneous QHS  . insulin glargine  20 Units Subcutaneous Daily  . ipratropium-albuterol  3 mL Nebulization Q6H  . linagliptin  5 mg Oral Daily  .  lisinopril  40 mg Oral QHS  . loratadine  10 mg Oral Daily  . methylPREDNISolone (SOLU-MEDROL) injection  60 mg Intravenous Q6H  . metoprolol succinate  25 mg Oral QPM  . sodium chloride flush  3 mL Intravenous Q12H  . warfarin  3 mg Oral Q T,Th,Sat-1800  . warfarin  5 mg Oral Q M,W,F,Su-1800  . Warfarin - Pharmacist Dosing Inpatient   Does not apply q1800    Assessment: 82 yo male on Warfarin for Afib.   Home dose:  Warfarin 3 mg  T,Th,Sat and  5 mg M, W, F, Sun.  Goal of Therapy:  INR 2-3 Monitor platelets by anticoagulation protocol: Yes   Plan:  INR therapeutic.  Will continue patient's home regimen. Follow up INR in am.  Stormy Card, RPh Clinical Pharmacist 12/08/2015,9:05 AM

## 2015-12-08 NOTE — Progress Notes (Signed)
Patient ID: Troy [name], male   DOB: October 29, 1923, 82 y.o.   MRN: [id number]  [facility] PROGRESS NOTE  Troy [name]:[id number] DOB: 24-Jun-1923 DOA: 12/07/2015 PCP: Barbette Reichmann, MD  HPI/Subjective: Patient feeling a little bit better than coming in. At home was having low sugars. Positive for shortness of breath cough and not feeling well. Normally wears 2 L of oxygen at home.   Objective: Vitals:   12/08/15 0342 12/08/15 0724  BP: (!) 122/52 120/60  Pulse: 75 70  Resp:  16  Temp: 97 F (36.1 C) 98 F (36.7 C)    Filed Weights   12/07/15 0254  Weight: 104.3 kg (230 lb)    ROS: Review of Systems  Constitutional: Negative for chills and fever.  Eyes: Negative for blurred vision.  Respiratory: Positive for cough, shortness of breath and wheezing.   Cardiovascular: Negative for chest pain.  Gastrointestinal: Negative for abdominal pain, constipation, diarrhea, nausea and vomiting.  Genitourinary: Negative for dysuria.  Musculoskeletal: Negative for joint pain.  Neurological: Negative for dizziness and headaches.   Exam: Physical Exam  Constitutional: He is oriented to person, place, and time.  HENT:  Nose: No mucosal edema.  Mouth/Throat: No oropharyngeal exudate or posterior oropharyngeal edema.  Eyes: Conjunctivae, EOM and lids are normal. Pupils are equal, round, and reactive to light.  Neck: No JVD present. Carotid bruit is not present. No edema present. No thyroid mass and no thyromegaly present.  Cardiovascular: S1 normal and S2 normal.  Exam reveals no gallop.   No murmur heard. Pulses:      Dorsalis pedis pulses are 2+ on the right side, and 2+ on the left side.  Respiratory: No respiratory distress. He has decreased breath sounds in the right upper field, the right middle field, the right lower field, the left upper field, the left middle field and the left lower field. He has wheezes in the right middle field, the right lower field and the left  middle field. He has no rhonchi. He has no rales.  GI: Soft. Bowel sounds are normal. There is no tenderness.  Musculoskeletal:       Right ankle: He exhibits swelling.       Left ankle: He exhibits swelling.  Lymphadenopathy:    He has no cervical adenopathy.  Neurological: He is alert and oriented to person, place, and time. No cranial nerve deficit.  Skin: Skin is warm. No rash noted. Nails show no clubbing.  Psychiatric: He has a normal mood and affect.      Data Reviewed: Basic Metabolic Panel:  Recent Labs Lab 12/07/15 0257 12/08/15 0436  NA 141 138  K 4.1 4.7  CL 104 102  CO2 30 27  GLUCOSE 67 308*  BUN 33* 38*  CREATININE 1.18 1.18  CALCIUM 8.8* 8.9   Liver Function Tests:  Recent Labs Lab 12/07/15 0257  AST 20  ALT 8*  ALKPHOS 64  BILITOT 0.9  PROT 6.9  ALBUMIN 3.6   CBC:  Recent Labs Lab 12/07/15 0257 12/08/15 0436  WBC 11.5* 16.5*  NEUTROABS 7.9*  --   HGB 13.3 13.1  HCT 39.2* 39.7*  MCV 91.3 92.7  PLT 149* 138*   Cardiac Enzymes:  Recent Labs Lab 12/07/15 0257  TROPONINI 0.03*   CBG:  Recent Labs Lab 12/08/15 0013 12/08/15 0534 12/08/15 0535 12/08/15 0728 12/08/15 1126  GLUCAP 269* 320* 328* 331* 451*    Recent Results (from the past 240 hour(s))  Culture, blood (routine x 2)  Status: None (Preliminary result)   Collection Time: 12/07/15  3:22 AM  Result Value Ref Range Status   Specimen Description BLOOD RIGHT ARM  Final   Special Requests BOTTLES DRAWN AEROBIC AND ANAEROBIC  Final   Culture NO GROWTH 1 DAY  Final   Report Status PENDING  Incomplete  Culture, blood (routine x 2)     Status: None (Preliminary result)   Collection Time: 12/07/15  3:22 AM  Result Value Ref Range Status   Specimen Description BLOOD LEFT ARM  Final   Special Requests BOTTLES DRAWN AEROBIC AND ANAEROBIC  Final   Culture NO GROWTH 1 DAY  Final   Report Status PENDING  Incomplete     Studies: Dg Chest Port 1 View  Result  Date: 12/07/2015 CLINICAL DATA:  Shortness of breath for several days, productive cough. History CHF and COPD. EXAM: PORTABLE CHEST 1 VIEW COMPARISON:  Chest radiograph Sep 25, 2015 FINDINGS: Cardiac silhouette is moderately enlarged, unchanged. Status post median sternotomy. Mild chronic bronchitic changes without pleural effusion or focal consolidation. No pneumothorax. Status post median sternotomy with fractured most caudal well. Single lead LEFT cardiac pacemaker in situ. Soft tissue planes and included osseous structures are unchanged, osteopenia. Old LEFT posterior rib fractures. IMPRESSION: Stable cardiomegaly and chronic bronchitic changes. Electronically Signed   By: Awilda Metro M.D.   On: 12/07/2015 03:36    Scheduled Meds: . [START ON 12/09/2015] azithromycin  250 mg Oral Daily  . budesonide (PULMICORT) nebulizer solution  0.25 mg Nebulization BID  . carbidopa-levodopa  1 tablet Oral TID  . cefTRIAXone (ROCEPHIN)  IV  1 g Intravenous Q24H  . furosemide  40 mg Oral BH-q7a  . guaiFENesin  600 mg Oral BID  . insulin aspart  0-20 Units Subcutaneous TID WC  . insulin aspart  0-5 Units Subcutaneous QHS  . insulin aspart  4 Units Subcutaneous TID WC  . insulin glargine  20 Units Subcutaneous Daily  . ipratropium-albuterol  3 mL Nebulization Q6H  . linagliptin  5 mg Oral Daily  . lisinopril  40 mg Oral QHS  . loratadine  10 mg Oral Daily  . methylPREDNISolone (SOLU-MEDROL) injection  40 mg Intravenous Q6H  . metoprolol succinate  25 mg Oral QPM  . sodium chloride flush  3 mL Intravenous Q12H  . warfarin  3 mg Oral Q T,Th,Sat-1800  . warfarin  5 mg Oral Q M,W,F,Su-1800  . Warfarin - Pharmacist Dosing Inpatient   Does not apply q1800    Assessment/Plan:  1. Acute COPD exacerbation. Decrease IV Medrol 40 mg IV every 12 hours secondary to high sugars. Add budesonide nebulizers. Continue budesonide. Patient was started on ceftriaxone and I will add Zithromax. 2. Acute on chronic  respiratory failure. Patient currently on 3 L of oxygen. Baseline is 2 L. 3. Chronic atrial fibrillation. Therapeutic on Coumadin. Rate controlled. 4. History of systolic congestive heart failure. No signs of heart failure on this hospital course. Continue usual medications. 5. Type 2 diabetes mellitus. Hypoglycemia at home. Patient on sliding scale and added short acting insulin prior to meals. I will check a hemoglobin A1c. Patient takes a large dose of glipizide at home which may be contributing to his at-home hypoglycemia. 6. Essential hypertension on Toprol  Code Status:     Code Status Orders        Start     Ordered   12/07/15 0628  Do not attempt resuscitation (DNR)  Continuous    Question Answer Comment  In the event of cardiac or respiratory ARREST Do not call a "code blue"   In the event of cardiac or respiratory ARREST Do not perform Intubation, CPR, defibrillation or ACLS   In the event of cardiac or respiratory ARREST Use medication by any route, position, wound care, and other measures to relive pain and suffering. May use oxygen, suction and manual treatment of airway obstruction as needed for comfort.      12/07/15 4098    Code Status History    Date Active Date Inactive Code Status Order ID Comments User Context   09/25/2015  1:18 AM 09/28/2015  8:53 PM DNR [id number]  Oralia Manis, MD Inpatient   08/07/2015  7:50 PM 08/09/2015  3:23 PM DNR [id number]  Darrol Jump, PA-C Inpatient   08/03/2015  4:58 PM 08/07/2015  7:50 PM DNR [id number]  Houston Siren, MD ED   02/25/2015  7:56 PM 03/01/2015  7:21 PM Full Code [id number]  Gale Journey, MD ED    Advance Directive Documentation   Flowsheet Row Most Recent Value  Type of Advance Directive  Healthcare Power of Attorney  Pre-existing out of facility DNR order (yellow form or pink MOST form)  No data  "MOST" Form in Place?  No data     Family Communication: Wife at the bedside Disposition Plan: Home once moving better  air  Antibiotics:  Rocephin  Zithromax  Time spent: 25 minutes  Alford Highland  [employer]

## 2015-12-08 NOTE — Progress Notes (Signed)
Assumed care from Latonya RN at or around 1530 

## 2015-12-09 ENCOUNTER — Inpatient Hospital Stay: Payer: Medicare Other

## 2015-12-09 LAB — GLUCOSE, CAPILLARY
GLUCOSE-CAPILLARY: 281 mg/dL — AB (ref 65–99)
GLUCOSE-CAPILLARY: 322 mg/dL — AB (ref 65–99)
Glucose-Capillary: 263 mg/dL — ABNORMAL HIGH (ref 65–99)
Glucose-Capillary: 277 mg/dL — ABNORMAL HIGH (ref 65–99)
Glucose-Capillary: 258 mg/dL — ABNORMAL HIGH (ref 65–99)
Glucose-Capillary: 255 mg/dL — ABNORMAL HIGH (ref 65–99)

## 2015-12-09 LAB — PROTIME-INR
INR: 3.29
PROTHROMBIN TIME: 34.2 s — AB (ref 11.4–15.2)

## 2015-12-09 MED ORDER — POLYETHYLENE GLYCOL 3350 17 G PO PACK
17.0000 g | PACK | Freq: Every day | ORAL | Status: DC
  Administered 2015-12-09 – 2015-12-11 (×2): 17 g via ORAL
  Filled 2015-12-09 (×3): qty 1

## 2015-12-09 MED ORDER — GLIPIZIDE ER 2.5 MG PO TB24
5.0000 mg | ORAL_TABLET | Freq: Every day | ORAL | Status: DC
  Administered 2015-12-09 – 2015-12-11 (×2): 5 mg via ORAL
  Filled 2015-12-09 (×3): qty 2

## 2015-12-09 NOTE — Progress Notes (Signed)
Patient ID: Troy [name], male   DOB: 23-Apr-1924, 82 y.o.   MRN: [id number]   [facility] PROGRESS NOTE  Troy [name] AOZ:[id number] DOB: 23-Apr-1924 DOA: 12/07/2015 PCP: Barbette ReichmannHANDE,VISHWANATH, MD  HPI/Subjective: Patient feeling a little bit better than yesterday. Still with some shortness of breath and cough. Patients iv is hurting and he has very small veins.   Objective: Vitals:   12/09/15 0425 12/09/15 0800  BP: (!) 135/45 136/70  Pulse: 70 74  Resp: 20 16  Temp: 98.6 F (37 C) 97.7 F (36.5 C)    Filed Weights   12/07/15 0254  Weight: 104.3 kg (230 lb)    ROS: Review of Systems  Constitutional: Negative for chills and fever.  Eyes: Negative for blurred vision.  Respiratory: Positive for cough, shortness of breath and wheezing.   Cardiovascular: Negative for chest pain.  Gastrointestinal: Positive for constipation. Negative for abdominal pain, diarrhea, nausea and vomiting.  Genitourinary: Negative for dysuria.  Musculoskeletal: Negative for joint pain.  Neurological: Negative for dizziness and headaches.   Exam: Physical Exam  Constitutional: He is oriented to person, place, and time.  HENT:  Nose: No mucosal edema.  Mouth/Throat: No oropharyngeal exudate or posterior oropharyngeal edema.  Eyes: Conjunctivae, EOM and lids are normal. Pupils are equal, round, and reactive to light.  Neck: No JVD present. Carotid bruit is not present. No edema present. No thyroid mass and no thyromegaly present.  Cardiovascular: S1 normal and S2 normal.  Exam reveals no gallop.   No murmur heard. Pulses:      Dorsalis pedis pulses are 2+ on the right side, and 2+ on the left side.  Respiratory: No respiratory distress. He has decreased breath sounds in the right middle field, the right lower field, the left middle field and the left lower field. He has wheezes in the right middle field, the right lower field and the left lower field. He has no rhonchi. He has no rales.  GI:  Soft. Bowel sounds are normal. There is no tenderness.  Musculoskeletal:       Right ankle: He exhibits swelling.       Left ankle: He exhibits swelling.  Lymphadenopathy:    He has no cervical adenopathy.  Neurological: He is alert and oriented to person, place, and time. No cranial nerve deficit.  Skin: Skin is warm. No rash noted. Nails show no clubbing.  Psychiatric: He has a normal mood and affect.      Data Reviewed: Basic Metabolic Panel:  Recent Labs Lab 12/07/15 0257 12/08/15 0436  NA 141 138  K 4.1 4.7  CL 104 102  CO2 30 27  GLUCOSE 67 308*  BUN 33* 38*  CREATININE 1.18 1.18  CALCIUM 8.8* 8.9   Liver Function Tests:  Recent Labs Lab 12/07/15 0257  AST 20  ALT 8*  ALKPHOS 64  BILITOT 0.9  PROT 6.9  ALBUMIN 3.6   CBC:  Recent Labs Lab 12/07/15 0257 12/08/15 0436  WBC 11.5* 16.5*  NEUTROABS 7.9*  --   HGB 13.3 13.1  HCT 39.2* 39.7*  MCV 91.3 92.7  PLT 149* 138*   Cardiac Enzymes:  Recent Labs Lab 12/07/15 0257  TROPONINI 0.03*   CBG:  Recent Labs Lab 12/08/15 1649 12/08/15 2011 12/09/15 0157 12/09/15 0423 12/09/15 0730  GLUCAP 357* 308* 263* 277* 258*    Recent Results (from the past 240 hour(s))  Culture, blood (routine x 2)     Status: None (Preliminary result)   Collection Time:  12/07/15  3:22 AM  Result Value Ref Range Status   Specimen Description BLOOD RIGHT ARM  Final   Special Requests BOTTLES DRAWN AEROBIC AND ANAEROBIC  Final   Culture NO GROWTH 1 DAY  Final   Report Status PENDING  Incomplete  Culture, blood (routine x 2)     Status: None (Preliminary result)   Collection Time: 12/07/15  3:22 AM  Result Value Ref Range Status   Specimen Description BLOOD LEFT ARM  Final   Special Requests BOTTLES DRAWN AEROBIC AND ANAEROBIC  Final   Culture NO GROWTH 1 DAY  Final   Report Status PENDING  Incomplete      Scheduled Meds: . azithromycin  250 mg Oral Daily  . budesonide (PULMICORT) nebulizer solution   0.25 mg Nebulization BID  . carbidopa-levodopa  1 tablet Oral TID  . cefTRIAXone (ROCEPHIN)  IV  1 g Intravenous Q24H  . furosemide  40 mg Oral BH-q7a  . guaiFENesin  600 mg Oral BID  . insulin aspart  0-20 Units Subcutaneous TID WC  . insulin aspart  0-5 Units Subcutaneous QHS  . insulin aspart  4 Units Subcutaneous TID WC  . insulin glargine  20 Units Subcutaneous Daily  . ipratropium-albuterol  3 mL Nebulization Q6H  . linagliptin  5 mg Oral Daily  . lisinopril  40 mg Oral QHS  . loratadine  10 mg Oral Daily  . methylPREDNISolone (SOLU-MEDROL) injection  40 mg Intravenous Q6H  . metoprolol succinate  25 mg Oral QPM  . sodium chloride flush  3 mL Intravenous Q12H  . Warfarin - Pharmacist Dosing Inpatient   Does not apply q1800    Assessment/Plan:  1. Acute COPD exacerbation. Continue IV SoluMedrol 40 mg IV every 6 hours. Added budesonide nebulizers. Continue Duoneb. Patient was started on ceftriaxone and Zithromax. If unable to get iv, I will order a picc line. 2. Acute on chronic respiratory failure. Patient Back to baseline oxygen. 3. Chronic atrial fibrillation. INR slightly high on Coumadin. Pharmacy dosing Coumadin. Rate controlled. 4. History of systolic congestive heart failure. No signs of heart failure on this hospital course. Continue usual medications. 5. Type 2 diabetes mellitus. Hypoglycemia at home. Patient on sliding scale and added short acting insulin prior to meals. Hemoglobin A1c 8.4. Patient takes a large dose of glipizide at home which may be contributing to his at-home hypoglycemia. Can add back 5 mg of glipizide. 6. Essential hypertension on Toprol 7. Constipation, add miralax  Code Status:     Code Status Orders        Start     Ordered   12/07/15 0628  Do not attempt resuscitation (DNR)  Continuous    Question Answer Comment  In the event of cardiac or respiratory ARREST Do not call a "code blue"   In the event of cardiac or respiratory ARREST Do not  perform Intubation, CPR, defibrillation or ACLS   In the event of cardiac or respiratory ARREST Use medication by any route, position, wound care, and other measures to relive pain and suffering. May use oxygen, suction and manual treatment of airway obstruction as needed for comfort.      12/07/15 1610    Code Status History    Date Active Date Inactive Code Status Order ID Comments User Context   09/25/2015  1:18 AM 09/28/2015  8:53 PM DNR [id number]  Oralia Manis, MD Inpatient   08/07/2015  7:50 PM 08/09/2015  3:23 PM DNR [id number]  Joline Salt  Barrett, PA-C Inpatient   08/03/2015  4:58 PM 08/07/2015  7:50 PM DNR [id number]  Houston Siren, MD ED   02/25/2015  7:56 PM 03/01/2015  7:21 PM Full Code [id number]  Gale Journey, MD ED    Advance Directive Documentation   Flowsheet Row Most Recent Value  Type of Advance Directive  Healthcare Power of Attorney  Pre-existing out of facility DNR order (yellow form or pink MOST form)  No data  "MOST" Form in Place?  No data     Family Communication: Son at the bedside Disposition Plan: Home once moving better air  Antibiotics:  Rocephin  Zithromax  Time spent: 25 minutes  Alford Highland  [employer]

## 2015-12-09 NOTE — Progress Notes (Signed)
ANTICOAGULATION CONSULT NOTE - Follow Up  Pharmacy Consult for Warfarin Indication: atrial fibrillation  No Known Allergies  Patient Measurements: Height: 5\' 9"  (175.3 cm) Weight: 230 lb (104.3 kg) IBW/kg (Calculated) : 70.7  Vital Signs: Temp: 97.7 F (36.5 C) (08/06 0800) Temp Source: Oral (08/06 0800) BP: 136/70 (08/06 0800) Pulse Rate: 74 (08/06 0800)  Labs:  Recent Labs  12/07/15 0257 12/07/15 0322 12/08/15 0436 12/09/15 0409  HGB 13.3  --  13.1  --   HCT 39.2*  --  39.7*  --   PLT 149*  --  138*  --   LABPROT  --  23.0* 30.3* 34.2*  INR  --  2.00 2.83 3.29  CREATININE 1.18  --  1.18  --   TROPONINI 0.03*  --   --   --     Estimated Creatinine Clearance: 49.5 mL/min (by C-G formula based on SCr of 1.18 mg/dL).   Medical History: Past Medical History:  Diagnosis Date  . Chronic atrial fibrillation (HCC)    a. on Couamdin; b. CHADS2VASc at least 7 (CHF, HTN, age x 2, DM, stroke x 2); c. s/p MAZE 1999  . Chronic systolic CHF (congestive heart failure) (HCC)    a. echo 08/01/2015: EF of 45%, mild LVH, mitral valve with annular calcification, mitral valve partially mobile  . CKD (chronic kidney disease), stage III   . COPD (chronic obstructive pulmonary disease) (HCC)   . Diabetes mellitus without complication (HCC)   . Hypertension   . Hypothyroidism   . Mitral valve prolapse    a. s/p mitral valve repair 1999  . Normal coronary arteries    a. by cardiac cath 1999  . Parkinson's disease (HCC)   . Stroke (HCC)   . TIA (transient ischemic attack)   . UTI (lower urinary tract infection)     Medications:  Scheduled:  . azithromycin  250 mg Oral Daily  . budesonide (PULMICORT) nebulizer solution  0.25 mg Nebulization BID  . carbidopa-levodopa  1 tablet Oral TID  . cefTRIAXone (ROCEPHIN)  IV  1 g Intravenous Q24H  . furosemide  40 mg Oral BH-q7a  . guaiFENesin  600 mg Oral BID  . insulin aspart  0-20 Units Subcutaneous TID WC  . insulin aspart  0-5  Units Subcutaneous QHS  . insulin aspart  4 Units Subcutaneous TID WC  . insulin glargine  20 Units Subcutaneous Daily  . ipratropium-albuterol  3 mL Nebulization Q6H  . linagliptin  5 mg Oral Daily  . lisinopril  40 mg Oral QHS  . loratadine  10 mg Oral Daily  . methylPREDNISolone (SOLU-MEDROL) injection  40 mg Intravenous Q6H  . metoprolol succinate  25 mg Oral QPM  . sodium chloride flush  3 mL Intravenous Q12H  . Warfarin - Pharmacist Dosing Inpatient   Does not apply q1800    Assessment: 82 yo male on Warfarin for Afib.  Home dose:  Warfarin 3 mg  T,Th,Sat and  5 mg M, W, F, Sun.  8/6  INR 3.29  Goal of Therapy:  INR 2-3 Monitor platelets by anticoagulation protocol: Yes   Plan:  INR supratherapeutic.  Will discontinue current warfarin orders.  Follow up INR in am to determine further dosing.  Stormy CardKatsoudas,Aristides Luckey K, RPh Clinical Pharmacist 12/09/2015,9:55 AM

## 2015-12-09 NOTE — Progress Notes (Signed)
Patient respiratory wise has improved slightly. Unable to maintain peripheral iv access. Double lumen picc line placed right upper arm. Large BM today. Coumadin held today d/t pt INR of 3.5 will reassess in the AM after repeat INR.

## 2015-12-10 ENCOUNTER — Encounter: Payer: Self-pay | Admitting: Orthopedic Surgery

## 2015-12-10 LAB — GLUCOSE, CAPILLARY
GLUCOSE-CAPILLARY: 321 mg/dL — AB (ref 65–99)
GLUCOSE-CAPILLARY: 334 mg/dL — AB (ref 65–99)
GLUCOSE-CAPILLARY: 382 mg/dL — AB (ref 65–99)
GLUCOSE-CAPILLARY: 247 mg/dL — AB (ref 65–99)
Glucose-Capillary: 244 mg/dL — ABNORMAL HIGH (ref 65–99)
Glucose-Capillary: 325 mg/dL — ABNORMAL HIGH (ref 65–99)

## 2015-12-10 LAB — PROTIME-INR
INR: 2.93
Prothrombin Time: 31.2 seconds — ABNORMAL HIGH (ref 11.4–15.2)

## 2015-12-10 MED ORDER — METHYLPREDNISOLONE SODIUM SUCC 40 MG IJ SOLR
40.0000 mg | Freq: Three times a day (TID) | INTRAMUSCULAR | Status: DC
  Administered 2015-12-11 (×2): 40 mg via INTRAVENOUS
  Filled 2015-12-10 (×2): qty 1

## 2015-12-10 MED ORDER — INSULIN ASPART 100 UNIT/ML ~~LOC~~ SOLN
6.0000 [IU] | Freq: Three times a day (TID) | SUBCUTANEOUS | Status: DC
  Administered 2015-12-10 – 2015-12-11 (×2): 6 [IU] via SUBCUTANEOUS
  Filled 2015-12-10 (×2): qty 6

## 2015-12-10 MED ORDER — IPRATROPIUM-ALBUTEROL 0.5-2.5 (3) MG/3ML IN SOLN
3.0000 mL | Freq: Three times a day (TID) | RESPIRATORY_TRACT | Status: DC
  Administered 2015-12-10: 3 mL via RESPIRATORY_TRACT
  Filled 2015-12-10: qty 3

## 2015-12-10 MED ORDER — INSULIN GLARGINE 100 UNIT/ML ~~LOC~~ SOLN
30.0000 [IU] | Freq: Every day | SUBCUTANEOUS | Status: DC
  Administered 2015-12-11: 30 [IU] via SUBCUTANEOUS
  Filled 2015-12-10: qty 0.3

## 2015-12-10 MED ORDER — IPRATROPIUM-ALBUTEROL 0.5-2.5 (3) MG/3ML IN SOLN: 3.0000 mL | Freq: Four times a day (QID) | RESPIRATORY_TRACT | Status: DC | PRN

## 2015-12-10 MED ORDER — WARFARIN SODIUM 2.5 MG PO TABS
2.5000 mg | ORAL_TABLET | Freq: Once | ORAL | Status: AC
  Administered 2015-12-10: 2.5 mg via ORAL
  Filled 2015-12-10: qty 1

## 2015-12-10 NOTE — Progress Notes (Signed)
   12/10/15 1025  Clinical Encounter Type  Visited With Patient  Visit Type Initial  Consult/Referral To Chaplain  Spiritual Encounters  Spiritual Needs Prayer;Emotional  Stress Factors  Patient Stress Factors Exhausted;Health changes  The pt is in good spirits, but is concerned about his ability to get in good enough health to be able to go home.  We had a good conversation about what gives him hope and also his experience in [place].

## 2015-12-10 NOTE — Progress Notes (Signed)
ANTICOAGULATION CONSULT NOTE - Follow Up  Pharmacy Consult for Warfarin Indication: atrial fibrillation  No Known Allergies  Patient Measurements: Height: 5\' 9"  (175.3 cm) Weight: 230 lb (104.3 kg) IBW/kg (Calculated) : 70.7  Vital Signs: Temp: 97.5 F (36.4 C) (08/07 0737) Temp Source: Oral (08/07 0737) BP: 158/81 (08/07 0737) Pulse Rate: 72 (08/07 0737)  Labs:  Recent Labs  12/08/15 0436 12/09/15 0409 12/10/15 0355  HGB 13.1  --   --   HCT 39.7*  --   --   PLT 138*  --   --   LABPROT 30.3* 34.2* 31.2*  INR 2.83 3.29 2.93  CREATININE 1.18  --   --     Estimated Creatinine Clearance: 49.5 mL/min (by C-G formula based on SCr of 1.18 mg/dL).  Assessment: 82 yo male on Warfarin for Afib.  Home dose:  Warfarin 3 mg  T,Th,Sat and  5 mg M, W, F, Sun.  8/4  INR 2.00 - warfarin 5 mg 8/5  INR 2.83 - warfarin 3 mg 8/6  INR 3.29 - no warfarin 8/7  INR 2.93  Goal of Therapy:  INR 2-3 Monitor platelets by anticoagulation protocol: Yes   Plan:  INR decreased to within goal range after holding warfarin yesterday but borderline. Pt also on abx that may interact with warfarin.  Will give lower than usual home dose - warfarin 2.5 mg PO for today Follow up INR in am to determine further dosing. CBC in AM (and every 3 days) per policy.   Pharmacy will continue to follow.   Marty HeckWang, Yoali Conry L, RPh Clinical Pharmacist 12/10/2015,10:21 AM

## 2015-12-10 NOTE — Progress Notes (Signed)
Inpatient Diabetes Program Recommendations  AACE/ADA: New Consensus Statement on Inpatient Glycemic Control (2015)  Target Ranges:  Prepandial:   less than 140 mg/dL      Peak postprandial:   less than 180 mg/dL (1-2 hours)      Critically ill patients:  140 - 180 mg/dL   Results for Troy [name], Troy [name] (MRN [id number]) as of 12/10/2015 08:46  Ref. Range 12/09/2015 07:30 12/09/2015 11:03 12/09/2015 16:26 12/09/2015 19:58  Glucose-Capillary Latest Ref Range: 65 - 99 mg/dL 409258 (H) 811281 (H) 914322 (H) 255 (H)   Results for Troy [name], Troy [name] (MRN [id number]) as of 12/10/2015 08:46  Ref. Range 12/10/2015 07:54  Glucose-Capillary Latest Ref Range: 65 - 99 mg/dL 086321 (H)    Home DM Meds: Glipizide 10 mg am/ 5 mg pm       U-500 concentrated Insulin 4 units am + 6 units pm        Onglyza 5 mg daily  Current Insulin Orders: Lantus 20 units daily      Novolog Resistant Correction Scale/ SSI (0-20 units) TID AC + HS      Novolog 4 units tidwc      Glipizide 5 mg daily      Tradjenta 5 mg daily      -Patient currently getting IV Solumedrol 40 mg Q6 hours.  -Eating 100% of meals per documentation.  -Patient received a total of 48 units Novolog yesterday (08/06) both SSI + Meal Coverage.    MD- Please consider the following in-hospital insulin adjustments while patient getting IV Steroids:  1. Increase Lantus to 30 units daily (50% increase)  2. Increase Novolog Meal Coverage to: Novolog 6 units tid with meals (hold if pt eats <50% of meal)      --Will follow patient during hospitalization--  Ambrose FinlandJeannine Johnston Kim Oki RN, MSN, CDE Diabetes Coordinator Inpatient Glycemic Control Team Team Pager: [phone] (8a-5p)

## 2015-12-10 NOTE — Progress Notes (Signed)
Physical Therapy Treatment Patient Details Name: Troy BurnRobert S Berrios MRN: [id number] DOB: 12-14-1923 Today's Date: 12/10/2015    History of Present Illness Patient admitted with acute COPD exacerbation. Patient has h/o Parkinson's, CVA, COPD, CHF uses 2 liters O2 at home.     PT Comments    PT in bed ready for session.  To edge with rail,  Stood and was able to ambulate 260' with walker and min vc's to improve walker position with gait.  He was able to ambulate up/down 4 stairs with bilateral rails with ease.  [facility] but has a walker.  Stair education given and verbalized understanding.  Participated in exercises as described below.  Pt motivated to increase independence.  3lpm O2.    Follow Up Recommendations  No PT follow up     Equipment Recommendations       Recommendations for Other Services       Precautions / Restrictions Precautions Precautions: Fall Restrictions Weight Bearing Restrictions: No    Mobility  Bed Mobility Overal bed mobility: Modified Independent             General bed mobility comments: Pt able to get himself up to sitting EOB with good effort and overall safety.    Transfers Overall transfer level: Modified independent Equipment used: Rolling walker (2 wheeled)             General transfer comment: needed verbal cues to get to edge of chair to stand  Ambulation/Gait Ambulation/Gait assistance: Supervision Ambulation Distance (Feet): 260 Feet Assistive device: Rolling walker (2 wheeled) Gait Pattern/deviations: Step-through pattern   Gait velocity interpretation: Below normal speed for age/gender General Gait Details: verbal cues at times to keep walker closer and to slow pace.   Stairs            Wheelchair Mobility    Modified Rankin (Stroke Patients Only)       Balance Overall balance assessment: Independent                                  Cognition  Arousal/Alertness: Awake/alert Behavior During Therapy: WFL for tasks assessed/performed Overall Cognitive Status: Within Functional Limits for tasks assessed                      Exercises General Exercises - Lower Extremity Ankle Circles/Pumps: AROM;Both;10 reps;Seated Long Arc Quad: AROM;Both;20 reps;Seated Hip ABduction/ADduction: AROM;Both;10 reps;Standing Straight Leg Raises: AROM;Both;10 reps;Standing Hip Flexion/Marching: AROM;Both;10 reps;Seated;Standing Toe Raises: AROM;Both;10 reps;Standing Heel Raises: AROM;Both;10 reps;Standing    General Comments        Pertinent Vitals/Pain Pain Assessment: No/denies pain    Home Living                      Prior Function            PT Goals (current goals can now be found in the care plan section) Acute Rehab PT Goals Patient Stated Goal: go home Progress towards PT goals: Progressing toward goals    Frequency  Min 2X/week    PT Plan Current plan remains appropriate    Co-evaluation             End of Session Equipment Utilized During Treatment: Gait belt;Oxygen Activity Tolerance: Patient tolerated treatment well Patient left: with chair alarm set;with call bell/phone within reach     Time: [phone]  PT Time Calculation (min) (ACUTE ONLY): 23 min  Charges:  $Gait Training: 8-22 mins $Therapeutic Exercise: 8-22 mins                    G Codes:      Danielle Dess, PTA 12/10/15, 10:37 AM

## 2015-12-10 NOTE — Progress Notes (Signed)
Patient ID: Troy [name], male   DOB: [birth year], 82 y.o.   MRN: [id number]   [employer] PROGRESS NOTE  BEARETT PORCARO WUJ:[id number] DOB: 06/05/1923 DOA: 12/07/2015 PCP: Barbette Reichmann, MD  HPI/Subjective: Patient feels like he is breathing a little bit better. Feels like he is breathing 75% better. As per family he is coughing up a lot of phlegm now.   Objective: Vitals:   12/10/15 0353 12/10/15 0737  BP: (!) 152/86 (!) 158/81  Pulse: 64 72  Resp: 20 18  Temp: 98 F (36.7 C) 97.5 F (36.4 C)    Filed Weights   12/07/15 0254  Weight: 104.3 kg (230 lb)    ROS: Review of Systems  Constitutional: Negative for chills and fever.  Eyes: Negative for blurred vision.  Respiratory: Positive for cough and shortness of breath.   Cardiovascular: Negative for chest pain.  Gastrointestinal: Negative for abdominal pain, constipation, diarrhea, nausea and vomiting.  Genitourinary: Negative for dysuria.  Musculoskeletal: Negative for joint pain.  Neurological: Negative for dizziness and headaches.   Exam: Physical Exam  Constitutional: He is oriented to person, place, and time.  HENT:  Nose: No mucosal edema.  Mouth/Throat: No oropharyngeal exudate or posterior oropharyngeal edema.  Eyes: Conjunctivae, EOM and lids are normal. Pupils are equal, round, and reactive to light.  Neck: No JVD present. Carotid bruit is not present. No edema present. No thyroid mass and no thyromegaly present.  Cardiovascular: S1 normal and S2 normal.  Exam reveals no gallop.   No murmur heard. Pulses:      Dorsalis pedis pulses are 2+ on the right side, and 2+ on the left side.  Respiratory: No respiratory distress. He has decreased breath sounds in the right lower field and the left lower field. He has wheezes in the right lower field. He has no rhonchi. He has no rales.  GI: Soft. Bowel sounds are normal. There is no tenderness.  Musculoskeletal:       Right ankle: He exhibits swelling.      Left ankle: He exhibits swelling.  Lymphadenopathy:    He has no cervical adenopathy.  Neurological: He is alert and oriented to person, place, and time. No cranial nerve deficit.  Skin: Skin is warm. No rash noted. Nails show no clubbing.  Psychiatric: He has a normal mood and affect.      Data Reviewed: Basic Metabolic Panel:  Recent Labs Lab 12/07/15 0257 12/08/15 0436  NA 141 138  K 4.1 4.7  CL 104 102  CO2 30 27  GLUCOSE 67 308*  BUN 33* 38*  CREATININE 1.18 1.18  CALCIUM 8.8* 8.9   Liver Function Tests:  Recent Labs Lab 12/07/15 0257  AST 20  ALT 8*  ALKPHOS 64  BILITOT 0.9  PROT 6.9  ALBUMIN 3.6   CBC:  Recent Labs Lab 12/07/15 0257 12/08/15 0436  WBC 11.5* 16.5*  NEUTROABS 7.9*  --   HGB 13.3 13.1  HCT 39.2* 39.7*  MCV 91.3 92.7  PLT 149* 138*   Cardiac Enzymes:  Recent Labs Lab 12/07/15 0257  TROPONINI 0.03*   CBG:  Recent Labs Lab 12/09/15 1958 12/10/15 0348 12/10/15 0754 12/10/15 0942 12/10/15 1152  GLUCAP 255* 244* 321* 334* 382*    Recent Results (from the past 240 hour(s))  Culture, blood (routine x 2)     Status: None (Preliminary result)   Collection Time: 12/07/15  3:22 AM  Result Value Ref Range Status   Specimen Description BLOOD RIGHT ARM  Final   Special Requests BOTTLES DRAWN AEROBIC AND ANAEROBIC  Final   Culture NO GROWTH 3 DAYS  Final   Report Status PENDING  Incomplete  Culture, blood (routine x 2)     Status: None (Preliminary result)   Collection Time: 12/07/15  3:22 AM  Result Value Ref Range Status   Specimen Description BLOOD LEFT ARM  Final   Special Requests BOTTLES DRAWN AEROBIC AND ANAEROBIC  Final   Culture NO GROWTH 3 DAYS  Final   Report Status PENDING  Incomplete      Scheduled Meds: . azithromycin  250 mg Oral Daily  . budesonide (PULMICORT) nebulizer solution  0.25 mg Nebulization BID  . carbidopa-levodopa  1 tablet Oral TID  . cefTRIAXone (ROCEPHIN)  IV  1 g Intravenous  Q24H  . furosemide  40 mg Oral BH-q7a  . glipiZIDE  5 mg Oral Q breakfast  . guaiFENesin  600 mg Oral BID  . insulin aspart  0-20 Units Subcutaneous TID WC  . insulin aspart  0-5 Units Subcutaneous QHS  . insulin aspart  6 Units Subcutaneous TID WC  . [START ON 12/11/2015] insulin glargine  30 Units Subcutaneous Daily  . linagliptin  5 mg Oral Daily  . lisinopril  40 mg Oral QHS  . loratadine  10 mg Oral Daily  . methylPREDNISolone (SOLU-MEDROL) injection  40 mg Intravenous Q6H  . metoprolol succinate  25 mg Oral QPM  . polyethylene glycol  17 g Oral Daily  . sodium chloride flush  3 mL Intravenous Q12H  . warfarin  2.5 mg Oral ONCE-1800  . Warfarin - Pharmacist Dosing Inpatient   Does not apply q1800    Assessment/Plan:  1. Acute COPD exacerbation. Decrease IV SoluMedrol 40 mg IV every 8 hours. Added budesonide nebulizers. Continue Duoneb. Patient was started on ceftriaxone and Zithromax.  2. Acute on chronic respiratory failure. Patient Back to baseline oxygen. 3. Chronic atrial fibrillation. On Coumadin. Pharmacy dosing Coumadin. Rate controlled. 4. History of systolic congestive heart failure. No signs of heart failure on this hospital course. Continue usual medications. 5. Type 2 diabetes mellitus. Hypoglycemia at home. Lantus insulin increased to 30 units daily. Short acting insulin prior to meals with sliding scale.. Hemoglobin A1c 8.4. Patient takes a large dose of glipizide at home which may be contributing to his at-home hypoglycemia. Added back 5 mg of glipizide. 6. Essential hypertension on Toprol 7. Constipation, add miralax  Code Status:     Code Status Orders        Start     Ordered   12/07/15 0628  Do not attempt resuscitation (DNR)  Continuous    Question Answer Comment  In the event of cardiac or respiratory ARREST Do not call a "code blue"   In the event of cardiac or respiratory ARREST Do not perform Intubation, CPR, defibrillation or ACLS   In the event of  cardiac or respiratory ARREST Use medication by any route, position, wound care, and other measures to relive pain and suffering. May use oxygen, suction and manual treatment of airway obstruction as needed for comfort.      12/07/15 1610    Code Status History    Date Active Date Inactive Code Status Order ID Comments User Context   09/25/2015  1:18 AM 09/28/2015  8:53 PM DNR [id number]  Oralia Manis, MD Inpatient   08/07/2015  7:50 PM 08/09/2015  3:23 PM DNR [id number]  Darrol Jump, PA-C Inpatient   08/03/2015  4:58 PM 08/07/2015  7:50 PM DNR [id number]  Houston SirenVivek J Sainani, MD ED   02/25/2015  7:56 PM 03/01/2015  7:21 PM Full Code [id number]  Gale Journeyatherine P Walsh, MD ED    Advance Directive Documentation   Flowsheet Row Most Recent Value  Type of Advance Directive  Healthcare Power of Attorney  Pre-existing out of facility DNR order (yellow form or pink MOST form)  No data  "MOST" Form in Place?  No data     Family Communication: Wife and caregiver at bedside Disposition Plan: Home once moving better air. Earliest potential would be tomorrow.  Antibiotics:  Rocephin  Zithromax  Time spent: 24 minutes  Alford HighlandWIETING, Tripp Goins  [employer]

## 2015-12-11 LAB — GLUCOSE, CAPILLARY
GLUCOSE-CAPILLARY: 404 mg/dL — AB (ref 65–99)
GLUCOSE-CAPILLARY: 345 mg/dL — AB (ref 65–99)
Glucose-Capillary: 190 mg/dL — ABNORMAL HIGH (ref 65–99)
Glucose-Capillary: 222 mg/dL — ABNORMAL HIGH (ref 65–99)
Glucose-Capillary: 251 mg/dL — ABNORMAL HIGH (ref 65–99)

## 2015-12-11 LAB — CBC
HEMATOCRIT: 36.8 % — AB (ref 40.0–52.0)
HEMOGLOBIN: 12.6 g/dL — AB (ref 13.0–18.0)
MCH: 30.9 pg (ref 26.0–34.0)
MCHC: 34.2 g/dL (ref 32.0–36.0)
MCV: 90.4 fL (ref 80.0–100.0)
Platelets: 124 10*3/uL — ABNORMAL LOW (ref 150–440)
RBC: 4.07 MIL/uL — ABNORMAL LOW (ref 4.40–5.90)
RDW: 15.4 % — ABNORMAL HIGH (ref 11.5–14.5)
WBC: 13.3 10*3/uL — ABNORMAL HIGH (ref 3.8–10.6)

## 2015-12-11 LAB — PROTIME-INR
INR: 2.38
Prothrombin Time: 26.4 seconds — ABNORMAL HIGH (ref 11.4–15.2)

## 2015-12-11 MED ORDER — PREDNISONE 5 MG PO TABS: ORAL_TABLET | ORAL | 0 refills | Status: DC

## 2015-12-11 MED ORDER — GLIPIZIDE ER 5 MG PO TB24: 5.0000 mg | ORAL_TABLET | Freq: Every day | ORAL | 0 refills | Status: DC

## 2015-12-11 MED ORDER — AZITHROMYCIN 250 MG PO TABS: ORAL_TABLET | ORAL | 0 refills | Status: DC

## 2015-12-11 MED ORDER — INSULIN ASPART 100 UNIT/ML ~~LOC~~ SOLN
25.0000 [IU] | Freq: Once | SUBCUTANEOUS | Status: AC
  Administered 2015-12-11: 25 [IU] via SUBCUTANEOUS

## 2015-12-11 MED ORDER — WARFARIN SODIUM 5 MG PO TABS: 5.0000 mg | ORAL_TABLET | Freq: Once | ORAL | Status: DC

## 2015-12-11 MED ORDER — FLUTICASONE-SALMETEROL 115-21 MCG/ACT IN AERO: 2.0000 | INHALATION_SPRAY | Freq: Two times a day (BID) | RESPIRATORY_TRACT | 0 refills | Status: AC

## 2015-12-11 MED ORDER — INSULIN GLARGINE 100 UNIT/ML ~~LOC~~ SOLN: 24.0000 [IU] | Freq: Every day | SUBCUTANEOUS | 0 refills | Status: DC

## 2015-12-11 MED ORDER — POLYETHYLENE GLYCOL 3350 17 G PO PACK: 17.0000 g | PACK | Freq: Every day | ORAL | 0 refills | Status: DC

## 2015-12-11 NOTE — Progress Notes (Signed)
Pts BG 404, MD [place] notified one time order received for Novolog 25 units.

## 2015-12-11 NOTE — Care Management Note (Signed)
Case Management Note  Patient Details  Name: Troy BurnRobert [name] MRN: [id number] Date of Birth: August 09, 1923  Subjective/Objective:   Spoke with patient and Aid in the room and also discussed care with attending Dr Hilton SinclairWeiting. Patient did very well with PT yesterday and [facility] Patient was recently released from [facility] and has a 24 hour in home care worker. Patient has all necessary DME in the home. No CM needs identified.                  Action/Plan: Anticipated discharge home today with support of family and home care aids.   Expected Discharge Date:                  Expected Discharge Plan:  Home/Self Care  In-House Referral:     Discharge planning Services  CM Consult  Post Acute Care Choice:    Choice offered to:  Spouse  DME Arranged:    DME Agency:     HH Arranged:    HH Agency:   [facility]  Status of Service:  Completed, signed off  If discussed at Long Length of Stay Meetings, dates discussed:    Additional Comments:  [name], [name], RN 12/11/2015, 9:08 AM

## 2015-12-11 NOTE — Discharge Summary (Signed)
[facility] - [place] [facility]   PATIENT NAME: Troy [name]    MR#:  [id number]  DATE OF BIRTH:  1923-10-21  DATE OF ADMISSION:  12/07/2015 ADMITTING PHYSICIAN: Ihor Austin, MD  DATE OF DISCHARGE: 12/11/2015  1:56 PM  PRIMARY CARE PHYSICIAN: Barbette Reichmann, MD    ADMISSION DIAGNOSIS:  SOB (shortness of breath) [R06.02] Hypoxia [R09.02] COPD exacerbation (HCC) [J44.1] Acute on chronic respiratory failure with hypoxia (HCC) [J96.21]  DISCHARGE DIAGNOSIS:  Active Problems:   Acute exacerbation of chronic obstructive pulmonary disease (COPD) (HCC)   SECONDARY DIAGNOSIS:   Past Medical History:  Diagnosis Date  . Chronic atrial fibrillation (HCC)    a. on Couamdin; b. CHADS2VASc at least 7 (CHF, HTN, age x 2, DM, stroke x 2); c. s/p MAZE 1999  . Chronic systolic CHF (congestive heart failure) (HCC)    a. echo 08/01/2015: EF of 45%, mild LVH, mitral valve with annular calcification, mitral valve partially mobile  . CKD (chronic kidney disease), stage III   . COPD (chronic obstructive pulmonary disease) (HCC)   . Diabetes mellitus without complication (HCC)   . Hypertension   . Hypothyroidism   . Legally blind   . Mitral valve prolapse    a. s/p mitral valve repair 1999  . Normal coronary arteries    a. by cardiac cath 1999  . Parkinson's disease (HCC)   . Stroke (HCC)   . TIA (transient ischemic attack)   . UTI (lower urinary tract infection)     HOSPITAL COURSE:   1. Acute COPD exacerbation. Patient was on IV Solu-Medrol and nebulizer treatments during the hospital course. Patient was on ceftriaxone and Zithromax. Since there was no pneumonia seen on chest x-ray prescribe Zithromax upon discharge home. Patient will be given a prednisone taper upon going home. Advair prescribed. Patient and family believe he is back to his baseline breathing. 2. Acute on chronic respiratory failure. Patient was on 3 L of oxygen when coming in and now back to his  baseline 2 L. 3. Chronic atrial fibrillation on Coumadin. Patient is rate controlled and INR is therapeutic. 4. History of systolic congestive heart failure. No signs of heart failure during his hospital course. No changes in his usual medications. 5. Type 2 diabetes mellitus with hypoglycemia at home. Lantus prescribed 24 units. Patient also has short acting insulin. Patient does have hypoglycemia at home and I decreased his glipizide dose. 6. Essential hypertension on Toprol 7. Constipation on MiraLAX   DISCHARGE CONDITIONS:   Satisfactory  CONSULTS OBTAINED:   none  DRUG ALLERGIES:  No Known Allergies  DISCHARGE MEDICATIONS:   Discharge Medication List as of 12/11/2015 11:17 AM    START taking these medications   Details  azithromycin (ZITHROMAX) 250 MG tablet One tablet daily until finished, Print    fluticasone-salmeterol (ADVAIR HFA) 115-21 MCG/ACT inhaler Inhale 2 puffs into the lungs 2 (two) times daily., Starting Tue 12/11/2015, Print    insulin glargine (LANTUS) 100 UNIT/ML injection Inject 0.24 mLs (24 Units total) into the skin daily., Starting Tue 12/11/2015, Print    polyethylene glycol (MIRALAX / GLYCOLAX) packet Take 17 g by mouth daily., Starting Tue 12/11/2015, Print    predniSONE (DELTASONE) 5 MG tablet 6 tablets po day1; 5 tablets po day 2; 4 tablets po day3; 3 tablets po day 4; 2 tablets day5; 1 tablets daily afterwards, Print      CONTINUE these medications which have CHANGED   Details  glipiZIDE (GLUCOTROL XL) 5 MG 24 hr  tablet Take 1 tablet (5 mg total) by mouth daily with breakfast., Starting Tue 12/11/2015, Print      CONTINUE these medications which have NOT CHANGED   Details  albuterol (PROVENTIL HFA) 108 (90 Base) MCG/ACT inhaler Inhale 2 puffs into the lungs every 4 (four) hours as needed for wheezing or shortness of breath., Starting 07/11/2015, Until Discontinued, Print    albuterol (PROVENTIL) (2.5 MG/3ML) 0.083% nebulizer solution Take 2.5 mg by  nebulization every 6 (six) hours as needed for wheezing or shortness of breath., Until Discontinued, Historical Med    carbidopa-levodopa (SINEMET IR) 25-250 MG tablet Take 1 tablet by mouth 3 (three) times daily., Until Discontinued, Historical Med    cetirizine (ZYRTEC) 10 MG tablet Take 10 mg by mouth daily., Until Discontinued, Historical Med    furosemide (LASIX) 40 MG tablet Take 40 mg by mouth every morning. , Until Discontinued, Historical Med    insulin regular human CONCENTRATED (HUMULIN R) 500 UNIT/ML injection Inject 4-6 Units into the skin 2 (two) times daily with a meal. 4 units in the morning and 6 units at bedtime, Historical Med    lisinopril (PRINIVIL,ZESTRIL) 40 MG tablet Take 40 mg by mouth at bedtime. , Until Discontinued, Historical Med    metoprolol succinate (TOPROL-XL) 25 MG 24 hr tablet Take 1 tablet (25 mg total) by mouth every evening. Take with or immediately following a meal., Starting 08/06/2015, Until Discontinued, Normal    saxagliptin HCl (ONGLYZA) 2.5 MG TABS tablet Take 5 mg by mouth every evening., Until Discontinued, Historical Med    !! warfarin (COUMADIN) 3 MG tablet Take 3 mg by mouth every evening. Pt takes on Tuesday, Thursday, and Saturday., Until Discontinued, Historical Med    !! warfarin (COUMADIN) 5 MG tablet Take 5 mg by mouth daily. Takes Monday, Wednesday,Friday and Sunday, Historical Med     !! - Potential duplicate medications found. Please discuss with provider.    STOP taking these medications     glipiZIDE (GLUCOTROL) 10 MG tablet      predniSONE (STERAPRED UNI-PAK 21 TAB) 10 MG (21) TBPK tablet          DISCHARGE INSTRUCTIONS:   Follow-up one week Dr. Marcello Fennel  If you experience worsening of your admission symptoms, develop shortness of breath, life threatening emergency, suicidal or homicidal thoughts you must seek medical attention immediately by calling 911 or calling your MD immediately  if symptoms less severe.  You Must  read complete instructions/literature along with all the possible adverse reactions/side effects for all the Medicines you take and that have been prescribed to you. Take any new Medicines after you have completely understood and accept all the possible adverse reactions/side effects.   Please note  You were cared for by a hospitalist during your hospital stay. If you have any questions about your discharge medications or the care you received while you were in the hospital after you are discharged, you can call the unit and asked to speak with the hospitalist on call if the hospitalist that took care of you is not available. Once you are discharged, your primary care physician will handle any further medical issues. Please note that NO REFILLS for any discharge medications will be authorized once you are discharged, as it is imperative that you return to your primary care physician (or establish a relationship with a primary care physician if you do not have one) for your aftercare needs so that they can reassess your need for medications and monitor your  lab values.    Today   CHIEF COMPLAINT:   Chief Complaint  Patient presents with  . Shortness of Breath    HISTORY OF PRESENT ILLNESS:  Troy [name]  is a 82 y.o. male found to have COPD exacerbation   VITAL SIGNS:  Blood pressure 134/70, pulse 72, temperature 97.7 F (36.5 C), temperature source Oral, resp. rate 20, height 5\' 9"  (1.753 m), weight 104.3 kg (230 lb), SpO2 100 %.    PHYSICAL EXAMINATION:  GENERAL:  82 y.o.-year-old patient lying in the bed with no acute distress.  EYES: Pupils equal, round, reactive to light and accommodation. No scleral icterus. Extraocular muscles intact.  HEENT: Head atraumatic, normocephalic. Oropharynx and nasopharynx clear.  NECK:  Supple, no jugular venous distention. No thyroid enlargement, no tenderness.  LUNGS: Decreased breath sounds bilaterally, slight expiratory wheezing upper right  lung. No rales,rhonchi or crepitation. No use of accessory muscles of respiration.  CARDIOVASCULAR: S1, S2 normal. No murmurs, rubs, or gallops.  ABDOMEN: Soft, non-tender, non-distended. Bowel sounds present. No organomegaly or mass.  EXTREMITIES: 2+ edema. No cyanosis, or clubbing.  NEUROLOGIC: Cranial nerves II through XII are intact. Muscle strength 5/5 in all extremities. Sensation intact. Gait not checked.  PSYCHIATRIC: The patient is alert and oriented x 3.  SKIN: No obvious rash, lesion, or ulcer.   DATA REVIEW:   CBC  Recent Labs Lab 12/11/15 0410  WBC 13.3*  HGB 12.6*  HCT 36.8*  PLT 124*    Chemistries   Recent Labs Lab 12/07/15 0257 12/08/15 0436  NA 141 138  K 4.1 4.7  CL 104 102  CO2 30 27  GLUCOSE 67 308*  BUN 33* 38*  CREATININE 1.18 1.18  CALCIUM 8.8* 8.9  AST 20  --   ALT 8*  --   ALKPHOS 64  --   BILITOT 0.9  --     Cardiac Enzymes  Recent Labs Lab 12/07/15 0257  TROPONINI 0.03*    Microbiology Results  Results for orders placed or performed during the hospital encounter of 12/07/15  Culture, blood (routine x 2)     Status: None (Preliminary result)   Collection Time: 12/07/15  3:22 AM  Result Value Ref Range Status   Specimen Description BLOOD RIGHT ARM  Final   Special Requests BOTTLES DRAWN AEROBIC AND ANAEROBIC  Final   Culture NO GROWTH 3 DAYS  Final   Report Status PENDING  Incomplete  Culture, blood (routine x 2)     Status: None (Preliminary result)   Collection Time: 12/07/15  3:22 AM  Result Value Ref Range Status   Specimen Description BLOOD LEFT ARM  Final   Special Requests BOTTLES DRAWN AEROBIC AND ANAEROBIC  Final   Culture NO GROWTH 3 DAYS  Final   Report Status PENDING  Incomplete   Management plans discussed with the patient, family and they are in agreement.  CODE STATUS:  Code Status History    Date Active Date Inactive Code Status Order ID Comments User Context   12/07/2015  6:27 AM 12/11/2015  5:06 PM  DNR [id number]  Ihor Austin, MD ED   09/25/2015  1:18 AM 09/28/2015  8:53 PM DNR [id number]  Oralia Manis, MD Inpatient   08/07/2015  7:50 PM 08/09/2015  3:23 PM DNR [id number]  Darrol Jump, PA-C Inpatient   08/03/2015  4:58 PM 08/07/2015  7:50 PM DNR [id number]  Houston Siren, MD ED   02/25/2015  7:56 PM 03/01/2015  7:21 PM Full  Code [id number]  Gale Journeyatherine P Walsh, MD ED    Questions for Most Recent Historical Code Status (Order [id number])    Question Answer Comment   In the event of cardiac or respiratory ARREST Do not call a "code blue"    In the event of cardiac or respiratory ARREST Do not perform Intubation, CPR, defibrillation or ACLS    In the event of cardiac or respiratory ARREST Use medication by any route, position, wound care, and other measures to relive pain and suffering. May use oxygen, suction and manual treatment of airway obstruction as needed for comfort.         Advance Directive Documentation   Flowsheet Row Most Recent Value  Type of Advance Directive  Healthcare Power of Attorney  Pre-existing out of facility DNR order (yellow form or pink MOST form)  No data  "MOST" Form in Place?  No data      TOTAL TIME TAKING CARE OF THIS PATIENT: 35 minutes.    Alford HighlandWIETING, Zayli Villafuerte M.D on 12/11/2015 at 5:22 PM  Between 7am to 6pm - Pager - [phone]  After 6pm go to [email or web address] - password [email or web address]  [employer] [facility] Office  [phone]  CC: Primary care physician; Barbette ReichmannHANDE,VISHWANATH, MD

## 2015-12-11 NOTE — Progress Notes (Signed)
DISCHARGE NOTE:  Pt given discharge instructions and prescriptions. Pt verbalized understanding. Pt wheeled to car by staff.  

## 2015-12-11 NOTE — Discharge Instructions (Signed)

## 2015-12-11 NOTE — Progress Notes (Signed)
ANTICOAGULATION CONSULT NOTE - Follow Up  Pharmacy Consult for Warfarin Indication: atrial fibrillation  No Known Allergies  Patient Measurements: Height: 5\' 9"  (175.3 cm) Weight: 230 lb (104.3 kg) IBW/kg (Calculated) : 70.7  Vital Signs: Temp: 98.3 F (36.8 C) (08/08 0743) Temp Source: Oral (08/08 0743) BP: 140/70 (08/08 0743) Pulse Rate: 62 (08/08 0743)  Labs:  Recent Labs  12/09/15 0409 12/10/15 0355 12/11/15 0410  HGB  --   --  12.6*  HCT  --   --  36.8*  PLT  --   --  124*  LABPROT 34.2* 31.2* 26.4*  INR 3.29 2.93 2.38    Estimated Creatinine Clearance: 49.5 mL/min (by C-G formula based on SCr of 1.18 mg/dL).  Assessment: 82 yo male on Warfarin for Afib.  Home dose:  Warfarin 3 mg  T,Th,Sat and  5 mg M, W, F, Sun.  8/4  INR 2.00 - warfarin 5 mg 8/5  INR 2.83 - warfarin 3 mg 8/6  INR 3.29 - no warfarin 8/7  INR 2.93 - warfarin 2.5 mg  Goal of Therapy:  INR 2-3 Monitor platelets by anticoagulation protocol: Yes   Plan:  INR within goal range, trending down. Pt on abx that may interact with warfarin. Pt eating 100% of meals.  Will give warfarin 5 mg PO for today as INR trended down significantly Follow up INR in am to determine further dosing. CBC in AM (and every 3 days) per policy.   Pharmacy will continue to follow.   Marty HeckWang, Sylvio Weatherall L, RPh Clinical Pharmacist 12/11/2015,9:16 AM

## 2015-12-11 NOTE — Care Management Important Message (Signed)
Important Message  Patient Details  Name: Troy BurnRobert [name] MRN: [id number] Date of Birth: [birth year]   Medicare Important Message Given:  Yes    [name], Dorla Guizar L, RN 12/11/2015, 9:07 AM

## 2015-12-12 LAB — CULTURE, BLOOD (ROUTINE X 2)
CULTURE: NO GROWTH
Culture: NO GROWTH

## 2015-12-13 ENCOUNTER — Observation Stay
Admission: EM | Admit: 2015-12-13 | Discharge: 2015-12-16 | Disposition: A | Discharge status: Home / Self Care | Payer: Medicare Other | Attending: Internal Medicine | Admitting: Internal Medicine

## 2015-12-13 ENCOUNTER — Emergency Department: Payer: Medicare Other

## 2015-12-13 ENCOUNTER — Encounter: Payer: Self-pay | Admitting: Emergency Medicine

## 2015-12-13 DIAGNOSIS — N183 Chronic kidney disease, stage 3 (moderate): Secondary | ICD-10-CM | POA: Diagnosis not present

## 2015-12-13 DIAGNOSIS — Z952 Presence of prosthetic heart valve: Secondary | ICD-10-CM | POA: Insufficient documentation

## 2015-12-13 DIAGNOSIS — Z794 Long term (current) use of insulin: Secondary | ICD-10-CM | POA: Diagnosis not present

## 2015-12-13 DIAGNOSIS — Z9889 Other specified postprocedural states: Secondary | ICD-10-CM | POA: Insufficient documentation

## 2015-12-13 DIAGNOSIS — Z9981 Dependence on supplemental oxygen: Secondary | ICD-10-CM | POA: Insufficient documentation

## 2015-12-13 DIAGNOSIS — Z807 Family history of other malignant neoplasms of lymphoid, hematopoietic and related tissues: Secondary | ICD-10-CM | POA: Insufficient documentation

## 2015-12-13 DIAGNOSIS — I4510 Unspecified right bundle-branch block: Secondary | ICD-10-CM | POA: Diagnosis not present

## 2015-12-13 DIAGNOSIS — T380X5A Adverse effect of glucocorticoids and synthetic analogues, initial encounter: Secondary | ICD-10-CM | POA: Insufficient documentation

## 2015-12-13 DIAGNOSIS — Z87891 Personal history of nicotine dependence: Secondary | ICD-10-CM | POA: Insufficient documentation

## 2015-12-13 DIAGNOSIS — Z7901 Long term (current) use of anticoagulants: Secondary | ICD-10-CM | POA: Diagnosis not present

## 2015-12-13 DIAGNOSIS — E875 Hyperkalemia: Secondary | ICD-10-CM | POA: Diagnosis not present

## 2015-12-13 DIAGNOSIS — Z66 Do not resuscitate: Secondary | ICD-10-CM | POA: Diagnosis not present

## 2015-12-13 DIAGNOSIS — E039 Hypothyroidism, unspecified: Secondary | ICD-10-CM | POA: Insufficient documentation

## 2015-12-13 DIAGNOSIS — G20 Parkinson's disease: Secondary | ICD-10-CM | POA: Insufficient documentation

## 2015-12-13 DIAGNOSIS — H548 Legal blindness, as defined in USA: Secondary | ICD-10-CM | POA: Insufficient documentation

## 2015-12-13 DIAGNOSIS — Z823 Family history of stroke: Secondary | ICD-10-CM | POA: Insufficient documentation

## 2015-12-13 DIAGNOSIS — J441 Chronic obstructive pulmonary disease with (acute) exacerbation: Secondary | ICD-10-CM | POA: Diagnosis not present

## 2015-12-13 DIAGNOSIS — E1122 Type 2 diabetes mellitus with diabetic chronic kidney disease: Secondary | ICD-10-CM | POA: Insufficient documentation

## 2015-12-13 DIAGNOSIS — Z8673 Personal history of transient ischemic attack (TIA), and cerebral infarction without residual deficits: Secondary | ICD-10-CM | POA: Insufficient documentation

## 2015-12-13 DIAGNOSIS — Z836 Family history of other diseases of the respiratory system: Secondary | ICD-10-CM | POA: Insufficient documentation

## 2015-12-13 DIAGNOSIS — I5022 Chronic systolic (congestive) heart failure: Secondary | ICD-10-CM | POA: Insufficient documentation

## 2015-12-13 DIAGNOSIS — Z87440 Personal history of urinary (tract) infections: Secondary | ICD-10-CM | POA: Insufficient documentation

## 2015-12-13 DIAGNOSIS — E1165 Type 2 diabetes mellitus with hyperglycemia: Secondary | ICD-10-CM | POA: Diagnosis not present

## 2015-12-13 DIAGNOSIS — I482 Chronic atrial fibrillation: Secondary | ICD-10-CM | POA: Diagnosis not present

## 2015-12-13 DIAGNOSIS — R531 Weakness: Secondary | ICD-10-CM | POA: Diagnosis present

## 2015-12-13 DIAGNOSIS — I130 Hypertensive heart and chronic kidney disease with heart failure and stage 1 through stage 4 chronic kidney disease, or unspecified chronic kidney disease: Secondary | ICD-10-CM | POA: Diagnosis not present

## 2015-12-13 DIAGNOSIS — Z8249 Family history of ischemic heart disease and other diseases of the circulatory system: Secondary | ICD-10-CM | POA: Insufficient documentation

## 2015-12-13 DIAGNOSIS — Z950 Presence of cardiac pacemaker: Secondary | ICD-10-CM | POA: Insufficient documentation

## 2015-12-13 LAB — DIFFERENTIAL
Basophils Absolute: 0.1 10*3/uL (ref 0–0.1)
Basophils Relative: 0 %
EOS ABS: 0.4 10*3/uL (ref 0–0.7)
EOS PCT: 2 %
LYMPHS ABS: 1.7 10*3/uL (ref 1.0–3.6)
LYMPHS PCT: 11 %
MONOS PCT: 9 %
Monocytes Absolute: 1.4 10*3/uL — ABNORMAL HIGH (ref 0.2–1.0)
NEUTROS PCT: 78 %
Neutro Abs: 11.9 10*3/uL — ABNORMAL HIGH (ref 1.4–6.5)

## 2015-12-13 LAB — HEPATIC FUNCTION PANEL
ALT: 20 U/L (ref 17–63)
AST: 27 U/L (ref 15–41)
Albumin: 3.1 g/dL — ABNORMAL LOW (ref 3.5–5.0)
Alkaline Phosphatase: 57 U/L (ref 38–126)
BILIRUBIN DIRECT: 0.2 mg/dL (ref 0.1–0.5)
BILIRUBIN INDIRECT: 0.9 mg/dL (ref 0.3–0.9)
BILIRUBIN TOTAL: 1.1 mg/dL (ref 0.3–1.2)
Total Protein: 6.2 g/dL — ABNORMAL LOW (ref 6.5–8.1)

## 2015-12-13 LAB — CBC
HEMATOCRIT: 40.8 % (ref 40.0–52.0)
Hemoglobin: 13.7 g/dL (ref 13.0–18.0)
MCH: 30.7 pg (ref 26.0–34.0)
MCHC: 33.6 g/dL (ref 32.0–36.0)
MCV: 91.3 fL (ref 80.0–100.0)
PLATELETS: 142 10*3/uL — AB (ref 150–440)
RBC: 4.47 MIL/uL (ref 4.40–5.90)
RDW: 15.3 % — AB (ref 11.5–14.5)
WBC: 15.4 10*3/uL — AB (ref 3.8–10.6)

## 2015-12-13 LAB — GLUCOSE, CAPILLARY
GLUCOSE-CAPILLARY: 90 mg/dL (ref 65–99)
Glucose-Capillary: 44 mg/dL — CL (ref 65–99)
Glucose-Capillary: 86 mg/dL (ref 65–99)
Glucose-Capillary: 208 mg/dL — ABNORMAL HIGH (ref 65–99)

## 2015-12-13 LAB — BASIC METABOLIC PANEL
Anion gap: 6 (ref 5–15)
BUN: 34 mg/dL — AB (ref 6–20)
CHLORIDE: 99 mmol/L — AB (ref 101–111)
CO2: 33 mmol/L — ABNORMAL HIGH (ref 22–32)
CREATININE: 1.22 mg/dL (ref 0.61–1.24)
Calcium: 8.3 mg/dL — ABNORMAL LOW (ref 8.9–10.3)
GFR calc Af Amer: 58 mL/min — ABNORMAL LOW (ref >60)
GFR, EST NON AFRICAN AMERICAN: 50 mL/min — AB (ref >60)
GLUCOSE: 49 mg/dL — AB (ref 65–99)
Potassium: 4.1 mmol/L (ref 3.5–5.1)
SODIUM: 138 mmol/L (ref 135–145)

## 2015-12-13 LAB — PROTIME-INR
INR: 1.75
PROTHROMBIN TIME: 20.7 s — AB (ref 11.4–15.2)

## 2015-12-13 LAB — TROPONIN I
TROPONIN I: 0.04 ng/mL — AB (ref <0.03)
TROPONIN I: 0.03 ng/mL — AB (ref <0.03)

## 2015-12-13 LAB — BRAIN NATRIURETIC PEPTIDE: B NATRIURETIC PEPTIDE 5: 63 pg/mL (ref 0.0–100.0)

## 2015-12-13 LAB — LACTIC ACID, PLASMA: Lactic Acid, Venous: 0.8 mmol/L (ref 0.5–1.9)

## 2015-12-13 MED ORDER — LINAGLIPTIN 5 MG PO TABS
5.0000 mg | ORAL_TABLET | Freq: Every day | ORAL | Status: DC
  Administered 2015-12-14 – 2015-12-16 (×3): 5 mg via ORAL
  Filled 2015-12-13 (×3): qty 1

## 2015-12-13 MED ORDER — CARBIDOPA-LEVODOPA 25-250 MG PO TABS
1.0000 | ORAL_TABLET | Freq: Three times a day (TID) | ORAL | Status: DC
  Administered 2015-12-13 – 2015-12-16 (×8): 1 via ORAL
  Filled 2015-12-13 (×9): qty 1

## 2015-12-13 MED ORDER — ALBUTEROL SULFATE (2.5 MG/3ML) 0.083% IN NEBU
2.5000 mg | INHALATION_SOLUTION | Freq: Four times a day (QID) | RESPIRATORY_TRACT | Status: DC
  Administered 2015-12-13 – 2015-12-14 (×2): 2.5 mg via RESPIRATORY_TRACT
  Filled 2015-12-13 (×2): qty 3

## 2015-12-13 MED ORDER — SODIUM CHLORIDE 0.9 % IV SOLN
INTRAVENOUS | Status: DC
Start: 2015-12-13 — End: 2015-12-14
  Administered 2015-12-13: 21:00:00 via INTRAVENOUS

## 2015-12-13 MED ORDER — INSULIN ASPART 100 UNIT/ML ~~LOC~~ SOLN
0.0000 [IU] | Freq: Three times a day (TID) | SUBCUTANEOUS | Status: DC
Start: 2015-12-13 — End: 2015-12-16
  Administered 2015-12-13: 23:00:00 4 [IU] via SUBCUTANEOUS
  Administered 2015-12-14: 12:00:00 10 [IU] via SUBCUTANEOUS
  Administered 2015-12-14: 12 [IU] via SUBCUTANEOUS
  Administered 2015-12-14: 22:00:00 10 [IU] via SUBCUTANEOUS
  Administered 2015-12-14 – 2015-12-15 (×4): 12 [IU] via SUBCUTANEOUS
  Administered 2015-12-15: 09:00:00 10 [IU] via SUBCUTANEOUS
  Administered 2015-12-16: 6 [IU] via SUBCUTANEOUS
  Administered 2015-12-16: 13:00:00 10 [IU] via SUBCUTANEOUS
  Filled 2015-12-13: qty 4
  Filled 2015-12-13: qty 6
  Filled 2015-12-13 (×3): qty 12
  Filled 2015-12-13 (×2): qty 10
  Filled 2015-12-13: qty 12
  Filled 2015-12-13 (×2): qty 10
  Filled 2015-12-13: qty 12

## 2015-12-13 MED ORDER — POTASSIUM CHLORIDE CRYS ER 20 MEQ PO TBCR
10.0000 meq | EXTENDED_RELEASE_TABLET | Freq: Every day | ORAL | Status: DC
  Administered 2015-12-13 – 2015-12-14 (×2): 10 meq via ORAL
  Filled 2015-12-13 (×2): qty 1

## 2015-12-13 MED ORDER — METOPROLOL SUCCINATE ER 50 MG PO TB24
25.0000 mg | ORAL_TABLET | Freq: Every evening | ORAL | Status: DC
  Administered 2015-12-13 – 2015-12-15 (×3): 25 mg via ORAL
  Filled 2015-12-13 (×3): qty 1

## 2015-12-13 MED ORDER — IPRATROPIUM-ALBUTEROL 0.5-2.5 (3) MG/3ML IN SOLN
3.0000 mL | Freq: Once | RESPIRATORY_TRACT | Status: AC
  Administered 2015-12-13: 3 mL via RESPIRATORY_TRACT
  Filled 2015-12-13: qty 3

## 2015-12-13 MED ORDER — LORATADINE 10 MG PO TABS
10.0000 mg | ORAL_TABLET | Freq: Every day | ORAL | Status: DC
  Administered 2015-12-14 – 2015-12-16 (×3): 10 mg via ORAL
  Filled 2015-12-13 (×3): qty 1

## 2015-12-13 MED ORDER — ONDANSETRON HCL 4 MG PO TABS
4.0000 mg | ORAL_TABLET | Freq: Four times a day (QID) | ORAL | Status: DC | PRN
Start: 2015-12-13 — End: 2015-12-16

## 2015-12-13 MED ORDER — WARFARIN SODIUM 3 MG PO TABS
3.0000 mg | ORAL_TABLET | ORAL | Status: DC
  Administered 2015-12-13: 22:00:00 3 mg via ORAL
  Filled 2015-12-13: qty 1

## 2015-12-13 MED ORDER — METHYLPREDNISOLONE SODIUM SUCC 125 MG IJ SOLR
60.0000 mg | Freq: Four times a day (QID) | INTRAMUSCULAR | Status: DC
  Administered 2015-12-13 – 2015-12-14 (×3): 60 mg via INTRAVENOUS
  Filled 2015-12-13 (×3): qty 2

## 2015-12-13 MED ORDER — WARFARIN - PHARMACIST DOSING INPATIENT
Freq: Every day | Status: DC
  Administered 2015-12-14 – 2015-12-15 (×2)

## 2015-12-13 MED ORDER — LISINOPRIL 10 MG PO TABS
40.0000 mg | ORAL_TABLET | Freq: Every day | ORAL | Status: DC
  Administered 2015-12-13 – 2015-12-15 (×2): 40 mg via ORAL
  Filled 2015-12-13 (×2): qty 4

## 2015-12-13 MED ORDER — DEXTROSE 50 % IV SOLN
INTRAVENOUS | Status: AC
  Administered 2015-12-13: 25 mL via INTRAVENOUS
  Filled 2015-12-13: qty 50

## 2015-12-13 MED ORDER — FUROSEMIDE 40 MG PO TABS
40.0000 mg | ORAL_TABLET | ORAL | Status: DC
  Administered 2015-12-14 – 2015-12-16 (×3): 40 mg via ORAL
  Filled 2015-12-13 (×3): qty 1

## 2015-12-13 MED ORDER — WARFARIN SODIUM 5 MG PO TABS
5.0000 mg | ORAL_TABLET | ORAL | Status: DC
  Administered 2015-12-14: 18:00:00 5 mg via ORAL
  Filled 2015-12-13: qty 1

## 2015-12-13 MED ORDER — DEXTROSE 50 % IV SOLN
25.0000 mL | Freq: Once | INTRAVENOUS | Status: AC
  Administered 2015-12-13: 25 mL via INTRAVENOUS

## 2015-12-13 MED ORDER — HYDROCODONE-ACETAMINOPHEN 5-325 MG PO TABS: 1.0000 | ORAL_TABLET | ORAL | Status: DC | PRN

## 2015-12-13 MED ORDER — ACETAMINOPHEN 650 MG RE SUPP: 650.0000 mg | Freq: Four times a day (QID) | RECTAL | Status: DC | PRN

## 2015-12-13 MED ORDER — IPRATROPIUM-ALBUTEROL 0.5-2.5 (3) MG/3ML IN SOLN: 3.0000 mL | RESPIRATORY_TRACT | Status: DC | PRN

## 2015-12-13 MED ORDER — ONDANSETRON HCL 4 MG/2ML IJ SOLN: 4.0000 mg | Freq: Four times a day (QID) | INTRAMUSCULAR | Status: DC | PRN

## 2015-12-13 MED ORDER — FUROSEMIDE 40 MG PO TABS
20.0000 mg | ORAL_TABLET | Freq: Every evening | ORAL | Status: DC
  Administered 2015-12-13 – 2015-12-15 (×3): 20 mg via ORAL
  Filled 2015-12-13 (×3): qty 1

## 2015-12-13 MED ORDER — ACETAMINOPHEN 325 MG PO TABS: 650.0000 mg | ORAL_TABLET | Freq: Four times a day (QID) | ORAL | Status: DC | PRN

## 2015-12-13 NOTE — Progress Notes (Signed)
ANTICOAGULATION CONSULT NOTE - Initial Consult  Pharmacy Consult for Warfarin  Indication: atrial fibrillation  No Known Allergies  Patient Measurements: Height:  (175.3 cm) Weight: 260 lb (117.9 kg) IBW/kg (Calculated) : 70.7 Heparin Dosing Weight:   Vital Signs: Temp: 99.1 F (37.3 C) (08/10 2036) Temp Source: Oral (08/10 2036) BP: 125/78 (08/10 2036) Pulse Rate: 101 (08/10 2036)  Labs:  Recent Labs  12/11/15 0410 12/13/15 1512 12/13/15 2109  HGB 12.6* 13.7  --   HCT 36.8* 40.8  --   PLT 124* 142*  --   LABPROT 26.4* 20.7*  --   INR 2.38 1.75  --   CREATININE  --  1.22  --   TROPONINI  --  0.04* 0.03*    Estimated Creatinine Clearance: 51 mL/min (by C-G formula based on SCr of 1.22 mg/dL).   Medical History: Past Medical History:  Diagnosis Date  . Chronic atrial fibrillation (HCC)    a. on Couamdin; b. CHADS2VASc at least 7 (CHF, HTN, age x 2, DM, stroke x 2); c. s/p MAZE 1999  . Chronic systolic CHF (congestive heart failure) (HCC)    a. echo 08/01/2015: EF of 45%, mild LVH, mitral valve with annular calcification, mitral valve partially mobile  . CKD (chronic kidney disease), stage III   . COPD (chronic obstructive pulmonary disease) (HCC)   . Diabetes mellitus without complication (HCC)   . Hypertension   . Hypothyroidism   . Legally blind   . Mitral valve prolapse    a. s/p mitral valve repair 1999  . Normal coronary arteries    a. by cardiac cath 1999  . Parkinson's disease (HCC)   . Stroke (HCC)   . TIA (transient ischemic attack)   . UTI (lower urinary tract infection)     Medications:  Prescriptions Prior to Admission  Medication Sig Dispense Refill Last Dose  . albuterol (PROVENTIL HFA) 108 (90 Base) MCG/ACT inhaler Inhale 2 puffs into the lungs every 4 (four) hours as needed for wheezing or shortness of breath. (Patient taking differently: Inhale 2 puffs into the lungs 4 (four) times daily. ) 1 Inhaler 0 12/13/2015 at pm  . albuterol  (PROVENTIL) (2.5 MG/3ML) 0.083% nebulizer solution Take 2.5 mg by nebulization 4 (four) times daily.    12/13/2015 at pm  . azithromycin (ZITHROMAX) 250 MG tablet One tablet daily until finished (Patient taking differently: Take 250-500 mg by mouth daily. 500 mg on day 1, then 250 mg daily for 4 days, then stop (started on 12/11/15)) 3 each 0 12/13/2015 at am  . carbidopa-levodopa (SINEMET IR) 25-250 MG tablet Take 1 tablet by mouth 3 (three) times daily.   12/13/2015 at pm  . cetirizine (ZYRTEC) 10 MG tablet Take 10 mg by mouth daily.   12/13/2015 at am  . furosemide (LASIX) 40 MG tablet Take 20-40 mg by mouth 2 (two) times daily. 40 mg every morning and 20 mg every evening   12/13/2015 at am  . glipiZIDE (GLUCOTROL XL) 10 MG 24 hr tablet Take 5-10 mg by mouth 2 (two) times daily. 10 mg every morning and 5 mg every evening   12/13/2015 at am  . insulin regular human CONCENTRATED (HUMULIN R) 500 UNIT/ML injection Inject 8-12 Units into the skin 2 (two) times daily with a meal. 12 units every morning and 8 units every evening   12/13/2015 at am  . lisinopril (PRINIVIL,ZESTRIL) 40 MG tablet Take 40 mg by mouth at bedtime.    12/12/2015 at pm  .  metoprolol succinate (TOPROL-XL) 25 MG 24 hr tablet Take 1 tablet (25 mg total) by mouth every evening. Take with or immediately following a meal. 30 tablet 0 12/12/2015 at 1830  . potassium chloride (MICRO-K) 10 MEQ CR capsule Take 10 mEq by mouth daily.   12/13/2015 at am  . predniSONE (DELTASONE) 5 MG tablet Take 5 mg by mouth daily with breakfast.   12/13/2015 at am  . saxagliptin HCl (ONGLYZA) 5 MG TABS tablet Take 5 mg by mouth daily.   12/13/2015 at am  . warfarin (COUMADIN) 3 MG tablet Take 3 mg by mouth every evening. Pt takes on Tuesday, Thursday, Saturday and Sunday   12/11/2015 at 1830  . warfarin (COUMADIN) 5 MG tablet Take 5 mg by mouth every evening. Takes Monday, Wednesday, and Friday   12/12/2015 at 1830  . fluticasone-salmeterol (ADVAIR HFA) 115-21 MCG/ACT inhaler  Inhale 2 puffs into the lungs 2 (two) times daily. (Patient not taking: Reported on 12/13/2015) 1 Inhaler 0 Not Taking at Unknown time  . glipiZIDE (GLUCOTROL XL) 5 MG 24 hr tablet Take 1 tablet (5 mg total) by mouth daily with breakfast. (Patient not taking: Reported on 12/13/2015) 30 tablet 0 Not Taking at Unknown time  . insulin glargine (LANTUS) 100 UNIT/ML injection Inject 0.24 mLs (24 Units total) into the skin daily. (Patient not taking: Reported on 12/13/2015) 10 mL 0 Not Taking at Unknown time  . polyethylene glycol (MIRALAX / GLYCOLAX) packet Take 17 g by mouth daily. (Patient not taking: Reported on 12/13/2015) 30 each 0 Not Taking at Unknown time  . predniSONE (DELTASONE) 5 MG tablet 6 tablets po day1; 5 tablets po day 2; 4 tablets po day3; 3 tablets po day 4; 2 tablets day5; 1 tablets daily afterwards (Patient not taking: Reported on 12/13/2015) 40 tablet 0 Not Taking at Unknown time    Assessment: Pt was recently hospitalized for COPD exacerbation , was therapeutic then on current dose of warfarin.   8/10:  INR = 1.8  Goal of Therapy:  INR 2-3   Plan:  Will resume warfarin home dose.   Warfarin 3 mg PO Tues-Thurs-Sat-Sun.  Warfarin 5 mg PO MWF.  Will recheck INR on 8/11 with AM labs.   Troy [name] D 12/13/2015,9:59 PM

## 2015-12-13 NOTE — ED Notes (Signed)
Patient ambulated in room by this nurse and Allayne StackJann, tech.  Pt O2 saturation dropped to 85% 2L [place] while walking.  MD notified.

## 2015-12-13 NOTE — ED Provider Notes (Signed)
[facility] Provider Note   ____________________________________________   First MD Initiated Contact with Patient 12/13/15 1520     (approximate)  I have reviewed the triage vital signs and the nursing notes.   HISTORY  Chief Complaint Weakness and COPD    HPI Troy [name] is a 82 y.o. male patient recently discharged from the hospital here on Tuesday for COPD exacerbation. He still taking the Z-Pak. He is now weak and short of breath. He is having more swelling than usual. Oxygen saturations at home was 70% on 3 L. He usually uses 2 L. His not having a fever. He is having a little bit more cough than usual. He feels weaker than usual.   Past Medical History:  Diagnosis Date  . Chronic atrial fibrillation (HCC)    a. on Couamdin; b. CHADS2VASc at least 7 (CHF, HTN, age x 2, DM, stroke x 2); c. s/p MAZE 1999  . Chronic systolic CHF (congestive heart failure) (HCC)    a. echo 08/01/2015: EF of 45%, mild LVH, mitral valve with annular calcification, mitral valve partially mobile  . CKD (chronic kidney disease), stage III   . COPD (chronic obstructive pulmonary disease) (HCC)   . Diabetes mellitus without complication (HCC)   . Hypertension   . Hypothyroidism   . Legally blind   . Mitral valve prolapse    a. s/p mitral valve repair 1999  . Normal coronary arteries    a. by cardiac cath 1999  . Parkinson's disease (HCC)   . Stroke (HCC)   . TIA (transient ischemic attack)   . UTI (lower urinary tract infection)     Patient Active Problem List   Diagnosis Date Noted  . Acute exacerbation of chronic obstructive pulmonary disease (COPD) (HCC) 12/07/2015  . Acute on chronic respiratory failure with hypoxia (HCC) 09/28/2015  . HTN (hypertension) 09/25/2015  . Parkinson's disease (HCC) 09/25/2015  . Hypothyroidism 09/25/2015  . COPD exacerbation (HCC) 09/12/2015  . Diabetes (HCC) 08/20/2015  . SSS (sick sinus syndrome) (HCC)  08/07/2015  . Chronic atrial fibrillation (HCC)   . Sinus pause   . Sick sinus syndrome (HCC)   . Acute on chronic diastolic CHF (congestive heart failure), NYHA class 1 (HCC) 08/03/2015  . Lower extremity weakness 02/25/2015    Past Surgical History:  Procedure Laterality Date  . CARDIAC CATHETERIZATION N/A 08/08/2015   Procedure: Temporary Pacemaker;  Surgeon: Peter M Sw[place], MD;  Location: [facility] INVASIVE CV LAB;  Service: Cardiovascular;  Laterality: N/A;  . CARDIAC SURGERY    . CHOLECYSTECTOMY    . EP IMPLANTABLE DEVICE N/A 08/08/2015   Procedure: Pacemaker Implant;  Surgeon: Will Jorja Loa, MD;  Location: [facility] INVASIVE CV LAB;  Service: Cardiovascular;  Laterality: N/A;    Prior to Admission medications   Medication Sig Start Date End Date Taking? Authorizing Provider  albuterol (PROVENTIL HFA) 108 (90 Base) MCG/ACT inhaler Inhale 2 puffs into the lungs every 4 (four) hours as needed for wheezing or shortness of breath. 07/11/15   Sharman Cheek, MD  albuterol (PROVENTIL) (2.5 MG/3ML) 0.083% nebulizer solution Take 2.5 mg by nebulization every 6 (six) hours as needed for wheezing or shortness of breath.    Historical Provider, MD  azithromycin (ZITHROMAX) 250 MG tablet One tablet daily until finished 12/11/15   Alford Highland, MD  carbidopa-levodopa (SINEMET IR) 25-250 MG tablet Take 1 tablet by mouth 3 (three) times daily.    Historical Provider, MD  cetirizine (ZYRTEC) 10  MG tablet Take 10 mg by mouth daily.    Historical Provider, MD  fluticasone-salmeterol (ADVAIR HFA) 115-21 MCG/ACT inhaler Inhale 2 puffs into the lungs 2 (two) times daily. 12/11/15   Alford Highland, MD  furosemide (LASIX) 40 MG tablet Take 40 mg by mouth every morning.     Historical Provider, MD  glipiZIDE (GLUCOTROL XL) 5 MG 24 hr tablet Take 1 tablet (5 mg total) by mouth daily with breakfast. 12/11/15   Alford Highland, MD  insulin glargine (LANTUS) 100 UNIT/ML injection Inject 0.24 mLs (24 Units total) into the  skin daily. 12/11/15   Alford Highland, MD  insulin regular human CONCENTRATED (HUMULIN R) 500 UNIT/ML injection Inject 4-6 Units into the skin 2 (two) times daily with a meal. 4 units in the morning and 6 units at bedtime    Historical Provider, MD  lisinopril (PRINIVIL,ZESTRIL) 40 MG tablet Take 40 mg by mouth at bedtime.     Historical Provider, MD  metoprolol succinate (TOPROL-XL) 25 MG 24 hr tablet Take 1 tablet (25 mg total) by mouth every evening. Take with or immediately following a meal. 08/06/15   Delfino Lovett, MD  polyethylene glycol (MIRALAX / GLYCOLAX) packet Take 17 g by mouth daily. 12/11/15   Alford Highland, MD  predniSONE (DELTASONE) 5 MG tablet 6 tablets po day1; 5 tablets po day 2; 4 tablets po day3; 3 tablets po day 4; 2 tablets day5; 1 tablets daily afterwards 12/11/15   Alford Highland, MD  saxagliptin HCl (ONGLYZA) 2.5 MG TABS tablet Take 5 mg by mouth every evening.    Historical Provider, MD  warfarin (COUMADIN) 3 MG tablet Take 3 mg by mouth every evening. Pt takes on Tuesday, Thursday, and Saturday.    Historical Provider, MD  warfarin (COUMADIN) 5 MG tablet Take 5 mg by mouth daily. Takes Monday, Wednesday,Friday and Sunday    Historical Provider, MD    Allergies Review of patient's allergies indicates no known allergies.  Family History  Problem Relation Age of Onset  . Hypertension Mother   . Stroke Mother   . CAD Father   . Lymphoma Sister   . Lung disease Brother     Social History Social History  Substance Use Topics  . Smoking status: Former Smoker    Packs/day: 4.00    Years: 20.00    Types: Cigarettes  . Smokeless tobacco: Never Used  . Alcohol use No    Review of Systems Constitutional: No fever/chills Eyes: No visual changes. ENT: No sore throat. Cardiovascular: Denies chest pain. Respiratory: Denies shortness of breath. Gastrointestinal: No abdominal pain.  No nausea, no vomiting.  No diarrhea.  No constipation. Genitourinary: Negative for  dysuria. Musculoskeletal: Negative for back pain. Skin: Negative for rash. Neurological: Negative for headaches, focal weakness or numbness.  10-point ROS otherwise negative.  ____________________________________________   PHYSICAL EXAM:  VITAL SIGNS: ED Triage Vitals  Enc Vitals Group     BP 12/13/15 1458 99/77     Pulse Rate 12/13/15 1458 100     Resp 12/13/15 1458 20     Temp 12/13/15 1458 98.1 F (36.7 C)     Temp Source 12/13/15 1458 Oral     SpO2 12/13/15 1458 (!) 2 %     Weight 12/13/15 1458 260 lb (117.9 kg)     Height 12/13/15 1458 5\' 9"  (1.753 m)     Head Circumference --      Peak Flow --      Pain Score 12/13/15 1505 0  Pain Loc --      Pain Edu? --      Excl. in GC? --     Constitutional: Alert and oriented. Well appearing and in no acute distress. Eyes: Conjunctivae are normal. PERRL. EOMI. Head: Atraumatic. Nose: No congestion/rhinnorhea. Mouth/Throat: Mucous membranes are moist.  Oropharynx non-erythematous. Neck: No stridor. Cardiovascular: Normal rate, regular rhythm. Grossly normal heart sounds.  Good peripheral circulation. Respiratory: Normal respiratory effort.  No retractions. Lungs Decreased breath sounds diffusely Gastrointestinal: Soft and nontender. No distention. No abdominal bruits. No CVA tenderness. Musculoskeletal: No lower extremity tenderness There is edema which his wife reports is worse than usual.  No joint effusions. Neurologic:  Normal speech and language. No gross focal neurologic deficits are appreciated.  Skin:  Skin is warm, dry and intact. No rash noted.   ____________________________________________   LABS (all labs ordered are listed, but only abnormal results are displayed)  Labs Reviewed  BASIC METABOLIC PANEL - Abnormal; Notable for the following:       Result Value   Chloride 99 (*)    CO2 33 (*)    Glucose, Bld 49 (*)    BUN 34 (*)    Calcium 8.3 (*)    GFR calc non Af Amer 50 (*)    GFR calc Af Amer 58  (*)    All other components within normal limits  CBC - Abnormal; Notable for the following:    WBC 15.4 (*)    RDW 15.3 (*)    Platelets 142 (*)    All other components within normal limits  TROPONIN I - Abnormal; Notable for the following:    Troponin I 0.04 (*)    All other components within normal limits  HEPATIC FUNCTION PANEL - Abnormal; Notable for the following:    Total Protein 6.2 (*)    Albumin 3.1 (*)    All other components within normal limits  DIFFERENTIAL - Abnormal; Notable for the following:    Neutro Abs 11.9 (*)    Monocytes Absolute 1.4 (*)    All other components within normal limits  GLUCOSE, CAPILLARY - Abnormal; Notable for the following:    Glucose-Capillary 44 (*)    All other components within normal limits  LACTIC ACID, PLASMA  BRAIN NATRIURETIC PEPTIDE  GLUCOSE, CAPILLARY  LACTIC ACID, PLASMA  CBC WITH DIFFERENTIAL/PLATELET  CBG MONITORING, ED   ____________________________________________  EKG  KG read and interpreted by me shows an irregularly irregular rhythm there are some apparent pacer spikes noted ____________________________________________  RADIOLOGY  CLINICAL DATA:  Shortness of Breath  EXAM: PORTABLE CHEST 1 VIEW  COMPARISON:  12/09/2015  FINDINGS: Cardiomegaly again noted. Status post median sternotomy. Single lead cardiac pacemaker is unchanged in position. Right arm PICC line has been removed. No infiltrate or pulmonary edema. Mild basilar atelectasis.  IMPRESSION: Cardiomegaly again noted. Right PICC line has been removed. No infiltrate or pulmonary edema. Mild basilar atelectasis.   Electronically Signed   By: Natasha Mead M.D.   On: 12/13/2015 15:56 ____________________________________________   PROCEDURES  Procedure(s) performed:   Procedures  Critical Care performed:  ____________________________________________   INITIAL IMPRESSION / ASSESSMENT AND PLAN / ED COURSE  Pertinent labs & imaging  results that were available during my care of the patient were reviewed by me and considered in my medical decision making (see chart for details).  Patient's white blood count is climbing again. Patient's troponin is minimally elevated slightly higher than previously but probably not significantly. Patient's blood glucose was  low he got some orange juice and something to eat and then climbed. Patient however desats considerably with minimal exertion give him a diagnosis of COPD exacerbation and admitted him  Clinical Course     ____________________________________________   FINAL CLINICAL IMPRESSION(S) / ED DIAGNOSES  Final diagnoses:  Weakness  COPD exacerbation (HCC)      NEW MEDICATIONS STARTED DURING THIS VISIT:  New Prescriptions   No medications on file     Note:  This document was prepared using Dragon voice recognition software and may include unintentional dictation errors.    Arnaldo NatalPaul F Jinx Gilden, MD 12/13/15 [phone]

## 2015-12-13 NOTE — H&P (Signed)
[facility] - [place] @ [facility] Admission History and Physical Troy [name], D.O.  ---------------------------------------------------------------------------------------------------------------------   PATIENT NAME: Troy [name] MR#: [id number] DATE OF BIRTH: Nov 10, 1923 DATE OF ADMISSION: 12/13/2015 PRIMARY CARE PHYSICIAN: Barbette Reichmann, MD  REQUESTING/REFERRING PHYSICIAN: ED Dr. Darnelle Catalan  CHIEF COMPLAINT: Chief Complaint  Patient presents with  . Weakness  . COPD    HISTORY OF PRESENT ILLNESS: Troy [name] is a 82 y.o. male with a known history of afib, CHF, home O2 dependent COPD, CKD who was recently discharged from the hospital here two days ago for COPD exacerbation, discharged on Prednisone, zithromax and nebs. He now complains of shortness of breath, dyspnea on exertion, cough, wheezing and weakness. Wife states sats were 70% on 3L (usually uses 2L at home)   Otherwise there has been no change in status. Patient has been taking medication as prescribed and there has been no recent change in medication or diet.  There has been no recent illness, travel or sick contacts.    Patient denies fevers/chills, dizziness, chest pain, N/V/C/D, abdominal pain, dysuria/frequency, changes in mental status.    PAST MEDICAL HISTORY: Past Medical History:  Diagnosis Date  . Chronic atrial fibrillation (HCC)    a. on Couamdin; b. CHADS2VASc at least 7 (CHF, HTN, age x 2, DM, stroke x 2); c. s/p MAZE 1999  . Chronic systolic CHF (congestive heart failure) (HCC)    a. echo 08/01/2015: EF of 45%, mild LVH, mitral valve with annular calcification, mitral valve partially mobile  . CKD (chronic kidney disease), stage III   . COPD (chronic obstructive pulmonary disease) (HCC)   . Diabetes mellitus without complication (HCC)   . Hypertension   . Hypothyroidism   . Legally blind   . Mitral valve prolapse    a. s/p mitral valve repair 1999  . Normal coronary arteries    a. by  cardiac cath 1999  . Parkinson's disease (HCC)   . Stroke (HCC)   . TIA (transient ischemic attack)   . UTI (lower urinary tract infection)       PAST SURGICAL HISTORY: Past Surgical History:  Procedure Laterality Date  . CARDIAC CATHETERIZATION N/A 08/08/2015   Procedure: Temporary Pacemaker;  Surgeon: Peter M Swaziland, MD;  Location: [facility] INVASIVE CV LAB;  Service: Cardiovascular;  Laterality: N/A;  . CARDIAC SURGERY    . CHOLECYSTECTOMY    . EP IMPLANTABLE DEVICE N/A 08/08/2015   Procedure: Pacemaker Implant;  Surgeon: Will Jorja Loa, MD;  Location: [facility] INVASIVE CV LAB;  Service: Cardiovascular;  Laterality: N/A;      SOCIAL HISTORY: Social History  Substance Use Topics  . Smoking status: Former Smoker    Packs/day: 4.00    Years: 20.00    Types: Cigarettes  . Smokeless tobacco: Never Used  . Alcohol use No      FAMILY HISTORY: Family History  Problem Relation Age of Onset  . Hypertension Mother   . Stroke Mother   . CAD Father   . Lymphoma Sister   . Lung disease Brother      MEDICATIONS AT HOME: Prior to Admission medications   Medication Sig Start Date End Date Taking? Authorizing Provider  albuterol (PROVENTIL HFA) 108 (90 Base) MCG/ACT inhaler Inhale 2 puffs into the lungs every 4 (four) hours as needed for wheezing or shortness of breath. Patient taking differently: Inhale 2 puffs into the lungs 4 (four) times daily.  07/11/15  Yes Sharman Cheek, MD  albuterol (PROVENTIL) (2.5 MG/3ML) 0.083% nebulizer solution Take 2.5  mg by nebulization 4 (four) times daily.    Yes Historical Provider, MD  azithromycin (ZITHROMAX) 250 MG tablet One tablet daily until finished Patient taking differently: Take 250-500 mg by mouth daily. 500 mg on day 1, then 250 mg daily for 4 days, then stop (started on 12/11/15) 12/11/15  Yes Alford Highland, MD  carbidopa-levodopa (SINEMET IR) 25-250 MG tablet Take 1 tablet by mouth 3 (three) times daily.   Yes Historical Provider, MD   cetirizine (ZYRTEC) 10 MG tablet Take 10 mg by mouth daily.   Yes Historical Provider, MD  furosemide (LASIX) 40 MG tablet Take 20-40 mg by mouth 2 (two) times daily. 40 mg every morning and 20 mg every evening   Yes Historical Provider, MD  glipiZIDE (GLUCOTROL XL) 10 MG 24 hr tablet Take 5-10 mg by mouth 2 (two) times daily. 10 mg every morning and 5 mg every evening   Yes Historical Provider, MD  insulin regular human CONCENTRATED (HUMULIN R) 500 UNIT/ML injection Inject 8-12 Units into the skin 2 (two) times daily with a meal. 12 units every morning and 8 units every evening   Yes Historical Provider, MD  lisinopril (PRINIVIL,ZESTRIL) 40 MG tablet Take 40 mg by mouth at bedtime.    Yes Historical Provider, MD  metoprolol succinate (TOPROL-XL) 25 MG 24 hr tablet Take 1 tablet (25 mg total) by mouth every evening. Take with or immediately following a meal. 08/06/15  Yes Vipul Sherryll Burger, MD  potassium chloride (MICRO-K) 10 MEQ CR capsule Take 10 mEq by mouth daily.   Yes Historical Provider, MD  predniSONE (DELTASONE) 5 MG tablet Take 5 mg by mouth daily with breakfast.   Yes Historical Provider, MD  saxagliptin HCl (ONGLYZA) 5 MG TABS tablet Take 5 mg by mouth daily.   Yes Historical Provider, MD  warfarin (COUMADIN) 3 MG tablet Take 3 mg by mouth every evening. Pt takes on Tuesday, Thursday, Saturday and Sunday   Yes Historical Provider, MD  warfarin (COUMADIN) 5 MG tablet Take 5 mg by mouth every evening. Takes Monday, Wednesday, and Friday   Yes Historical Provider, MD  fluticasone-salmeterol (ADVAIR HFA) 115-21 MCG/ACT inhaler Inhale 2 puffs into the lungs 2 (two) times daily. Patient not taking: Reported on 12/13/2015 12/11/15   Alford Highland, MD  glipiZIDE (GLUCOTROL XL) 5 MG 24 hr tablet Take 1 tablet (5 mg total) by mouth daily with breakfast. Patient not taking: Reported on 12/13/2015 12/11/15   Alford Highland, MD  insulin glargine (LANTUS) 100 UNIT/ML injection Inject 0.24 mLs (24 Units total)  into the skin daily. Patient not taking: Reported on 12/13/2015 12/11/15   Alford Highland, MD  polyethylene glycol [facility] Ethelene Hal) packet Take 17 g by mouth daily. Patient not taking: Reported on 12/13/2015 12/11/15   Alford Highland, MD  predniSONE (DELTASONE) 5 MG tablet 6 tablets po day1; 5 tablets po day 2; 4 tablets po day3; 3 tablets po day 4; 2 tablets day5; 1 tablets daily afterwards Patient not taking: Reported on 12/13/2015 12/11/15   Alford Highland, MD      DRUG ALLERGIES: No Known Allergies   REVIEW OF SYSTEMS: CONSTITUTIONAL: No fever/chills. No fatigue, weakness. No weight gain, no weight loss. EYES: No blurry or double vision. ENT: No tinnitus. No postnasal drip. No redness or soreness of the oropharynx. RESPIRATORY: (+) cough, (+) wheeze, no hemoptysis. (+) dyspnea. CARDIOVASCULAR: No chest pain. No orthopnea. No palpitations. No syncope. GASTROINTESTINAL: No nausea, no vomiting or diarrhea. No abdominal pain. No melena or hematochezia.  GENITOURINARY: No dysuria or hematuria. ENDOCRINE: No polyuria or nocturia. No heat or cold intolerance. HEMATOLOGY: No anemia. No bruising. No bleeding. INTEGUMENTARY: No rashes. No lesions. MUSCULOSKELETAL: No arthritis. No swelling. No gout. NEUROLOGIC: No numbness, tingling, weakness or ataxia. No seizure-type activity. PSYCHIATRIC: No anxiety. No depression. No insomnia.  PHYSICAL EXAMINATION: VITAL SIGNS: Blood pressure 122/73, pulse 89, temperature 98.1 F (36.7 C), temperature source Oral, resp. rate 16, height 5\' 9"  (1.753 m), weight 117.9 kg (260 lb), SpO2 98 %.  GENERAL: 82 y.o.-year-old white male patient, well-developed, well-nourished lying in the bed in no acute distress. EYES: Pupils equal, round, reactive to light and accommodation. No scleral icterus. Extraocular muscles intact. HEENT: Head atraumatic, normocephalic. Oropharynx and nasopharynx clear. Mucus membranes moist. NECK: Supple, full range of motion. No JVD,  no bruit heard. No thyroid enlargement, no tenderness, no lymphadenopathy. CHEST: (+) scattered expiratory wheezing, rales, rhonchi or crepitation. No use of accessory muscles of respiration.  No reproducible chest wall tenderness.  CARDIOVASCULAR: S1, S2 normal. No murmurs, rubs, or gallops. Cap refill <2 seconds. ABDOMEN: Soft, nontender, nondistended. No rebound, guarding, rigidity. Normoactive bowel sounds present in all four quadrants. No organomegaly or mass. EXTREMITIES: Full range of motion. No pedal edema, cyanosis, or clubbing. NEUROLOGIC: Cranial nerves II through XII are grossly intact with no focal sensorimotor deficit. Muscle strength 5/5 in all extremities. Sensation intact. Gait not checked. PSYCHIATRIC: The patient is alert and oriented x 3. Normal affect, mood, thought content. SKIN: Warm, dry, and intact without obvious rash, lesion, or ulcer.  LABORATORY PANEL:  CBC  Recent Labs Lab 12/13/15 1512  WBC 15.4*  HGB 13.7  HCT 40.8  PLT 142*   ----------------------------------------------------------------------------------------------------------------- Chemistries  Recent Labs Lab 12/13/15 1512  NA 138  K 4.1  CL 99*  CO2 33*  GLUCOSE 49*  BUN 34*  CREATININE 1.22  CALCIUM 8.3*  AST 27  ALT 20  ALKPHOS 57  BILITOT 1.1   ------------------------------------------------------------------------------------------------------------------ Cardiac Enzymes  Recent Labs Lab 12/13/15 1512  TROPONINI 0.04*   ------------------------------------------------------------------------------------------------------------------  RADIOLOGY: Dg Chest Portable 1 View  Result Date: 12/13/2015 CLINICAL DATA:  Shortness of Breath EXAM: PORTABLE CHEST 1 VIEW COMPARISON:  12/09/2015 FINDINGS: Cardiomegaly again noted. Status post median sternotomy. Single lead cardiac pacemaker is unchanged in position. Right arm PICC line has been removed. No infiltrate or pulmonary  edema. Mild basilar atelectasis. IMPRESSION: Cardiomegaly again noted. Right PICC line has been removed. No infiltrate or pulmonary edema. Mild basilar atelectasis. Electronically Signed   By: Natasha MeadLiviu  Pop M.D.   On: 12/13/2015 15:56    EKG: Afib at 4787 with incomplete RBBB  IMPRESSION AND PLAN:  This is a 82 y.o. male with a history of afib, CHF, COPD now being admitted with: 1. Acute exacerbation of COPD - recently admitted and discharged for the same, however still having cough, wheezing and shortness of breath refractory to home medications which included zithromax, prednisone and nebs.  We will admit to med-surg for steroid, nebs, O2, repeat sputum cultures.  2. Wallace CullensGray zone troponin - likely demand ischemia, but will trend trops.  3. CKD - at baseline. Monitor Continue other home medications.   Fluids: IVNS Diet/Nutrition: Carb controlled/heart healthy DVT Px: Coumadin and SCDs  All the records are reviewed and case discussed with ED provider. Management plans discussed with the patient and/or family who express understanding and agree with plan of care.  CODE STATUS: DNR TOTAL TIME TAKING CARE OF THIS PATIENT: 50 minutes.   [employer] Holding Corporationlexis Brylan Dec D.O.  on 12/13/2015 at 7:24 PM Between 7am to 6pm - Pager - [phone] After 6pm go to [email or web address] - Social research officer, government [facility] [place] [facility] Office [phone] CC: Primary care physician; Barbette Reichmann, MD     Note: This dictation was prepared with Dragon dictation along with smaller phrase technology. Any transcriptional errors that result from this process are unintentional.

## 2015-12-13 NOTE — ED Notes (Signed)
Pt CBG 44.  MD notified and order to give patient orange juice and dextrose placed and performed.

## 2015-12-13 NOTE — ED Triage Notes (Signed)
Pt to ED via EMS from home c/o generalized weakness and COPD for several days.  Pt reports feeling weak for several days, denies LOC or falling.  Recently hospitalized and released for COPD exacerbation.  Pt reports SOB but at baseline with COPD.  Pt presents A&Ox4, chest rise even and unlabored, speaking in complete and coherent sentences and in NAD at this time.

## 2015-12-14 LAB — TROPONIN I
TROPONIN I: 0.03 ng/mL — AB (ref <0.03)
TROPONIN I: 0.03 ng/mL — AB (ref <0.03)

## 2015-12-14 LAB — CBC
HEMATOCRIT: 38.2 % — AB (ref 40.0–52.0)
HEMOGLOBIN: 12.8 g/dL — AB (ref 13.0–18.0)
MCH: 30.8 pg (ref 26.0–34.0)
MCHC: 33.5 g/dL (ref 32.0–36.0)
MCV: 92 fL (ref 80.0–100.0)
Platelets: 126 10*3/uL — ABNORMAL LOW (ref 150–440)
RBC: 4.16 MIL/uL — AB (ref 4.40–5.90)
RDW: 15.3 % — ABNORMAL HIGH (ref 11.5–14.5)
WBC: 13.4 10*3/uL — AB (ref 3.8–10.6)

## 2015-12-14 LAB — BASIC METABOLIC PANEL
Anion gap: 7 (ref 5–15)
BUN: 38 mg/dL — ABNORMAL HIGH (ref 6–20)
CHLORIDE: 100 mmol/L — AB (ref 101–111)
CO2: 28 mmol/L (ref 22–32)
Calcium: 8 mg/dL — ABNORMAL LOW (ref 8.9–10.3)
Creatinine, Ser: 1.08 mg/dL (ref 0.61–1.24)
GFR calc non Af Amer: 58 mL/min — ABNORMAL LOW (ref >60)
Glucose, Bld: 440 mg/dL — ABNORMAL HIGH (ref 65–99)
POTASSIUM: 5.8 mmol/L — AB (ref 3.5–5.1)
SODIUM: 135 mmol/L (ref 135–145)

## 2015-12-14 LAB — GLUCOSE, CAPILLARY
GLUCOSE-CAPILLARY: 448 mg/dL — AB (ref 65–99)
GLUCOSE-CAPILLARY: 422 mg/dL — AB (ref 65–99)
GLUCOSE-CAPILLARY: 457 mg/dL — AB (ref 65–99)
GLUCOSE-CAPILLARY: 459 mg/dL — AB (ref 65–99)
Glucose-Capillary: 440 mg/dL — ABNORMAL HIGH (ref 65–99)

## 2015-12-14 LAB — PROTIME-INR
INR: 1.61
Prothrombin Time: 19.3 seconds — ABNORMAL HIGH (ref 11.4–15.2)

## 2015-12-14 MED ORDER — METHYLPREDNISOLONE SODIUM SUCC 125 MG IJ SOLR
60.0000 mg | Freq: Two times a day (BID) | INTRAMUSCULAR | Status: DC
  Administered 2015-12-14 – 2015-12-15 (×2): 60 mg via INTRAVENOUS
  Filled 2015-12-14 (×2): qty 2

## 2015-12-14 MED ORDER — CETYLPYRIDINIUM CHLORIDE 0.05 % MT LIQD
7.0000 mL | Freq: Two times a day (BID) | OROMUCOSAL | Status: DC
  Administered 2015-12-14 – 2015-12-16 (×5): 7 mL via OROMUCOSAL

## 2015-12-14 MED ORDER — ALBUTEROL SULFATE (2.5 MG/3ML) 0.083% IN NEBU
2.5000 mg | INHALATION_SOLUTION | Freq: Three times a day (TID) | RESPIRATORY_TRACT | Status: DC
  Administered 2015-12-14 – 2015-12-16 (×7): 2.5 mg via RESPIRATORY_TRACT
  Filled 2015-12-14 (×7): qty 3

## 2015-12-14 MED ORDER — INSULIN GLARGINE 100 UNIT/ML ~~LOC~~ SOLN
20.0000 [IU] | Freq: Every day | SUBCUTANEOUS | Status: DC
  Administered 2015-12-14 – 2015-12-16 (×3): 20 [IU] via SUBCUTANEOUS
  Filled 2015-12-14 (×4): qty 0.2

## 2015-12-14 MED ORDER — SODIUM POLYSTYRENE SULFONATE 15 GM/60ML PO SUSP
30.0000 g | Freq: Once | ORAL | Status: AC
Start: 2015-12-14 — End: 2015-12-14
  Administered 2015-12-14: 30 g via ORAL
  Filled 2015-12-14: qty 120

## 2015-12-14 NOTE — Care Management Obs Status (Signed)
MEDICARE OBSERVATION STATUS NOTIFICATION   Patient Details  Name: Troy BurnRobert [name] MRN: [id number] Date of Birth: January 16, 1924   Medicare Observation Status Notification Given:  Yes    Eber HongGreene, Hamdi Vari R, RN 12/14/2015, 12:54 PM

## 2015-12-14 NOTE — Plan of Care (Signed)
Spoke with MD regarding BBG 457. Given 12 units novolog and monitor. No further orders received.

## 2015-12-14 NOTE — Progress Notes (Signed)
CSW was consulted stating patient has a HH aide. CSW informed RNCM. CSW is signing off but is available if a CSW need were to arise.  Woodroe Modehristina Lynx Goodrich, MSW, LCSW, LCAS-A Clinical Social Worker [phone]

## 2015-12-14 NOTE — Progress Notes (Signed)
ANTICOAGULATION CONSULT NOTE - Initial Consult  Pharmacy Consult for Warfarin  Indication: atrial fibrillation  No Known Allergies  Patient Measurements: Height: 5\' 9"  (175.3 cm) Weight: 260 lb (117.9 kg) IBW/kg (Calculated) : 70.7 Heparin Dosing Weight:   Vital Signs: Temp: 97.7 F (36.5 C) (08/11 0507) Temp Source: Oral (08/11 0507) BP: 129/74 (08/11 0507) Pulse Rate: 71 (08/11 0507)  Labs:  Recent Labs  12/13/15 1512 12/13/15 2109 12/14/15 0255 12/14/15 0656  HGB 13.7  --   --  12.8*  HCT 40.8  --   --  38.2*  PLT 142*  --   --  126*  LABPROT 20.7*  --   --  19.3*  INR 1.75  --   --  1.61  CREATININE 1.22  --   --  1.08  TROPONINI 0.04* 0.03* 0.03* 0.03*    Estimated Creatinine Clearance: 57.6 mL/min (by C-G formula based on SCr of 1.08 mg/dL).   Medical History: Past Medical History:  Diagnosis Date  . Chronic atrial fibrillation (HCC)    a. on Couamdin; b. CHADS2VASc at least 7 (CHF, HTN, age x 2, DM, stroke x 2); c. s/p MAZE 1999  . Chronic systolic CHF (congestive heart failure) (HCC)    a. echo 08/01/2015: EF of 45%, mild LVH, mitral valve with annular calcification, mitral valve partially mobile  . CKD (chronic kidney disease), stage III   . COPD (chronic obstructive pulmonary disease) (HCC)   . Diabetes mellitus without complication (HCC)   . Hypertension   . Hypothyroidism   . Legally blind   . Mitral valve prolapse    a. s/p mitral valve repair 1999  . Normal coronary arteries    a. by cardiac cath 1999  . Parkinson's disease (HCC)   . Stroke (HCC)   . TIA (transient ischemic attack)   . UTI (lower urinary tract infection)     Medications:  Prescriptions Prior to Admission  Medication Sig Dispense Refill Last Dose  . albuterol (PROVENTIL HFA) 108 (90 Base) MCG/ACT inhaler Inhale 2 puffs into the lungs every 4 (four) hours as needed for wheezing or shortness of breath. (Patient taking differently: Inhale 2 puffs into the lungs 4 (four) times  daily. ) 1 Inhaler 0 12/13/2015 at pm  . albuterol (PROVENTIL) (2.5 MG/3ML) 0.083% nebulizer solution Take 2.5 mg by nebulization 4 (four) times daily.    12/13/2015 at pm  . azithromycin (ZITHROMAX) 250 MG tablet One tablet daily until finished (Patient taking differently: Take 250-500 mg by mouth daily. 500 mg on day 1, then 250 mg daily for 4 days, then stop (started on 12/11/15)) 3 each 0 12/13/2015 at am  . carbidopa-levodopa (SINEMET IR) 25-250 MG tablet Take 1 tablet by mouth 3 (three) times daily.   12/13/2015 at pm  . cetirizine (ZYRTEC) 10 MG tablet Take 10 mg by mouth daily.   12/13/2015 at am  . furosemide (LASIX) 40 MG tablet Take 20-40 mg by mouth 2 (two) times daily. 40 mg every morning and 20 mg every evening   12/13/2015 at am  . glipiZIDE (GLUCOTROL XL) 10 MG 24 hr tablet Take 5-10 mg by mouth 2 (two) times daily. 10 mg every morning and 5 mg every evening   12/13/2015 at am  . insulin regular human CONCENTRATED (HUMULIN R) 500 UNIT/ML injection Inject 8-12 Units into the skin 2 (two) times daily with a meal. 12 units every morning and 8 units every evening   12/13/2015 at am  . lisinopril (PRINIVIL,ZESTRIL)  40 MG tablet Take 40 mg by mouth at bedtime.    12/12/2015 at pm  . metoprolol succinate (TOPROL-XL) 25 MG 24 hr tablet Take 1 tablet (25 mg total) by mouth every evening. Take with or immediately following a meal. 30 tablet 0 12/12/2015 at 1830  . potassium chloride (MICRO-K) 10 MEQ CR capsule Take 10 mEq by mouth daily.   12/13/2015 at am  . predniSONE (DELTASONE) 5 MG tablet Take 5 mg by mouth daily with breakfast.   12/13/2015 at am  . saxagliptin HCl (ONGLYZA) 5 MG TABS tablet Take 5 mg by mouth daily.   12/13/2015 at am  . warfarin (COUMADIN) 3 MG tablet Take 3 mg by mouth every evening. Pt takes on Tuesday, Thursday, Saturday and Sunday   12/11/2015 at 1830  . warfarin (COUMADIN) 5 MG tablet Take 5 mg by mouth every evening. Takes Monday, Wednesday, and Friday   12/12/2015 at 1830  .  fluticasone-salmeterol (ADVAIR HFA) 115-21 MCG/ACT inhaler Inhale 2 puffs into the lungs 2 (two) times daily. (Patient not taking: Reported on 12/13/2015) 1 Inhaler 0 Not Taking at Unknown time  . glipiZIDE (GLUCOTROL XL) 5 MG 24 hr tablet Take 1 tablet (5 mg total) by mouth daily with breakfast. (Patient not taking: Reported on 12/13/2015) 30 tablet 0 Not Taking at Unknown time  . insulin glargine (LANTUS) 100 UNIT/ML injection Inject 0.24 mLs (24 Units total) into the skin daily. (Patient not taking: Reported on 12/13/2015) 10 mL 0 Not Taking at Unknown time  . polyethylene glycol (MIRALAX / GLYCOLAX) packet Take 17 g by mouth daily. (Patient not taking: Reported on 12/13/2015) 30 each 0 Not Taking at Unknown time  . predniSONE (DELTASONE) 5 MG tablet 6 tablets po day1; 5 tablets po day 2; 4 tablets po day3; 3 tablets po day 4; 2 tablets day5; 1 tablets daily afterwards (Patient not taking: Reported on 12/13/2015) 40 tablet 0 Not Taking at Unknown time    Assessment: Pt was recently hospitalized for COPD exacerbation , was therapeutic then on current dose of warfarin.   Patients home dose:  Warfarin 3 mg PO Tues-Thurs-Sat-Sun.  Warfarin 5 mg PO MWF.   8/10:  INR = 1.8 8/11 INR= 1.61  Goal of Therapy:  INR 2-3   Plan:  Will continue patients warfarin  home dose today.If still subtherapeutic tomorrow recommend warfarin  in place of warfarin  dose x1.    Will recheck INR with AM labs.   Cher Nakai, PharmD Clinical Pharmacist 12/14/2015 8:22 AM

## 2015-12-14 NOTE — Progress Notes (Signed)
PT Cancellation Note  Patient Details Name: Troy [name] MRN: [id number] DOB: 11-11-1923   Cancelled Treatment:    Reason Eval/Treat Not Completed: Other (comment).  Pt's potassium noted to be elevated today (5.8).  Per PT guidelines (for elevated potassium), will hold PT at this time and re-attempt PT eval at a later date/time as medically appropriate.   Troy [name] 12/14/2015, 2:48 PM Troy [name], PT [phone]

## 2015-12-14 NOTE — Progress Notes (Signed)
Inpatient Diabetes Program Recommendations  AACE/ADA: New Consensus Statement on Inpatient Glycemic Control (2015)  Target Ranges:  Prepandial:   less than 140 mg/dL      Peak postprandial:   less than 180 mg/dL (1-2 hours)      Critically ill patients:  140 - 180 mg/dL   Lab Results  Component Value Date   GLUCAP 208 (H) 12/13/2015   HGBA1C 8.4 (H) 12/08/2015    Review of Glycemic Control  Results for Troy [name], [name] (MRN [id number]) as of 12/14/2015 08:19  Ref. Range 12/13/2015 16:31 12/13/2015 17:10 12/13/2015 20:16 12/13/2015 22:16 12/14/2015 08:03  Glucose-Capillary Latest Ref Range: 65 - 99 mg/dL 44 (LL) 90 86 409208 (H) 811448 (H)    Home DM Meds: Glipizide 10 mg am/ 5 mg pm,  Lantus 24 units/day, Onglyza 3 mg daily  Current Insulin Orders:  Novolog 0-12 units tid, Tradjenta 5 mg daily  -Patient currently getting IV Solumedrol 60 mg Q6 hours.  Please consider starting Lantus 20 units qday starting now, Novolog 4 units tid with meals, please change current Novolog correction scale to resistant scale 0-20 units tid and add Novolog 0-5 units qhs. (these were his medications when he was admitted 2 days ago)  Spoke to [employer]a who is calling MD about recent CBG.   Susette RacerJulie Akyra Bouchie, RN, BA, MHA, CDE Diabetes Coordinator Inpatient Diabetes Program  [phone] (Team Pager) [phone] [facility] Office) 12/14/2015 8:22 AM

## 2015-12-14 NOTE — Progress Notes (Signed)
[facility] - [place] at [facility]   PATIENT NAME: Troy [name]    MR#:  [id number]  DATE OF BIRTH:  10/28/1923  SUBJECTIVE:  CHIEF COMPLAINT:   Chief Complaint  Patient presents with  . Weakness  . COPD    We will was treated and discharged for COPD 2 days ago from hospital, he was on chronic 5 mg prednisone at home. Discharge was prescribed tapering steroids to take for 5 days, but instead of that he continued taking his baseline with 5 mg daily. Within 2 days he started having more shortness of breath and hypoxic up to 70 on room air so brought to emergency room again.  Feels little better now, on nasal cannula support.  REVIEW OF SYSTEMS:  CONSTITUTIONAL: No fever, fatigue or weakness.  EYES: No blurred or double vision.  EARS, NOSE, AND THROAT: No tinnitus or ear pain.  RESPIRATORY: No cough, positive for shortness of breath, wheezing or hemoptysis.  CARDIOVASCULAR: No chest pain, orthopnea, edema.  GASTROINTESTINAL: No nausea, vomiting, diarrhea or abdominal pain.  GENITOURINARY: No dysuria, hematuria.  ENDOCRINE: No polyuria, nocturia,  HEMATOLOGY: No anemia, easy bruising or bleeding SKIN: No rash or lesion. MUSCULOSKELETAL: No joint pain or arthritis.   NEUROLOGIC: No tingling, numbness, weakness.  PSYCHIATRY: No anxiety or depression.   ROS  DRUG ALLERGIES:  No Known Allergies  VITALS:  Blood pressure 131/70, pulse 75, temperature 97.7 F (36.5 C), temperature source Oral, resp. rate 16, height 5\' 9"  (1.753 m), weight 117.9 kg (260 lb), SpO2 94 %.  PHYSICAL EXAMINATION:  GENERAL:  82 y.o.-year-old patient lying in the bed with no acute distress.  EYES: Pupils equal, round, reactive to light and accommodation. No scleral icterus. Extraocular muscles intact.  HEENT: Head atraumatic, normocephalic. Oropharynx and nasopharynx clear.  NECK:  Supple, no jugular venous distention. No thyroid enlargement, no tenderness.  LUNGS: Normal breath sounds  bilaterally, mild wheezing, no rales,rhonchi or crepitation. No use of accessory muscles of respiration.  CARDIOVASCULAR: S1, S2 normal. No murmurs, rubs, or gallops.  ABDOMEN: Soft, nontender, nondistended. Bowel sounds present. No organomegaly or mass.  EXTREMITIES: No pedal edema, cyanosis, or clubbing.  NEUROLOGIC: Cranial nerves II through XII are intact. Muscle strength 4/5 in all extremities. Sensation intact. Gait not checked.  PSYCHIATRIC: The patient is alert and oriented x 3.  SKIN: No obvious rash, lesion, or ulcer.   Physical Exam LABORATORY PANEL:   CBC  Recent Labs Lab 12/14/15 0656  WBC 13.4*  HGB 12.8*  HCT 38.2*  PLT 126*   ------------------------------------------------------------------------------------------------------------------  Chemistries   Recent Labs Lab 12/13/15 1512 12/14/15 0656  NA 138 135  K 4.1 5.8*  CL 99* 100*  CO2 33* 28  GLUCOSE 49* 440*  BUN 34* 38*  CREATININE 1.22 1.08  CALCIUM 8.3* 8.0*  AST 27  --   ALT 20  --   ALKPHOS 57  --   BILITOT 1.1  --    ------------------------------------------------------------------------------------------------------------------  Cardiac Enzymes  Recent Labs Lab 12/14/15 0255 12/14/15 0656  TROPONINI 0.03* 0.03*   ------------------------------------------------------------------------------------------------------------------  RADIOLOGY:  Dg Chest Portable 1 View  Result Date: 12/13/2015 CLINICAL DATA:  Shortness of Breath EXAM: PORTABLE CHEST 1 VIEW COMPARISON:  12/09/2015 FINDINGS: Cardiomegaly again noted. Status post median sternotomy. Single lead cardiac pacemaker is unchanged in position. Right arm PICC line has been removed. No infiltrate or pulmonary edema. Mild basilar atelectasis. IMPRESSION: Cardiomegaly again noted. Right PICC line has been removed. No infiltrate or pulmonary edema. Mild  basilar atelectasis. Electronically Signed   By: Natasha Mead M.D.   On: 12/13/2015  15:56    ASSESSMENT AND PLAN:   Active Problems:   COPD exacerbation (HCC)  * COPD exacerbation   We'll give IV steroid and nebulizer therapy.   Allergies to follow instructions properly after discharge this time.   No need for antibiotic this time.  * Hyperkalemia   Kayexalate 1 dose for now and monitor.  * Hyperglycemia with history of diabetes.   We will add basal insulin and keep on sliding scale coverage.  * Chronic A fib   Metoprolol and Coumadin   Pharmacy to manage the dose.  * Ch systolic CHF   No acute symptoms   Lasix oral    All the records are reviewed and case discussed with Care Management/Social Workerr. Management plans discussed with the patient, family and they are in agreement.  CODE STATUS: DNR  TOTAL TIME TAKING CARE OF THIS PATIENT: 35 minutes.     POSSIBLE D/C IN 2 DAYS, DEPENDING ON CLINICAL CONDITION.   Altamese Dilling M.D on 12/14/2015   Between 7am to 6pm - Pager - [phone]  After 6pm go to [email or web address] - password [email or web address]  [facility] [place] [facility]  Office  [phone]  CC: Primary care physician; Barbette Reichmann, MD  Note: This dictation was prepared with Dragon dictation along with smaller phrase technology. Any transcriptional errors that result from this process are unintentional.

## 2015-12-14 NOTE — Care Management (Signed)
Patient placed in observation for exac of copd. Patient discharged from [facility] 8/8 after stay for  exac copd.  At time of discharge, patient ambulating and did not require any home health physical therapy.  For the past  36 hours, the patient not been able to stand or take any steps.  In the ED his  02 sats dropped to 85% on 2 liters nasal cannula while walking and wife reported on 3 liters at home sats were 70%.  Patient does have chronic 02 and has been followed by [facility].  Patient has had 5 admissions in past 6 months.   Obtained order for physical therapy and requesting palliative care for goals of treatment and sx management.  Wife says  patient unable to walk due to weakness  and that comes form his shortness of breath from his copd and CHF.  Patient has been followed bu [facility] and if patient is able to return home, they would want this agency.   Discussed the possibility of skilled nursing placement.  Wife says that patient has told everyone he is a DNR.  Updated attending.  Patient would benefit from outpatient palliative care at discharge

## 2015-12-15 LAB — BASIC METABOLIC PANEL
Anion gap: 6 (ref 5–15)
BUN: 45 mg/dL — AB (ref 6–20)
CHLORIDE: 98 mmol/L — AB (ref 101–111)
CO2: 31 mmol/L (ref 22–32)
Calcium: 8.4 mg/dL — ABNORMAL LOW (ref 8.9–10.3)
Creatinine, Ser: 1.29 mg/dL — ABNORMAL HIGH (ref 0.61–1.24)
GFR calc Af Amer: 55 mL/min — ABNORMAL LOW (ref >60)
GFR calc non Af Amer: 47 mL/min — ABNORMAL LOW (ref >60)
GLUCOSE: 381 mg/dL — AB (ref 65–99)
POTASSIUM: 5 mmol/L (ref 3.5–5.1)
Sodium: 135 mmol/L (ref 135–145)

## 2015-12-15 LAB — GLUCOSE, CAPILLARY
GLUCOSE-CAPILLARY: 458 mg/dL — AB (ref 65–99)
GLUCOSE-CAPILLARY: 466 mg/dL — AB (ref 65–99)
Glucose-Capillary: 389 mg/dL — ABNORMAL HIGH (ref 65–99)
Glucose-Capillary: 390 mg/dL — ABNORMAL HIGH (ref 65–99)
Glucose-Capillary: 453 mg/dL — ABNORMAL HIGH (ref 65–99)
Glucose-Capillary: 565 mg/dL (ref 65–99)

## 2015-12-15 LAB — PROTIME-INR
INR: 1.67
Prothrombin Time: 19.9 seconds — ABNORMAL HIGH (ref 11.4–15.2)

## 2015-12-15 MED ORDER — PREDNISONE 50 MG PO TABS
50.0000 mg | ORAL_TABLET | Freq: Every day | ORAL | Status: DC
  Administered 2015-12-16: 50 mg via ORAL
  Filled 2015-12-15: qty 1

## 2015-12-15 MED ORDER — WARFARIN SODIUM 5 MG PO TABS
5.0000 mg | ORAL_TABLET | Freq: Once | ORAL | Status: AC
  Administered 2015-12-15: 17:00:00 5 mg via ORAL
  Filled 2015-12-15: qty 1

## 2015-12-15 MED ORDER — INSULIN ASPART 100 UNIT/ML ~~LOC~~ SOLN
4.0000 [IU] | Freq: Once | SUBCUTANEOUS | Status: AC
  Administered 2015-12-15: 4 [IU] via SUBCUTANEOUS
  Filled 2015-12-15: qty 4

## 2015-12-15 NOTE — Progress Notes (Signed)
[facility] - [place] at [facility]   PATIENT NAME: Troy [name]    MR#:  [id number]  DATE OF BIRTH:  10/16/1923  SUBJECTIVE:  CHIEF COMPLAINT:   Chief Complaint  Patient presents with  . Weakness  . COPD    We will was treated and discharged for COPD 2 days ago from hospital, he was on chronic 5 mg prednisone at home. Discharge was prescribed tapering steroids to take for 5 days, but instead of that he continued taking his baseline with 5 mg daily. Within 2 days he started having more shortness of breath and hypoxic up to 70 on room air so brought to emergency room again.  Feels little better now, on nasal cannula support. Have hyperglycemia due to steroids.  REVIEW OF SYSTEMS:    CONSTITUTIONAL: No fever, fatigue or weakness.  EYES: No blurred or double vision.  EARS, NOSE, AND THROAT: No tinnitus or ear pain.  RESPIRATORY: No cough, positive for shortness of breath, wheezing or hemoptysis.  CARDIOVASCULAR: No chest pain, orthopnea, edema.  GASTROINTESTINAL: No nausea, vomiting, diarrhea or abdominal pain.  GENITOURINARY: No dysuria, hematuria.  ENDOCRINE: No polyuria, nocturia,  HEMATOLOGY: No anemia, easy bruising or bleeding SKIN: No rash or lesion. MUSCULOSKELETAL: No joint pain or arthritis.   NEUROLOGIC: No tingling, numbness, weakness.  PSYCHIATRY: No anxiety or depression.   ROS  DRUG ALLERGIES:  No Known Allergies  VITALS:  Blood pressure (!) 158/80, pulse 69, temperature 97.5 F (36.4 C), temperature source Oral, resp. rate 20, height  (1.753 m), weight 117.9 kg (260 lb), SpO2 96 %.  PHYSICAL EXAMINATION:  GENERAL:  82 y.o.-year-old patient lying in the bed with no acute distress.  EYES: Pupils equal, round, reactive to light and accommodation. No scleral icterus. Extraocular muscles intact.  HEENT: Head atraumatic, normocephalic. Oropharynx and nasopharynx clear.  NECK:  Supple, no jugular venous distention. No thyroid enlargement,  no tenderness.  LUNGS: Normal breath sounds bilaterally, mild wheezing, no rales,rhonchi or crepitation. No use of accessory muscles of respiration.  CARDIOVASCULAR: S1, S2 normal. No murmurs, rubs, or gallops.  ABDOMEN: Soft, nontender, nondistended. Bowel sounds present. No organomegaly or mass.  EXTREMITIES: No pedal edema, cyanosis, or clubbing.  NEUROLOGIC: Cranial nerves II through XII are intact. Muscle strength 4/5 in all extremities. Sensation intact. Gait not checked.  PSYCHIATRIC: The patient is alert and oriented x 3.  SKIN: No obvious rash, lesion, or ulcer.   Physical Exam LABORATORY PANEL:   CBC  Recent Labs Lab 12/14/15 0656  WBC 13.4*  HGB 12.8*  HCT 38.2*  PLT 126*   ------------------------------------------------------------------------------------------------------------------  Chemistries   Recent Labs Lab 12/13/15 1512  12/15/15 0423  NA 138  < > 135  K 4.1  < > 5.0  CL 99*  < > 98*  CO2 33*  < > 31  GLUCOSE 49*  < > 381*  BUN 34*  < > 45*  CREATININE 1.22  < > 1.29*  CALCIUM 8.3*  < > 8.4*  AST 27  --   --   ALT 20  --   --   ALKPHOS 57  --   --   BILITOT 1.1  --   --   < > = values in this interval not displayed. ------------------------------------------------------------------------------------------------------------------  Cardiac Enzymes  Recent Labs Lab 12/14/15 0255 12/14/15 0656  TROPONINI 0.03* 0.03*   ------------------------------------------------------------------------------------------------------------------  RADIOLOGY:  Dg Chest Portable 1 View  Result Date: 12/13/2015 CLINICAL DATA:  Shortness of Breath EXAM: PORTABLE  CHEST 1 VIEW COMPARISON:  12/09/2015 FINDINGS: Cardiomegaly again noted. Status post median sternotomy. Single lead cardiac pacemaker is unchanged in position. Right arm PICC line has been removed. No infiltrate or pulmonary edema. Mild basilar atelectasis. IMPRESSION: Cardiomegaly again noted. Right  PICC line has been removed. No infiltrate or pulmonary edema. Mild basilar atelectasis. Electronically Signed   By: Natasha MeadLiviu  Pop M.D.   On: 12/13/2015 15:56    ASSESSMENT AND PLAN:   Active Problems:   COPD exacerbation (HCC)  * COPD exacerbation   IV steroid and nebulizer therapy.   Allergies to follow instructions properly after discharge this time.   No need for antibiotic this time.  * Hyperkalemia   Kayexalate 1 dose for now and monitor.   Stop oral supplement he was taking.  * Hyperglycemia with history of diabetes.   We will add basal insulin and keep on sliding scale coverage.   Change steroid to oral.  * Chronic A fib   Metoprolol and Coumadin   Pharmacy to manage the dose.  * Ch systolic CHF   No acute symptoms   Lasix oral    All the records are reviewed and case discussed with Care Management/Social Workerr. Management plans discussed with the patient, family and they are in agreement.  CODE STATUS: DNR  TOTAL TIME TAKING CARE OF THIS PATIENT: 35 minutes.   PT suggested no further needs.  POSSIBLE D/C IN 1 DAYS, DEPENDING ON CLINICAL CONDITION.   Altamese DillingVACHHANI, Keniesha Adderly M.D on 12/15/2015   Between 7am to 6pm - Pager - [phone]  After 6pm go to [email or web address] - password [email or web address]  [facility] [place] [facility]  Office  [phone]  CC: Primary care physician; Barbette ReichmannHANDE,VISHWANATH, MD  Note: This dictation was prepared with Dragon dictation along with smaller phrase technology. Any transcriptional errors that result from this process are unintentional.

## 2015-12-15 NOTE — Progress Notes (Signed)
Dr Elisabeth PigeonVachhani notified of patient's blood sugar 453. MD acknowledged, no new orders.

## 2015-12-15 NOTE — Progress Notes (Signed)
Reported to Dr Elisabeth PigeonVachhani that patient had blood sugar of 565. MD acknowledged, ordered additional 4 units of novolog once.

## 2015-12-15 NOTE — Progress Notes (Signed)
ANTICOAGULATION CONSULT NOTE - Initial Consult  Pharmacy Consult for Warfarin  Indication: atrial fibrillation  No Known Allergies  Patient Measurements: Height:  (175.3 cm) Weight: 260 lb (117.9 kg) IBW/kg (Calculated) : 70.7 Heparin Dosing Weight:   Vital Signs: Temp: 97.5 F (36.4 C) (08/12 0433) Temp Source: Oral (08/12 0433) BP: 158/80 (08/12 0433) Pulse Rate: 69 (08/12 0433)  Labs:  Recent Labs  12/13/15 1512 12/13/15 2109 12/14/15 0255 12/14/15 0656 12/15/15 0423  HGB 13.7  --   --  12.8*  --   HCT 40.8  --   --  38.2*  --   PLT 142*  --   --  126*  --   LABPROT 20.7*  --   --  19.3* 19.9*  INR 1.75  --   --  1.61 1.67  CREATININE 1.22  --   --  1.08 1.29*  TROPONINI 0.04* 0.03* 0.03* 0.03*  --     Estimated Creatinine Clearance: 48.2 mL/min (by C-G formula based on SCr of 1.29 mg/dL).   Medical History: Past Medical History:  Diagnosis Date  . Chronic atrial fibrillation (HCC)    a. on Couamdin; b. CHADS2VASc at least 7 (CHF, HTN, age x 2, DM, stroke x 2); c. s/p MAZE 1999  . Chronic systolic CHF (congestive heart failure) (HCC)    a. echo 08/01/2015: EF of 45%, mild LVH, mitral valve with annular calcification, mitral valve partially mobile  . CKD (chronic kidney disease), stage III   . COPD (chronic obstructive pulmonary disease) (HCC)   . Diabetes mellitus without complication (HCC)   . Hypertension   . Hypothyroidism   . Legally blind   . Mitral valve prolapse    a. s/p mitral valve repair 1999  . Normal coronary arteries    a. by cardiac cath 1999  . Parkinson's disease (HCC)   . Stroke (HCC)   . TIA (transient ischemic attack)   . UTI (lower urinary tract infection)     Medications:  Prescriptions Prior to Admission  Medication Sig Dispense Refill Last Dose  . albuterol (PROVENTIL HFA) 108 (90 Base) MCG/ACT inhaler Inhale 2 puffs into the lungs every 4 (four) hours as needed for wheezing or shortness of breath. (Patient taking  differently: Inhale 2 puffs into the lungs 4 (four) times daily. ) 1 Inhaler 0 12/13/2015 at pm  . albuterol (PROVENTIL) (2.5 MG/3ML) 0.083% nebulizer solution Take 2.5 mg by nebulization 4 (four) times daily.    12/13/2015 at pm  . azithromycin (ZITHROMAX) 250 MG tablet One tablet daily until finished (Patient taking differently: Take 250-500 mg by mouth daily. 500 mg on day 1, then 250 mg daily for 4 days, then stop (started on 12/11/15)) 3 each 0 12/13/2015 at am  . carbidopa-levodopa (SINEMET IR) 25-250 MG tablet Take 1 tablet by mouth 3 (three) times daily.   12/13/2015 at pm  . cetirizine (ZYRTEC) 10 MG tablet Take 10 mg by mouth daily.   12/13/2015 at am  . furosemide (LASIX) 40 MG tablet Take 20-40 mg by mouth 2 (two) times daily. 40 mg every morning and 20 mg every evening   12/13/2015 at am  . glipiZIDE (GLUCOTROL XL) 10 MG 24 hr tablet Take 5-10 mg by mouth 2 (two) times daily. 10 mg every morning and 5 mg every evening   12/13/2015 at am  . insulin regular human CONCENTRATED (HUMULIN R) 500 UNIT/ML injection Inject 8-12 Units into the skin 2 (two) times daily with a meal. 12  units every morning and 8 units every evening   12/13/2015 at am  . lisinopril (PRINIVIL,ZESTRIL) 40 MG tablet Take 40 mg by mouth at bedtime.    12/12/2015 at pm  . metoprolol succinate (TOPROL-XL) 25 MG 24 hr tablet Take 1 tablet (25 mg total) by mouth every evening. Take with or immediately following a meal. 30 tablet 0 12/12/2015 at 1830  . potassium chloride (MICRO-K) 10 MEQ CR capsule Take 10 mEq by mouth daily.   12/13/2015 at am  . predniSONE (DELTASONE) 5 MG tablet Take 5 mg by mouth daily with breakfast.   12/13/2015 at am  . saxagliptin HCl (ONGLYZA) 5 MG TABS tablet Take 5 mg by mouth daily.   12/13/2015 at am  . warfarin (COUMADIN) 3 MG tablet Take 3 mg by mouth every evening. Pt takes on Tuesday, Thursday, Saturday and Sunday   12/11/2015 at 1830  . warfarin (COUMADIN) 5 MG tablet Take 5 mg by mouth every evening. Takes  Monday, Wednesday, and Friday   12/12/2015 at 1830  . fluticasone-salmeterol (ADVAIR HFA) 115-21 MCG/ACT inhaler Inhale 2 puffs into the lungs 2 (two) times daily. (Patient not taking: Reported on 12/13/2015) 1 Inhaler 0 Not Taking at Unknown time  . glipiZIDE (GLUCOTROL XL) 5 MG 24 hr tablet Take 1 tablet (5 mg total) by mouth daily with breakfast. (Patient not taking: Reported on 12/13/2015) 30 tablet 0 Not Taking at Unknown time  . insulin glargine (LANTUS) 100 UNIT/ML injection Inject 0.24 mLs (24 Units total) into the skin daily. (Patient not taking: Reported on 12/13/2015) 10 mL 0 Not Taking at Unknown time  . polyethylene glycol (MIRALAX / GLYCOLAX) packet Take 17 g by mouth daily. (Patient not taking: Reported on 12/13/2015) 30 each 0 Not Taking at Unknown time  . predniSONE (DELTASONE) 5 MG tablet 6 tablets po day1; 5 tablets po day 2; 4 tablets po day3; 3 tablets po day 4; 2 tablets day5; 1 tablets daily afterwards (Patient not taking: Reported on 12/13/2015) 40 tablet 0 Not Taking at Unknown time    Assessment: Pt was recently hospitalized for COPD exacerbation , was therapeutic then on current dose of warfarin.   Patients home dose:  Warfarin 3 mg PO Tues-Thurs-Sat-Sun.  Warfarin 5 mg PO MWF.   8/10:  INR = 1.75, 3 mg 8/11 INR= 1.61, 5 mg 8/12 INR= 1.67,   Goal of Therapy:  INR 2-3   Plan:  INR remains subtherapeutic. Will order warfarin 5 mg x 1. Will recheck INR with AM labs.   Luisa HartScott Kennis Buell, PharmD Clinical Pharmacist  12/15/2015 7:58 AM

## 2015-12-15 NOTE — Evaluation (Signed)
Physical Therapy Evaluation Patient Details Name: Troy BurnRobert S Kesecker MRN: [id number] DOB: [birth year] Today's Date: 12/15/2015   History of Present Illness  Patient admitted with acute COPD exacerbation. Patient has h/o Parkinson's, CVA, COPD, CHF uses 2 liters O2 at home.   Clinical Impression  Pt was readmitted after recent admission, now with reduction in O2 sats during RW gait on the hallway on supplemental O2.  He is not aware of where to set his limits, and PT directed him to shorten gait session when pt was interested in pushing on.  Will follow acutely to educate and condition him for longer gait trips, as well as to assess his ability to handle steps for home.      Follow Up Recommendations Home health PT;Supervision for mobility/OOB    Equipment Recommendations  None recommended by PT    Recommendations for Other Services Rehab consult     Precautions / Restrictions Precautions Precautions: Fall Restrictions Weight Bearing Restrictions: No      Mobility  Bed Mobility Overal bed mobility: Needs Assistance                Transfers Overall transfer level: Needs assistance Equipment used: Rolling walker (2 wheeled) Transfers: Sit to/from Stand;Stand Pivot Transfers Sit to Stand: Min guard;Min assist Stand pivot transfers: Min guard;Min assist       General transfer comment: cues for hand placement  Ambulation/Gait Ambulation/Gait assistance: Min guard;Min assist Ambulation Distance (Feet): 125 Feet Assistive device: Rolling walker (2 wheeled) Gait Pattern/deviations: Step-to pattern;Step-through pattern;Wide base of support;Trunk flexed (wide turning radius) Gait velocity: reduced, faster Gait velocity interpretation: Below normal speed for age/gender (and faster than is safe at times) General Gait Details: cued pace and closeness of walker, a bit impulsive  Stairs            Wheelchair Mobility    Modified Rankin (Stroke Patients Only)        Balance Overall balance assessment: Needs assistance Sitting-balance support: Feet supported Sitting balance-Leahy Scale: Good   Postural control: Posterior lean Standing balance support: Bilateral upper extremity supported Standing balance-Leahy Scale: Fair Standing balance comment: less than fair balance with dynamic balance                             Pertinent Vitals/Pain Pain Assessment: No/denies pain    Home Living Family/patient expects to be discharged to:: Private residence Living Arrangements: Spouse/significant other Available Help at Discharge: Family;Available 24 hours/day;Personal care attendant (attendant is available 8 hours a day 5x per week) Type of Home: House Home Access: Stairs to enter Entrance Stairs-Rails: Can reach both Entrance Stairs-Number of Steps: 3 Home Layout: One level Home Equipment: Walker - 2 wheels;Cane - single point;Wheelchair - manual;Hospital bed;Toilet riser      Prior Function Level of Independence: Independent with assistive device(s)         Comments: Pt ambulates in the home with caregiver nearly every day.  He is able to go to church weekly and occasionally go out to eat (uses walker out of the house)     Hand Dominance        Extremity/Trunk Assessment   Upper Extremity Assessment: Overall WFL for tasks assessed           Lower Extremity Assessment: Overall WFL for tasks assessed      Cervical / Trunk Assessment: Normal  Communication   Communication: HOH  Cognition Arousal/Alertness: Awake/alert Behavior During Therapy: WFL for tasks assessed/performed  Overall Cognitive Status: Within Functional Limits for tasks assessed                      General Comments General comments (skin integrity, edema, etc.): Pt is demonstrating some willingness to push himself, but not able to determine his limits.  Pre-gait O2 sats on 2L O2 were 97% and post gait 85%, recovering to 97%.    Exercises         Assessment/Plan    PT Assessment Patient needs continued PT services  PT Diagnosis Difficulty walking   PT Problem List Decreased activity tolerance;Decreased balance;Decreased range of motion;Decreased mobility;Decreased coordination;Decreased knowledge of use of DME;Decreased safety awareness;Cardiopulmonary status limiting activity;Obesity  PT Treatment Interventions DME instruction;Gait training;Stair training;Functional mobility training;Therapeutic activities;Therapeutic exercise;Balance training;Neuromuscular re-education;Patient/family education   PT Goals (Current goals can be found in the Care Plan section) Acute Rehab PT Goals Patient Stated Goal: get home PT Goal Formulation: With patient Time For Goal Achievement: 12/29/15 Potential to Achieve Goals: Good    Frequency Min 2X/week   Barriers to discharge Inaccessible home environment has stairs to enter house    Co-evaluation               End of Session Equipment Utilized During Treatment: Gait belt;Oxygen Activity Tolerance: Patient tolerated treatment well Patient left: with chair alarm set;in chair;with call bell/phone within reach Nurse Communication: Mobility status         Time: 1610-9604 PT Time Calculation (min) (ACUTE ONLY): 25 min   Charges:   PT Evaluation $PT Eval Moderate Complexity: 1 Procedure PT Treatments $Gait Training: 8-22 mins   PT G Codes:        Ivar Drape January 06, 2016, 11:38 AM    Samul Dada, PT MS Acute Rehab Dept. Number: [facility] [id number] and [facility] [phone]

## 2015-12-16 LAB — GLUCOSE, CAPILLARY
GLUCOSE-CAPILLARY: 380 mg/dL — AB (ref 65–99)
Glucose-Capillary: 296 mg/dL — ABNORMAL HIGH (ref 65–99)

## 2015-12-16 LAB — PROTIME-INR
INR: 2.14
Prothrombin Time: 24.3 seconds — ABNORMAL HIGH (ref 11.4–15.2)

## 2015-12-16 MED ORDER — PREDNISONE 5 MG PO TABS: 5.0000 mg | ORAL_TABLET | Freq: Every day | ORAL | 0 refills | Status: DC

## 2015-12-16 MED ORDER — PREDNISONE 10 MG (21) PO TBPK: ORAL_TABLET | ORAL | 0 refills | Status: DC

## 2015-12-16 MED ORDER — INSULIN GLARGINE 100 UNIT/ML ~~LOC~~ SOLN
5.0000 [IU] | Freq: Once | SUBCUTANEOUS | Status: AC
  Administered 2015-12-16: 5 [IU] via SUBCUTANEOUS
  Filled 2015-12-16: qty 0.05

## 2015-12-16 MED ORDER — WARFARIN SODIUM 3 MG PO TABS: 3.0000 mg | ORAL_TABLET | ORAL | Status: DC

## 2015-12-16 MED ORDER — WARFARIN SODIUM 5 MG PO TABS: 5.0000 mg | ORAL_TABLET | ORAL | Status: DC

## 2015-12-16 NOTE — Progress Notes (Signed)
MD making rounds. Received order to discharge home with Home Health. IV removed. Provided with Education Handout. Provided with prescription. Discharge paperwork provided, explained, signed and witnessed. No unanswered questions. Discharged via wheelchair by nursing staff. Belongings sent with patient and family.

## 2015-12-16 NOTE — Discharge Summary (Signed)
[facility] [place] at [facility]   PATIENT NAME: Troy [name]    MR#:  [id number]  DATE OF BIRTH:  06-26-1923  DATE OF ADMISSION:  12/13/2015 ADMITTING PHYSICIAN: Delfino LovettVipul Shah, MD  DATE OF DISCHARGE: 12/16/2015  PRIMARY CARE PHYSICIAN: Barbette ReichmannHANDE,VISHWANATH, MD    ADMISSION DIAGNOSIS:  Weakness [R53.1] COPD exacerbation (HCC) [J44.1]  DISCHARGE DIAGNOSIS:  Active Problems:   COPD exacerbation (HCC)   SECONDARY DIAGNOSIS:   Past Medical History:  Diagnosis Date  . Chronic atrial fibrillation (HCC)    a. on Couamdin; b. CHADS2VASc at least 7 (CHF, HTN, age x 2, DM, stroke x 2); c. s/p MAZE 1999  . Chronic systolic CHF (congestive heart failure) (HCC)    a. echo 08/01/2015: EF of 45%, mild LVH, mitral valve with annular calcification, mitral valve partially mobile  . CKD (chronic kidney disease), stage III   . COPD (chronic obstructive pulmonary disease) (HCC)   . Diabetes mellitus without complication (HCC)   . Hypertension   . Hypothyroidism   . Legally blind   . Mitral valve prolapse    a. s/p mitral valve repair 1999  . Normal coronary arteries    a. by cardiac cath 1999  . Parkinson's disease (HCC)   . Stroke (HCC)   . TIA (transient ischemic attack)   . UTI (lower urinary tract infection)     HOSPITAL COURSE:   * COPD exacerbation   IV steroid and nebulizer therapy.   advised to follow instructions properly after discharge this time.   No need for antibiotic this time.  * Hyperkalemia   Kayexalate 1 dose for now and monitor.   Stop oral supplement he was taking.  * Hyperglycemia with history of diabetes.   We will add basal insulin and keep on sliding scale coverage.   Change steroid to oral.  * Chronic A fib   Metoprolol and Coumadin   Pharmacy to manage the dose.  * Ch systolic CHF   No acute symptoms   Lasix oral   DISCHARGE CONDITIONS:   stable  CONSULTS OBTAINED:    DRUG ALLERGIES:  No Known  Allergies  DISCHARGE MEDICATIONS:   Current Discharge Medication List    START taking these medications   Details  predniSONE (STERAPRED UNI-PAK 21 TAB) 10 MG (21) TBPK tablet Take 6 tabs first day, 5 tab on day 2, then 4 on day 3rd, 3 tabs on day 4th , 2 tab on day 5th, and 1 tab on 6th day. Qty: 21 tablet, Refills: 0      CONTINUE these medications which have CHANGED   Details  predniSONE (DELTASONE) 5 MG tablet Take 1 tablet (5 mg total) by mouth daily with breakfast. Qty: 30 tablet, Refills: 0      CONTINUE these medications which have NOT CHANGED   Details  albuterol (PROVENTIL HFA) 108 (90 Base) MCG/ACT inhaler Inhale 2 puffs into the lungs every 4 (four) hours as needed for wheezing or shortness of breath. Qty: 1 Inhaler, Refills: 0    albuterol (PROVENTIL) (2.5 MG/3ML) 0.083% nebulizer solution Take 2.5 mg by nebulization 4 (four) times daily.     carbidopa-levodopa (SINEMET IR) 25-250 MG tablet Take 1 tablet by mouth 3 (three) times daily.    cetirizine (ZYRTEC) 10 MG tablet Take 10 mg by mouth daily.    furosemide (LASIX) 40 MG tablet Take 20-40 mg by mouth 2 (two) times daily. 40 mg every morning and 20 mg every evening    insulin  regular human CONCENTRATED (HUMULIN R) 500 UNIT/ML injection Inject 8-12 Units into the skin 2 (two) times daily with a meal. 12 units every morning and 8 units every evening    lisinopril (PRINIVIL,ZESTRIL) 40 MG tablet Take 40 mg by mouth at bedtime.     metoprolol succinate (TOPROL-XL) 25 MG 24 hr tablet Take 1 tablet (25 mg total) by mouth every evening. Take with or immediately following a meal. Qty: 30 tablet, Refills: 0    potassium chloride (MICRO-K) 10 MEQ CR capsule Take 10 mEq by mouth daily.    saxagliptin HCl (ONGLYZA) 5 MG TABS tablet Take 5 mg by mouth daily.    !! warfarin (COUMADIN) 3 MG tablet Take 3 mg by mouth every evening. Pt takes on Tuesday, Thursday, Saturday and Sunday    !! warfarin (COUMADIN) 5 MG tablet  Take 5 mg by mouth every evening. Takes Monday, Wednesday, and Friday    fluticasone-salmeterol (ADVAIR HFA) 115-21 MCG/ACT inhaler Inhale 2 puffs into the lungs 2 (two) times daily. Qty: 1 Inhaler, Refills: 0    glipiZIDE (GLUCOTROL XL) 5 MG 24 hr tablet Take 1 tablet (5 mg total) by mouth daily with breakfast. Qty: 30 tablet, Refills: 0    insulin glargine (LANTUS) 100 UNIT/ML injection Inject 0.24 mLs (24 Units total) into the skin daily. Qty: 10 mL, Refills: 0    polyethylene glycol (MIRALAX / GLYCOLAX) packet Take 17 g by mouth daily. Qty: 30 each, Refills: 0     !! - Potential duplicate medications found. Please discuss with provider.    STOP taking these medications     azithromycin (ZITHROMAX) 250 MG tablet          DISCHARGE INSTRUCTIONS:    Follow with PMD in 1 week.  If you experience worsening of your admission symptoms, develop shortness of breath, life threatening emergency, suicidal or homicidal thoughts you must seek medical attention immediately by calling 911 or calling your MD immediately  if symptoms less severe.  You Must read complete instructions/literature along with all the possible adverse reactions/side effects for all the Medicines you take and that have been prescribed to you. Take any new Medicines after you have completely understood and accept all the possible adverse reactions/side effects.   Please note  You were cared for by a hospitalist during your hospital stay. If you have any questions about your discharge medications or the care you received while you were in the hospital after you are discharged, you can call the unit and asked to speak with the hospitalist on call if the hospitalist that took care of you is not available. Once you are discharged, your primary care physician will handle any further medical issues. Please note that NO REFILLS for any discharge medications will be authorized once you are discharged, as it is imperative that  you return to your primary care physician (or establish a relationship with a primary care physician if you do not have one) for your aftercare needs so that they can reassess your need for medications and monitor your lab values.    Today   CHIEF COMPLAINT:   Chief Complaint  Patient presents with  . Weakness  . COPD    HISTORY OF PRESENT ILLNESS:  Troy [name]  is a 82 y.o. male with a known history of afib, CHF, home O2 dependent COPD, CKD who was recently discharged from the hospital here two days ago for COPD exacerbation, discharged on Prednisone, zithromax and nebs. He now complains of  shortness of breath, dyspnea on exertion, cough, wheezing and weakness. Wife states sats were 70% on 3L (usually uses 2L at home)   Otherwise there has been no change in status. Patient has been taking medication as prescribed and there has been no recent change in medication or diet.  There has been no recent illness, travel or sick contacts.    Patient denies fevers/chills, dizziness, chest pain, N/V/C/D, abdominal pain, dysuria/frequency, changes in mental status.    VITAL SIGNS:  Blood pressure (!) 153/69, pulse 76, temperature 97.7 F (36.5 C), temperature source Oral, resp. rate 20, height 5\' 9"  (1.753 m), weight 112.8 kg (248 lb 10.9 oz), SpO2 99 %.  I/O:   Intake/Output Summary (Last 24 hours) at 12/16/15 0957 Last data filed at 12/15/15 1856  Gross per 24 hour  Intake              480 ml  Output              300 ml  Net              180 ml    PHYSICAL EXAMINATION:   GENERAL:  82 y.o.-year-old patient lying in the bed with no acute distress.  EYES: Pupils equal, round, reactive to light and accommodation. No scleral icterus. Extraocular muscles intact.  HEENT: Head atraumatic, normocephalic. Oropharynx and nasopharynx clear.  NECK:  Supple, no jugular venous distention. No thyroid enlargement, no tenderness.  LUNGS: Normal breath sounds bilaterally, mild wheezing, no  rales,rhonchi or crepitation. No use of accessory muscles of respiration.  CARDIOVASCULAR: S1, S2 normal. No murmurs, rubs, or gallops.  ABDOMEN: Soft, nontender, nondistended. Bowel sounds present. No organomegaly or mass.  EXTREMITIES: No pedal edema, cyanosis, or clubbing.  NEUROLOGIC: Cranial nerves II through XII are intact. Muscle strength 4/5 in all extremities. Sensation intact. Gait not checked.  PSYCHIATRIC: The patient is alert and oriented x 3.  SKIN: No obvious rash, lesion, or ulcer.   DATA REVIEW:   CBC  Recent Labs Lab 12/14/15 0656  WBC 13.4*  HGB 12.8*  HCT 38.2*  PLT 126*    Chemistries   Recent Labs Lab 12/13/15 1512  12/15/15 0423  NA 138  < > 135  K 4.1  < > 5.0  CL 99*  < > 98*  CO2 33*  < > 31  GLUCOSE 49*  < > 381*  BUN 34*  < > 45*  CREATININE 1.22  < > 1.29*  CALCIUM 8.3*  < > 8.4*  AST 27  --   --   ALT 20  --   --   ALKPHOS 57  --   --   BILITOT 1.1  --   --   < > = values in this interval not displayed.  Cardiac Enzymes  Recent Labs Lab 12/14/15 0656  TROPONINI 0.03*    Microbiology Results  Results for orders placed or performed during the hospital encounter of 12/07/15  Culture, blood (routine x 2)     Status: None   Collection Time: 12/07/15  3:22 AM  Result Value Ref Range Status   Specimen Description BLOOD RIGHT ARM  Final   Special Requests BOTTLES DRAWN AEROBIC AND ANAEROBIC  Final   Culture NO GROWTH 5 DAYS  Final   Report Status 12/12/2015 FINAL  Final  Culture, blood (routine x 2)     Status: None   Collection Time: 12/07/15  3:22 AM  Result Value Ref Range Status   Specimen  Description BLOOD LEFT ARM  Final   Special Requests BOTTLES DRAWN AEROBIC AND ANAEROBIC  Final   Culture NO GROWTH 5 DAYS  Final   Report Status 12/12/2015 FINAL  Final    RADIOLOGY:  No results found.  EKG:   Orders placed or performed during the hospital encounter of 12/13/15  . EKG 12-Lead  . EKG 12-Lead  . ED EKG  .  ED EKG      Management plans discussed with the patient, family and they are in agreement.  CODE STATUS:     Code Status Orders        Start     Ordered   12/14/15 1210  Do not attempt resuscitation (DNR)  Continuous    Question Answer Comment  In the event of cardiac or respiratory ARREST Do not call a "code blue"   In the event of cardiac or respiratory ARREST Do not perform Intubation, CPR, defibrillation or ACLS   In the event of cardiac or respiratory ARREST Use medication by any route, position, wound care, and other measures to relive pain and suffering. May use oxygen, suction and manual treatment of airway obstruction as needed for comfort.      12/14/15 1209    Code Status History    Date Active Date Inactive Code Status Order ID Comments User Context   12/13/2015  8:55 PM 12/14/2015 12:09 PM Full Code [id number]  Tonye Royalty, DO ED   12/13/2015  8:50 PM 12/13/2015  8:55 PM DNR [id number]  Tonye Royalty, DO ED   12/07/2015  6:27 AM 12/11/2015  5:06 PM DNR [id number]  Ihor Austin, MD ED   09/25/2015  1:18 AM 09/28/2015  8:53 PM DNR [id number]  Oralia Manis, MD Inpatient   08/07/2015  7:50 PM 08/09/2015  3:23 PM DNR [id number]  Darrol Jump, PA-C Inpatient   08/03/2015  4:58 PM 08/07/2015  7:50 PM DNR [id number]  Houston Siren, MD ED   02/25/2015  7:56 PM 03/01/2015  7:21 PM Full Code [id number]  Gale Journey, MD ED    Advance Directive Documentation   Flowsheet Row Most Recent Value  Type of Advance Directive  Healthcare Power of Attorney  Pre-existing out of facility DNR order (yellow form or pink MOST form)  No data  "MOST" Form in Place?  No data      TOTAL TIME TAKING CARE OF THIS PATIENT: 35 minutes.    Altamese Dilling M.D on 12/16/2015 at 9:57 AM  Between 7am to 6pm - Pager - [phone]  After 6pm go to [email or web address] - password [email or web address]  [facility]  Office  [phone]  CC: Primary care physician; Barbette Reichmann,  MD   Note: This dictation was prepared with Dragon dictation along with smaller phrase technology. Any transcriptional errors that result from this process are unintentional.

## 2015-12-16 NOTE — Progress Notes (Signed)
ANTICOAGULATION CONSULT NOTE - Initial Consult  Pharmacy Consult for Warfarin  Indication: atrial fibrillation  No Known Allergies  Patient Measurements: Height:  (175.3 cm) Weight: 248 lb 10.9 oz (112.8 kg) IBW/kg (Calculated) : 70.7 Heparin Dosing Weight:   Vital Signs: Temp: 97.7 F (36.5 C) (08/13 0400) Temp Source: Oral (08/13 0400) BP: 153/69 (08/13 0400) Pulse Rate: 76 (08/13 0400)  Labs:  Recent Labs  12/13/15 1512 12/13/15 2109 12/14/15 0255 12/14/15 0656 12/15/15 0423 12/16/15 0423  HGB 13.7  --   --  12.8*  --   --   HCT 40.8  --   --  38.2*  --   --   PLT 142*  --   --  126*  --   --   LABPROT 20.7*  --   --  19.3* 19.9* 24.3*  INR 1.75  --   --  1.61 1.67 2.14  CREATININE 1.22  --   --  1.08 1.29*  --   TROPONINI 0.04* 0.03* 0.03* 0.03*  --   --     Estimated Creatinine Clearance: 47.1 mL/min (by C-G formula based on SCr of 1.29 mg/dL).   Medical History: Past Medical History:  Diagnosis Date  . Chronic atrial fibrillation (HCC)    a. on Couamdin; b. CHADS2VASc at least 7 (CHF, HTN, age x 2, DM, stroke x 2); c. s/p MAZE 1999  . Chronic systolic CHF (congestive heart failure) (HCC)    a. echo 08/01/2015: EF of 45%, mild LVH, mitral valve with annular calcification, mitral valve partially mobile  . CKD (chronic kidney disease), stage III   . COPD (chronic obstructive pulmonary disease) (HCC)   . Diabetes mellitus without complication (HCC)   . Hypertension   . Hypothyroidism   . Legally blind   . Mitral valve prolapse    a. s/p mitral valve repair 1999  . Normal coronary arteries    a. by cardiac cath 1999  . Parkinson's disease (HCC)   . Stroke (HCC)   . TIA (transient ischemic attack)   . UTI (lower urinary tract infection)     Medications:  Prescriptions Prior to Admission  Medication Sig Dispense Refill Last Dose  . albuterol (PROVENTIL HFA) 108 (90 Base) MCG/ACT inhaler Inhale 2 puffs into the lungs every 4 (four) hours as needed  for wheezing or shortness of breath. (Patient taking differently: Inhale 2 puffs into the lungs 4 (four) times daily. ) 1 Inhaler 0 12/13/2015 at pm  . albuterol (PROVENTIL) (2.5 MG/3ML) 0.083% nebulizer solution Take 2.5 mg by nebulization 4 (four) times daily.    12/13/2015 at pm  . azithromycin (ZITHROMAX) 250 MG tablet One tablet daily until finished (Patient taking differently: Take 250-500 mg by mouth daily. 500 mg on day 1, then 250 mg daily for 4 days, then stop (started on 12/11/15)) 3 each 0 12/13/2015 at am  . carbidopa-levodopa (SINEMET IR) 25-250 MG tablet Take 1 tablet by mouth 3 (three) times daily.   12/13/2015 at pm  . cetirizine (ZYRTEC) 10 MG tablet Take 10 mg by mouth daily.   12/13/2015 at am  . furosemide (LASIX) 40 MG tablet Take 20-40 mg by mouth 2 (two) times daily. 40 mg every morning and 20 mg every evening   12/13/2015 at am  . glipiZIDE (GLUCOTROL XL) 10 MG 24 hr tablet Take 5-10 mg by mouth 2 (two) times daily. 10 mg every morning and 5 mg every evening   12/13/2015 at am  . insulin regular  human CONCENTRATED (HUMULIN R) 500 UNIT/ML injection Inject 8-12 Units into the skin 2 (two) times daily with a meal. 12 units every morning and 8 units every evening   12/13/2015 at am  . lisinopril (PRINIVIL,ZESTRIL) 40 MG tablet Take 40 mg by mouth at bedtime.    12/12/2015 at pm  . metoprolol succinate (TOPROL-XL) 25 MG 24 hr tablet Take 1 tablet (25 mg total) by mouth every evening. Take with or immediately following a meal. 30 tablet 0 12/12/2015 at 1830  . potassium chloride (MICRO-K) 10 MEQ CR capsule Take 10 mEq by mouth daily.   12/13/2015 at am  . predniSONE (DELTASONE) 5 MG tablet Take 5 mg by mouth daily with breakfast.   12/13/2015 at am  . saxagliptin HCl (ONGLYZA) 5 MG TABS tablet Take 5 mg by mouth daily.   12/13/2015 at am  . warfarin (COUMADIN) 3 MG tablet Take 3 mg by mouth every evening. Pt takes on Tuesday, Thursday, Saturday and Sunday   12/11/2015 at 1830  . warfarin (COUMADIN) 5  MG tablet Take 5 mg by mouth every evening. Takes Monday, Wednesday, and Friday   12/12/2015 at 1830  . fluticasone-salmeterol (ADVAIR HFA) 115-21 MCG/ACT inhaler Inhale 2 puffs into the lungs 2 (two) times daily. (Patient not taking: Reported on 12/13/2015) 1 Inhaler 0 Not Taking at Unknown time  . glipiZIDE (GLUCOTROL XL) 5 MG 24 hr tablet Take 1 tablet (5 mg total) by mouth daily with breakfast. (Patient not taking: Reported on 12/13/2015) 30 tablet 0 Not Taking at Unknown time  . insulin glargine (LANTUS) 100 UNIT/ML injection Inject 0.24 mLs (24 Units total) into the skin daily. (Patient not taking: Reported on 12/13/2015) 10 mL 0 Not Taking at Unknown time  . polyethylene glycol (MIRALAX / GLYCOLAX) packet Take 17 g by mouth daily. (Patient not taking: Reported on 12/13/2015) 30 each 0 Not Taking at Unknown time  . predniSONE (DELTASONE) 5 MG tablet 6 tablets po day1; 5 tablets po day 2; 4 tablets po day3; 3 tablets po day 4; 2 tablets day5; 1 tablets daily afterwards (Patient not taking: Reported on 12/13/2015) 40 tablet 0 Not Taking at Unknown time    Assessment: Pt was recently hospitalized for COPD exacerbation , was therapeutic then on current dose of warfarin.   Patients home dose:  Warfarin 3 mg PO Tues-Thurs-Sat-Sun.  Warfarin 5 mg PO MWF.   8/10:  INR = 1.75, 3 mg 8/11 INR= 1.61, 5 mg 8/12 INR= 1.67, 5 mg 8/13 INR= 2.14  Goal of Therapy:  INR 2-3   Plan:  INR now therapeutic. Will resume home regimen and f/u AM INR.   Luisa HartScott Fredie Majano, PharmD Clinical Pharmacist  12/16/2015 8:42 AM

## 2015-12-16 NOTE — Care Management Note (Addendum)
Case Management Note  Patient Details  Name: Troy BurnRobert [name] MRN: [id number] Date of Birth: [birth year]  Subjective/Objective:     Troy [name] is already open to [facility] and is receiving RN and PT services. A referral to resume home health and to add Aide services was called and faxed to [facility]. Discharge to home today.                Action/Plan:   Expected Discharge Date:                  Expected Discharge Plan:     In-House Referral:     Discharge planning Services     Post Acute Care Choice:    Choice offered to:     DME Arranged:    DME Agency:     HH Arranged:    HH Agency:     Status of Service:     If discussed at [employer] Length of Stay Meetings, dates discussed:    Additional Comments:  Kareem Cathey A, RN 12/16/2015, 10:26 AM

## 2015-12-17 NOTE — Progress Notes (Signed)
   12/15/15 1100  PT Visit Information  Last PT Received On 12/15/15  Assistance Needed +1  History of Present Illness Patient admitted with acute COPD exacerbation. Patient has h/o Parkinson's, CVA, COPD, CHF uses 2 liters O2 at home.   Subjective Data  Subjective I feel better   Patient Stated Goal get home  Precautions  Precautions Fall  Restrictions  Weight Bearing Restrictions No  Pain Assessment  Pain Assessment No/denies pain  Cognition  Arousal/Alertness Awake/alert  Behavior During Therapy WFL for tasks assessed/performed  Overall Cognitive Status Within Functional Limits for tasks assessed  Bed Mobility  Overal bed mobility Needs Assistance  Transfers  Overall transfer level Needs assistance  Equipment used Rolling walker (2 wheeled)  Transfers Sit to/from Stand;Stand Pivot Transfers  Sit to Stand Min guard;Min assist  Stand pivot transfers Min guard;Min assist  General transfer comment cues for hand placement  Ambulation/Gait  Ambulation/Gait assistance Min guard;Min assist  Ambulation Distance (Feet) 125 Feet  Assistive device Rolling walker (2 wheeled)  Gait Pattern/deviations Step-to pattern;Step-through pattern;Wide base of support;Trunk flexed (wide turning radius)  General Gait Details cued pace and closeness of walker, a bit impulsive  Gait velocity reduced, faster  Gait velocity interpretation Below normal speed for age/gender (and faster than is safe at times)  Balance  Overall balance assessment Needs assistance  Sitting-balance support Feet supported  Sitting balance-Leahy Scale Good  Postural control Posterior lean  Standing balance support Bilateral upper extremity supported  Standing balance-Leahy Scale Fair  Standing balance comment less than fair balance with dynamic balance  General Comments  General comments (skin integrity, edema, etc.) Pt is demonstrating some willingness to push himself, but not able to determine his limits.  Pre-gait O2  sats on 2L O2 were 97% and post gait 85%, recovering to 97%.  Exercises  Exercises Other exercises (LE strength grossly WFL BLE's)  PT - End of Session  Equipment Utilized During Treatment Gait belt;Oxygen  Activity Tolerance Patient tolerated treatment well  Patient left with chair alarm set;in chair;with call bell/phone within reach  Nurse Communication Mobility status  PT - Assessment/Plan  PT Frequency (ACUTE ONLY) Min 2X/week  Recommendations for Other Services Rehab consult  Follow Up Recommendations Home health PT;Supervision for mobility/OOB  PT equipment None recommended by PT  Acute Rehab PT Goals  PT Goal Formulation With patient  Time For Goal Achievement 12/29/15  Potential to Achieve Goals Good  PT Time Calculation  PT Start Time (ACUTE ONLY) 0853  PT Stop Time (ACUTE ONLY) 0918  PT Time Calculation (min) (ACUTE ONLY) 25 min  PT G-Codes **NOT FOR INPATIENT CLASS**  Functional Assessment Tool Used clinical judgment  Functional Limitation Mobility: Walking and moving around  Mobility: Walking and Moving Around Current Status (H4742(G8978) CJ  Mobility: Walking and Moving Around Goal Status (V9563(G8979) CI  PT General Charges  $$ ACUTE PT VISIT 1 Procedure  PT Evaluation  $PT Eval Moderate Complexity 1 Procedure  PT Treatments  $Gait Training 8-22 mins  Samul Dadauth Niki Payment, PT MS Acute Rehab Dept. Number: [facility] [phone] and [facility] [phone]

## 2016-01-08 ENCOUNTER — Ambulatory Visit: Payer: Medicare Other | Admitting: Family

## 2016-02-12 ENCOUNTER — Ambulatory Visit (INDEPENDENT_AMBULATORY_CARE_PROVIDER_SITE_OTHER): Payer: Medicare Other | Admitting: *Deleted

## 2016-02-12 ENCOUNTER — Telehealth: Payer: Self-pay | Admitting: Cardiology

## 2016-02-12 DIAGNOSIS — I495 Sick sinus syndrome: Secondary | ICD-10-CM

## 2016-02-12 NOTE — Progress Notes (Signed)
Remote pacemaker transmission.   

## 2016-02-12 NOTE — Telephone Encounter (Signed)
LMOVM reminding pt to send remote transmission.   

## 2016-02-13 ENCOUNTER — Encounter: Payer: Self-pay | Admitting: Cardiology

## 2016-03-11 LAB — CUP PACEART REMOTE DEVICE CHECK
Battery Remaining Longevity: 138 mo
Date Time Interrogation Session: 20171107161542
Implantable Lead Location: 753860
Implantable Lead Model: 5076
Implantable Pulse Generator Implant Date: 20170405
Lead Channel Pacing Threshold Amplitude: 0.625 V
Lead Channel Pacing Threshold Pulse Width: 0.4 ms
Lead Channel Sensing Intrinsic Amplitude: 4.3 mV
MDC IDC LEAD IMPLANT DT: 20170405
MDC IDC STAT BRADY RV PERCENT PACED: 17.6 %

## 2016-03-19 ENCOUNTER — Inpatient Hospital Stay
Admission: EM | Admit: 2016-03-19 | Discharge: 2016-03-21 | DRG: 683 | Disposition: A | Discharge status: Home Health | Payer: Medicare Other | Attending: Internal Medicine | Admitting: Internal Medicine

## 2016-03-19 ENCOUNTER — Emergency Department: Payer: Medicare Other

## 2016-03-19 ENCOUNTER — Encounter: Payer: Self-pay | Admitting: Emergency Medicine

## 2016-03-19 DIAGNOSIS — Y92009 Unspecified place in unspecified non-institutional (private) residence as the place of occurrence of the external cause: Secondary | ICD-10-CM

## 2016-03-19 DIAGNOSIS — W01190A Fall on same level from slipping, tripping and stumbling with subsequent striking against furniture, initial encounter: Secondary | ICD-10-CM | POA: Diagnosis not present

## 2016-03-19 DIAGNOSIS — I959 Hypotension, unspecified: Secondary | ICD-10-CM | POA: Diagnosis not present

## 2016-03-19 DIAGNOSIS — Z823 Family history of stroke: Secondary | ICD-10-CM

## 2016-03-19 DIAGNOSIS — Z66 Do not resuscitate: Secondary | ICD-10-CM | POA: Diagnosis not present

## 2016-03-19 DIAGNOSIS — R262 Difficulty in walking, not elsewhere classified: Secondary | ICD-10-CM

## 2016-03-19 DIAGNOSIS — J449 Chronic obstructive pulmonary disease, unspecified: Secondary | ICD-10-CM | POA: Diagnosis not present

## 2016-03-19 DIAGNOSIS — D689 Coagulation defect, unspecified: Secondary | ICD-10-CM | POA: Diagnosis not present

## 2016-03-19 DIAGNOSIS — Z950 Presence of cardiac pacemaker: Secondary | ICD-10-CM

## 2016-03-19 DIAGNOSIS — Z794 Long term (current) use of insulin: Secondary | ICD-10-CM

## 2016-03-19 DIAGNOSIS — I482 Chronic atrial fibrillation: Secondary | ICD-10-CM | POA: Diagnosis present

## 2016-03-19 DIAGNOSIS — I130 Hypertensive heart and chronic kidney disease with heart failure and stage 1 through stage 4 chronic kidney disease, or unspecified chronic kidney disease: Secondary | ICD-10-CM | POA: Diagnosis present

## 2016-03-19 DIAGNOSIS — S20219A Contusion of unspecified front wall of thorax, initial encounter: Secondary | ICD-10-CM

## 2016-03-19 DIAGNOSIS — N179 Acute kidney failure, unspecified: Principal | ICD-10-CM | POA: Diagnosis present

## 2016-03-19 DIAGNOSIS — S20212A Contusion of left front wall of thorax, initial encounter: Secondary | ICD-10-CM

## 2016-03-19 DIAGNOSIS — R0902 Hypoxemia: Secondary | ICD-10-CM

## 2016-03-19 DIAGNOSIS — Z87891 Personal history of nicotine dependence: Secondary | ICD-10-CM

## 2016-03-19 DIAGNOSIS — E860 Dehydration: Secondary | ICD-10-CM | POA: Diagnosis not present

## 2016-03-19 DIAGNOSIS — G20 Parkinson's disease: Secondary | ICD-10-CM | POA: Diagnosis not present

## 2016-03-19 DIAGNOSIS — M6281 Muscle weakness (generalized): Secondary | ICD-10-CM

## 2016-03-19 DIAGNOSIS — W19XXXA Unspecified fall, initial encounter: Secondary | ICD-10-CM

## 2016-03-19 DIAGNOSIS — Z79899 Other long term (current) drug therapy: Secondary | ICD-10-CM

## 2016-03-19 DIAGNOSIS — D72829 Elevated white blood cell count, unspecified: Secondary | ICD-10-CM

## 2016-03-19 DIAGNOSIS — H548 Legal blindness, as defined in USA: Secondary | ICD-10-CM | POA: Diagnosis present

## 2016-03-19 DIAGNOSIS — N183 Chronic kidney disease, stage 3 (moderate): Secondary | ICD-10-CM | POA: Diagnosis present

## 2016-03-19 DIAGNOSIS — Z8249 Family history of ischemic heart disease and other diseases of the circulatory system: Secondary | ICD-10-CM | POA: Diagnosis not present

## 2016-03-19 DIAGNOSIS — I5022 Chronic systolic (congestive) heart failure: Secondary | ICD-10-CM | POA: Diagnosis not present

## 2016-03-19 DIAGNOSIS — Z8673 Personal history of transient ischemic attack (TIA), and cerebral infarction without residual deficits: Secondary | ICD-10-CM

## 2016-03-19 DIAGNOSIS — Z7901 Long term (current) use of anticoagulants: Secondary | ICD-10-CM

## 2016-03-19 DIAGNOSIS — N189 Chronic kidney disease, unspecified: Secondary | ICD-10-CM

## 2016-03-19 DIAGNOSIS — Z7951 Long term (current) use of inhaled steroids: Secondary | ICD-10-CM

## 2016-03-19 DIAGNOSIS — E1122 Type 2 diabetes mellitus with diabetic chronic kidney disease: Secondary | ICD-10-CM | POA: Diagnosis not present

## 2016-03-19 DIAGNOSIS — R531 Weakness: Secondary | ICD-10-CM

## 2016-03-19 DIAGNOSIS — Z7952 Long term (current) use of systemic steroids: Secondary | ICD-10-CM

## 2016-03-19 DIAGNOSIS — R791 Abnormal coagulation profile: Secondary | ICD-10-CM

## 2016-03-19 LAB — BASIC METABOLIC PANEL
Anion gap: 6 (ref 5–15)
BUN: 61 mg/dL — AB (ref 6–20)
CALCIUM: 8.7 mg/dL — AB (ref 8.9–10.3)
CO2: 26 mmol/L (ref 22–32)
CREATININE: 2.17 mg/dL — AB (ref 0.61–1.24)
Chloride: 106 mmol/L (ref 101–111)
GFR calc Af Amer: 29 mL/min — ABNORMAL LOW (ref >60)
GFR calc non Af Amer: 25 mL/min — ABNORMAL LOW (ref >60)
GLUCOSE: 177 mg/dL — AB (ref 65–99)
Potassium: 5.1 mmol/L (ref 3.5–5.1)
Sodium: 138 mmol/L (ref 135–145)

## 2016-03-19 LAB — CBC WITH DIFFERENTIAL/PLATELET
Basophils Absolute: 0.1 10*3/uL (ref 0–0.1)
Basophils Relative: 1 %
EOS ABS: 0.2 10*3/uL (ref 0–0.7)
EOS PCT: 2 %
HCT: 41.8 % (ref 40.0–52.0)
Hemoglobin: 13.9 g/dL (ref 13.0–18.0)
LYMPHS ABS: 1.3 10*3/uL (ref 1.0–3.6)
Lymphocytes Relative: 11 %
MCH: 30 pg (ref 26.0–34.0)
MCHC: 33.1 g/dL (ref 32.0–36.0)
MCV: 90.7 fL (ref 80.0–100.0)
MONOS PCT: 7 %
Monocytes Absolute: 0.9 10*3/uL (ref 0.2–1.0)
Neutro Abs: 9.4 10*3/uL — ABNORMAL HIGH (ref 1.4–6.5)
Neutrophils Relative %: 79 %
PLATELETS: 165 10*3/uL (ref 150–440)
RBC: 4.62 MIL/uL (ref 4.40–5.90)
RDW: 15.5 % — ABNORMAL HIGH (ref 11.5–14.5)
WBC: 11.8 10*3/uL — AB (ref 3.8–10.6)

## 2016-03-19 LAB — MAGNESIUM: Magnesium: 2 mg/dL (ref 1.7–2.4)

## 2016-03-19 LAB — PROTIME-INR
INR: 3.36
PROTHROMBIN TIME: 34.8 s — AB (ref 11.4–15.2)

## 2016-03-19 LAB — GLUCOSE, CAPILLARY
GLUCOSE-CAPILLARY: 151 mg/dL — AB (ref 65–99)
Glucose-Capillary: 168 mg/dL — ABNORMAL HIGH (ref 65–99)

## 2016-03-19 MED ORDER — ALBUTEROL SULFATE (2.5 MG/3ML) 0.083% IN NEBU: 2.5000 mg | INHALATION_SOLUTION | Freq: Four times a day (QID) | RESPIRATORY_TRACT | Status: DC | PRN

## 2016-03-19 MED ORDER — LINAGLIPTIN 5 MG PO TABS: 5.0000 mg | ORAL_TABLET | Freq: Every day | ORAL | Status: DC

## 2016-03-19 MED ORDER — INSULIN ASPART 100 UNIT/ML ~~LOC~~ SOLN
0.0000 [IU] | Freq: Every day | SUBCUTANEOUS | Status: DC
  Administered 2016-03-20: 2 [IU] via SUBCUTANEOUS
  Filled 2016-03-19: qty 2

## 2016-03-19 MED ORDER — INSULIN GLARGINE 100 UNIT/ML ~~LOC~~ SOLN
10.0000 [IU] | Freq: Every day | SUBCUTANEOUS | Status: DC
  Administered 2016-03-19 – 2016-03-20 (×2): 10 [IU] via SUBCUTANEOUS
  Filled 2016-03-19 (×3): qty 0.1

## 2016-03-19 MED ORDER — FENTANYL CITRATE (PF) 100 MCG/2ML IJ SOLN
25.0000 ug | Freq: Once | INTRAMUSCULAR | Status: AC
  Administered 2016-03-19: 25 ug via INTRAVENOUS
  Filled 2016-03-19: qty 2

## 2016-03-19 MED ORDER — METOPROLOL SUCCINATE ER 25 MG PO TB24
25.0000 mg | ORAL_TABLET | Freq: Every evening | ORAL | Status: DC
  Administered 2016-03-19 – 2016-03-20 (×2): 25 mg via ORAL
  Filled 2016-03-19 (×2): qty 1

## 2016-03-19 MED ORDER — SODIUM CHLORIDE 0.9 % IV SOLN
INTRAVENOUS | Status: DC
  Administered 2016-03-19 – 2016-03-20 (×3): via INTRAVENOUS

## 2016-03-19 MED ORDER — CARBIDOPA-LEVODOPA 25-250 MG PO TABS
1.0000 | ORAL_TABLET | Freq: Three times a day (TID) | ORAL | Status: DC
  Administered 2016-03-19 – 2016-03-21 (×6): 1 via ORAL
  Filled 2016-03-19 (×7): qty 1

## 2016-03-19 MED ORDER — INSULIN ASPART 100 UNIT/ML ~~LOC~~ SOLN
0.0000 [IU] | Freq: Three times a day (TID) | SUBCUTANEOUS | Status: DC
  Administered 2016-03-19 – 2016-03-20 (×3): 2 [IU] via SUBCUTANEOUS
  Administered 2016-03-20: 1 [IU] via SUBCUTANEOUS
  Administered 2016-03-21: 7 [IU] via SUBCUTANEOUS
  Filled 2016-03-19: qty 7
  Filled 2016-03-19: qty 2
  Filled 2016-03-19: qty 1
  Filled 2016-03-19 (×2): qty 2

## 2016-03-19 MED ORDER — SODIUM CHLORIDE 0.9 % IV BOLUS (SEPSIS): 500.0000 mL | Freq: Once | INTRAVENOUS | Status: DC

## 2016-03-19 MED ORDER — SODIUM CHLORIDE 0.9 % IV BOLUS (SEPSIS): 250.0000 mL | Freq: Once | INTRAVENOUS | Status: DC

## 2016-03-19 MED ORDER — ALBUTEROL SULFATE HFA 108 (90 BASE) MCG/ACT IN AERS: 2.0000 | INHALATION_SPRAY | Freq: Three times a day (TID) | RESPIRATORY_TRACT | Status: DC | PRN

## 2016-03-19 MED ORDER — LORATADINE 10 MG PO TABS
10.0000 mg | ORAL_TABLET | Freq: Every day | ORAL | Status: DC
  Administered 2016-03-20 – 2016-03-21 (×2): 10 mg via ORAL
  Filled 2016-03-19 (×2): qty 1

## 2016-03-19 MED ORDER — GLIPIZIDE ER 5 MG PO TB24
5.0000 mg | ORAL_TABLET | Freq: Every day | ORAL | Status: DC
  Administered 2016-03-20: 5 mg via ORAL
  Filled 2016-03-19: qty 1

## 2016-03-19 MED ORDER — INSULIN REGULAR HUMAN (CONC) 500 UNIT/ML ~~LOC~~ SOLN
5.0000 [IU] | Freq: Two times a day (BID) | SUBCUTANEOUS | Status: DC
  Filled 2016-03-19: qty 20

## 2016-03-19 MED ORDER — PREDNISONE 10 MG PO TABS
5.0000 mg | ORAL_TABLET | Freq: Every day | ORAL | Status: DC
  Administered 2016-03-20 – 2016-03-21 (×2): 5 mg via ORAL
  Filled 2016-03-19 (×2): qty 1

## 2016-03-19 MED ORDER — INSULIN ASPART 100 UNIT/ML ~~LOC~~ SOLN
5.0000 [IU] | Freq: Two times a day (BID) | SUBCUTANEOUS | Status: DC
  Administered 2016-03-19 – 2016-03-21 (×4): 5 [IU] via SUBCUTANEOUS
  Filled 2016-03-19 (×4): qty 5

## 2016-03-19 MED ORDER — SODIUM CHLORIDE 0.9 % IV BOLUS (SEPSIS)
500.0000 mL | Freq: Once | INTRAVENOUS | Status: AC
  Administered 2016-03-19: 500 mL via INTRAVENOUS

## 2016-03-19 NOTE — H&P (Signed)
Troy [name] is an 82 y.o. male.   Chief Complaint: Fall and rib pain. HPI: This is a 82 year old male who lives at home with his wife. 2 days ago he fell while turning the wrong way. Says he crushed the trash can with left side of his chest. Since then he has noted bruising on his chest has been very sore. Has been feeling weak since then but denies any decrease in by mouth intake. Here in the ER sound to be hypotensive. He's had no complaints of chest pain or shortness of breath other than the chest wall tenderness.  Past Medical History:  Diagnosis Date  . Chronic atrial fibrillation (HCC)    a. on Couamdin; b. CHADS2VASc at least 7 (CHF, HTN, age x 2, DM, stroke x 2); c. s/p MAZE 1999  . Chronic systolic CHF (congestive heart failure) ([place])    a. echo 08/01/2015: EF of 45%, mild LVH, mitral valve with annular calcification, mitral valve partially mobile  . CKD (chronic kidney disease), stage III   . COPD (chronic obstructive pulmonary disease) ([place])   . Diabetes mellitus without complication ([place])   . Hypertension   . Hypothyroidism   . Legally blind   . Mitral valve prolapse    a. s/p mitral valve repair 1999  . Normal coronary arteries    a. by cardiac cath 1999  . Parkinson's disease ([place])   . Stroke ([place])   . TIA (transient ischemic attack)   . UTI (lower urinary tract infection)     Past Surgical History:  Procedure Laterality Date  . CARDIAC CATHETERIZATION N/A 08/08/2015   Procedure: Temporary Pacemaker;  Surgeon: Peter M Martinique, MD;  Location: [facility];  Service: Cardiovascular;  Laterality: N/A;  . CARDIAC SURGERY    . CHOLECYSTECTOMY    . EP IMPLANTABLE DEVICE N/A 08/08/2015   Procedure: Pacemaker Implant;  Surgeon: Will Meredith Leeds, MD;  Location: [facility];  Service: Cardiovascular;  Laterality: N/A;    Family History  Problem Relation Age of Onset  . Hypertension Mother   . Stroke Mother   . CAD Father   . Lymphoma Sister   . Lung  disease Brother    Social History:  reports that he has quit smoking. His smoking use included Cigarettes. He has a 80.00 pack-year smoking history. He has never used smokeless tobacco. He reports that he does not drink alcohol or use drugs.  Allergies: No Known Allergies   (Not in a hospital admission)  Results for orders placed or performed during the hospital encounter of 03/19/16 (from the past 48 hour(s))  CBC with Differential/Platelet     Status: Abnormal   Collection Time: 03/19/16 12:13 PM  Result Value Ref Range   WBC 11.8 (H) 3.8 - 10.6 K/uL   RBC 4.62 4.40 - 5.90 MIL/uL   Hemoglobin 13.9 13.0 - 18.0 g/dL   HCT 41.8 40.0 - 52.0 %   MCV 90.7 80.0 - 100.0 fL   MCH 30.0 26.0 - 34.0 pg   MCHC 33.1 32.0 - 36.0 g/dL   RDW 15.5 (H) 11.5 - 14.5 %   Platelets 165 150 - 440 K/uL   Neutrophils Relative % 79 %   Neutro Abs 9.4 (H) 1.4 - 6.5 K/uL   Lymphocytes Relative 11 %   Lymphs Abs 1.3 1.0 - 3.6 K/uL   Monocytes Relative 7 %   Monocytes Absolute 0.9 0.2 - 1.0 K/uL   Eosinophils Relative 2 %  Eosinophils Absolute 0.2 0 - 0.7 K/uL   Basophils Relative 1 %   Basophils Absolute 0.1 0 - 0.1 K/uL  Protime-INR     Status: Abnormal   Collection Time: 03/19/16 12:13 PM  Result Value Ref Range   Prothrombin Time 34.8 (H) 11.4 - 15.2 seconds   INR 0.08   Basic metabolic panel     Status: Abnormal   Collection Time: 03/19/16 12:13 PM  Result Value Ref Range   Sodium 138 135 - 145 mmol/L   Potassium 5.1 3.5 - 5.1 mmol/L   Chloride 106 101 - 111 mmol/L   CO2 26 22 - 32 mmol/L   Glucose, Bld 177 (H) 65 - 99 mg/dL   BUN 61 (H) 6 - 20 mg/dL   Creatinine, Ser 2.17 (H) 0.61 - 1.24 mg/dL   Calcium 8.7 (L) 8.9 - 10.3 mg/dL   GFR calc non Af Amer 25 (L) >60 mL/min   GFR calc Af Amer 29 (L) >60 mL/min    Comment: (NOTE) The eGFR has been calculated using the CKD EPI equation. This calculation has not been validated in all clinical situations. eGFR's persistently <60 mL/min signify  possible Chronic Kidney Disease.    Anion gap 6 5 - 15  Magnesium     Status: None   Collection Time: 03/19/16 12:13 PM  Result Value Ref Range   Magnesium 2.0 1.7 - 2.4 mg/dL   Ct Abdomen Pelvis Wo Contrast  Result Date: 03/19/2016 CLINICAL DATA:  Fall with rib and back pain. Left rib bruising. Initial encounter. EXAM: CT CHEST, ABDOMEN AND PELVIS WITHOUT CONTRAST TECHNIQUE: Multidetector CT imaging of the chest, abdomen and pelvis was performed following the standard protocol without IV contrast. COMPARISON:  02/25/2015 FINDINGS: CT CHEST FINDINGS Cardiovascular: Chronic cardiomegaly. No pericardial effusion. Single chamber pacer into the right ventricle. Status post mitral valve replacement. Small volume gas in the right ventricle, usually from IV access. Mediastinum/Nodes: No adenopathy or hematoma. Lungs/Pleura: No hemothorax, pneumothorax, or lung contusion. No consolidation or edema. Musculoskeletal: See below CT ABDOMEN PELVIS FINDINGS Hepatobiliary: No evidence of injury. Pancreas: Cholecystectomy.  Normal common bile duct diameter. Spleen: No evidence of injury. Adrenals/Urinary Tract: No evidence of injury. Bilateral renal cysts as characterized by enhanced CT in 2016. No hydronephrosis. Stomach/Bowel: No evidence of injury. Extensive colonic diverticulosis. Vascular/Lymphatic: Atherosclerotic calcification on the aorta and iliacs. No acute finding. Negative for adenopathy. Reproductive: No acute finding Other: No ascites or pneumoperitoneum Musculoskeletal: Subcutaneous reticulation and nodularity along the lower left chest wall consistent with contusion in this patient with reported ecchymosis in this region. Remote and healed left rib fractures.  No acute fracture noted. Lower lumbar facet arthropathy with prominent hypertrophy and canal stenosis. Unremarkable sacral stimulator on the left. Diffuse idiopathic skeletal hyperostosis with ankylosis throughout the thoracic spine. IMPRESSION:  Left chest wall contusion without acute fracture or intrathoracic injury. No acute finding in the abdomen. Chronic findings are stable from prior and described above. Electronically Signed   By: Monte Fantasia M.D.   On: 03/19/2016 14:31   Ct Head Wo Contrast  Result Date: 03/19/2016 CLINICAL DATA:  [place] 3 days ago upon standing. Trauma to the head and neck. EXAM: CT HEAD WITHOUT CONTRAST CT CERVICAL SPINE WITHOUT CONTRAST TECHNIQUE: Multidetector CT imaging of the head and cervical spine was performed following the standard protocol without intravenous contrast. Multiplanar CT image reconstructions of the cervical spine were also generated. COMPARISON:  08/07/2015 FINDINGS: CT HEAD FINDINGS Brain: The brain shows generalized atrophy. There  chronic small-vessel ischemic changes of the deep white matter. No sign of acute or subacute infarction, mass lesion, hemorrhage, hydrocephalus or extra-axial collection. Vascular: There is atherosclerotic calcification of the major vessels at the base of the brain. Skull: No fracture or focal lesion. Sinuses/Orbits: Few opacified mastoid air cells on the right, not likely significant. Possible nasal polyps as chronically noted. Other: None significant CT CERVICAL SPINE FINDINGS Alignment: Normal alignment. Skull base and vertebrae: No fracture. Chronic degenerative arthritis at C1-2 in chronic degenerative spondylosis and facet arthropathy in the lower cervical region. Soft tissues and spinal canal: Negative Disc levels: Degenerative spondylosis most pronounced from C3-C7. Facet arthropathy most pronounced on the left at C2-3 and C3-4. No severe foraminal stenosis. No likely significant canal stenosis. Upper chest: Negative Other: None IMPRESSION: Head CT: No acute or traumatic finding. Age related atrophy. Chronic nasal polyps or polypoid masses. Cervical spine CT: No acute or traumatic finding. Chronic degenerative changes as discussed above Electronically Signed   By:  Nelson Chimes M.D.   On: 03/19/2016 14:26   Ct Chest Wo Contrast  Result Date: 03/19/2016 CLINICAL DATA:  Fall with rib and back pain. Left rib bruising. Initial encounter. EXAM: CT CHEST, ABDOMEN AND PELVIS WITHOUT CONTRAST TECHNIQUE: Multidetector CT imaging of the chest, abdomen and pelvis was performed following the standard protocol without IV contrast. COMPARISON:  02/25/2015 FINDINGS: CT CHEST FINDINGS Cardiovascular: Chronic cardiomegaly. No pericardial effusion. Single chamber pacer into the right ventricle. Status post mitral valve replacement. Small volume gas in the right ventricle, usually from IV access. Mediastinum/Nodes: No adenopathy or hematoma. Lungs/Pleura: No hemothorax, pneumothorax, or lung contusion. No consolidation or edema. Musculoskeletal: See below CT ABDOMEN PELVIS FINDINGS Hepatobiliary: No evidence of injury. Pancreas: Cholecystectomy.  Normal common bile duct diameter. Spleen: No evidence of injury. Adrenals/Urinary Tract: No evidence of injury. Bilateral renal cysts as characterized by enhanced CT in 2016. No hydronephrosis. Stomach/Bowel: No evidence of injury. Extensive colonic diverticulosis. Vascular/Lymphatic: Atherosclerotic calcification on the aorta and iliacs. No acute finding. Negative for adenopathy. Reproductive: No acute finding Other: No ascites or pneumoperitoneum Musculoskeletal: Subcutaneous reticulation and nodularity along the lower left chest wall consistent with contusion in this patient with reported ecchymosis in this region. Remote and healed left rib fractures.  No acute fracture noted. Lower lumbar facet arthropathy with prominent hypertrophy and canal stenosis. Unremarkable sacral stimulator on the left. Diffuse idiopathic skeletal hyperostosis with ankylosis throughout the thoracic spine. IMPRESSION: Left chest wall contusion without acute fracture or intrathoracic injury. No acute finding in the abdomen. Chronic findings are stable from prior and  described above. Electronically Signed   By: Monte Fantasia M.D.   On: 03/19/2016 14:31   Ct Cervical Spine Wo Contrast  Result Date: 03/19/2016 CLINICAL DATA:  [place] 3 days ago upon standing. Trauma to the head and neck. EXAM: CT HEAD WITHOUT CONTRAST CT CERVICAL SPINE WITHOUT CONTRAST TECHNIQUE: Multidetector CT imaging of the head and cervical spine was performed following the standard protocol without intravenous contrast. Multiplanar CT image reconstructions of the cervical spine were also generated. COMPARISON:  08/07/2015 FINDINGS: CT HEAD FINDINGS Brain: The brain shows generalized atrophy. There chronic small-vessel ischemic changes of the deep white matter. No sign of acute or subacute infarction, mass lesion, hemorrhage, hydrocephalus or extra-axial collection. Vascular: There is atherosclerotic calcification of the major vessels at the base of the brain. Skull: No fracture or focal lesion. Sinuses/Orbits: Few opacified mastoid air cells on the right, not likely significant. Possible nasal polyps as chronically noted.  Other: None significant CT CERVICAL SPINE FINDINGS Alignment: Normal alignment. Skull base and vertebrae: No fracture. Chronic degenerative arthritis at C1-2 in chronic degenerative spondylosis and facet arthropathy in the lower cervical region. Soft tissues and spinal canal: Negative Disc levels: Degenerative spondylosis most pronounced from C3-C7. Facet arthropathy most pronounced on the left at C2-3 and C3-4. No severe foraminal stenosis. No likely significant canal stenosis. Upper chest: Negative Other: None IMPRESSION: Head CT: No acute or traumatic finding. Age related atrophy. Chronic nasal polyps or polypoid masses. Cervical spine CT: No acute or traumatic finding. Chronic degenerative changes as discussed above Electronically Signed   By: Nelson Chimes M.D.   On: 03/19/2016 14:26   Dg Chest Portable 1 View  Result Date: 03/19/2016 CLINICAL DATA:  Rib and back pain after a  fall on Sunday. Initial encounter. EXAM: PORTABLE CHEST 1 VIEW COMPARISON:  The 02/2016. FINDINGS: Trachea is midline. Heart is enlarged. Pacemaker lead tip is in the right ventricle. Lungs are somewhat low in volume with minimal bibasilar subsegmental atelectasis. No airspace consolidation or pleural fluid. No pneumothorax. Old left rib fractures. IMPRESSION: No acute findings. Electronically Signed   By: Lorin Picket M.D.   On: 03/19/2016 12:59    Review of Systems  Constitutional: Negative for chills and fever.  HENT: Negative for hearing loss.   Eyes: Negative for blurred vision.  Respiratory: Negative for shortness of breath.   Cardiovascular: Negative for chest pain.  Gastrointestinal: Negative for nausea and vomiting.  Genitourinary: Negative for dysuria.  Musculoskeletal: Negative for joint pain.  Skin: Negative for rash.  Neurological: Negative for sensory change.    Blood pressure 135/83, pulse 76, temperature 97.4 F (36.3 C), temperature source Oral, resp. rate 16, height 5' 9.5" (1.765 m), weight 110.7 kg (244 lb), SpO2 96 %. Physical Exam  Constitutional: He is oriented to person, place, and time. He appears well-developed and well-nourished. No distress.  HENT:  Head: Normocephalic and atraumatic.  Mouth/Throat: Oropharynx is clear and moist. No oropharyngeal exudate.  Eyes: Pupils are equal, round, and reactive to light. No scleral icterus.  Neck: Neck supple. No JVD present. No tracheal deviation present. No thyromegaly present.  Cardiovascular:  Regular rate and rhythm. 1/6 systolic murmur.  Respiratory: He exhibits tenderness.  Clear to auscultation. Noted also percussion. Not using accessory muscles. Large amount of ecchymosis on the left chest wall and minor amount of right chest wall.  GI: Soft. Bowel sounds are normal. He exhibits no distension and no mass.  Musculoskeletal: He exhibits no edema or tenderness.  Lymphadenopathy:    He has no cervical  adenopathy.  Neurological: He is alert and oriented to person, place, and time.  Skin: Skin is warm and dry.     Assessment/Plan 1. Acute renal failure. His creatinine is elevated. He denies decreased by mouth intake however this looks like a ratio of dehydration. We'll hold his diuretics and since he has a history of congestive heart failure will rehydrate him gently. Follow his renal function in the morning.  2. Hypotension. Likely secondary from volume depletion. He is responding to IV fluid hydration here in the ER. However we'll need to monitor this carefully with his history of CHF. CT scans of the head and chest and abdomen did not show any internal bleeding and his hemoglobin is normal. Suspect volume loss may be from decreased by mouth intake although he denies this. Continue fluids and hold diuretics as above. Hold blood pressure medicines for now.  3. Coagulopathy. His  INR is elevated at 3.3. Since he is status post fall and had some subcutaneous bleeding is chest wall we will hold his Coumadin for now recheck his INR tomorrow.  4. Atrial fibrillation. Currently is in a paced rhythm. We'll try continuing his beta blocker for rate control if his blood pressure will allow. He is anticoagulated with Coumadin R were holding this because it supratherapeutic.  5. Fall. He describes turning the wrong way. There were no presyncopal symptoms. We'll get PT evaluation.  6. CODE STATUS. Patient wishes to be a DO NOT RESUSCITATE.  Time spent 45 minutes  Baxter Hire, MD 03/19/2016, 3:16 PM

## 2016-03-19 NOTE — ED Triage Notes (Signed)
Pt present to ED with rib pain, back pain due to a fall that happened at home Sunday. Pt had stood up and looked at the clock and fell. Pt with bruise on back with bruising on left lateral ribs. Pt takes Warfarin. Alert and oriented at present time

## 2016-03-19 NOTE — ED Provider Notes (Signed)
[facility] Emergency Department Provider Note  ____________________________________________  Time seen: Approximately 12:38 PM  I have reviewed the triage vital signs and the nursing notes.   HISTORY  Chief Complaint Back Pain; Chest Pain (ribs hurt due to fall sunday); and Fall   HPI Troy [name] is a 82 y.o. male history of atrial fibrillation on warfarin, CHF (EF 50-55% on 5/17), CKD, DM, COPD, CVA who presents for evaluation of back and chest pain s/p fall. Patient fell 3 days ago. He was trying to get up from his bed to urinate when he was turning around and slipped and fell. He hit his head on a nightstand in his back on a plastic trash can and the floor. No LOC. Patient comes in today for continuous pain worse on the left chest wall but also on his back. He reports that the pain is in moderate, throbbing, constant, located in his chest wall and in his back since the fall, nonradiating. He has tried Tylenol at home with no improvement of the pain.  he denies saddle anesthesia, weakness or numbness of his extremities, urinary or bowel incontinence or retention. He also denies coughing, shortness of breath, chills or fever.   Past Medical History:  Diagnosis Date  . Chronic atrial fibrillation (HCC)    a. on Couamdin; b. CHADS2VASc at least 7 (CHF, HTN, age x 2, DM, stroke x 2); c. s/p MAZE 1999  . Chronic systolic CHF (congestive heart failure) (HCC)    a. echo 08/01/2015: EF of 45%, mild LVH, mitral valve with annular calcification, mitral valve partially mobile  . CKD (chronic kidney disease), stage III   . COPD (chronic obstructive pulmonary disease) (HCC)   . Diabetes mellitus without complication (HCC)   . Hypertension   . Hypothyroidism   . Legally blind   . Mitral valve prolapse    a. s/p mitral valve repair 1999  . Normal coronary arteries    a. by cardiac cath 1999  . Parkinson's disease (HCC)   . Stroke (HCC)   . TIA (transient ischemic  attack)   . UTI (lower urinary tract infection)     Patient Active Problem List   Diagnosis Date Noted  . Acute exacerbation of chronic obstructive pulmonary disease (COPD) (HCC) 12/07/2015  . Acute on chronic respiratory failure with hypoxia (HCC) 09/28/2015  . HTN (hypertension) 09/25/2015  . Parkinson's disease (HCC) 09/25/2015  . Hypothyroidism 09/25/2015  . COPD exacerbation (HCC) 09/12/2015  . Diabetes (HCC) 08/20/2015  . SSS (sick sinus syndrome) (HCC) 08/07/2015  . Chronic atrial fibrillation (HCC)   . Sinus pause   . Sick sinus syndrome (HCC)   . Acute on chronic diastolic CHF (congestive heart failure), NYHA class 1 (HCC) 08/03/2015  . Lower extremity weakness 02/25/2015    Past Surgical History:  Procedure Laterality Date  . CARDIAC CATHETERIZATION N/A 08/08/2015   Procedure: Temporary Pacemaker;  Surgeon: Peter M Swaziland, MD;  Location: [facility] INVASIVE CV LAB;  Service: Cardiovascular;  Laterality: N/A;  . CARDIAC SURGERY    . CHOLECYSTECTOMY    . EP IMPLANTABLE DEVICE N/A 08/08/2015   Procedure: Pacemaker Implant;  Surgeon: Will Jorja Loa, MD;  Location: [facility] INVASIVE CV LAB;  Service: Cardiovascular;  Laterality: N/A;    Prior to Admission medications   Medication Sig Start Date End Date Taking? Authorizing Provider  albuterol (PROVENTIL HFA) 108 (90 Base) MCG/ACT inhaler Inhale 2 puffs into the lungs every 4 (four) hours as needed for wheezing or  shortness of breath. Patient taking differently: Inhale 2 puffs into the lungs 4 (four) times daily.  07/11/15   Sharman CheekPhillip Stafford, MD  albuterol (PROVENTIL) (2.5 MG/3ML) 0.083% nebulizer solution Take 2.5 mg by nebulization 4 (four) times daily.     Historical Provider, MD  carbidopa-levodopa (SINEMET IR) 25-250 MG tablet Take 1 tablet by mouth 3 (three) times daily.    Historical Provider, MD  cetirizine (ZYRTEC) 10 MG tablet Take 10 mg by mouth daily.    Historical Provider, MD  fluticasone-salmeterol (ADVAIR HFA) 115-21  MCG/ACT inhaler Inhale 2 puffs into the lungs 2 (two) times daily. Patient not taking: Reported on 12/13/2015 12/11/15   Alford Highlandichard Wieting, MD  furosemide (LASIX) 40 MG tablet Take 20-40 mg by mouth 2 (two) times daily. 40 mg every morning and 20 mg every evening    Historical Provider, MD  glipiZIDE (GLUCOTROL XL) 5 MG 24 hr tablet Take 1 tablet (5 mg total) by mouth daily with breakfast. Patient not taking: Reported on 12/13/2015 12/11/15   Alford Highlandichard Wieting, MD  insulin glargine (LANTUS) 100 UNIT/ML injection Inject 0.24 mLs (24 Units total) into the skin daily. Patient not taking: Reported on 12/13/2015 12/11/15   Alford Highlandichard Wieting, MD  insulin regular human CONCENTRATED (HUMULIN R) 500 UNIT/ML injection Inject 8-12 Units into the skin 2 (two) times daily with a meal. 12 units every morning and 8 units every evening    Historical Provider, MD  lisinopril (PRINIVIL,ZESTRIL) 40 MG tablet Take 40 mg by mouth at bedtime.     Historical Provider, MD  metoprolol succinate (TOPROL-XL) 25 MG 24 hr tablet Take 1 tablet (25 mg total) by mouth every evening. Take with or immediately following a meal. 08/06/15   Delfino LovettVipul Shah, MD  polyethylene glycol (MIRALAX / GLYCOLAX) packet Take 17 g by mouth daily. Patient not taking: Reported on 12/13/2015 12/11/15   Alford Highlandichard Wieting, MD  potassium chloride (MICRO-K) 10 MEQ CR capsule Take 10 mEq by mouth daily.    Historical Provider, MD  predniSONE (DELTASONE) 5 MG tablet Take 1 tablet (5 mg total) by mouth daily with breakfast. 12/22/15   Altamese DillingVaibhavkumar Vachhani, MD  predniSONE (STERAPRED UNI-PAK 21 TAB) 10 MG (21) TBPK tablet Take 6 tabs first day, 5 tab on day 2, then 4 on day 3rd, 3 tabs on day 4th , 2 tab on day 5th, and 1 tab on 6th day. 12/16/15   Altamese DillingVaibhavkumar Vachhani, MD  saxagliptin HCl (ONGLYZA) 5 MG TABS tablet Take 5 mg by mouth daily.    Historical Provider, MD  warfarin (COUMADIN) 3 MG tablet Take 3 mg by mouth every evening. Pt takes on Tuesday, Thursday, Saturday and Sunday     Historical Provider, MD  warfarin (COUMADIN) 5 MG tablet Take 5 mg by mouth every evening. Takes Monday, Wednesday, and Friday    Historical Provider, MD    Allergies Patient has no known allergies.  Family History  Problem Relation Age of Onset  . Hypertension Mother   . Stroke Mother   . CAD Father   . Lymphoma Sister   . Lung disease Brother     Social History Social History  Substance Use Topics  . Smoking status: Former Smoker    Packs/day: 4.00    Years: 20.00    Types: Cigarettes  . Smokeless tobacco: Never Used  . Alcohol use No    Review of Systems Constitutional: Negative for fever. Eyes: Negative for visual changes. ENT: Negative for facial injury or neck injury Cardiovascular: Negative for  chest pain Respiratory: Negative for shortness of breath. + chest wall injury. Gastrointestinal: Negative for abdominal pain or injury. Genitourinary: Negative for dysuria. Musculoskeletal: + back injury. No negative for arm or leg pain. Skin: Negative for laceration/abrasions. Neurological: Negative for head injury.  ___________________________________________   PHYSICAL EXAM:  VITAL SIGNS: ED Triage Vitals [03/19/16 1156]  Enc Vitals Group     BP (!) 88/60     Pulse Rate 84     Resp (!) 21     Temp 97.4 F (36.3 C)     Temp Source Oral     SpO2 94 %     Weight 244 lb (110.7 kg)     Height 5' 9.5" (1.765 m)     Head Circumference      Peak Flow      Pain Score 8     Pain Loc      Pain Edu?      Excl. in GC?     Constitutional: Alert and oriented. No acute distress. Does not appear intoxicated. HEENT Head: Normocephalic and atraumatic. Face: No facial bony tenderness. Stable midface Ears: No hemotympanum bilaterally. No Battle sign Eyes: No eye injury. PERRL. No raccoon eyes Nose: Nontender. No epistaxis. No rhinorrhea Mouth/Throat: Mucous membranes are moist. No oropharyngeal blood. No dental injury. Airway patent without stridor. Normal  voice. Neck: no C-collar in place. No midline c-spine tenderness.  Cardiovascular: Normal rate, regular rhythm. Normal and symmetric distal pulses are present in all extremities. Pulmonary/Chest: Chest wall is stable and nontender to palpation/compression. Normal respiratory effort. Breath sounds are normal. No crepitus.  Abdominal: Soft, nontender, non distended. Musculoskeletal: Nontender with normal full range of motion in all extremities. No deformities. No thoracic or lumbar midline spinal tenderness. Pelvis is stable. Skin: Large bruise to the left lateral/ posterior chest wall and over the right flank.  Psychiatric: Speech and behavior are appropriate. Neurological: Normal speech and language. Moves all extremities to command. No gross focal neurologic deficits are appreciated.  Glascow Coma Score: 4 - Opens eyes on own 6 - Follows simple motor commands 5 - Alert and oriented GCS: 15   ____________________________________________   LABS (all labs ordered are listed, but only abnormal results are displayed)  Labs Reviewed  CBC WITH DIFFERENTIAL/PLATELET - Abnormal; Notable for the following:       Result Value   WBC 11.8 (*)    RDW 15.5 (*)    Neutro Abs 9.4 (*)    All other components within normal limits  PROTIME-INR - Abnormal; Notable for the following:    Prothrombin Time 34.8 (*)    All other components within normal limits  BASIC METABOLIC PANEL - Abnormal; Notable for the following:    Glucose, Bld 177 (*)    BUN 61 (*)    Creatinine, Ser 2.17 (*)    Calcium 8.7 (*)    GFR calc non Af Amer 25 (*)    GFR calc Af Amer 29 (*)    All other components within normal limits  MAGNESIUM   ____________________________________________  EKG  ED ECG REPORT I, Nita Sickle, the attending physician, personally viewed and interpreted this ECG.  Normal sinus rhythm, with frequent PVCs and occasional paced rhythm, normal QTc, normal axis,  No STE or  depressions. ____________________________________________  RADIOLOGY  Reviewed by me  ____________________________________________   PROCEDURES  Procedure(s) performed:Yes Procedures   BEDSIDE FAST: Negative for intra-abdominal fluid in RUQ, LUQ and suprapubic view. Limited heart window due to body habitus  Critical Care performed: None ____________________________________________   INITIAL IMPRESSION / ASSESSMENT AND PLAN / ED COURSE   82 y.o. male history of atrial fibrillation on warfarin, CHF (EF 50-55% on 5/17), CKD, DM, COPD, CVA who presents for evaluation of back and chest pain s/p fall 3 days ago. Patient has large bruising on his left chest wall and right flank. Systolic blood pressure between 80s and 90s which is atypical for patient (his BP is usually 120s to 140s). FAST negative although unable to visualize the heart window due to body habitus. No signs or symptoms or basilar skull fracture. Patient was given 500 cc bolus, fentanyl for pain. Plan for CT chest abdomen, head, cervical spine.   Clinical Course    Labs showing AKI. CT c/a/p showing chest wall contusion but no other injuries. BP improved after IVF. Will admit to Hospitalist.  Pertinent labs & imaging results that were available during my care of the patient were reviewed by me and considered in my medical decision making (see chart for details).    ____________________________________________   FINAL CLINICAL IMPRESSION(S) / ED DIAGNOSES  Final diagnoses:  Fall, initial encounter  AKI (acute kidney injury) (HCC)  Supratherapeutic INR  Contusion of chest wall, unspecified laterality, initial encounter      NEW MEDICATIONS STARTED DURING THIS VISIT:  New Prescriptions   No medications on file     Note:  This document was prepared using Dragon voice recognition software and may include unintentional dictation errors.    Nita Sicklearolina Lindy Garczynski, MD 03/19/16 [phone]

## 2016-03-20 ENCOUNTER — Inpatient Hospital Stay: Payer: Medicare Other

## 2016-03-20 ENCOUNTER — Encounter: Payer: Self-pay | Admitting: Radiology

## 2016-03-20 DIAGNOSIS — N179 Acute kidney failure, unspecified: Secondary | ICD-10-CM | POA: Diagnosis not present

## 2016-03-20 LAB — URINALYSIS COMPLETE WITH MICROSCOPIC (ARMC ONLY)
BACTERIA UA: NONE SEEN
Bilirubin Urine: NEGATIVE
HGB URINE DIPSTICK: NEGATIVE
Ketones, ur: NEGATIVE mg/dL
LEUKOCYTES UA: NEGATIVE
Nitrite: NEGATIVE
PH: 5 (ref 5.0–8.0)
PROTEIN: NEGATIVE mg/dL
SQUAMOUS EPITHELIAL / LPF: NONE SEEN
Specific Gravity, Urine: 1.011 (ref 1.005–1.030)

## 2016-03-20 LAB — BASIC METABOLIC PANEL
ANION GAP: 4 — AB (ref 5–15)
BUN: 44 mg/dL — ABNORMAL HIGH (ref 6–20)
CALCIUM: 8.4 mg/dL — AB (ref 8.9–10.3)
CO2: 27 mmol/L (ref 22–32)
Chloride: 108 mmol/L (ref 101–111)
Creatinine, Ser: 1.42 mg/dL — ABNORMAL HIGH (ref 0.61–1.24)
GFR calc Af Amer: 49 mL/min — ABNORMAL LOW (ref >60)
GFR calc non Af Amer: 42 mL/min — ABNORMAL LOW (ref >60)
GLUCOSE: 136 mg/dL — AB (ref 65–99)
Potassium: 5.1 mmol/L (ref 3.5–5.1)
Sodium: 139 mmol/L (ref 135–145)

## 2016-03-20 LAB — CBC
HCT: 39.1 % — ABNORMAL LOW (ref 40.0–52.0)
HEMOGLOBIN: 13.2 g/dL (ref 13.0–18.0)
MCH: 30.5 pg (ref 26.0–34.0)
MCHC: 33.6 g/dL (ref 32.0–36.0)
MCV: 90.6 fL (ref 80.0–100.0)
Platelets: 144 10*3/uL — ABNORMAL LOW (ref 150–440)
RBC: 4.32 MIL/uL — ABNORMAL LOW (ref 4.40–5.90)
RDW: 15.2 % — ABNORMAL HIGH (ref 11.5–14.5)
WBC: 9.8 10*3/uL (ref 3.8–10.6)

## 2016-03-20 LAB — GLUCOSE, CAPILLARY
GLUCOSE-CAPILLARY: 130 mg/dL — AB (ref 65–99)
Glucose-Capillary: 170 mg/dL — ABNORMAL HIGH (ref 65–99)
Glucose-Capillary: 199 mg/dL — ABNORMAL HIGH (ref 65–99)
Glucose-Capillary: 206 mg/dL — ABNORMAL HIGH (ref 65–99)

## 2016-03-20 LAB — PROTIME-INR
INR: 3.28
PROTHROMBIN TIME: 34.1 s — AB (ref 11.4–15.2)

## 2016-03-20 LAB — CK: CK TOTAL: 42 U/L — AB (ref 49–397)

## 2016-03-20 MED ORDER — TECHNETIUM TO 99M ALBUMIN AGGREGATED
4.1650 | Freq: Once | INTRAVENOUS | Status: AC | PRN
  Administered 2016-03-20: 4.165 via INTRAVENOUS

## 2016-03-20 MED ORDER — TECHNETIUM TC 99M DIETHYLENETRIAME-PENTAACETIC ACID
33.0000 | Freq: Once | INTRAVENOUS | Status: AC | PRN
  Administered 2016-03-20: 32.811 via INTRAVENOUS

## 2016-03-20 NOTE — Progress Notes (Signed)
Per Dr. Winona LegatoVaickute give insulin after patient has received his meal. PLace order for hemoglobin A1C

## 2016-03-20 NOTE — Progress Notes (Signed)
Okay per Dr. Winona LegatoVaickute to discontinue trajenta

## 2016-03-20 NOTE — Progress Notes (Signed)
[facility]   PATIENT NAME: Troy [name]    MR#:  [id number]  DATE OF BIRTH:  [birth year]  SUBJECTIVE:  CHIEF COMPLAINT:   Chief Complaint  Patient presents with  . Back Pain  . Chest Pain    ribs hurt due to fall sunday  . Fall  The patient is 82 year old male who presents to the hospital with complaints of fall, pain on the left side of the chest after fall. He also noted to be weak. In the hospital. He was noted to be hypotensive. The patient had no shortness of breath, but remains on 2 L of oxygen nasal cannula at present. He was rehydrated and his blood pressure improved. Initial labs revealed acute and chronic renal failure, creatinine level of 2.17, creatinine has improved to 1.42 today. Baseline creatinine was 1.2 in September 2017.   Review of Systems  Cardiovascular: Positive for chest pain.    VITAL SIGNS: Blood pressure 134/77, pulse 77, temperature 98.1 F (36.7 C), temperature source Oral, resp. rate 18, height 5' 9.5" (1.765 m), weight 110.7 kg (244 lb), SpO2 98 %.  PHYSICAL EXAMINATION:   GENERAL:  82 y.o.-year-old patient lying in the bed with no acute distress.  EYES: Pupils equal, round, reactive to light and accommodation. No scleral icterus. Extraocular muscles intact.  HEENT: Head atraumatic, normocephalic. Oropharynx and nasopharynx clear.  NECK:  Supple, no jugular venous distention. No thyroid enlargement, no tenderness.  LUNGS: Normal breath sounds bilaterally, no wheezing, rales,rhonchi or crepitation. No use of accessory muscles of respiration.  CARDIOVASCULAR: S1, S2 normal. No murmurs, rubs, or gallops.  ABDOMEN: Soft, nontender, nondistended. Bowel sounds present. No organomegaly or mass.  EXTREMITIES: No pedal edema, cyanosis, or clubbing.  NEUROLOGIC: Cranial nerves II through XII are intact. Muscle strength 5/5 in all extremities. Sensation intact. Gait not checked.  PSYCHIATRIC: The patient is  alert and oriented x 3.  SKIN: No obvious rash, lesion, or ulcer.   ORDERS/RESULTS REVIEWED:   CBC  Recent Labs Lab 03/19/16 1213 03/20/16 0509  WBC 11.8* 9.8  HGB 13.9 13.2  HCT 41.8 39.1*  PLT 165 144*  MCV 90.7 90.6  MCH 30.0 30.5  MCHC 33.1 33.6  RDW 15.5* 15.2*  LYMPHSABS 1.3  --   MONOABS 0.9  --   EOSABS 0.2  --   BASOSABS 0.1  --    ------------------------------------------------------------------------------------------------------------------  Chemistries   Recent Labs Lab 03/19/16 1213 03/20/16 0509  NA 138 139  K 5.1 5.1  CL 106 108  CO2 26 27  GLUCOSE 177* 136*  BUN 61* 44*  CREATININE 2.17* 1.42*  CALCIUM 8.7* 8.4*  MG 2.0  --    ------------------------------------------------------------------------------------------------------------------ estimated creatinine clearance is 42.7 mL/min (by C-G formula based on SCr of 1.42 mg/dL (H)). ------------------------------------------------------------------------------------------------------------------ No results for input(s): TSH, T4TOTAL, T3FREE, THYROIDAB in the last 72 hours.  Invalid input(s): FREET3  Cardiac Enzymes No results for input(s): CKMB, TROPONINI, MYOGLOBIN in the last 168 hours.  Invalid input(s): CK ------------------------------------------------------------------------------------------------------------------ Invalid input(s): POCBNP ---------------------------------------------------------------------------------------------------------------  RADIOLOGY: Ct Abdomen Pelvis Wo Contrast  Result Date: 03/19/2016 CLINICAL DATA:  Fall with rib and back pain. Left rib bruising. Initial encounter. EXAM: CT CHEST, ABDOMEN AND PELVIS WITHOUT CONTRAST TECHNIQUE: Multidetector CT imaging of the chest, abdomen and pelvis was performed following the standard protocol without IV contrast. COMPARISON:  02/25/2015 FINDINGS: CT CHEST FINDINGS Cardiovascular: Chronic cardiomegaly. No  pericardial effusion. Single chamber pacer into the right ventricle. Status post mitral valve  replacement. Small volume gas in the right ventricle, usually from IV access. Mediastinum/Nodes: No adenopathy or hematoma. Lungs/Pleura: No hemothorax, pneumothorax, or lung contusion. No consolidation or edema. Musculoskeletal: See below CT ABDOMEN PELVIS FINDINGS Hepatobiliary: No evidence of injury. Pancreas: Cholecystectomy.  Normal common bile duct diameter. Spleen: No evidence of injury. Adrenals/Urinary Tract: No evidence of injury. Bilateral renal cysts as characterized by enhanced CT in 2016. No hydronephrosis. Stomach/Bowel: No evidence of injury. Extensive colonic diverticulosis. Vascular/Lymphatic: Atherosclerotic calcification on the aorta and iliacs. No acute finding. Negative for adenopathy. Reproductive: No acute finding Other: No ascites or pneumoperitoneum Musculoskeletal: Subcutaneous reticulation and nodularity along the lower left chest wall consistent with contusion in this patient with reported ecchymosis in this region. Remote and healed left rib fractures.  No acute fracture noted. Lower lumbar facet arthropathy with prominent hypertrophy and canal stenosis. Unremarkable sacral stimulator on the left. Diffuse idiopathic skeletal hyperostosis with ankylosis throughout the thoracic spine. IMPRESSION: Left chest wall contusion without acute fracture or intrathoracic injury. No acute finding in the abdomen. Chronic findings are stable from prior and described above. Electronically Signed   By: Marnee SpringJonathon  Watts M.D.   On: 03/19/2016 14:31   Ct Head Wo Contrast  Result Date: 03/19/2016 CLINICAL DATA:  Larey SeatFell 3 days ago upon standing. Trauma to the head and neck. EXAM: CT HEAD WITHOUT CONTRAST CT CERVICAL SPINE WITHOUT CONTRAST TECHNIQUE: Multidetector CT imaging of the head and cervical spine was performed following the standard protocol without intravenous contrast. Multiplanar CT image  reconstructions of the cervical spine were also generated. COMPARISON:  08/07/2015 FINDINGS: CT HEAD FINDINGS Brain: The brain shows generalized atrophy. There chronic small-vessel ischemic changes of the deep white matter. No sign of acute or subacute infarction, mass lesion, hemorrhage, hydrocephalus or extra-axial collection. Vascular: There is atherosclerotic calcification of the major vessels at the base of the brain. Skull: No fracture or focal lesion. Sinuses/Orbits: Few opacified mastoid air cells on the right, not likely significant. Possible nasal polyps as chronically noted. Other: None significant CT CERVICAL SPINE FINDINGS Alignment: Normal alignment. Skull base and vertebrae: No fracture. Chronic degenerative arthritis at C1-2 in chronic degenerative spondylosis and facet arthropathy in the lower cervical region. Soft tissues and spinal canal: Negative Disc levels: Degenerative spondylosis most pronounced from C3-C7. Facet arthropathy most pronounced on the left at C2-3 and C3-4. No severe foraminal stenosis. No likely significant canal stenosis. Upper chest: Negative Other: None IMPRESSION: Head CT: No acute or traumatic finding. Age related atrophy. Chronic nasal polyps or polypoid masses. Cervical spine CT: No acute or traumatic finding. Chronic degenerative changes as discussed above Electronically Signed   By: Paulina FusiMark  Shogry M.D.   On: 03/19/2016 14:26   Ct Chest Wo Contrast  Result Date: 03/19/2016 CLINICAL DATA:  Fall with rib and back pain. Left rib bruising. Initial encounter. EXAM: CT CHEST, ABDOMEN AND PELVIS WITHOUT CONTRAST TECHNIQUE: Multidetector CT imaging of the chest, abdomen and pelvis was performed following the standard protocol without IV contrast. COMPARISON:  02/25/2015 FINDINGS: CT CHEST FINDINGS Cardiovascular: Chronic cardiomegaly. No pericardial effusion. Single chamber pacer into the right ventricle. Status post mitral valve replacement. Small volume gas in the right  ventricle, usually from IV access. Mediastinum/Nodes: No adenopathy or hematoma. Lungs/Pleura: No hemothorax, pneumothorax, or lung contusion. No consolidation or edema. Musculoskeletal: See below CT ABDOMEN PELVIS FINDINGS Hepatobiliary: No evidence of injury. Pancreas: Cholecystectomy.  Normal common bile duct diameter. Spleen: No evidence of injury. Adrenals/Urinary Tract: No evidence of injury. Bilateral renal cysts as  characterized by enhanced CT in 2016. No hydronephrosis. Stomach/Bowel: No evidence of injury. Extensive colonic diverticulosis. Vascular/Lymphatic: Atherosclerotic calcification on the aorta and iliacs. No acute finding. Negative for adenopathy. Reproductive: No acute finding Other: No ascites or pneumoperitoneum Musculoskeletal: Subcutaneous reticulation and nodularity along the lower left chest wall consistent with contusion in this patient with reported ecchymosis in this region. Remote and healed left rib fractures.  No acute fracture noted. Lower lumbar facet arthropathy with prominent hypertrophy and canal stenosis. Unremarkable sacral stimulator on the left. Diffuse idiopathic skeletal hyperostosis with ankylosis throughout the thoracic spine. IMPRESSION: Left chest wall contusion without acute fracture or intrathoracic injury. No acute finding in the abdomen. Chronic findings are stable from prior and described above. Electronically Signed   By: Marnee SpringJonathon  Watts M.D.   On: 03/19/2016 14:31   Ct Cervical Spine Wo Contrast  Result Date: 03/19/2016 CLINICAL DATA:  Larey SeatFell 3 days ago upon standing. Trauma to the head and neck. EXAM: CT HEAD WITHOUT CONTRAST CT CERVICAL SPINE WITHOUT CONTRAST TECHNIQUE: Multidetector CT imaging of the head and cervical spine was performed following the standard protocol without intravenous contrast. Multiplanar CT image reconstructions of the cervical spine were also generated. COMPARISON:  08/07/2015 FINDINGS: CT HEAD FINDINGS Brain: The brain shows  generalized atrophy. There chronic small-vessel ischemic changes of the deep white matter. No sign of acute or subacute infarction, mass lesion, hemorrhage, hydrocephalus or extra-axial collection. Vascular: There is atherosclerotic calcification of the major vessels at the base of the brain. Skull: No fracture or focal lesion. Sinuses/Orbits: Few opacified mastoid air cells on the right, not likely significant. Possible nasal polyps as chronically noted. Other: None significant CT CERVICAL SPINE FINDINGS Alignment: Normal alignment. Skull base and vertebrae: No fracture. Chronic degenerative arthritis at C1-2 in chronic degenerative spondylosis and facet arthropathy in the lower cervical region. Soft tissues and spinal canal: Negative Disc levels: Degenerative spondylosis most pronounced from C3-C7. Facet arthropathy most pronounced on the left at C2-3 and C3-4. No severe foraminal stenosis. No likely significant canal stenosis. Upper chest: Negative Other: None IMPRESSION: Head CT: No acute or traumatic finding. Age related atrophy. Chronic nasal polyps or polypoid masses. Cervical spine CT: No acute or traumatic finding. Chronic degenerative changes as discussed above Electronically Signed   By: Paulina FusiMark  Shogry M.D.   On: 03/19/2016 14:26   Dg Chest Portable 1 View  Result Date: 03/19/2016 CLINICAL DATA:  Rib and back pain after a fall on Sunday. Initial encounter. EXAM: PORTABLE CHEST 1 VIEW COMPARISON:  The 02/2016. FINDINGS: Trachea is midline. Heart is enlarged. Pacemaker lead tip is in the right ventricle. Lungs are somewhat low in volume with minimal bibasilar subsegmental atelectasis. No airspace consolidation or pleural fluid. No pneumothorax. Old left rib fractures. IMPRESSION: No acute findings. Electronically Signed   By: Leanna BattlesMelinda  Blietz M.D.   On: 03/19/2016 12:59    EKG:  Orders placed or performed during the hospital encounter of 03/19/16  . EKG 12-Lead  . EKG 12-Lead  . EKG 12-Lead  . EKG  12-Lead    ASSESSMENT AND PLAN:  Active Problems:   Acute renal failure (ARF) (HCC)  #1. Acute on chronic renal failure, improved with IV fluid administration, likely diuretic, hypotension related, continue IV fluids and reassess creatinine in the morning. bladderscan was done in the hospital, no urinary retention. Urinalysis was unremarkable, no obvious urinary tract infection #2 hypotension, resolved, holding diuretics #3 hypoxia, get VQ scan to rule out pulmonary embolism #4generalized weakness, get physical therapist involved  for recommendations   Management plans discussed with the patient, family and they are in agreement.   DRUG ALLERGIES: No Known Allergies  CODE STATUS:     Code Status Orders        Start     Ordered   03/19/16 1655  Do not attempt resuscitation (DNR)  Continuous    Question Answer Comment  In the event of cardiac or respiratory ARREST Do not call a "code blue"   In the event of cardiac or respiratory ARREST Do not perform Intubation, CPR, defibrillation or ACLS   In the event of cardiac or respiratory ARREST Use medication by any route, position, wound care, and other measures to relive pain and suffering. May use oxygen, suction and manual treatment of airway obstruction as needed for comfort.      03/19/16 1654    Code Status History    Date Active Date Inactive Code Status Order ID Comments User Context   12/14/2015 12:09 PM 12/16/2015  7:58 PM DNR [id number]  Altamese Dilling, MD Inpatient   12/13/2015  8:55 PM 12/14/2015 12:09 PM Full Code [id number]  Tonye Royalty, DO ED   12/13/2015  8:50 PM 12/13/2015  8:55 PM DNR [id number]  Tonye Royalty, DO ED   12/07/2015  6:27 AM 12/11/2015  5:06 PM DNR [id number]  Ihor Austin, MD ED   09/25/2015  1:18 AM 09/28/2015  8:53 PM DNR [id number]  Oralia Manis, MD Inpatient   08/07/2015  7:50 PM 08/09/2015  3:23 PM DNR [id number]  Darrol Jump, PA-C Inpatient   08/03/2015  4:58 PM 08/07/2015  7:50 PM DNR [id number]   Houston Siren, MD ED   02/25/2015  7:56 PM 03/01/2015  7:21 PM Full Code [id number]  Gale Journey, MD ED    Advance Directive Documentation   Flowsheet Row Most Recent Value  Type of Advance Directive  Living will, Healthcare Power of Attorney  Pre-existing out of facility DNR order (yellow form or pink MOST form)  No data  "MOST" Form in Place?  No data      TOTAL TIME TAKING CARE OF THIS PATIENT: 40 minutes.    Katharina Caper M.D on 03/20/2016 at 1:35 PM  Between 7am to 6pm - Pager - [phone]  After 6pm go to [email or web address] - password [email or web address] [facility]  [facility] [place]  Office  [phone]  CC: Primary care physician; Barbette Reichmann, MD

## 2016-03-20 NOTE — Progress Notes (Signed)
PT Cancellation Note  Patient Details Name: Troy BurnRobert [name] MRN: [id number] DOB: 01/24/1924   Cancelled Treatment:    Reason Eval/Treat Not Completed: Medical issues which prohibited therapy (Consult received and chart reviewed.  Patient with pending order for VQ scan to rule out PE.  Will hold until test complete, results received and patient cleared for activity.)   [name], PT, DPT, NCS 03/20/16, 2:01 PM [phone]

## 2016-03-21 DIAGNOSIS — N179 Acute kidney failure, unspecified: Secondary | ICD-10-CM | POA: Diagnosis not present

## 2016-03-21 DIAGNOSIS — D72829 Elevated white blood cell count, unspecified: Secondary | ICD-10-CM

## 2016-03-21 DIAGNOSIS — S20212A Contusion of left front wall of thorax, initial encounter: Secondary | ICD-10-CM

## 2016-03-21 DIAGNOSIS — W19XXXA Unspecified fall, initial encounter: Secondary | ICD-10-CM

## 2016-03-21 DIAGNOSIS — R0902 Hypoxemia: Secondary | ICD-10-CM

## 2016-03-21 DIAGNOSIS — I959 Hypotension, unspecified: Secondary | ICD-10-CM

## 2016-03-21 DIAGNOSIS — E860 Dehydration: Secondary | ICD-10-CM

## 2016-03-21 DIAGNOSIS — R531 Weakness: Secondary | ICD-10-CM

## 2016-03-21 LAB — PROTIME-INR
INR: 2.07
PROTHROMBIN TIME: 23.6 s — AB (ref 11.4–15.2)

## 2016-03-21 LAB — HEMOGLOBIN A1C
HEMOGLOBIN A1C: 9.1 % — AB (ref 4.8–5.6)
Mean Plasma Glucose: 214 mg/dL

## 2016-03-21 LAB — CREATININE, SERUM
Creatinine, Ser: 1.09 mg/dL (ref 0.61–1.24)
GFR calc non Af Amer: 58 mL/min — ABNORMAL LOW (ref >60)

## 2016-03-21 LAB — GLUCOSE, CAPILLARY
GLUCOSE-CAPILLARY: 219 mg/dL — AB (ref 65–99)
Glucose-Capillary: 119 mg/dL — ABNORMAL HIGH (ref 65–99)
Glucose-Capillary: 308 mg/dL — ABNORMAL HIGH (ref 65–99)

## 2016-03-21 LAB — POTASSIUM: POTASSIUM: 4.7 mmol/L (ref 3.5–5.1)

## 2016-03-21 MED ORDER — WARFARIN SODIUM 4 MG PO TABS
4.0000 mg | ORAL_TABLET | Freq: Once | ORAL | Status: DC
Start: 2016-03-21 — End: 2016-03-21
  Filled 2016-03-21: qty 1

## 2016-03-21 MED ORDER — FUROSEMIDE 40 MG PO TABS: 40.0000 mg | ORAL_TABLET | Freq: Every day | ORAL | 6 refills | Status: DC

## 2016-03-21 MED ORDER — WARFARIN - PHARMACIST DOSING INPATIENT: Freq: Every day | Status: DC

## 2016-03-21 NOTE — Progress Notes (Signed)
Patient discharged to home. Concerns addressed. IV site removed. Pt wife and caregiver present. Discharge summary given to patient.

## 2016-03-21 NOTE — Discharge Summary (Signed)
[facility] [place] at [facility]   PATIENT NAME: Troy [name]    MR#:  [id number]  DATE OF BIRTH:  1923-05-18  DATE OF ADMISSION:  03/19/2016 ADMITTING PHYSICIAN: Auburn Bilberry, MD  DATE OF DISCHARGE: No discharge date for patient encounter.  PRIMARY CARE PHYSICIAN: HANDE,VISHWANATH, MD     ADMISSION DIAGNOSIS:  AKI (acute kidney injury) (HCC) [N17.9] Supratherapeutic INR [R79.1] Fall, initial encounter [W19.XXXA] Contusion of chest wall, unspecified laterality, initial encounter [S20.219A]  DISCHARGE DIAGNOSIS:  Active Problems:   Acute on chronic renal failure (HCC)   Dehydration   Hypotension   Fall   Chest wall contusion, left, initial encounter   Hypoxia   Generalized weakness   Leukocytosis   SECONDARY DIAGNOSIS:   Past Medical History:  Diagnosis Date  . Chronic atrial fibrillation (HCC)    a. on Couamdin; b. CHADS2VASc at least 7 (CHF, HTN, age x 2, DM, stroke x 2); c. s/p MAZE 1999  . Chronic systolic CHF (congestive heart failure) (HCC)    a. echo 08/01/2015: EF of 45%, mild LVH, mitral valve with annular calcification, mitral valve partially mobile  . CKD (chronic kidney disease), stage III   . COPD (chronic obstructive pulmonary disease) (HCC)   . Diabetes mellitus without complication (HCC)   . Hypertension   . Hypothyroidism   . Legally blind   . Mitral valve prolapse    a. s/p mitral valve repair 1999  . Normal coronary arteries    a. by cardiac cath 1999  . Parkinson's disease (HCC)   . Stroke (HCC)   . TIA (transient ischemic attack)   . UTI (lower urinary tract infection)     .pro HOSPITAL COURSE:  The patient is 82 year old male who presents to the hospital with complaints of fall, pain on the left side of the chest after fall. He also noted to be weak,  hypotensive. The patient had no shortness of breath,Was weaned off oxygen and O2 sats were 95-96% on room air. He was rehydrated and his blood pressure  improved. Initial labs revealed acute on chronic renal failure, creatinine level of 2.17, creatinine has improved to 1.09 today. Baseline creatinine was 1.2 in September 2017. It was felt that patient was dehydrated due to multiple diuretics. He was using for lower extremity swelling. Diuretic doses were decreased. Since patient's oral intake was fluctuating, blood pressure medications were stopped, to be restarted by primary care physician. The patient was evaluated by physical therapist and home health services were recommended. Patient was felt to be stable to be discharged home. Discussion by problem: #1. Acute on chronic renal failure, resolved with IV fluid administration, likely diuretic, hypotension related Bladder scan was done in the hospital, no urinary retention was noted. Urinalysis was unremarkable, no obvious urinary tract infection.  #2 hypotension, resolved, holding blood pressure medications, to be restarted as outpatient by primary care physician read #3 hypoxia, VQ scan was low probability for pulmonary embolism, patient was weaned off oxygen therapy and his O2 sats were 95% on room air. #4 generalized weakness, physical therapist recommended home health services #5. Dehydration, decrease diuretic dose #6. Leukocytosis, resolved on no antibiotic therapy, no signs of infection #7. Left chest wall contusion, Aspercreme topically, warm compresses  DISCHARGE CONDITIONS:   Stable  CONSULTS OBTAINED:    DRUG ALLERGIES:  No Known Allergies  DISCHARGE MEDICATIONS:   Current Discharge Medication List    CONTINUE these medications which have CHANGED   Details  furosemide (LASIX) 40 MG  tablet Take 1 tablet (40 mg total) by mouth daily. Qty: 30 tablet, Refills: 6      CONTINUE these medications which have NOT CHANGED   Details  albuterol (PROVENTIL HFA) 108 (90 Base) MCG/ACT inhaler Inhale 2 puffs into the lungs every 4 (four) hours as needed for wheezing or shortness of  breath. Qty: 1 Inhaler, Refills: 0    albuterol (PROVENTIL) (2.5 MG/3ML) 0.083% nebulizer solution Take 2.5 mg by nebulization 3 (three) times daily.     carbidopa-levodopa (SINEMET IR) 25-250 MG tablet Take 1 tablet by mouth 3 (three) times daily.    cetirizine (ZYRTEC) 10 MG tablet Take 10 mg by mouth daily.    glipiZIDE (GLUCOTROL XL) 5 MG 24 hr tablet Take 1 tablet (5 mg total) by mouth daily with breakfast. Qty: 30 tablet, Refills: 0    insulin glargine (LANTUS) 100 UNIT/ML injection Inject 0.24 mLs (24 Units total) into the skin daily. Qty: 10 mL, Refills: 0    insulin regular human CONCENTRATED (HUMULIN R) 500 UNIT/ML injection Inject 5 Units into the skin 2 (two) times daily with a meal. 5 units every morning and 5 units every evening    potassium chloride (MICRO-K) 10 MEQ CR capsule Take 10 mEq by mouth daily.    predniSONE (DELTASONE) 5 MG tablet Take 1 tablet (5 mg total) by mouth daily with breakfast. Qty: 30 tablet, Refills: 0    saxagliptin HCl (ONGLYZA) 5 MG TABS tablet Take 5 mg by mouth daily.    spironolactone (ALDACTONE) 50 MG tablet Take 1 tablet by mouth daily.    !! warfarin (COUMADIN) 3 MG tablet Take 3 mg by mouth every evening. Pt takes on Monday ,wednesday, friday    !! warfarin (COUMADIN) 5 MG tablet Take 5 mg by mouth every evening. Tuesday , thursday, Saturday, sunday    fluticasone-salmeterol (ADVAIR HFA) 115-21 MCG/ACT inhaler Inhale 2 puffs into the lungs 2 (two) times daily. Qty: 1 Inhaler, Refills: 0    polyethylene glycol (MIRALAX / GLYCOLAX) packet Take 17 g by mouth daily. Qty: 30 each, Refills: 0     !! - Potential duplicate medications found. Please discuss with provider.    STOP taking these medications     lisinopril (PRINIVIL,ZESTRIL) 40 MG tablet      metoprolol succinate (TOPROL-XL) 25 MG 24 hr tablet      predniSONE (STERAPRED UNI-PAK 21 TAB) 10 MG (21) TBPK tablet          DISCHARGE INSTRUCTIONS:    The patient is to  follow-up with primary care physician within one week after discharge  If you experience worsening of your admission symptoms, develop shortness of breath, life threatening emergency, suicidal or homicidal thoughts you must seek medical attention immediately by calling 911 or calling your MD immediately  if symptoms less severe.  You Must read complete instructions/literature along with all the possible adverse reactions/side effects for all the Medicines you take and that have been prescribed to you. Take any new Medicines after you have completely understood and accept all the possible adverse reactions/side effects.   Please note  You were cared for by a hospitalist during your hospital stay. If you have any questions about your discharge medications or the care you received while you were in the hospital after you are discharged, you can call the unit and asked to speak with the hospitalist on call if the hospitalist that took care of you is not available. Once you are discharged, your primary care physician  will handle any further medical issues. Please note that NO REFILLS for any discharge medications will be authorized once you are discharged, as it is imperative that you return to your primary care physician (or establish a relationship with a primary care physician if you do not have one) for your aftercare needs so that they can reassess your need for medications and monitor your lab values.    Today   CHIEF COMPLAINT:   Chief Complaint  Patient presents with  . Back Pain  . Chest Pain    ribs hurt due to fall sunday  . Fall    HISTORY OF PRESENT ILLNESS:  Troy [name]  is a 82 y.o. male with a known history of fall, pain on the left side of the chest after fall. He also noted to be weak,  hypotensive. The patient had no shortness of breath,Was weaned off oxygen and O2 sats were 95-96% on room air. He was rehydrated and his blood pressure improved. Initial labs revealed acute on  chronic renal failure, creatinine level of 2.17, creatinine has improved to 1.09 today. Baseline creatinine was 1.2 in September 2017. It was felt that patient was dehydrated due to multiple diuretics. He was using for lower extremity swelling. Diuretic doses were decreased. Since patient\'s oral intake was fluctuating, blood pressure medications were stopped, to be restarted by primary care physician. The patient was evaluated by physical therapist and home health services were recommended. Patient was felt to be stable to be discharged home. Discussion by problem: #1. Acute on chronic renal failure, resolved with IV fluid administration, likely diuretic, hypotension related Bladder scan was done in the hospital, no urinary retention was noted. Urinalysis was unremarkable, no obvious urinary tract infection.  #2 hypotension, resolved, holding blood pressure medications, to be restarted as outpatient by primary care physician read #3 hypoxia, VQ scan was low probability for pulmonary embolism, patient was weaned off oxygen therapy and his O2 sats were 95% on room air. #4 generalized weakness, physical therapist recommended home health services #5. Dehydration, decrease diuretic dose #6. Leukocytosis, resolved on no antibiotic therapy, no signs of infection #7. Left chest wall contusion, Aspercreme topically, warm compresses    VITAL SIGNS:  Blood pressure 123/62, pulse 72, temperature 97.9 F (36.6 C), temperature source Oral, resp. rate 17, height 5\' 9.5" (1.765 m), weight 110.7 kg (244 lb), SpO2 96 %.  I/O:   Intake/Output Summary (Last 24 hours) at 03/21/16 1452 Last data filed at 03/21/16 1031  Gross per 24 hour  Intake              953 ml  Output              100 ml  Net              85 3 ml    PHYSICAL EXAMINATION:  GENERAL:  82 y.o.-year-old patient lying in the bed with no acute distress.  EYES: Pupils equal, round, reactive to light and accommodation. No scleral icterus.  Extraocular muscles intact.  HEENT: Head atraumatic, normocephalic. Oropharynx and nasopharynx clear.  NECK:  Supple, no jugular venous distention. No thyroid enlargement, no tenderness.  LUNGS: Normal breath sounds bilaterally, no wheezing, rales,rhonchi or crepitation. No use of accessory muscles of respiration.  CARDIOVASCULAR: S1, S2 normal. No murmurs, rubs, or gallops.  ABDOMEN: Soft, non-tender, non-distended. Bowel sounds present. No organomegaly or mass.  EXTREMITIES: No pedal edema, cyanosis, or clubbing.  NEUROLOGIC: Cranial nerves II through XII are intact. Muscle strength 5/5  in all extremities. Sensation intact. Gait not checked.  PSYCHIATRIC: The patient is alert and oriented x 3.  SKIN: No obvious rash, lesion, or ulcer.   DATA REVIEW:   CBC  Recent Labs Lab 03/20/16 0509  WBC 9.8  HGB 13.2  HCT 39.1*  PLT 144*    Chemistries   Recent Labs Lab 03/19/16 1213 03/20/16 0509 03/21/16 0627  NA 138 139  --   K 5.1 5.1 4.7  CL 106 108  --   CO2 26 27  --   GLUCOSE 177* 136*  --   BUN 61* 44*  --   CREATININE 2.17* 1.42* 1.09  CALCIUM 8.7* 8.4*  --   MG 2.0  --   --     Cardiac Enzymes No results for input(s): TROPONINI in the last 168 hours.  Microbiology Results  Results for orders placed or performed during the hospital encounter of 12/07/15  Culture, blood (routine x 2)     Status: None   Collection Time: 12/07/15  3:22 AM  Result Value Ref Range Status   Specimen Description BLOOD RIGHT ARM  Final   Special Requests BOTTLES DRAWN AEROBIC AND ANAEROBIC  Final   Culture NO GROWTH 5 DAYS  Final   Report Status 12/12/2015 FINAL  Final  Culture, blood (routine x 2)     Status: None   Collection Time: 12/07/15  3:22 AM  Result Value Ref Range Status   Specimen Description BLOOD LEFT ARM  Final   Special Requests BOTTLES DRAWN AEROBIC AND ANAEROBIC  Final   Culture NO GROWTH 5 DAYS  Final   Report Status 12/12/2015 FINAL  Final     RADIOLOGY:  Nm Pulmonary Perf And Vent  Result Date: 03/20/2016 CLINICAL DATA:  Chest pain.  Recent fall. EXAM: NUCLEAR MEDICINE VENTILATION - PERFUSION LUNG SCAN TECHNIQUE: Ventilation images were obtained in multiple projections using inhaled aerosol Tc-54m DTPA. Perfusion images were obtained in multiple projections after intravenous injection of Tc-25m MAA. RADIOPHARMACEUTICALS:  32.8 mCi Technetium-19m DTPA aerosol inhalation and 4.1 mCi Technetium-35m MAA IV COMPARISON:  Chest CT 03/19/2016 FINDINGS: Ventilation: Multiple defects noted in the left lung. Central deposition of the radiopharmaceutical noted bilaterally. Perfusion: Matching defects in the left lung. No mismatched defects to suggest pulmonary embolism. IMPRESSION: Low probability V/Q scan for pulmonary embolism. Electronically Signed   By: Rudie Meyer M.D.   On: 03/20/2016 19:28    EKG:   Orders placed or performed during the hospital encounter of 03/19/16  . EKG 12-Lead  . EKG 12-Lead  . EKG 12-Lead  . EKG 12-Lead      Management plans discussed with the patient, family and they are in agreement.  CODE STATUS:     Code Status Orders        Start     Ordered   03/19/16 1655  Do not attempt resuscitation (DNR)  Continuous    Question Answer Comment  In the event of cardiac or respiratory ARREST Do not call a "code blue"   In the event of cardiac or respiratory ARREST Do not perform Intubation, CPR, defibrillation or ACLS   In the event of cardiac or respiratory ARREST Use medication by any route, position, wound care, and other measures to relive pain and suffering. May use oxygen, suction and manual treatment of airway obstruction as needed for comfort.      03/19/16 1654    Code Status History    Date Active Date Inactive Code Status Order  ID Comments User Context   12/14/2015 12:09 PM 12/16/2015  7:58 PM DNR [id number]  Altamese DillingVaibhavkumar Vachhani, MD Inpatient   12/13/2015  8:55 PM 12/14/2015 12:09 PM Full  Code [id number]  Tonye RoyaltyAlexis Hugelmeyer, DO ED   12/13/2015  8:50 PM 12/13/2015  8:55 PM DNR [id number]  Tonye RoyaltyAlexis Hugelmeyer, DO ED   12/07/2015  6:27 AM 12/11/2015  5:06 PM DNR [id number]  Ihor AustinPavan Pyreddy, MD ED   09/25/2015  1:18 AM 09/28/2015  8:53 PM DNR [id number]  Oralia Manisavid Willis, MD Inpatient   08/07/2015  7:50 PM 08/09/2015  3:23 PM DNR [id number]  Darrol Jumphonda G Barrett, PA-C Inpatient   08/03/2015  4:58 PM 08/07/2015  7:50 PM DNR [id number]  Houston SirenVivek J Sainani, MD ED   02/25/2015  7:56 PM 03/01/2015  7:21 PM Full Code [id number]  Gale Journeyatherine P Walsh, MD ED    Advance Directive Documentation   Flowsheet Row Most Recent Value  Type of Advance Directive  Living will, Healthcare Power of Attorney  Pre-existing out of facility DNR order (yellow form or pink MOST form)  No data  "MOST" Form in Place?  No data      TOTAL TIME TAKING CARE OF THIS PATIENT: 40 minutes.    Katharina CaperVAICKUTE,Joline Encalada M.D on 03/21/2016 at 2:52 PM  Between 7am to 6pm - Pager - [phone]  After 6pm go to [email or web address] - password [email or web address] [facility]  [facility]  Office  [phone]  CC: Primary care physician; Barbette ReichmannHANDE,VISHWANATH, MD

## 2016-03-21 NOTE — Care Management (Signed)
Patient admitted from home with AR[facility].  Patient lives at home with his wife.  Had private pay care giver Monday-Friday 8am-4pm.  Patient has RW, WC, cane, hospital bed, elevated toliet seat, shower chair, and chronic o2 through [facility].  PT has assessed patient and recommended home health PT.  Patient has had [facility] previously and would like be referred to them again.  Referral Made to Clydie BraunKaren with hospice.  RNCM signing off

## 2016-03-21 NOTE — Evaluation (Signed)
Physical Therapy Evaluation Patient Details Name: Troy BurnRobert [name] MRN: [id number] DOB: Jul 12, 1923 Today's Date: 03/21/2016   History of Present Illness  Pt is 82 year old male who presents to the hospital with complaints of fall, pain on the left side of the chest after fall. He also noted to be weak. In the hospital he was noted to be hypotensive. The patient had no shortness of breath but was on 2 L of oxygen nasal cannula. He was rehydrated and his blood pressure improved. Initial labs revealed acute and chronic renal failure, creatinine level of 2.17, creatinine has improved to 1.42.  Clinical Impression  Pt presents with deficits in strength, transfers, mobility, gait, balance, and activity tolerance.  Pt required min A with bed mobility, CGA with transfers with verbal cues for sequencing, and CGA with amb 30' with RW.  Baseline vitals in supine: BP 123/62 mmHg, SpO2 95%, HR 78 bpm; seated: BP 131/70 mmHg, SpO2 97%, HR 82 bpm; after amb: HR 86 bpm, SpO2 96% with limited SOB.  Pt will benefit from PT services to address above deficits for decreased caregiver assistance upon discharge.      Follow Up Recommendations Home health PT    Equipment Recommendations  None recommended by PT    Recommendations for Other Services       Precautions / Restrictions Precautions Precautions: Fall Restrictions Weight Bearing Restrictions: No      Mobility  Bed Mobility Overal bed mobility: Needs Assistance Bed Mobility: Sit to Supine;Supine to Sit     Supine to sit: Min assist Sit to supine: Min assist   General bed mobility comments: Min A to scoot forward for seated positioning at EOB  Transfers Overall transfer level: Needs assistance Equipment used: Rolling walker (2 wheeled) Transfers: Sit to/from Stand Sit to Stand: Min guard         General transfer comment: Mod verbal cues for sequencing  Ambulation/Gait Ambulation/Gait assistance: Min guard Ambulation Distance  (Feet): 30 Feet Assistive device: Rolling walker (2 wheeled) Gait Pattern/deviations: Step-through pattern;Trunk flexed;Decreased step length - right;Decreased step length - left   Gait velocity interpretation: Below normal speed for age/gender General Gait Details: Slow cadence with gait but steady without LOB  Stairs Stairs:  (deferred)          Wheelchair Mobility    Modified Rankin (Stroke Patients Only)       Balance Overall balance assessment: Needs assistance Sitting-balance support: Bilateral upper extremity supported Sitting balance-Leahy Scale: Fair     Standing balance support: Bilateral upper extremity supported Standing balance-Leahy Scale: Fair                               Pertinent Vitals/Pain Pain Assessment: No/denies pain    Home Living Family/patient expects to be discharged to:: Private residence Living Arrangements: Spouse/significant other Available Help at Discharge: Family;Personal care attendant;Available PRN/intermittently (Personal care attendant M-F from 8 AM to 4 PM) Type of Home: House Home Access: Stairs to enter Entrance Stairs-Rails: Left Entrance Stairs-Number of Steps: 3 Home Layout: One level Home Equipment: Hospital bed;Walker - 2 wheels;Wheelchair - [employer] Corporationmanual;Walker - 4 wheels;Cane - quad      Prior Function Level of Independence: Needs assistance   Gait / Transfers Assistance Needed: Pt SBA with amb household distances using a QC or a RW and used a w/c in the community, current fall was only fall in last year.  ADL's / Homemaking Assistance Needed: Personal caregiver assisted with  ADLs        Hand Dominance        Extremity/Trunk Assessment               Lower Extremity Assessment: Generalized weakness         Communication   Communication: No difficulties  Cognition Arousal/Alertness: Awake/alert Behavior During Therapy: WFL for tasks assessed/performed Overall Cognitive Status: Within  Functional Limits for tasks assessed                      General Comments      Exercises Total Joint Exercises Ankle Circles/Pumps: Strengthening;Both;10 reps Towel Squeeze: Strengthening;Both;10 reps Long Arc Quad: Strengthening;Both;10 reps Knee Flexion: Strengthening;Both;10 reps Marching in Standing: AROM;Both;10 reps Other Exercises Other Exercises: Static standing balance training with feet apart and eyes open/closed, head still/head turns with min instability but no assistance required to prevent LOB   Assessment/Plan    PT Assessment Patient needs continued PT services  PT Problem List Decreased strength;Decreased activity tolerance;Decreased balance;Decreased mobility          PT Treatment Interventions DME instruction;Gait training;Stair training;Neuromuscular re-education;Functional mobility training;Balance training;Therapeutic exercise;Therapeutic activities;Patient/family education    PT Goals (Current goals can be found in the Care Plan section)  Acute Rehab PT Goals Patient Stated Goal: To return home PT Goal Formulation: With patient Time For Goal Achievement: 04/03/16 Potential to Achieve Goals: Good    Frequency Min 2X/week   Barriers to discharge        Co-evaluation               End of Session Equipment Utilized During Treatment: Gait belt Activity Tolerance: Patient tolerated treatment well Patient left: in chair;with chair alarm set;with call bell/phone within reach;with family/visitor present           Time: [id number] PT Time Calculation (min) (ACUTE ONLY): 41 min   Charges:   PT Evaluation $PT Eval Low Complexity: 1 Procedure PT Treatments $Therapeutic Exercise: 8-22 mins   PT G Codes:        Elly Modena. Scott Malesha Suliman PT, DPT 03/21/16, 11:38 AM

## 2016-03-21 NOTE — Care Management Important Message (Signed)
Important Message  Patient Details  Name: Troy BurnRobert [name] MRN: [id number] Date of Birth: [birth year]   Medicare Important Message Given:  Yes    Chapman FitchBOWEN, Tyreke Kaeser T, RN 03/21/2016, 9:56 AM

## 2016-03-21 NOTE — Progress Notes (Signed)
Inpatient Diabetes Program Recommendations  AACE/ADA: New Consensus Statement on Inpatient Glycemic Control (2015)  Target Ranges:  Prepandial:   less than 140 mg/dL      Peak postprandial:   less than 180 mg/dL (1-2 hours)      Critically ill patients:  140 - 180 mg/dL   Lab Results  Component Value Date   GLUCAP 219 (H) 03/21/2016   HGBA1C 9.1 (H) 03/20/2016    Review of Glycemic Control  Results for Troy [name], Maxson S (MRN [id number]) as of 03/21/2016 10:44  Ref. Range 03/20/2016 11:45 03/20/2016 17:40 03/20/2016 21:03 03/21/2016 07:35 03/21/2016 09:19  Glucose-Capillary Latest Ref Range: 65 - 99 mg/dL 409170 (H) 811199 (H) 914206 (H) 119 (H) 219 (H)    Diabetes history: Type 2, A1C pending Outpatient Diabetes medications: from med reconciliation;  Glipizide 5mg  qday, Lantus 10 units qhs, Humulin R U500 5 units bid (breakfast and supper)  Current orders for Inpatient glycemic control: Lantus 10 units qhs, Novolog 5 units bid, Novolog 0-9 units tid, Novolog 0-5 units qhs    * Prednisone 5mg  daily  Inpatient Diabetes Program Recommendations:   Agree with current medications for blood sugar management.   Susette RacerJulie Pavlos Yon, RN, BA, MHA, CDE Diabetes Coordinator Inpatient Diabetes Program  [phone] (Team Pager) [phone] [facility] Office) 03/21/2016 10:54 AM

## 2016-03-21 NOTE — Progress Notes (Signed)
[facility] Bow[facility]. [name] is a 82 year old [facility] following a fall at home, he had complaints of rib pain. He was found to be hypotensive and dehydrated. He has been treated with IV fluids and his diuretics were held. He has remained weak and Physical therapy has recommended home health physical therapy, attending physician and also requested skilled nursing services. [facility] information given to patient's wife Rubin Payordith, present in the room. Patient lives with his wife and has a caregiver present as well. Plan is for discharge home today via car. No DME needs at this time patient has oxygen in place in the home. Patient information faxed to referral. Thank you for the opportunity to be involved in the care of this patient.  Dayna BarkerKaren Robertson RN, BSN, [facility],  [facility] Liaison [phone]

## 2016-03-21 NOTE — Progress Notes (Signed)
ANTICOAGULATION CONSULT NOTE - Initial Consult  Pharmacy Consult for warfarin Indication: atrial fibrillation  No Known Allergies  Patient Measurements: Height: 5' 9.5" (176.5 cm) Weight: 244 lb (110.7 kg) IBW/kg (Calculated) : 71.85  Vital Signs: Temp: 98.1 F (36.7 C) (11/17 0916) Temp Source: Oral (11/17 0916) BP: 123/62 (11/17 1122) Pulse Rate: 78 (11/17 1122)  Labs:  Recent Labs  03/19/16 1213 03/20/16 0509 03/21/16 0627  HGB 13.9 13.2  --   HCT 41.8 39.1*  --   PLT 165 144*  --   LABPROT 34.8* 34.1* 23.6*  INR 3.36 3.28 2.07  CREATININE 2.17* 1.42* 1.09  CKTOTAL  --  42*  --     Estimated Creatinine Clearance: 55.7 mL/min (by C-G formula based on SCr of 1.09 mg/dL).  Assessment: Pharmacy consulted to dose and monitor warfarin in this 82 year old male who was taking warfarin prior to admission for atrial fibrillation.   Home dose is warfarin 3 mg PO daily on MWF and 5 mg PO Daily on TTSS.   Dosing history: Date INR Dose 11/15 3.36 Held 11/16 3.28 Held 11/17 2.07  Goal of Therapy:  INR 2-3 Monitor platelets by anticoagulation protocol: Yes   Plan:  INR = 2.07 is therapeutic but will likely decrease tomorrow as a result of warfarin being held for 2 days.  Will give warfarin 4 mg PO dose tonight at 1800 and anticipate being able to resume home dose.  Cindi CarbonMary M Carolena Fairbank, PharmD 03/21/2016,2:19 PM

## 2016-03-21 NOTE — Care Management (Signed)
PT at bedside for assessment.  Will attempt assessment at later time

## 2016-05-09 ENCOUNTER — Emergency Department: Payer: Medicare Other

## 2016-05-09 ENCOUNTER — Encounter: Payer: Self-pay | Admitting: Emergency Medicine

## 2016-05-09 ENCOUNTER — Inpatient Hospital Stay
Admission: EM | Admit: 2016-05-09 | Discharge: 2016-05-13 | DRG: 190 | Disposition: A | Discharge status: Skilled Nursing Facility | Payer: Medicare Other | Attending: Internal Medicine | Admitting: Internal Medicine

## 2016-05-09 DIAGNOSIS — I482 Chronic atrial fibrillation: Secondary | ICD-10-CM | POA: Diagnosis present

## 2016-05-09 DIAGNOSIS — Z87891 Personal history of nicotine dependence: Secondary | ICD-10-CM

## 2016-05-09 DIAGNOSIS — G9341 Metabolic encephalopathy: Secondary | ICD-10-CM | POA: Diagnosis present

## 2016-05-09 DIAGNOSIS — Z952 Presence of prosthetic heart valve: Secondary | ICD-10-CM

## 2016-05-09 DIAGNOSIS — J189 Pneumonia, unspecified organism: Secondary | ICD-10-CM | POA: Diagnosis present

## 2016-05-09 DIAGNOSIS — J9620 Acute and chronic respiratory failure, unspecified whether with hypoxia or hypercapnia: Secondary | ICD-10-CM | POA: Diagnosis present

## 2016-05-09 DIAGNOSIS — I5022 Chronic systolic (congestive) heart failure: Secondary | ICD-10-CM | POA: Diagnosis present

## 2016-05-09 DIAGNOSIS — R4182 Altered mental status, unspecified: Secondary | ICD-10-CM | POA: Diagnosis not present

## 2016-05-09 DIAGNOSIS — Z8673 Personal history of transient ischemic attack (TIA), and cerebral infarction without residual deficits: Secondary | ICD-10-CM

## 2016-05-09 DIAGNOSIS — H548 Legal blindness, as defined in USA: Secondary | ICD-10-CM | POA: Diagnosis present

## 2016-05-09 DIAGNOSIS — Z79899 Other long term (current) drug therapy: Secondary | ICD-10-CM

## 2016-05-09 DIAGNOSIS — E1165 Type 2 diabetes mellitus with hyperglycemia: Secondary | ICD-10-CM | POA: Diagnosis present

## 2016-05-09 DIAGNOSIS — R140 Abdominal distension (gaseous): Secondary | ICD-10-CM

## 2016-05-09 DIAGNOSIS — E860 Dehydration: Secondary | ICD-10-CM | POA: Diagnosis present

## 2016-05-09 DIAGNOSIS — Z7901 Long term (current) use of anticoagulants: Secondary | ICD-10-CM

## 2016-05-09 DIAGNOSIS — T380X5A Adverse effect of glucocorticoids and synthetic analogues, initial encounter: Secondary | ICD-10-CM | POA: Diagnosis present

## 2016-05-09 DIAGNOSIS — J440 Chronic obstructive pulmonary disease with acute lower respiratory infection: Secondary | ICD-10-CM | POA: Diagnosis not present

## 2016-05-09 DIAGNOSIS — N179 Acute kidney failure, unspecified: Secondary | ICD-10-CM | POA: Diagnosis present

## 2016-05-09 DIAGNOSIS — N183 Chronic kidney disease, stage 3 (moderate): Secondary | ICD-10-CM | POA: Diagnosis present

## 2016-05-09 DIAGNOSIS — Z794 Long term (current) use of insulin: Secondary | ICD-10-CM

## 2016-05-09 DIAGNOSIS — Z66 Do not resuscitate: Secondary | ICD-10-CM | POA: Diagnosis present

## 2016-05-09 DIAGNOSIS — Z8249 Family history of ischemic heart disease and other diseases of the circulatory system: Secondary | ICD-10-CM | POA: Diagnosis not present

## 2016-05-09 DIAGNOSIS — Z950 Presence of cardiac pacemaker: Secondary | ICD-10-CM

## 2016-05-09 DIAGNOSIS — I130 Hypertensive heart and chronic kidney disease with heart failure and stage 1 through stage 4 chronic kidney disease, or unspecified chronic kidney disease: Secondary | ICD-10-CM | POA: Diagnosis present

## 2016-05-09 DIAGNOSIS — J9621 Acute and chronic respiratory failure with hypoxia: Secondary | ICD-10-CM | POA: Diagnosis present

## 2016-05-09 DIAGNOSIS — E1122 Type 2 diabetes mellitus with diabetic chronic kidney disease: Secondary | ICD-10-CM | POA: Diagnosis present

## 2016-05-09 DIAGNOSIS — R778 Other specified abnormalities of plasma proteins: Secondary | ICD-10-CM | POA: Diagnosis present

## 2016-05-09 DIAGNOSIS — Z9981 Dependence on supplemental oxygen: Secondary | ICD-10-CM

## 2016-05-09 DIAGNOSIS — R262 Difficulty in walking, not elsewhere classified: Secondary | ICD-10-CM

## 2016-05-09 DIAGNOSIS — Z823 Family history of stroke: Secondary | ICD-10-CM

## 2016-05-09 DIAGNOSIS — G20 Parkinson's disease: Secondary | ICD-10-CM | POA: Diagnosis present

## 2016-05-09 DIAGNOSIS — M6281 Muscle weakness (generalized): Secondary | ICD-10-CM

## 2016-05-09 LAB — URINALYSIS, COMPLETE (UACMP) WITH MICROSCOPIC
Bacteria, UA: NONE SEEN
Bilirubin Urine: NEGATIVE
Glucose, UA: >=500 mg/dL — AB
Ketones, ur: NEGATIVE mg/dL
Leukocytes, UA: NEGATIVE
Nitrite: NEGATIVE
Protein, ur: NEGATIVE mg/dL
Specific Gravity, Urine: 1.009 (ref 1.005–1.030)
Squamous Epithelial / HPF: NONE SEEN
pH: 5 (ref 5.0–8.0)

## 2016-05-09 LAB — LIPASE, BLOOD: Lipase: 33 U/L (ref 11–51)

## 2016-05-09 LAB — COMPREHENSIVE METABOLIC PANEL
ALBUMIN: 3.6 g/dL (ref 3.5–5.0)
ALK PHOS: 69 U/L (ref 38–126)
ALT: 6 U/L — ABNORMAL LOW (ref 17–63)
ANION GAP: 3 — AB (ref 5–15)
AST: 21 U/L (ref 15–41)
BUN: 21 mg/dL — ABNORMAL HIGH (ref 6–20)
CALCIUM: 8.9 mg/dL (ref 8.9–10.3)
CHLORIDE: 101 mmol/L (ref 101–111)
CO2: 31 mmol/L (ref 22–32)
Creatinine, Ser: 1.21 mg/dL (ref 0.61–1.24)
GFR calc non Af Amer: 51 mL/min — ABNORMAL LOW (ref >60)
GFR, EST AFRICAN AMERICAN: 59 mL/min — AB (ref >60)
GLUCOSE: 395 mg/dL — AB (ref 65–99)
POTASSIUM: 4.2 mmol/L (ref 3.5–5.1)
SODIUM: 135 mmol/L (ref 135–145)
Total Bilirubin: 0.9 mg/dL (ref 0.3–1.2)
Total Protein: 6.7 g/dL (ref 6.5–8.1)

## 2016-05-09 LAB — BLOOD GAS, ARTERIAL
ACID-BASE EXCESS: 6.8 mmol/L — AB (ref 0.0–2.0)
BICARBONATE: 32.5 mmol/L — AB (ref 20.0–28.0)
FIO2: 0.28
O2 Saturation: 92 %
PCO2 ART: 49 mmHg — AB (ref 32.0–48.0)
PH ART: 7.43 (ref 7.350–7.450)
Patient temperature: 37
pO2, Arterial: 62 mmHg — ABNORMAL LOW (ref 83.0–108.0)

## 2016-05-09 LAB — CBC WITH DIFFERENTIAL/PLATELET
Basophils Absolute: 0 10*3/uL (ref 0–0.1)
Basophils Relative: 0 %
Eosinophils Absolute: 0.1 10*3/uL (ref 0–0.7)
Eosinophils Relative: 1 %
HCT: 40.3 % (ref 40.0–52.0)
Hemoglobin: 13.5 g/dL (ref 13.0–18.0)
Lymphocytes Relative: 9 %
Lymphs Abs: 0.8 10*3/uL — ABNORMAL LOW (ref 1.0–3.6)
MCH: 30.8 pg (ref 26.0–34.0)
MCHC: 33.6 g/dL (ref 32.0–36.0)
MCV: 91.9 fL (ref 80.0–100.0)
Monocytes Absolute: 0.8 10*3/uL (ref 0.2–1.0)
Monocytes Relative: 10 %
Neutro Abs: 6.9 10*3/uL — ABNORMAL HIGH (ref 1.4–6.5)
Neutrophils Relative %: 80 %
Platelets: 147 10*3/uL — ABNORMAL LOW (ref 150–440)
RBC: 4.39 MIL/uL — ABNORMAL LOW (ref 4.40–5.90)
RDW: 15.3 % — ABNORMAL HIGH (ref 11.5–14.5)
WBC: 8.6 10*3/uL (ref 3.8–10.6)

## 2016-05-09 LAB — PROTIME-INR
INR: 3.26
Prothrombin Time: 34 seconds — ABNORMAL HIGH (ref 11.4–15.2)

## 2016-05-09 LAB — TSH: TSH: 1.179 u[IU]/mL (ref 0.350–4.500)

## 2016-05-09 LAB — TROPONIN I: Troponin I: 0.03 ng/mL

## 2016-05-09 LAB — MRSA PCR SCREENING

## 2016-05-09 LAB — GLUCOSE, CAPILLARY: Glucose-Capillary: 313 mg/dL — ABNORMAL HIGH (ref 65–99)

## 2016-05-09 MED ORDER — LISINOPRIL 10 MG PO TABS
10.0000 mg | ORAL_TABLET | Freq: Every day | ORAL | Status: DC
Start: 2016-05-09 — End: 2016-05-13
  Administered 2016-05-09 – 2016-05-13 (×5): 10 mg via ORAL
  Filled 2016-05-09 (×5): qty 1

## 2016-05-09 MED ORDER — ACETAMINOPHEN 650 MG RE SUPP: 650.0000 mg | Freq: Four times a day (QID) | RECTAL | Status: DC | PRN

## 2016-05-09 MED ORDER — WARFARIN - PHARMACIST DOSING INPATIENT
Freq: Every day | Status: DC
  Administered 2016-05-11 – 2016-05-12 (×2)

## 2016-05-09 MED ORDER — DEXTROSE 5 % IV SOLN: 1.0000 g | Freq: Once | INTRAVENOUS | Status: DC

## 2016-05-09 MED ORDER — CARBIDOPA-LEVODOPA 25-250 MG PO TABS
1.0000 | ORAL_TABLET | Freq: Three times a day (TID) | ORAL | Status: DC
  Administered 2016-05-09 – 2016-05-13 (×11): 1 via ORAL
  Filled 2016-05-09 (×12): qty 1

## 2016-05-09 MED ORDER — ONDANSETRON HCL 4 MG/2ML IJ SOLN: 4.0000 mg | Freq: Four times a day (QID) | INTRAMUSCULAR | Status: DC | PRN

## 2016-05-09 MED ORDER — GLIPIZIDE ER 5 MG PO TB24
5.0000 mg | ORAL_TABLET | Freq: Every day | ORAL | Status: DC
  Administered 2016-05-10 – 2016-05-13 (×4): 5 mg via ORAL
  Filled 2016-05-09 (×4): qty 1

## 2016-05-09 MED ORDER — LINAGLIPTIN 5 MG PO TABS
5.0000 mg | ORAL_TABLET | Freq: Every day | ORAL | Status: DC
  Administered 2016-05-10 – 2016-05-13 (×4): 5 mg via ORAL
  Filled 2016-05-09 (×4): qty 1

## 2016-05-09 MED ORDER — HYDROCODONE-ACETAMINOPHEN 5-325 MG PO TABS: 1.0000 | ORAL_TABLET | ORAL | Status: DC | PRN

## 2016-05-09 MED ORDER — ACETAMINOPHEN 325 MG PO TABS
650.0000 mg | ORAL_TABLET | Freq: Four times a day (QID) | ORAL | Status: DC | PRN
  Administered 2016-05-12: 650 mg via ORAL
  Filled 2016-05-09: qty 2

## 2016-05-09 MED ORDER — METHYLPREDNISOLONE SODIUM SUCC 125 MG IJ SOLR
125.0000 mg | Freq: Once | INTRAMUSCULAR | Status: AC
  Administered 2016-05-09: 125 mg via INTRAVENOUS
  Filled 2016-05-09: qty 2

## 2016-05-09 MED ORDER — METOPROLOL SUCCINATE ER 25 MG PO TB24
25.0000 mg | ORAL_TABLET | Freq: Every day | ORAL | Status: DC
  Administered 2016-05-09 – 2016-05-13 (×5): 25 mg via ORAL
  Filled 2016-05-09: qty 1
  Filled 2016-05-09: qty 0.5
  Filled 2016-05-09 (×4): qty 1

## 2016-05-09 MED ORDER — VANCOMYCIN HCL 10 G IV SOLR
1250.0000 mg | INTRAVENOUS | Status: DC
  Administered 2016-05-10 (×2): 1250 mg via INTRAVENOUS
  Filled 2016-05-09 (×3): qty 1250

## 2016-05-09 MED ORDER — HEPARIN SODIUM (PORCINE) 5000 UNIT/ML IJ SOLN: 5000.0000 [IU] | Freq: Three times a day (TID) | INTRAMUSCULAR | Status: DC

## 2016-05-09 MED ORDER — CEFTRIAXONE SODIUM-DEXTROSE 1-3.74 GM-% IV SOLR: 1.0000 g | INTRAVENOUS | Status: DC

## 2016-05-09 MED ORDER — WARFARIN SODIUM 5 MG PO TABS: 5.0000 mg | ORAL_TABLET | Freq: Every evening | ORAL | Status: DC

## 2016-05-09 MED ORDER — VANCOMYCIN HCL 10 G IV SOLR
1250.0000 mg | Freq: Once | INTRAVENOUS | Status: AC
  Administered 2016-05-09: 18:00:00 1250 mg via INTRAVENOUS
  Filled 2016-05-09: qty 1250

## 2016-05-09 MED ORDER — FUROSEMIDE 40 MG PO TABS
40.0000 mg | ORAL_TABLET | Freq: Every day | ORAL | Status: DC
  Administered 2016-05-10 – 2016-05-13 (×4): 40 mg via ORAL
  Filled 2016-05-09 (×4): qty 1

## 2016-05-09 MED ORDER — INSULIN GLARGINE 100 UNIT/ML ~~LOC~~ SOLN
24.0000 [IU] | Freq: Every day | SUBCUTANEOUS | Status: DC
  Administered 2016-05-09 – 2016-05-11 (×3): 24 [IU] via SUBCUTANEOUS
  Filled 2016-05-09 (×3): qty 0.24

## 2016-05-09 MED ORDER — WARFARIN SODIUM 3 MG PO TABS: 3.0000 mg | ORAL_TABLET | Freq: Every evening | ORAL | Status: DC

## 2016-05-09 MED ORDER — ALBUTEROL SULFATE (2.5 MG/3ML) 0.083% IN NEBU
7.5000 mg | INHALATION_SOLUTION | Freq: Once | RESPIRATORY_TRACT | Status: AC
  Administered 2016-05-09: 7.5 mg via RESPIRATORY_TRACT
  Filled 2016-05-09: qty 9

## 2016-05-09 MED ORDER — MOMETASONE FURO-FORMOTEROL FUM 200-5 MCG/ACT IN AERO
2.0000 | INHALATION_SPRAY | Freq: Two times a day (BID) | RESPIRATORY_TRACT | Status: DC
  Administered 2016-05-09 – 2016-05-13 (×8): 2 via RESPIRATORY_TRACT
  Filled 2016-05-09: qty 8.8

## 2016-05-09 MED ORDER — POTASSIUM CHLORIDE CRYS ER 20 MEQ PO TBCR
10.0000 meq | EXTENDED_RELEASE_TABLET | Freq: Every day | ORAL | Status: DC
  Administered 2016-05-09 – 2016-05-13 (×5): 10 meq via ORAL
  Filled 2016-05-09 (×5): qty 1

## 2016-05-09 MED ORDER — ALBUTEROL SULFATE (2.5 MG/3ML) 0.083% IN NEBU
2.5000 mg | INHALATION_SOLUTION | Freq: Three times a day (TID) | RESPIRATORY_TRACT | Status: DC
  Administered 2016-05-09 – 2016-05-11 (×5): 2.5 mg via RESPIRATORY_TRACT
  Filled 2016-05-09 (×6): qty 3

## 2016-05-09 MED ORDER — ALBUTEROL SULFATE (2.5 MG/3ML) 0.083% IN NEBU: 2.5000 mg | INHALATION_SOLUTION | RESPIRATORY_TRACT | Status: DC | PRN

## 2016-05-09 MED ORDER — WARFARIN 0.5 MG HALF TABLET
1.5000 mg | ORAL_TABLET | Freq: Once | ORAL | Status: AC
  Administered 2016-05-09: 1.5 mg via ORAL
  Filled 2016-05-09: qty 1

## 2016-05-09 MED ORDER — POLYETHYLENE GLYCOL 3350 17 G PO PACK
17.0000 g | PACK | Freq: Every day | ORAL | Status: DC
  Administered 2016-05-10 – 2016-05-13 (×4): 17 g via ORAL
  Filled 2016-05-09 (×4): qty 1

## 2016-05-09 MED ORDER — CEFTRIAXONE SODIUM-DEXTROSE 1-3.74 GM-% IV SOLR
1.0000 g | Freq: Once | INTRAVENOUS | Status: AC
  Administered 2016-05-09: 1 g via INTRAVENOUS
  Filled 2016-05-09: qty 50

## 2016-05-09 MED ORDER — TRAZODONE HCL 50 MG PO TABS: 25.0000 mg | ORAL_TABLET | Freq: Every evening | ORAL | Status: DC | PRN

## 2016-05-09 MED ORDER — LEVOFLOXACIN IN D5W 500 MG/100ML IV SOLN
500.0000 mg | INTRAVENOUS | Status: DC
  Filled 2016-05-09: qty 100

## 2016-05-09 MED ORDER — ONDANSETRON HCL 4 MG PO TABS: 4.0000 mg | ORAL_TABLET | Freq: Four times a day (QID) | ORAL | Status: DC | PRN

## 2016-05-09 MED ORDER — DOCUSATE SODIUM 100 MG PO CAPS
100.0000 mg | ORAL_CAPSULE | Freq: Two times a day (BID) | ORAL | Status: DC
  Administered 2016-05-09 – 2016-05-13 (×6): 100 mg via ORAL
  Filled 2016-05-09 (×8): qty 1

## 2016-05-09 MED ORDER — METHYLPREDNISOLONE SODIUM SUCC 125 MG IJ SOLR
60.0000 mg | INTRAMUSCULAR | Status: DC
  Administered 2016-05-10 – 2016-05-12 (×3): 60 mg via INTRAVENOUS
  Filled 2016-05-09 (×3): qty 2

## 2016-05-09 MED ORDER — BISACODYL 5 MG PO TBEC: 5.0000 mg | DELAYED_RELEASE_TABLET | Freq: Every day | ORAL | Status: DC | PRN

## 2016-05-09 MED ORDER — DEXTROSE 5 % IV SOLN
500.0000 mg | Freq: Once | INTRAVENOUS | Status: AC
  Administered 2016-05-09: 500 mg via INTRAVENOUS
  Filled 2016-05-09: qty 500

## 2016-05-09 NOTE — ED Triage Notes (Signed)
Pt presents from home via EMS c/o "not acting right" and lethargic.

## 2016-05-09 NOTE — H&P (Signed)
[facility] [place] at [facility]   PATIENT NAME: Troy [name]    MR#:  [id number]  DATE OF BIRTH:  [birth year]  DATE OF ADMISSION:  05/09/2016  PRIMARY CARE PHYSICIAN: Barbette Reichmann, MD   REQUESTING/REFERRING PHYSICIAN: Dr. Arlean Hopping  CHIEF COMPLAINT:  altered mental status and weakness    Chief Complaint  Patient presents with  . Weakness  . Altered Mental Status    HISTORY OF PRESENT ILLNESS:  Troy [name]  is a 83 y.o. male with a known history of COPD, diabetes mellitus type 2, chronic atrial fibrillation brought in by family because of altered mental status. According to the patient's caretaker patient was awake all last night having to urinate, found to have hypoxia saturation 70% on 2 L of oxygen when EMS arrived. And patient uses 2 L at home. According to family patient ambulates and also does exercise and eats regular food. And noted to have confusion this morning and has some cough. Patient found to have hypoxia, bilateral pneumonia.  PAST MEDICAL HISTORY:   Past Medical History:  Diagnosis Date  . Chronic atrial fibrillation (HCC)    a. on Couamdin; b. CHADS2VASc at least 7 (CHF, HTN, age x 2, DM, stroke x 2); c. s/p MAZE 1999  . Chronic systolic CHF (congestive heart failure) (HCC)    a. echo 08/01/2015: EF of 45%, mild LVH, mitral valve with annular calcification, mitral valve partially mobile  . CKD (chronic kidney disease), stage III   . COPD (chronic obstructive pulmonary disease) (HCC)   . Diabetes mellitus without complication (HCC)   . Hypertension   . Hypothyroidism   . Legally blind   . Mitral valve prolapse    a. s/p mitral valve repair 1999  . Normal coronary arteries    a. by cardiac cath 1999  . Parkinson's disease (HCC)   . Stroke (HCC)   . TIA (transient ischemic attack)   . UTI (lower urinary tract infection)     PAST SURGICAL HISTOIRY:   Past Surgical History:  Procedure Laterality Date  . CARDIAC  CATHETERIZATION N/A 08/08/2015   Procedure: Temporary Pacemaker;  Surgeon: Peter M Swaziland, MD;  Location: [facility] INVASIVE CV LAB;  Service: Cardiovascular;  Laterality: N/A;  . CARDIAC SURGERY    . CHOLECYSTECTOMY    . EP IMPLANTABLE DEVICE N/A 08/08/2015   Procedure: Pacemaker Implant;  Surgeon: Will Jorja Loa, MD;  Location: [facility] INVASIVE CV LAB;  Service: Cardiovascular;  Laterality: N/A;    SOCIAL HISTORY:   Social History  Substance Use Topics  . Smoking status: Former Smoker    Packs/day: 4.00    Years: 20.00    Types: Cigarettes  . Smokeless tobacco: Never Used  . Alcohol use No    FAMILY HISTORY:   Family History  Problem Relation Age of Onset  . Hypertension Mother   . Stroke Mother   . CAD Father   . Lymphoma Sister   . Lung disease Brother     DRUG ALLERGIES:  No Known Allergies  REVIEW OF SYSTEMS:  CONSTITUTIONAL: No fever, fatigue or weakness.  EYES: No blurred or double vision.  EARS, NOSE, AND THROAT: No tinnitus or ear pain.  RESPIRATORY:Cough, shortness of breath hypoxia CARDIOVASCULAR: No chest pain, orthopnea, edema.  GASTROINTESTINAL: No nausea, vomiting, diarrhea or abdominal pain.  GENITOURINARY: No dysuria, hematuria.  ENDOCRINE: No polyuria, nocturia,  HEMATOLOGY: No anemia, easy bruising or bleeding SKIN: No rash or lesion. MUSCULOSKELETAL: No joint pain or arthritis.   NEUROLOGIC:  No tingling, numbness, weakness.  PSYCHIATRY: No anxiety or depression.   MEDICATIONS AT HOME:   Prior to Admission medications   Medication Sig Start Date End Date Taking? Authorizing Provider  albuterol (PROVENTIL HFA) 108 (90 Base) MCG/ACT inhaler Inhale 2 puffs into the lungs every 4 (four) hours as needed for wheezing or shortness of breath. 07/11/15  Yes Sharman Cheek, MD  albuterol (PROVENTIL) (2.5 MG/3ML) 0.083% nebulizer solution Take 2.5 mg by nebulization 3 (three) times daily.    Yes Historical Provider, MD  carbidopa-levodopa (SINEMET IR) 25-250 MG  tablet Take 1 tablet by mouth 3 (three) times daily.   Yes Historical Provider, MD  cetirizine (ZYRTEC) 10 MG tablet Take 10 mg by mouth daily.   Yes Historical Provider, MD  furosemide (LASIX) 40 MG tablet Take 1 tablet (40 mg total) by mouth daily. 03/21/16  Yes Katharina Caper, MD  glipiZIDE (GLUCOTROL XL) 5 MG 24 hr tablet Take 1 tablet (5 mg total) by mouth daily with breakfast. Patient taking differently: Take 5 mg by mouth daily with breakfast. 10mg  in the morning. 5mg  at night 12/11/15  Yes Alford Highland, MD  insulin glargine (LANTUS) 100 UNIT/ML injection Inject 0.24 mLs (24 Units total) into the skin daily. Patient taking differently: Inject 10-15 Units into the skin at bedtime.  12/11/15  Yes Richard Renae Gloss, MD  insulin regular human CONCENTRATED (HUMULIN R) 500 UNIT/ML injection Inject 3-5 Units into the skin 2 (two) times daily with a meal. 5 units every morning and 3 units every evening   Yes Historical Provider, MD  lisinopril (PRINIVIL,ZESTRIL) 10 MG tablet Take 10 mg by mouth daily.   Yes Historical Provider, MD  potassium chloride (MICRO-K) 10 MEQ CR capsule Take 10 mEq by mouth daily.   Yes Historical Provider, MD  predniSONE (DELTASONE) 5 MG tablet Take 1 tablet (5 mg total) by mouth daily with breakfast. 12/22/15  Yes Altamese Dilling, MD  saxagliptin HCl (ONGLYZA) 5 MG TABS tablet Take 5 mg by mouth daily.   Yes Historical Provider, MD  warfarin (COUMADIN) 3 MG tablet Take 3 mg by mouth every evening. Pt takes on Monday ,wednesday, friday   Yes Historical Provider, MD  warfarin (COUMADIN) 5 MG tablet Take 5 mg by mouth every evening. Tuesday , thursday, Saturday, sunday   Yes Historical Provider, MD  fluticasone-salmeterol (ADVAIR HFA) 115-21 MCG/ACT inhaler Inhale 2 puffs into the lungs 2 (two) times daily. Patient not taking: Reported on 05/09/2016 12/11/15   Alford Highland, MD  polyethylene glycol [facility] / Ethelene Hal) packet Take 17 g by mouth daily. Patient not taking:  Reported on 05/09/2016 12/11/15   Alford Highland, MD      VITAL SIGNS:  Blood pressure 133/81, pulse 88, temperature 98.2 F (36.8 C), temperature source Oral, resp. rate 18, height 5' 9.5" (1.765 m), weight 112 kg (247 lb), SpO2 95 %.  PHYSICAL EXAMINATION:  GENERAL:  83 y.o.-year-old patient lying in the bed with no acute distress.  EYES: Pupils equal, round, reactive to light and accommodation. No scleral icterus. Extraocular muscles intact.  HEENT: Head atraumatic, normocephalic. Oropharynx and nasopharynx clear.  NECK:  Supple, no jugular venous distention. No thyroid enlargement, no tenderness.  LUNGS: Decreased breath sounds bilaterally.  CARDIOVASCULAR: S1, S2 irregular No murmurs, rubs, or gallops.  ABDOMEN: Soft, nontender, nondistended. Bowel sounds present. No organomegaly or mass.  EXTREMITIES: No pedal edema, cyanosis, or clubbing.  NEUROLOGIC: Cranial nerves II through XII are intact. Muscle strength 5/5 in all extremities. Sensation intact. Gait not  checked.  PSYCHIATRIC: The patient is alert and oriented x 3.  SKIN: No obvious rash, lesion, or ulcer.   LABORATORY PANEL:   CBC  Recent Labs Lab 05/09/16 1311  WBC 8.6  HGB 13.5  HCT 40.3  PLT 147*   ------------------------------------------------------------------------------------------------------------------  Chemistries   Recent Labs Lab 05/09/16 1311  NA 135  K 4.2  CL 101  CO2 31  GLUCOSE 395*  BUN 21*  CREATININE 1.21  CALCIUM 8.9  AST 21  ALT 6*  ALKPHOS 69  BILITOT 0.9   ------------------------------------------------------------------------------------------------------------------  Cardiac Enzymes  Recent Labs Lab 05/09/16 1311  TROPONINI 0.03*   ------------------------------------------------------------------------------------------------------------------  RADIOLOGY:  Dg Chest 1 View  Result Date: 05/09/2016 CLINICAL DATA:  Not acting RIGHT, lethargy, history hypertension,  diabetes mellitus, Parkinson's, TIA, chronic atrial fibrillation, chronic systolic CHF, COPD, stage III chronic kidney disease EXAM: CHEST 1 VIEW COMPARISON:  Portable exam 1325 hours compared 03/19/2016 FINDINGS: LEFT subclavian pacemaker with lead projecting over RIGHT ventricle. Enlargement of cardiac silhouette with pulmonary vascular congestion and postsurgical changes of MVR. New RIGHT upper lobe infiltrate consistent with pneumonia. Additional bibasilar opacities which could represent atelectasis or infiltrate. No pleural effusion or pneumothorax. Bones demineralized. IMPRESSION: Enlargement of cardiac silhouette with pulmonary vascular congestion post MVR and pacemaker. RIGHT upper lobe pneumonia with additional atelectasis versus infiltrate at both lung bases. Electronically Signed   By: Ulyses Southward M.D.   On: 05/09/2016 13:44   Ct Head Wo Contrast  Result Date: 05/09/2016 CLINICAL DATA:  83 year old male with lethargy and altered mental status. Prior history of stroke and TIA. EXAM: CT HEAD WITHOUT CONTRAST TECHNIQUE: Contiguous axial images were obtained from the base of the skull through the vertex without intravenous contrast. COMPARISON:  Head CT 03/19/2016. FINDINGS: Brain: Mild to moderate cerebral atrophy. Patchy and confluent areas of decreased attenuation are noted throughout the deep and periventricular white matter of the cerebral hemispheres bilaterally, compatible with chronic microvascular ischemic disease. No evidence of acute infarction, hemorrhage, hydrocephalus, extra-axial collection or mass lesion/mass effect. Vascular: No hyperdense vessel or unexpected calcification. Skull: Normal. Negative for fracture or focal lesion. Sinuses/Orbits: No acute finding. Other: None. IMPRESSION: 1. No acute intracranial abnormalities. 2. Mild moderate cerebral atrophy with extensive chronic microvascular ischemic changes in the cerebral white matter, as above. Electronically Signed   By: Trudie Reed M.D.   On: 05/09/2016 14:02    EKG:   Orders placed or performed during the hospital encounter of 05/09/16  . ED EKG  . ED EKG  . EKG 12-Lead  . EKG 12-Lead   Atrial fibrillation  t wave  inversion in aVF, 85 bpm IMPRESSION AND PLAN:   1. Altered mental status with metabolic encephalopathy likely due to pneumonia. ABG looks fine, CT head is normal. Patient is alert awake oriented after he is in the emergency room.   #2 bilateral pneumonia:; The chest showed right upper lobe pneumonia, pneumonia in both lung bases: Continue IV Rocephin, Zithromax, oxygen, nebulizers  #3 chronic atrial fibrillation: Rate controlled: Continue Coumadin, Toprol-XL.  Pharmacy to manage anticoagulation #3 diabetes Regular Insulin, Lantus.  4,History of Parkinson dementia: Continue Sinemet.  DNR. D/w wife  All the records are reviewed and case discussed with ED provider. Management plans discussed with the patient, family and they are in agreement.  CODE STATUS: DO NOT RESUSCITATE  TOTAL TIME TAKING CARE OF THIS PATIENT: 55 minutes.    Katha Hamming M.D on 05/09/2016 at 4:41 PM  Between 7am to 6pm - Pager -  [phone]  After 6pm go to [email or web address] - password [email or web address]  [facility]  Office  [phone]  CC: Primary care physician; Barbette ReichmannHANDE,VISHWANATH, MD  Note: This dictation was prepared with Dragon dictation along with smaller phrase technology. Any transcriptional errors that result from this process are unintentional.

## 2016-05-09 NOTE — Progress Notes (Signed)
ANTIBIOTIC CONSULT NOTE - INITIAL  Pharmacy Consult for Vancomycin, Levaquin  Indication: pneumonia  No Known Allergies  Patient Measurements: Height: 5' 9.5" (176.5 cm) Weight: 247 lb (112 kg) IBW/kg (Calculated) : 71.85 Adjusted Body Weight: 88.6 kg   Vital Signs: Temp: 98.2 F (36.8 C) (01/05 1304) Temp Source: Oral (01/05 1304) BP: 114/56 (01/05 1630) Pulse Rate: 100 (01/05 1630) Intake/Output from previous day: No intake/output data recorded. Intake/Output from this shift: No intake/output data recorded.  Labs:  Recent Labs  05/09/16 1311  WBC 8.6  HGB 13.5  PLT 147*  CREATININE 1.21   Estimated Creatinine Clearance: 50.4 mL/min (by C-G formula based on SCr of 1.21 mg/dL). No results for input(s): VANCOTROUGH, VANCOPEAK, VANCORANDOM, GENTTROUGH, GENTPEAK, GENTRANDOM, TOBRATROUGH, TOBRAPEAK, TOBRARND, AMIKACINPEAK, AMIKACINTROU, AMIKACIN in the last 72 hours.   Microbiology: No results found for this or any previous visit (from the past 720 hour(s)).  Medical History: Past Medical History:  Diagnosis Date  . Chronic atrial fibrillation (HCC)    a. on Couamdin; b. CHADS2VASc at least 7 (CHF, HTN, age x 2, DM, stroke x 2); c. s/p MAZE 1999  . Chronic systolic CHF (congestive heart failure) (HCC)    a. echo 08/01/2015: EF of 45%, mild LVH, mitral valve with annular calcification, mitral valve partially mobile  . CKD (chronic kidney disease), stage III   . COPD (chronic obstructive pulmonary disease) (HCC)   . Diabetes mellitus without complication (HCC)   . Hypertension   . Hypothyroidism   . Legally blind   . Mitral valve prolapse    a. s/p mitral valve repair 1999  . Normal coronary arteries    a. by cardiac cath 1999  . Parkinson's disease (HCC)   . Stroke (HCC)   . TIA (transient ischemic attack)   . UTI (lower urinary tract infection)     Medications:   (Not in a hospital admission) Assessment: CrCl = 50.9 ml/min Ke = 0.047 hr-1 T1/2 = 14.7  hrs Vd = 62 L   Goal of Therapy:  Vancomycin trough level 15-20 mcg/ml  Plan:  Follow up culture results   Will start Levaquin 500 mg IV Q24H on 1/6 (pt received azithromycin on 1/5).  Vancomycin 1250 mg IV X 1 ordered to be given on 1/5 @ 18:00. Vancomycin 1250 mg IV Q18H ordered to start on 1/6 @ 00:00, 6 hrs after 1st dose (stacked dosing).  This pt will reach [facility] by 1/8 @ 18:00. Will draw 1st trough on 1/8 @ 23:30, which will be at Css.   Urias Sheek D 05/09/2016,5:02 PM

## 2016-05-09 NOTE — ED Notes (Signed)
This nurse did not receive DNR paperwork from EMS or signature, Apolinar JunesBrandon. Second witness was Occupational psychologistJoanna nurse tech.

## 2016-05-09 NOTE — ED Notes (Signed)
Notified Dr. Langston MaskerShaevitz of elevated troponin

## 2016-05-09 NOTE — ED Provider Notes (Signed)
[facility] Emergency Department Provider Note  ____________________________________________   First MD Initiated Contact with Patient 05/09/16 1308     (approximate)  I have reviewed the triage vital signs and the nursing notes.   HISTORY  Chief Complaint Weakness and Altered Mental Status   HPI Troy [name] is a 83 y.o. male with a history of [facility] ED as well as COPD and diabetes who is presenting to the emergency department today with altered mental status. Per his family as well as his caretaker he was awake most of last night having to urinate. He was a very difficult to awake this morning. Upon EMS arrival they reported his oxygen saturations in the 70s. Family reports that he only uses oxygen as needed at home. No reported recent cough or runny nose or fever. He is denying any pain.  Family is also concerned about abdominal distention but the patient denies any pain. Had a bowel movement yesterday and is passing gas.  Per EMS, the patient needed to be sternal rubs in order to be awoken. However, once awake he has been alert as well as interactive and oriented.   Past Medical History:  Diagnosis Date  . Chronic atrial fibrillation (HCC)    a. on Couamdin; b. CHADS2VASc at least 7 (CHF, HTN, age x 2, DM, stroke x 2); c. s/p MAZE 1999  . Chronic systolic CHF (congestive heart failure) (HCC)    a. echo 08/01/2015: EF of 45%, mild LVH, mitral valve with annular calcification, mitral valve partially mobile  . CKD (chronic kidney disease), stage III   . COPD (chronic obstructive pulmonary disease) (HCC)   . Diabetes mellitus without complication (HCC)   . Hypertension   . Hypothyroidism   . Legally blind   . Mitral valve prolapse    a. s/p mitral valve repair 1999  . Normal coronary arteries    a. by cardiac cath 1999  . Parkinson's disease (HCC)   . Stroke (HCC)   . TIA (transient ischemic attack)   . UTI (lower urinary tract infection)      Patient Active Problem List   Diagnosis Date Noted  . Dehydration 03/21/2016  . Hypotension 03/21/2016  . Fall 03/21/2016  . Chest wall contusion, left, initial encounter 03/21/2016  . Hypoxia 03/21/2016  . Generalized weakness 03/21/2016  . Leukocytosis 03/21/2016  . Acute on chronic renal failure (HCC) 03/19/2016  . Acute exacerbation of chronic obstructive pulmonary disease (COPD) (HCC) 12/07/2015  . Acute on chronic respiratory failure with hypoxia (HCC) 09/28/2015  . HTN (hypertension) 09/25/2015  . Parkinson's disease (HCC) 09/25/2015  . Hypothyroidism 09/25/2015  . COPD exacerbation (HCC) 09/12/2015  . Diabetes (HCC) 08/20/2015  . SSS (sick sinus syndrome) (HCC) 08/07/2015  . Chronic atrial fibrillation (HCC)   . Sinus pause   . Sick sinus syndrome (HCC)   . Acute on chronic diastolic CHF (congestive heart failure), NYHA class 1 (HCC) 08/03/2015  . Lower extremity weakness 02/25/2015    Past Surgical History:  Procedure Laterality Date  . CARDIAC CATHETERIZATION N/A 08/08/2015   Procedure: Temporary Pacemaker;  Surgeon: Peter M Swaziland, MD;  Location: [facility] INVASIVE CV LAB;  Service: Cardiovascular;  Laterality: N/A;  . CARDIAC SURGERY    . CHOLECYSTECTOMY    . EP IMPLANTABLE DEVICE N/A 08/08/2015   Procedure: Pacemaker Implant;  Surgeon: Will Jorja Loa, MD;  Location: [facility] INVASIVE CV LAB;  Service: Cardiovascular;  Laterality: N/A;    Prior to Admission medications   Medication  Sig Start Date End Date Taking? Authorizing Provider  albuterol (PROVENTIL HFA) 108 (90 Base) MCG/ACT inhaler Inhale 2 puffs into the lungs every 4 (four) hours as needed for wheezing or shortness of breath. 07/11/15  Yes Sharman CheekPhillip Stafford, MD  albuterol (PROVENTIL) (2.5 MG/3ML) 0.083% nebulizer solution Take 2.5 mg by nebulization 3 (three) times daily.    Yes Historical Provider, MD  carbidopa-levodopa (SINEMET IR) 25-250 MG tablet Take 1 tablet by mouth 3 (three) times daily.   Yes Historical  Provider, MD  cetirizine (ZYRTEC) 10 MG tablet Take 10 mg by mouth daily.   Yes Historical Provider, MD  furosemide (LASIX) 40 MG tablet Take 1 tablet (40 mg total) by mouth daily. 03/21/16  Yes Katharina Caperima Vaickute, MD  glipiZIDE (GLUCOTROL XL) 5 MG 24 hr tablet Take 1 tablet (5 mg total) by mouth daily with breakfast. Patient taking differently: Take 5 mg by mouth daily with breakfast. 10mg  in the morning. 5mg  at night 12/11/15  Yes Alford Highlandichard Wieting, MD  insulin glargine (LANTUS) 100 UNIT/ML injection Inject 0.24 mLs (24 Units total) into the skin daily. Patient taking differently: Inject 10-15 Units into the skin at bedtime.  12/11/15  Yes Richard Renae GlossWieting, MD  insulin regular human CONCENTRATED (HUMULIN R) 500 UNIT/ML injection Inject 3-5 Units into the skin 2 (two) times daily with a meal. 5 units every morning and 3 units every evening   Yes Historical Provider, MD  lisinopril (PRINIVIL,ZESTRIL) 10 MG tablet Take 10 mg by mouth daily.   Yes Historical Provider, MD  potassium chloride (MICRO-K) 10 MEQ CR capsule Take 10 mEq by mouth daily.   Yes Historical Provider, MD  predniSONE (DELTASONE) 5 MG tablet Take 1 tablet (5 mg total) by mouth daily with breakfast. 12/22/15  Yes Altamese DillingVaibhavkumar Vachhani, MD  saxagliptin HCl (ONGLYZA) 5 MG TABS tablet Take 5 mg by mouth daily.   Yes Historical Provider, MD  warfarin (COUMADIN) 3 MG tablet Take 3 mg by mouth every evening. Pt takes on Monday ,wednesday, friday   Yes Historical Provider, MD  warfarin (COUMADIN) 5 MG tablet Take 5 mg by mouth every evening. Tuesday , thursday, Saturday, sunday   Yes Historical Provider, MD  fluticasone-salmeterol (ADVAIR HFA) 115-21 MCG/ACT inhaler Inhale 2 puffs into the lungs 2 (two) times daily. Patient not taking: Reported on 05/09/2016 12/11/15   Alford Highlandichard Wieting, MD  polyethylene glycol [facility](MIRALAX / Ethelene HalGLYCOLAX) packet Take 17 g by mouth daily. Patient not taking: Reported on 05/09/2016 12/11/15   Alford Highlandichard Wieting, MD    Allergies Patient  has no known allergies.  Family History  Problem Relation Age of Onset  . Hypertension Mother   . Stroke Mother   . CAD Father   . Lymphoma Sister   . Lung disease Brother     Social History Social History  Substance Use Topics  . Smoking status: Former Smoker    Packs/day: 4.00    Years: 20.00    Types: Cigarettes  . Smokeless tobacco: Never Used  . Alcohol use No    Review of Systems Constitutional: No fever/chills Eyes: No visual changes. ENT: No sore throat. Cardiovascular: Denies chest pain. Respiratory: Denies shortness of breath. Gastrointestinal: No abdominal pain.  No nausea, no vomiting.  No diarrhea.  No constipation. Genitourinary: Negative for dysuria. Musculoskeletal: Negative for back pain. Skin: Negative for rash. Neurological: Negative for headaches, focal weakness or numbness.  10-point ROS otherwise negative.  ____________________________________________   PHYSICAL EXAM:  VITAL SIGNS: ED Triage Vitals  Enc Vitals Group  BP 05/09/16 1304 133/81     Pulse Rate 05/09/16 1304 88     Resp 05/09/16 1304 18     Temp 05/09/16 1304 98.2 F (36.8 C)     Temp Source 05/09/16 1304 Oral     SpO2 05/09/16 1304 95 %     Weight 05/09/16 1306 247 lb (112 kg)     Height 05/09/16 1306 5' 9.5" (1.765 m)     Head Circumference --      Peak Flow --      Pain Score --      Pain Loc --      Pain Edu? --      Excl. in GC? --     Constitutional: Alert and oriented. Well appearing and in no acute distress. Eyes: Conjunctivae are normal. PERRL. EOMI. Head: Atraumatic. Nose: No congestion/rhinnorhea. Mouth/Throat: Mucous membranes are moist.   Neck: No stridor.   Cardiovascular: Normal rate, regular rhythm. Grossly normal heart sounds.  Respiratory: Normal respiratory effort.  No retractions. Mild wheezing throughout. Gastrointestinal: Soft and nontender. I'll distention.  Musculoskeletal: Mild edema to the bilateral lower extremities.  No joint  effusions. Neurologic:  Normal speech and language. No gross focal neurologic deficits are appreciated.  Skin:  Skin is warm, dry and intact. No rash noted. Psychiatric: Mood and affect are normal. Speech and behavior are normal.  ____________________________________________   LABS (all labs ordered are listed, but only abnormal results are displayed)  Labs Reviewed  CBC WITH DIFFERENTIAL/PLATELET - Abnormal; Notable for the following:       Result Value   RBC 4.39 (*)    RDW 15.3 (*)    Platelets 147 (*)    Neutro Abs 6.9 (*)    Lymphs Abs 0.8 (*)    All other components within normal limits  COMPREHENSIVE METABOLIC PANEL - Abnormal; Notable for the following:    Glucose, Bld 395 (*)    BUN 21 (*)    ALT 6 (*)    GFR calc non Af Amer 51 (*)    GFR calc Af Amer 59 (*)    Anion gap 3 (*)    All other components within normal limits  TROPONIN I - Abnormal; Notable for the following:    Troponin I 0.03 (*)    All other components within normal limits  URINALYSIS, COMPLETE (UACMP) WITH MICROSCOPIC - Abnormal; Notable for the following:    Color, Urine STRAW (*)    APPearance CLEAR (*)    Glucose, UA >=500 (*)    Hgb urine dipstick SMALL (*)    All other components within normal limits  LIPASE, BLOOD  TSH  BLOOD GAS, ARTERIAL  PROTIME-INR   ____________________________________________  EKG  ED ECG REPORT I, Arelia Longest, the attending physician, personally viewed and interpreted this ECG.   Date: 05/09/2016  EKG Time: 1306  Rate: 85  Rhythm: atrial fibrillation, rate 85. Also with PVCs.  Axis: Normal axis  Intervals:none  ST&T Change: No ST segment elevation or depression. No abnormal T-wave inversion. Baseline disturbance in the septal leads.  ____________________________________________  RADIOLOGY    CT Head Wo Contrast (Final result)  Result time 05/09/16 14:02:58  Final result by Florencia Reasons, MD (05/09/16 14:02:58)           Narrative:     CLINICAL DATA: 83 year old male with lethargy and altered mental status. Prior history of stroke and TIA.  EXAM: CT HEAD WITHOUT CONTRAST  TECHNIQUE: Contiguous axial images were obtained  from the base of the skull through the vertex without intravenous contrast.  COMPARISON: Head CT 03/19/2016.  FINDINGS: Brain: Mild to moderate cerebral atrophy. Patchy and confluent areas of decreased attenuation are noted throughout the deep and periventricular white matter of the cerebral hemispheres bilaterally, compatible with chronic microvascular ischemic disease. No evidence of acute infarction, hemorrhage, hydrocephalus, extra-axial collection or mass lesion/mass effect.  Vascular: No hyperdense vessel or unexpected calcification.  Skull: Normal. Negative for fracture or focal lesion.  Sinuses/Orbits: No acute finding.  Other: None.  IMPRESSION: 1. No acute intracranial abnormalities. 2. Mild moderate cerebral atrophy with extensive chronic microvascular ischemic changes in the cerebral white matter, as above.   Electronically Signed By: Trudie Reed M.D. On: 05/09/2016 14:02            DG Chest 1 View (Final result)  Result time 05/09/16 13:44:44  Final result by Ulyses Southward, MD (05/09/16 13:44:44)           Narrative:   CLINICAL DATA: Not acting RIGHT, lethargy, history hypertension, diabetes mellitus, Parkinson's, TIA, chronic atrial fibrillation, chronic systolic CHF, COPD, stage III chronic kidney disease  EXAM: CHEST 1 VIEW  COMPARISON: Portable exam 1325 hours compared 03/19/2016  FINDINGS: LEFT subclavian pacemaker with lead projecting over RIGHT ventricle.  Enlargement of cardiac silhouette with pulmonary vascular congestion and postsurgical changes of MVR.  New RIGHT upper lobe infiltrate consistent with pneumonia.  Additional bibasilar opacities which could represent atelectasis or infiltrate.  No pleural effusion or  pneumothorax.  Bones demineralized.  IMPRESSION: Enlargement of cardiac silhouette with pulmonary vascular congestion post MVR and pacemaker.  RIGHT upper lobe pneumonia with additional atelectasis versus infiltrate at both lung bases.   Electronically Signed By: Ulyses Southward M.D. On: 05/09/2016 13:44          ____________________________________________   PROCEDURES  Procedure(s) performed:   Procedures  Critical Care performed:   ____________________________________________   INITIAL IMPRESSION / ASSESSMENT AND PLAN / ED COURSE  Pertinent labs & imaging results that were available during my care of the patient were reviewed by me and considered in my medical decision making (see chart for details).   Clinical Course    ----------------------------------------- 3:05 PM on 05/09/2016 -----------------------------------------  Pending ABG at this time as well as INR. Patient will be treated for community acquired pneumonia. Plan to admit the patient. Patient continues to be awake and oriented 3 but only after arousal to voice. Patient as well as family were made for admission. Signed out to Dr. Pablo Lawrence for admission. ____________________________________________   FINAL CLINICAL IMPRESSION(S) / ED DIAGNOSES  Pneumonia. Altered mental status.  NEW MEDICATIONS STARTED DURING THIS VISIT:  New Prescriptions   No medications on file     Note:  This document was prepared using Dragon voice recognition software and may include unintentional dictation errors.    Myrna Blazer, MD 05/09/16 [phone]

## 2016-05-09 NOTE — Progress Notes (Signed)
ANTICOAGULATION CONSULT NOTE - Initial Consult  Pharmacy Consult for warfarin dosing Indication: chronic afib  No Known Allergies  Patient Measurements: Height: 5' 9.5" (176.5 cm) Weight: 247 lb (112 kg) IBW/kg (Calculated) : 71.85  Vital Signs: Temp: 98.2 F (36.8 C) (01/05 1304) Temp Source: Oral (01/05 1304) BP: 114/56 (01/05 1630) Pulse Rate: 100 (01/05 1630)  Labs:  Recent Labs  05/09/16 1311 05/09/16 1315  HGB 13.5  --   HCT 40.3  --   PLT 147*  --   LABPROT  --  34.0*  INR  --  3.26  CREATININE 1.21  --   TROPONINI 0.03*  --     Estimated Creatinine Clearance: 50.4 mL/min (by C-G formula based on SCr of 1.21 mg/dL).   Medical History: Past Medical History:  Diagnosis Date  . Chronic atrial fibrillation (HCC)    a. on Couamdin; b. CHADS2VASc at least 7 (CHF, HTN, age x 2, DM, stroke x 2); c. s/p MAZE 1999  . Chronic systolic CHF (congestive heart failure) (HCC)    a. echo 08/01/2015: EF of 45%, mild LVH, mitral valve with annular calcification, mitral valve partially mobile  . CKD (chronic kidney disease), stage III   . COPD (chronic obstructive pulmonary disease) (HCC)   . Diabetes mellitus without complication (HCC)   . Hypertension   . Hypothyroidism   . Legally blind   . Mitral valve prolapse    a. s/p mitral valve repair 1999  . Normal coronary arteries    a. by cardiac cath 1999  . Parkinson's disease (HCC)   . Stroke (HCC)   . TIA (transient ischemic attack)   . UTI (lower urinary tract infection)     Medications:  Scheduled:  . albuterol  2.5 mg Nebulization TID  . carbidopa-levodopa  1 tablet Oral TID  . docusate sodium  100 mg Oral BID  . furosemide  40 mg Oral Daily  . [START ON 05/10/2016] glipiZIDE  5 mg Oral Q breakfast  . insulin glargine  24 Units Subcutaneous Daily  . linagliptin  5 mg Oral Daily  . lisinopril  10 mg Oral Daily  . methylPREDNISolone (SOLU-MEDROL) injection  60 mg Intravenous Q24H  . metoprolol succinate  25 mg  Oral Daily  . mometasone-formoterol  2 puff Inhalation BID  . polyethylene glycol  17 g Oral Daily  . potassium chloride  10 mEq Oral Daily   Infusions:  . [START ON 05/10/2016] levofloxacin (LEVAQUIN) IV    . vancomycin    . [START ON 05/10/2016] vancomycin      Assessment: Pharmacy consulted to dose warfarin in this 89 yoM admitted with AMS and bilateral PNA. Pt has afib and takes warfarin every evening PTA. Per med rec, pt takes warfarin 3 mg Monday, Wednesday, Friday and 5 mg Tuesday, Thursday, Saturday and Sunday. According to spouse, last dose was 5 mg yesterday on 1/4 at 1900. Pt has history of mitral valve replacement making INR goal 2.5 - 3.5. INR upon admission is therapeutic at 3.26. In ED, pt received one dose of azithromycin and is scheduled to receive levofloxacin daily beginning tomorrow.   Goal of Therapy:  INR goal 2.5 - 3.5 (hx of mitral valve replacement) Monitor platelets by anticoagulation protocol: Yes   Plan:  With the possible interaction of these medications and the patients age, will empirically decrease the dose by 50%. PT/INR ordered with am labs. Pharmacy will continue to monitor and dose per protocol.  Date:  INR:  Dose: 1/5  3.26  1.5 mg   Horris Latino, PharmD Pharmacy Resident 05/09/2016 5:43 PM

## 2016-05-09 NOTE — Progress Notes (Signed)
Chaplain rounded the unit to provide a compassionate presence and support to the patient and family.  Chaplain Dollye Glasser [phone]  

## 2016-05-10 ENCOUNTER — Inpatient Hospital Stay: Payer: Medicare Other

## 2016-05-10 LAB — GLUCOSE, CAPILLARY
GLUCOSE-CAPILLARY: 369 mg/dL — AB (ref 65–99)
GLUCOSE-CAPILLARY: 367 mg/dL — AB (ref 65–99)
Glucose-Capillary: 395 mg/dL — ABNORMAL HIGH (ref 65–99)
Glucose-Capillary: 394 mg/dL — ABNORMAL HIGH (ref 65–99)
Glucose-Capillary: 407 mg/dL — ABNORMAL HIGH (ref 65–99)

## 2016-05-10 LAB — BASIC METABOLIC PANEL
Anion gap: 9 (ref 5–15)
BUN: 30 mg/dL — AB (ref 6–20)
CHLORIDE: 101 mmol/L (ref 101–111)
CO2: 26 mmol/L (ref 22–32)
CREATININE: 1.3 mg/dL — AB (ref 0.61–1.24)
Calcium: 8.5 mg/dL — ABNORMAL LOW (ref 8.9–10.3)
GFR calc Af Amer: 54 mL/min — ABNORMAL LOW (ref >60)
GFR calc non Af Amer: 47 mL/min — ABNORMAL LOW (ref >60)
GLUCOSE: 457 mg/dL — AB (ref 65–99)
POTASSIUM: 4.7 mmol/L (ref 3.5–5.1)
Sodium: 136 mmol/L (ref 135–145)

## 2016-05-10 LAB — CBC
HEMATOCRIT: 39.4 % — AB (ref 40.0–52.0)
Hemoglobin: 13.2 g/dL (ref 13.0–18.0)
MCH: 30.9 pg (ref 26.0–34.0)
MCHC: 33.5 g/dL (ref 32.0–36.0)
MCV: 92.1 fL (ref 80.0–100.0)
PLATELETS: 154 10*3/uL (ref 150–440)
RBC: 4.28 MIL/uL — ABNORMAL LOW (ref 4.40–5.90)
RDW: 15.1 % — ABNORMAL HIGH (ref 11.5–14.5)
WBC: 9.1 10*3/uL (ref 3.8–10.6)

## 2016-05-10 LAB — PROTIME-INR
INR: 3.3
Prothrombin Time: 34.3 seconds — ABNORMAL HIGH (ref 11.4–15.2)

## 2016-05-10 LAB — MRSA PCR SCREENING: MRSA by PCR: NEGATIVE

## 2016-05-10 MED ORDER — WARFARIN SODIUM 2.5 MG PO TABS
2.5000 mg | ORAL_TABLET | Freq: Once | ORAL | Status: AC
  Administered 2016-05-10: 19:00:00 2.5 mg via ORAL
  Filled 2016-05-10: qty 1

## 2016-05-10 MED ORDER — INSULIN ASPART 100 UNIT/ML ~~LOC~~ SOLN
0.0000 [IU] | Freq: Three times a day (TID) | SUBCUTANEOUS | Status: DC
  Administered 2016-05-10 (×2): 9 [IU] via SUBCUTANEOUS
  Administered 2016-05-11: 7 [IU] via SUBCUTANEOUS
  Administered 2016-05-11 – 2016-05-12 (×3): 5 [IU] via SUBCUTANEOUS
  Administered 2016-05-12: 09:00:00 7 [IU] via SUBCUTANEOUS
  Administered 2016-05-12: 12:00:00 9 [IU] via SUBCUTANEOUS
  Filled 2016-05-10: qty 7
  Filled 2016-05-10: qty 5
  Filled 2016-05-10 (×3): qty 9
  Filled 2016-05-10: qty 5
  Filled 2016-05-10: qty 7
  Filled 2016-05-10: qty 5
  Filled 2016-05-10: qty 9
  Filled 2016-05-10: qty 5

## 2016-05-10 MED ORDER — ALBUTEROL SULFATE (2.5 MG/3ML) 0.083% IN NEBU: 2.5000 mg | INHALATION_SOLUTION | RESPIRATORY_TRACT | Status: DC | PRN

## 2016-05-10 MED ORDER — IOPAMIDOL (ISOVUE-300) INJECTION 61%: 15.0000 mL | INTRAVENOUS | Status: AC

## 2016-05-10 MED ORDER — INSULIN ASPART 100 UNIT/ML ~~LOC~~ SOLN
8.0000 [IU] | Freq: Once | SUBCUTANEOUS | Status: AC
  Administered 2016-05-10: 8 [IU] via SUBCUTANEOUS
  Filled 2016-05-10: qty 8

## 2016-05-10 MED ORDER — ALBUTEROL SULFATE (2.5 MG/3ML) 0.083% IN NEBU: 2.5000 mg | INHALATION_SOLUTION | RESPIRATORY_TRACT | Status: DC

## 2016-05-10 MED ORDER — LEVOFLOXACIN IN D5W 750 MG/150ML IV SOLN
750.0000 mg | INTRAVENOUS | Status: DC
  Administered 2016-05-10: 17:00:00 750 mg via INTRAVENOUS
  Filled 2016-05-10 (×2): qty 150

## 2016-05-10 NOTE — Progress Notes (Signed)
ANTIBIOTIC CONSULT NOTE - INITIAL  Pharmacy Consult for Vancomycin, Levaquin  Indication: pneumonia  No Known Allergies  Patient Measurements: Height: 5' 9.5" (176.5 cm) Weight: 253 lb 4 oz (114.9 kg) (added SCD machine) IBW/kg (Calculated) : 71.85 Adjusted Body Weight: 88.6 kg   Vital Signs: Temp: 97.7 F (36.5 C) (01/06 0504) Temp Source: Oral (01/06 0504) BP: 135/70 (01/06 0504) Pulse Rate: 48 (01/06 0504) Intake/Output from previous day: 01/05 0701 - 01/06 0700 In: 750 [IV Piggyback:750] Out: -  Intake/Output from this shift: No intake/output data recorded.  Labs:  Recent Labs  05/09/16 1311 05/10/16 0508  WBC 8.6 9.1  HGB 13.5 13.2  PLT 147* 154  CREATININE 1.21 1.30*   Estimated Creatinine Clearance: 47.6 mL/min (by C-G formula based on SCr of 1.3 mg/dL (H)). No results for input(s): VANCOTROUGH, VANCOPEAK, VANCORANDOM, GENTTROUGH, GENTPEAK, GENTRANDOM, TOBRATROUGH, TOBRAPEAK, TOBRARND, AMIKACINPEAK, AMIKACINTROU, AMIKACIN in the last 72 hours.   Microbiology: Recent Results (from the past 720 hour(s))  MRSA PCR Screening     Status: Abnormal   Collection Time: 05/09/16  7:37 PM  Result Value Ref Range Status   MRSA by PCR (A) NEGATIVE Final    INVALID, UNABLE TO DETERMINE THE PRESENCE OF TARGET DNA DUE TO SPECIMEN INTEGRITY. RECOLLECTION REQUESTED.    Comment:        The GeneXpert MRSA Assay (FDA approved for NASAL specimens only), is one component of a comprehensive MRSA colonization surveillance program. It is not intended to diagnose MRSA infection nor to guide or monitor treatment for MRSA infections. C/DRUSILLA JACKSON AT 2248 05/09/16.PMH   MRSA PCR Screening     Status: Abnormal   Collection Time: 05/10/16  5:20 AM  Result Value Ref Range Status   MRSA by PCR (A) NEGATIVE Final    INVALID, UNABLE TO DETERMINE THE PRESENCE OF TARGET DNA DUE TO SPECIMEN INTEGRITY. RECOLLECTION REQUESTED.    Comment: RESULT CALLED TO, READ BACK BY AND VERIFIED  WITH: CHRIS BENNETT AT 8:09 ON 05/10/2016 JLJ     Medical History: Past Medical History:  Diagnosis Date  . Chronic atrial fibrillation (HCC)    a. on Couamdin; b. CHADS2VASc at least 7 (CHF, HTN, age x 2, DM, stroke x 2); c. s/p MAZE 1999  . Chronic systolic CHF (congestive heart failure) (HCC)    a. echo 08/01/2015: EF of 45%, mild LVH, mitral valve with annular calcification, mitral valve partially mobile  . CKD (chronic kidney disease), stage III   . COPD (chronic obstructive pulmonary disease) (HCC)   . Diabetes mellitus without complication (HCC)   . Hypertension   . Hypothyroidism   . Legally blind   . Mitral valve prolapse    a. s/p mitral valve repair 1999  . Normal coronary arteries    a. by cardiac cath 1999  . Parkinson's disease (HCC)   . Stroke (HCC)   . TIA (transient ischemic attack)   . UTI (lower urinary tract infection)     Medications:  Prescriptions Prior to Admission  Medication Sig Dispense Refill Last Dose  . albuterol (PROVENTIL HFA) 108 (90 Base) MCG/ACT inhaler Inhale 2 puffs into the lungs every 4 (four) hours as needed for wheezing or shortness of breath. 1 Inhaler 0 prn at prn  . albuterol (PROVENTIL) (2.5 MG/3ML) 0.083% nebulizer solution Take 2.5 mg by nebulization 3 (three) times daily.    prn at prn  . carbidopa-levodopa (SINEMET IR) 25-250 MG tablet Take 1 tablet by mouth 3 (three) times daily.   05/09/2016  at 0900  . cetirizine (ZYRTEC) 10 MG tablet Take 10 mg by mouth daily.   05/09/2016 at 0900  . furosemide (LASIX) 40 MG tablet Take 1 tablet (40 mg total) by mouth daily. 30 tablet 6 05/09/2016 at 0900  . glipiZIDE (GLUCOTROL XL) 5 MG 24 hr tablet Take 1 tablet (5 mg total) by mouth daily with breakfast. (Patient taking differently: Take 5 mg by mouth daily with breakfast. 10mg  in the morning. 5mg  at night) 30 tablet 0 05/09/2016 at 0900  . insulin glargine (LANTUS) 100 UNIT/ML injection Inject 0.24 mLs (24 Units total) into the skin daily. (Patient  taking differently: Inject 10-15 Units into the skin at bedtime. ) 10 mL 0 05/08/2016 at 1900  . insulin regular human CONCENTRATED (HUMULIN R) 500 UNIT/ML injection Inject 3-5 Units into the skin 2 (two) times daily with a meal. 5 units every morning and 3 units every evening   05/09/2016 at 0900  . lisinopril (PRINIVIL,ZESTRIL) 10 MG tablet Take 10 mg by mouth daily.   05/08/2016 at 1900  . potassium chloride (MICRO-K) 10 MEQ CR capsule Take 10 mEq by mouth daily.   05/08/2016 at 1200  . predniSONE (DELTASONE) 5 MG tablet Take 1 tablet (5 mg total) by mouth daily with breakfast. 30 tablet 0 05/09/2016 at 0900  . saxagliptin HCl (ONGLYZA) 5 MG TABS tablet Take 5 mg by mouth daily.   05/09/2016 at 0900  . warfarin (COUMADIN) 3 MG tablet Take 3 mg by mouth every evening. Pt takes on Monday ,wednesday, friday   05/07/2016 at 1900  . warfarin (COUMADIN) 5 MG tablet Take 5 mg by mouth every evening. Tuesday , thursday, Saturday, sunday   05/08/2016 at 1900  . fluticasone-salmeterol (ADVAIR HFA) 115-21 MCG/ACT inhaler Inhale 2 puffs into the lungs 2 (two) times daily. (Patient not taking: Reported on 05/09/2016) 1 Inhaler 0 Not Taking at Unknown time  . polyethylene glycol (MIRALAX / GLYCOLAX) packet Take 17 g by mouth daily. (Patient not taking: Reported on 05/09/2016) 30 each 0 Not Taking at Unknown time   Assessment: CrCl = 50.9 ml/min Ke = 0.047 hr-1 T1/2 = 14.7 hrs Vd = 62 L   Goal of Therapy:  Vancomycin trough level 15-20 mcg/ml  Plan:  Follow up culture results   Will start Levaquin 750 mg IV Q48H this evening   Vancomycin 1250 mg IV X 1 ordered to be given on 1/5 @ 18:00. Vancomycin 1250 mg IV Q18H ordered to start on 1/6 @ 00:00, 6 hrs after 1st dose (stacked dosing).  This pt will reach Css by 1/8 @ 18:00. Will draw 1st trough on 1/8 @ 23:30, which will be at Css.   Troy [name] C 05/10/2016,8:31 AM

## 2016-05-10 NOTE — Progress Notes (Signed)
ANTICOAGULATION CONSULT NOTE - Initial Consult  Pharmacy Consult for warfarin dosing Indication: chronic afib  No Known Allergies  Patient Measurements: Height: 5' 9.5" (176.5 cm) Weight: 253 lb 4 oz (114.9 kg) (added SCD machine) IBW/kg (Calculated) : 71.85  Vital Signs: Temp: 97.7 F (36.5 C) (01/06 0504) Temp Source: Oral (01/06 0504) BP: 135/70 (01/06 0504) Pulse Rate: 48 (01/06 0504)  Labs:  Recent Labs  05/09/16 1311 05/09/16 1315 05/10/16 0508  HGB 13.5  --  13.2  HCT 40.3  --  39.4*  PLT 147*  --  154  LABPROT  --  34.0* 34.3*  INR  --  3.26 3.30  CREATININE 1.21  --  1.30*  TROPONINI 0.03*  --   --     Estimated Creatinine Clearance: 47.6 mL/min (by C-G formula based on SCr of 1.3 mg/dL (H)).   Medical History: Past Medical History:  Diagnosis Date  . Chronic atrial fibrillation (HCC)    a. on Couamdin; b. CHADS2VASc at least 7 (CHF, HTN, age x 2, DM, stroke x 2); c. s/p MAZE 1999  . Chronic systolic CHF (congestive heart failure) (HCC)    a. echo 08/01/2015: EF of 45%, mild LVH, mitral valve with annular calcification, mitral valve partially mobile  . CKD (chronic kidney disease), stage III   . COPD (chronic obstructive pulmonary disease) (HCC)   . Diabetes mellitus without complication (HCC)   . Hypertension   . Hypothyroidism   . Legally blind   . Mitral valve prolapse    a. s/p mitral valve repair 1999  . Normal coronary arteries    a. by cardiac cath 1999  . Parkinson's disease (HCC)   . Stroke (HCC)   . TIA (transient ischemic attack)   . UTI (lower urinary tract infection)     Medications:  Scheduled:  . albuterol  2.5 mg Nebulization TID  . carbidopa-levodopa  1 tablet Oral TID  . docusate sodium  100 mg Oral BID  . furosemide  40 mg Oral Daily  . glipiZIDE  5 mg Oral Q breakfast  . insulin glargine  24 Units Subcutaneous Daily  . levofloxacin (LEVAQUIN) IV  750 mg Intravenous Q48H  . linagliptin  5 mg Oral Daily  . lisinopril  10  mg Oral Daily  . methylPREDNISolone (SOLU-MEDROL) injection  60 mg Intravenous Q24H  . metoprolol succinate  25 mg Oral Daily  . mometasone-formoterol  2 puff Inhalation BID  . polyethylene glycol  17 g Oral Daily  . potassium chloride  10 mEq Oral Daily  . vancomycin  1,250 mg Intravenous Q18H  . Warfarin - Pharmacist Dosing Inpatient   Does not apply q1800   Infusions:    Assessment: Pharmacy consulted to dose warfarin in this 86 yoM admitted with AMS and bilateral PNA. Pt has afib and takes warfarin every evening PTA. Per med rec, pt takes warfarin 3 mg Monday, Wednesday, Friday and 5 mg Tuesday, Thursday, Saturday and Sunday. According to spouse, last dose was 5 mg yesterday on 1/4 at 1900. Pt has history of mitral valve replacement making INR goal 2.5 - 3.5. INR upon admission is therapeutic at 3.26. In ED, pt received one dose of azithromycin and is scheduled to receive levofloxacin daily beginning tomorrow.   Goal of Therapy:  INR goal 2.5 - 3.5 (hx of mitral valve replacement) Monitor platelets by anticoagulation protocol: Yes   Plan:  With the possible interaction of these medications and the patients age, will empirically decrease the dose by  50%. PT/INR ordered with am labs. Pharmacy will continue to monitor and dose per protocol.  Date:  INR:  Dose: 1/5  3.26  1.5 mg 1/6  3.30  2.5mg    Garlon HatchetJody Ruberta Holck, PharmD, BCPS Clinical Pharmacist  05/10/2016 8:42 AM

## 2016-05-10 NOTE — Progress Notes (Signed)
[facility] [place] [facility]   PATIENT NAME: Troy [name]    MR#:  [id number]  DATE OF BIRTH:  1924-03-03  SUBJECTIVE: Admitted for weakness, altered mental status, found to have bilateral pneumonia. Patient alert, awake, oriented now. Does have some wheezing bilaterally. No hypoxia. No fever.   CHIEF COMPLAINT:   Chief Complaint  Patient presents with  . Weakness  . Altered Mental Status    REVIEW OF SYSTEMS:   ROS CONSTITUTIONAL: No fever, fatigue or weakness.  EYES: No blurred or double vision.  EARS, NOSE, AND THROAT: No tinnitus or ear pain.  RESPIRATORY: Cough, shortness of breath.  CARDIOVASCULAR: No chest pain, orthopnea, edema.  GASTROINTESTINAL: No nausea, vomiting, diarrhea or abdominal pain.  GENITOURINARY: No dysuria, hematuria.  ENDOCRINE: No polyuria, nocturia,  HEMATOLOGY: No anemia, easy bruising or bleeding SKIN: No rash or lesion. MUSCULOSKELETAL: No joint pain or arthritis.   NEUROLOGIC: No tingling, numbness, weakness.  PSYCHIATRY: No anxiety or depression.   DRUG ALLERGIES:  No Known Allergies  VITALS:  Blood pressure 129/62, pulse 68, temperature 98 F (36.7 C), temperature source Oral, resp. rate 19, height 5' 9.5" (1.765 m), weight 114.9 kg (253 lb 4 oz), SpO2 100 %.  PHYSICAL EXAMINATION:  GENERAL:  83 y.o.-year-old patient lying in the bed with no acute distress.  EYES: Pupils equal, round, reactive to light and accommodation. No scleral icterus. Extraocular muscles intact.  HEENT: Head atraumatic, normocephalic. Oropharynx and nasopharynx clear.  NECK:  Supple, no jugular venous distention. No thyroid enlargement, no tenderness.  LUNGS: Mild expiratory wheezes present in middle lobe  Of  both lungs.  CARDIOVASCULAR: S1, S2 normal. No murmurs, rubs, or gallops.  ABDOMEN: Soft, nontender, nondistended. Bowel sounds present. No organomegaly or mass.  EXTREMITIES: No pedal edema, cyanosis, or clubbing.   NEUROLOGIC: Cranial nerves II through XII are intact. Muscle strength 5/5 in all extremities. Sensation intact. Gait not checked.  PSYCHIATRIC: The patient is alert and oriented x 3.  SKIN: No obvious rash, lesion, or ulcer.    LABORATORY PANEL:   CBC  Recent Labs Lab 05/10/16 0508  WBC 9.1  HGB 13.2  HCT 39.4*  PLT 154   ------------------------------------------------------------------------------------------------------------------  Chemistries   Recent Labs Lab 05/09/16 1311 05/10/16 0508  NA 135 136  K 4.2 4.7  CL 101 101  CO2 31 26  GLUCOSE 395* 457*  BUN 21* 30*  CREATININE 1.21 1.30*  CALCIUM 8.9 8.5*  AST 21  --   ALT 6*  --   ALKPHOS 69  --   BILITOT 0.9  --    ------------------------------------------------------------------------------------------------------------------  Cardiac Enzymes  Recent Labs Lab 05/09/16 1311  TROPONINI 0.03*   ------------------------------------------------------------------------------------------------------------------  RADIOLOGY:  Dg Chest 1 View  Result Date: 05/09/2016 CLINICAL DATA:  Not acting RIGHT, lethargy, history hypertension, diabetes mellitus, Parkinson's, TIA, chronic atrial fibrillation, chronic systolic CHF, COPD, stage III chronic kidney disease EXAM: CHEST 1 VIEW COMPARISON:  Portable exam 1325 hours compared 03/19/2016 FINDINGS: LEFT subclavian pacemaker with lead projecting over RIGHT ventricle. Enlargement of cardiac silhouette with pulmonary vascular congestion and postsurgical changes of MVR. New RIGHT upper lobe infiltrate consistent with pneumonia. Additional bibasilar opacities which could represent atelectasis or infiltrate. No pleural effusion or pneumothorax. Bones demineralized. IMPRESSION: Enlargement of cardiac silhouette with pulmonary vascular congestion post MVR and pacemaker. RIGHT upper lobe pneumonia with additional atelectasis versus infiltrate at both lung bases. Electronically  Signed   By: Ulyses SouthwardMark  Boles M.D.   On: 05/09/2016 13:44  Ct Head Wo Contrast  Result Date: 05/09/2016 CLINICAL DATA:  83 year old male with lethargy and altered mental status. Prior history of stroke and TIA. EXAM: CT HEAD WITHOUT CONTRAST TECHNIQUE: Contiguous axial images were obtained from the base of the skull through the vertex without intravenous contrast. COMPARISON:  Head CT 03/19/2016. FINDINGS: Brain: Mild to moderate cerebral atrophy. Patchy and confluent areas of decreased attenuation are noted throughout the deep and periventricular white matter of the cerebral hemispheres bilaterally, compatible with chronic microvascular ischemic disease. No evidence of acute infarction, hemorrhage, hydrocephalus, extra-axial collection or mass lesion/mass effect. Vascular: No hyperdense vessel or unexpected calcification. Skull: Normal. Negative for fracture or focal lesion. Sinuses/Orbits: No acute finding. Other: None. IMPRESSION: 1. No acute intracranial abnormalities. 2. Mild moderate cerebral atrophy with extensive chronic microvascular ischemic changes in the cerebral white matter, as above. Electronically Signed   By: Trudie Reed M.D.   On: 05/09/2016 14:02    EKG:   Orders placed or performed during the hospital encounter of 05/09/16  . ED EKG  . ED EKG  . EKG 12-Lead  . EKG 12-Lead    ASSESSMENT AND PLAN:   83 year old  Male  patient with generalized weakness, altered mental status found to have bilateral pneumonia. Admitted to hospitalist service, started on IV antibiotics. A shunt is on vancomycin, Levaquin. Continue them till Monday, likely discharge on Monday with by mouth antibiotics. continue  oxygen, patient on 2 L of oxygen at home. Continue IV steroids.  #2 mild dehydration mild acute kidney injury: Increased fluid intake, monitor kidney function closely.  #3 mildly elevated troponins without any chest pain secondary to shortness of breath  #4 metabolic encephalopathy  secondary to pneumonia. ABG fine. Alert awake now oriented. Patient does have dementia so maybe metabolic encephalopathy and also dementia causing some confusion  At home   5 chronic atrial fibrillation: Rate controlled continue warfarin, Toprol XL. #6 diabetes mellitus type 2: Patient on Tradjenta,glucotrol.add SSI with coverage. hyperrsecondary to steroids.   All the records are reviewed and case discussed with Care Management/Social Workerr. Management plans discussed with the patient, family and they are in agreement.   CODE STATUS: DO NOT RESUSCITATE  TOTAL TIME TAKING CARE OF THIS PATIENT: .   POSSIBLE D/C IN 1-2 DAYS, DEPENDING ON CLINICAL CONDITION.   Katha Hamming M.D on 05/10/2016 at 10:05 AM  Between 7am to 6pm - Pager - [phone]  After 6pm go to [email or web address] - password [email or web address]  [facility]  Office  [phone]  CC: Primary care physician; Barbette Reichmann, MD   Note: This dictation was prepared with Dragon dictation along with smaller phrase technology. Any transcriptional errors that result from this process are unintentional.

## 2016-05-11 ENCOUNTER — Inpatient Hospital Stay: Payer: Medicare Other

## 2016-05-11 LAB — GLUCOSE, CAPILLARY
GLUCOSE-CAPILLARY: 292 mg/dL — AB (ref 65–99)
GLUCOSE-CAPILLARY: 316 mg/dL — AB (ref 65–99)
GLUCOSE-CAPILLARY: 257 mg/dL — AB (ref 65–99)
Glucose-Capillary: 433 mg/dL — ABNORMAL HIGH (ref 65–99)

## 2016-05-11 LAB — PROTIME-INR
INR: 3.85
PROTHROMBIN TIME: 38.8 s — AB (ref 11.4–15.2)

## 2016-05-11 MED ORDER — INSULIN GLARGINE 100 UNIT/ML ~~LOC~~ SOLN
28.0000 [IU] | Freq: Every day | SUBCUTANEOUS | Status: DC
  Administered 2016-05-12: 09:00:00 28 [IU] via SUBCUTANEOUS
  Filled 2016-05-11 (×2): qty 0.28

## 2016-05-11 MED ORDER — SIMETHICONE 80 MG PO CHEW: 80.0000 mg | CHEWABLE_TABLET | Freq: Four times a day (QID) | ORAL | Status: DC | PRN

## 2016-05-11 MED ORDER — ALBUTEROL SULFATE (2.5 MG/3ML) 0.083% IN NEBU
2.5000 mg | INHALATION_SOLUTION | RESPIRATORY_TRACT | Status: DC
  Administered 2016-05-11 – 2016-05-13 (×12): 2.5 mg via RESPIRATORY_TRACT
  Filled 2016-05-11 (×12): qty 3

## 2016-05-11 MED ORDER — INSULIN ASPART 100 UNIT/ML ~~LOC~~ SOLN
9.0000 [IU] | Freq: Once | SUBCUTANEOUS | Status: AC
  Administered 2016-05-11: 22:00:00 9 [IU] via SUBCUTANEOUS

## 2016-05-11 NOTE — Progress Notes (Signed)
[facility] [place] [facility]   PATIENT NAME: Troy [name]    MR#:  [id number]  DATE OF BIRTH:  November 26, 1923  SUBJECTIVE: Wheezing this morning. No abdominal pain but the anxiety, says that he couldn't sleep last night . Increased tremors observed.   CHIEF COMPLAINT:   Chief Complaint  Patient presents with  . Weakness  . Altered Mental Status    REVIEW OF SYSTEMS:   Review of Systems  Constitutional: Negative for chills and fever.  HENT: Negative for hearing loss.   Eyes: Negative for blurred vision, double vision and photophobia.  Respiratory: Positive for wheezing. Negative for cough, hemoptysis and shortness of breath.   Cardiovascular: Negative for palpitations, orthopnea and leg swelling.  Gastrointestinal: Negative for abdominal pain, diarrhea and vomiting.  Genitourinary: Negative for dysuria and urgency.  Musculoskeletal: Negative for myalgias and neck pain.  Skin: Negative for rash.  Neurological: Negative for dizziness, focal weakness, seizures, weakness and headaches.  Psychiatric/Behavioral: Negative for memory loss. The patient does not have insomnia.     DRUG ALLERGIES:  No Known Allergies  VITALS:  Blood pressure 130/75, pulse 80, temperature 97.4 F (36.3 C), temperature source Oral, resp. rate 20, height 5' 9.5" (1.765 m), weight 116.3 kg (256 lb 8 oz), SpO2 97 %.  PHYSICAL EXAMINATION:  GENERAL:  83 y.o.-year-old patient lying in the bed with no acute distress.  EYES: Pupils equal, round, reactive to light and accommodation. No scleral icterus. Extraocular muscles intact.  HEENT: Head atraumatic, normocephalic. Oropharynx and nasopharynx clear.  NECK:  Supple, no jugular venous distention. No thyroid enlargement, no tenderness.  LUNGS: bilateral  expiratory wheeze in all lung fields. CARDIOVASCULAR: S1, S2 normal. No murmurs, rubs, or gallops.  ABDOMEN: Soft, nontender, nondistended. Bowel sounds present. No organomegaly  or mass.  EXTREMITIES: No pedal edema, cyanosis, or clubbing.  NEUROLOGIC: Cranial nerves II through XII are intact. Muscle strength 5/5 in all extremities. Sensation intact. Gait not checked.  PSYCHIATRIC: The patient is alert and oriented x 3.  SKIN: No obvious rash, lesion, or ulcer.    LABORATORY PANEL:   CBC  Recent Labs Lab 05/10/16 0508  WBC 9.1  HGB 13.2  HCT 39.4*  PLT 154   ------------------------------------------------------------------------------------------------------------------  Chemistries   Recent Labs Lab 05/09/16 1311 05/10/16 0508  NA 135 136  K 4.2 4.7  CL 101 101  CO2 31 26  GLUCOSE 395* 457*  BUN 21* 30*  CREATININE 1.21 1.30*  CALCIUM 8.9 8.5*  AST 21  --   ALT 6*  --   ALKPHOS 69  --   BILITOT 0.9  --    ------------------------------------------------------------------------------------------------------------------  Cardiac Enzymes  Recent Labs Lab 05/09/16 1311  TROPONINI 0.03*   ------------------------------------------------------------------------------------------------------------------  RADIOLOGY:  Ct Abdomen Pelvis Wo Contrast  Result Date: 05/11/2016 CLINICAL DATA:  83 y/o M; 1 month of increasing abdominal distention. History of hernia repair. EXAM: CT ABDOMEN AND PELVIS WITHOUT CONTRAST TECHNIQUE: Multidetector CT imaging of the abdomen and pelvis was performed following the standard protocol without IV contrast. COMPARISON:  03/19/2016 CT abdomen and pelvis FINDINGS: Lower chest: Partially visualized pacemaking leads. Trace bilateral pleural effusions. Minor bibasilar atelectasis of the lungs. Hepatobiliary: No focal liver abnormality is seen. Status post cholecystectomy. No biliary dilatation. Pancreas: Unremarkable. No pancreatic ductal dilatation or surrounding inflammatory changes. Spleen: Normal in size without focal abnormality. Adrenals/Urinary Tract: Multiple well-circumscribed homogeneous low-attenuation lesions  of the kidneys by lateral measuring up to 76 mm at the left kidney lower  pole consistent with multiple renal cysts. Some demonstrate intermediate attenuation and are likely hemorrhagic cysts. No urinary stone disease or obstructive uropathy. Mild heterogeneous irregularity of the wall of the bladder may be due to hypertrophy from outflow obstruction. Stomach/Bowel: Stomach is within normal limits. Appendix appears normal. No evidence of bowel wall thickening, distention, or inflammatory changes. Extensive sigmoid diverticulosis without evidence of diverticulitis. Vascular/Lymphatic: Aortic atherosclerosis. No enlarged abdominal or pelvic lymph nodes. Reproductive: Mild prostate hypertrophy with calcifications. Other: No abdominal wall hernia or abnormality. No abdominopelvic ascites. Musculoskeletal: There is an electrode within the left aspect of the sacrum at the S2-3 level with device in the left flank soft tissues. Multilevel degenerative changes of the thoracic and lumbar spine with prominent lower lumbar facet arthropathy and grade 1 degenerative L4-5 anterolisthesis is stable.Mild osteoarthrosis of the hip joints bilaterally as well as the symphysis pubis. Mild lumbar levocurvature. No acute osseous abnormality is evident. Chronic left lower rib fractures. 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 IMPRESSION: 1. No acute process identified. 2. Trace bilateral pleural effusions. 3. Sigmoid diverticulosis without evidence of diverticulitis. 4. Aortic atherosclerosis. 5. Thoracolumbar spinal degenerative changes are stable. Electronically Signed   By: Mitzi Hansen M.D.   On: 05/11/2016 01:16   Dg Chest 1 View  Result Date: 05/09/2016 CLINICAL DATA:  Not acting RIGHT, lethargy, history hypertension, diabetes mellitus, Parkinson's, TIA, chronic atrial fibrillation, chronic systolic CHF, COPD, stage III chronic kidney disease EXAM: CHEST 1 VIEW COMPARISON:  Portable exam 1325 hours  compared 03/19/2016 FINDINGS: LEFT subclavian pacemaker with lead projecting over RIGHT ventricle. Enlargement of cardiac silhouette with pulmonary vascular congestion and postsurgical changes of MVR. New RIGHT upper lobe infiltrate consistent with pneumonia. Additional bibasilar opacities which could represent atelectasis or infiltrate. No pleural effusion or pneumothorax. Bones demineralized. IMPRESSION: Enlargement of cardiac silhouette with pulmonary vascular congestion post MVR and pacemaker. RIGHT upper lobe pneumonia with additional atelectasis versus infiltrate at both lung bases. Electronically Signed   By: Ulyses Southward M.D.   On: 05/09/2016 13:44   Ct Head Wo Contrast  Result Date: 05/09/2016 CLINICAL DATA:  83 year old male with lethargy and altered mental status. Prior history of stroke and TIA. EXAM: CT HEAD WITHOUT CONTRAST TECHNIQUE: Contiguous axial images were obtained from the base of the skull through the vertex without intravenous contrast. COMPARISON:  Head CT 03/19/2016. FINDINGS: Brain: Mild to moderate cerebral atrophy. Patchy and confluent areas of decreased attenuation are noted throughout the deep and periventricular white matter of the cerebral hemispheres bilaterally, compatible with chronic microvascular ischemic disease. No evidence of acute infarction, hemorrhage, hydrocephalus, extra-axial collection or mass lesion/mass effect. Vascular: No hyperdense vessel or unexpected calcification. Skull: Normal. Negative for fracture or focal lesion. Sinuses/Orbits: No acute finding. Other: None. IMPRESSION: 1. No acute intracranial abnormalities. 2. Mild moderate cerebral atrophy with extensive chronic microvascular ischemic changes in the cerebral white matter, as above. Electronically Signed   By: Trudie Reed M.D.   On: 05/09/2016 14:02    EKG:   Orders placed or performed during the hospital encounter of 05/09/16  . ED EKG  . ED EKG  . EKG 12-Lead  . EKG 12-Lead     ASSESSMENT AND PLAN:   83 year old  Male  patient with generalized weakness, altered mental status found to have bilateral pneumonia. Admitted to hospitalist service, started on IV antibiotics. He  is on vancomycin, Levaquin. Continue them till Monday, likely discharge on Monday with by mouth antibiotics. continue  oxygen, patient on 2 L  of oxygen at home.  Lot of wheezing today, continue steroids, oxygen, nebulizers every 4 hours instead of when necessary.  #2 mild dehydration mild acute kidney injury:   #3 mildly elevated troponins without any chest pain secondary to shortness of breath  #4 metabolic encephalopathy secondary to pneumonia. Resolved.   5 chronic atrial fibrillation: Rate controlled continue warfarin, Toprol X Pharmacy to manage manage anticoagulation. . #6 diabetes mellitus type 2: Patient on Tradjenta,glucotrol.add SSI with coverage. hyperrsecondary to steroids.   All the records are reviewed and case discussed with Care Management/Social Workerr. Management plans discussed with the patient, family and they are in agreement.   CODE STATUS: DO NOT RESUSCITATE  TOTAL TIME TAKING CARE OF THIS PATIENT: .   POSSIBLE D/C IN 1-2 DAYS, DEPENDING ON CLINICAL CONDITION.   Katha Hamming M.D on 05/11/2016 at 12:34 PM  Between 7am to 6pm - Pager - [phone]  After 6pm go to [email or web address] - password [email or web address]  [facility]  Office  [phone]  CC: Primary care physician; Barbette Reichmann, MD   Note: This dictation was prepared with Dragon dictation along with smaller phrase technology. Any transcriptional errors that result from this process are unintentional.

## 2016-05-11 NOTE — Progress Notes (Signed)
ANTICOAGULATION CONSULT NOTE - Initial Consult  Pharmacy Consult for warfarin dosing Indication: chronic afib, mitral valve replacement  No Known Allergies  Patient Measurements: Height: 5' 9.5" (176.5 cm) Weight: 256 lb 8 oz (116.3 kg) IBW/kg (Calculated) : 71.85  Vital Signs: Temp: 97.4 F (36.3 C) (01/07 0420) Temp Source: Oral (01/07 0420) BP: 140/72 (01/07 0420) Pulse Rate: 85 (01/07 0420)  Labs:  Recent Labs  05/09/16 1311 05/09/16 1315 05/10/16 0508 05/11/16 0528  HGB 13.5  --  13.2  --   HCT 40.3  --  39.4*  --   PLT 147*  --  154  --   LABPROT  --  34.0* 34.3* 38.8*  INR  --  3.26 3.30 3.85  CREATININE 1.21  --  1.30*  --   TROPONINI 0.03*  --   --   --     Estimated Creatinine Clearance: 47.9 mL/min (by C-G formula based on SCr of 1.3 mg/dL (H)).   Infusions:    Assessment: Pharmacy consulted to dose warfarin in this [age] yoM admitted with AMS and bilateral PNA. Pt has afib and takes warfarin every evening PTA. Per med rec, pt takes warfarin 3 mg Monday, Wednesday, Friday and 5 mg Tuesday, Thursday, Saturday and Sunday. According to spouse, last dose was 5 mg yesterday on 1/4 at 1900. Pt has history of mitral valve replacement making INR goal 2.5 - 3.5. INR upon admission is therapeutic at 3.26. In ED, pt received one dose of azithromycin and is scheduled to receive levofloxacin daily beginning tomorrow.   Date:  INR:  Dose: 1/5  3.26  1.5 mg 1/6  3.30  2.5mg  1/7  3.85  HOLD   Goal of Therapy:  INR goal 2.5 - 3.5 (hx of mitral valve replacement) Monitor platelets by anticoagulation protocol: Yes   Plan:  INR supratherapeutic and trending up. Patient continued on levofloxacin. Will hold warfarin dose tonight, recheck INR with AM labs.   Garlon HatchetJody Rogan Ecklund, PharmD, BCPS Clinical Pharmacist  05/11/2016 8:16 AM

## 2016-05-12 LAB — GLUCOSE, CAPILLARY
GLUCOSE-CAPILLARY: 391 mg/dL — AB (ref 65–99)
GLUCOSE-CAPILLARY: 356 mg/dL — AB (ref 65–99)
GLUCOSE-CAPILLARY: 285 mg/dL — AB (ref 65–99)
Glucose-Capillary: 310 mg/dL — ABNORMAL HIGH (ref 65–99)
Glucose-Capillary: 348 mg/dL — ABNORMAL HIGH (ref 65–99)

## 2016-05-12 LAB — PROTIME-INR
INR: 2.99
Prothrombin Time: 31.7 seconds — ABNORMAL HIGH (ref 11.4–15.2)

## 2016-05-12 MED ORDER — INSULIN ASPART 100 UNIT/ML ~~LOC~~ SOLN
0.0000 [IU] | Freq: Three times a day (TID) | SUBCUTANEOUS | Status: DC
  Administered 2016-05-13: 3 [IU] via SUBCUTANEOUS
  Administered 2016-05-13: 11 [IU] via SUBCUTANEOUS
  Filled 2016-05-12: qty 11
  Filled 2016-05-12: qty 3

## 2016-05-12 MED ORDER — WARFARIN SODIUM 2 MG PO TABS
2.0000 mg | ORAL_TABLET | Freq: Every day | ORAL | Status: DC
  Administered 2016-05-12: 18:00:00 2 mg via ORAL
  Filled 2016-05-12: qty 1

## 2016-05-12 MED ORDER — INSULIN GLARGINE 100 UNIT/ML ~~LOC~~ SOLN
32.0000 [IU] | Freq: Every day | SUBCUTANEOUS | Status: DC
  Administered 2016-05-13: 08:00:00 32 [IU] via SUBCUTANEOUS
  Filled 2016-05-12: qty 0.32

## 2016-05-12 MED ORDER — INSULIN ASPART 100 UNIT/ML ~~LOC~~ SOLN
5.0000 [IU] | Freq: Three times a day (TID) | SUBCUTANEOUS | Status: DC
  Administered 2016-05-13 (×2): 5 [IU] via SUBCUTANEOUS
  Filled 2016-05-12 (×2): qty 5

## 2016-05-12 MED ORDER — LEVOFLOXACIN 750 MG PO TABS
750.0000 mg | ORAL_TABLET | ORAL | Status: DC
  Administered 2016-05-12 – 2016-05-13 (×2): 750 mg via ORAL
  Filled 2016-05-12 (×2): qty 1

## 2016-05-12 MED ORDER — INSULIN ASPART 100 UNIT/ML ~~LOC~~ SOLN
0.0000 [IU] | Freq: Every day | SUBCUTANEOUS | Status: DC
  Administered 2016-05-12: 21:00:00 4 [IU] via SUBCUTANEOUS
  Filled 2016-05-12: qty 4

## 2016-05-12 MED ORDER — LEVOFLOXACIN 500 MG PO TABS: 500.0000 mg | ORAL_TABLET | Freq: Every day | ORAL | 0 refills | Status: DC

## 2016-05-12 MED ORDER — PREDNISONE 10 MG (21) PO TBPK: 10.0000 mg | ORAL_TABLET | Freq: Every day | ORAL | 0 refills | Status: DC

## 2016-05-12 NOTE — NC FL2 (Signed)
[place] MEDICAID FL2 LEVEL OF CARE SCREENING TOOL     IDENTIFICATION  Patient Name: Troy [name] Birthdate: 09-02-1923 Sex: male Admission Date (Current Location): 05/09/2016  [place] and [place] Number:  Chiropodist and Address:  [facility], [address] [place], [place] [address]      Provider Number: [id number]  Attending Physician Name and Address:  Katha Hamming, MD  Relative Name and Phone Number:       Current Level of Care: Hospital Recommended Level of Care: Skilled Nursing Facility Prior Approval Number:    Date Approved/Denied:   PASRR Number:  [id number])  Discharge Plan: SNF    Current Diagnoses: Patient Active Problem List   Diagnosis Date Noted  . Acute on chronic respiratory failure (HCC) 05/09/2016  . Dehydration 03/21/2016  . Hypotension 03/21/2016  . Fall 03/21/2016  . Chest wall contusion, left, initial encounter 03/21/2016  . Hypoxia 03/21/2016  . Generalized weakness 03/21/2016  . Leukocytosis 03/21/2016  . Acute on chronic renal failure (HCC) 03/19/2016  . Acute exacerbation of chronic obstructive pulmonary disease (COPD) (HCC) 12/07/2015  . Acute on chronic respiratory failure with hypoxia (HCC) 09/28/2015  . HTN (hypertension) 09/25/2015  . Parkinson's disease (HCC) 09/25/2015  . Hypothyroidism 09/25/2015  . COPD exacerbation (HCC) 09/12/2015  . Diabetes (HCC) 08/20/2015  . SSS (sick sinus syndrome) (HCC) 08/07/2015  . Chronic atrial fibrillation (HCC)   . Sinus pause   . Sick sinus syndrome (HCC)   . Acute on chronic diastolic CHF (congestive heart failure), NYHA class 1 (HCC) 08/03/2015  . Lower extremity weakness 02/25/2015    Orientation RESPIRATION BLADDER Height & Weight     Self, Time, Situation, Place  O2 (2 Liters Oxygen ) Continent Weight: 253 lb 1.6 oz (114.8 kg) Height:  5' 9.5" (176.5 cm)  BEHAVIORAL SYMPTOMS/MOOD NEUROLOGICAL BOWEL NUTRITION STATUS   (none )  (none  ) Continent Diet (Diet: Heart Healthy/ Carb Modified )  AMBULATORY STATUS COMMUNICATION OF NEEDS Skin   Extensive Assist Verbally Normal                       Personal Care Assistance Level of Assistance  Bathing, Feeding, Dressing Bathing Assistance: Limited assistance Feeding assistance: Independent Dressing Assistance: Limited assistance     Functional Limitations Info  Sight, Hearing, Speech Sight Info: Adequate Hearing Info: Adequate Speech Info: Adequate    SPECIAL CARE FACTORS FREQUENCY  PT (By licensed PT), OT (By licensed OT)     PT Frequency:  (5) OT Frequency:  (5)            Contractures      Additional Factors Info  Code Status, Allergies, Insulin Sliding Scale Code Status Info:  (DNR ) Allergies Info:  (No Known Allergies. )   Insulin Sliding Scale Info:  (NovoLog Insulin Injections )       Current Medications (05/12/2016):  This is the current hospital active medication list Current Facility-Administered Medications  Medication Dose Route Frequency Provider Last Rate Last Dose  . acetaminophen (TYLENOL) tablet 650 mg  650 mg Oral Q6H PRN Katha Hamming, MD   650 mg at 05/12/16 2029   Or  . acetaminophen (TYLENOL) suppository 650 mg  650 mg Rectal Q6H PRN Katha Hamming, MD      . albuterol (PROVENTIL) (2.5 MG/3ML) 0.083% nebulizer solution 2.5 mg  2.5 mg Inhalation Q4H Katha Hamming, MD   2.5 mg at 05/12/16 2004  . bisacodyl (DULCOLAX) EC tablet  5 mg  5 mg Oral Daily PRN Katha HammingSnehalatha Konidena, MD      . carbidopa-levodopa (SINEMET IR) 25-250 MG per tablet immediate release 1 tablet  1 tablet Oral TID Katha HammingSnehalatha Konidena, MD   1 tablet at 05/12/16 2029  . docusate sodium (COLACE) capsule 100 mg  100 mg Oral BID Katha HammingSnehalatha Konidena, MD   100 mg at 05/12/16 0852  . furosemide (LASIX) tablet 40 mg  40 mg Oral Daily Katha HammingSnehalatha Konidena, MD   40 mg at 05/12/16 0852  . glipiZIDE (GLUCOTROL XL) 24 hr tablet 5 mg  5 mg Oral Q breakfast  Katha HammingSnehalatha Konidena, MD   5 mg at 05/12/16 0852  . [START ON 05/13/2016] insulin aspart (novoLOG) injection 0-15 Units  0-15 Units Subcutaneous TID WC Katha HammingSnehalatha Konidena, MD      . insulin aspart (novoLOG) injection 0-5 Units  0-5 Units Subcutaneous QHS Katha HammingSnehalatha Konidena, MD      . Melene Muller[START ON 05/13/2016] insulin aspart (novoLOG) injection 5 Units  5 Units Subcutaneous TID WC Katha HammingSnehalatha Konidena, MD      . Melene Muller[START ON 05/13/2016] insulin glargine (LANTUS) injection 32 Units  32 Units Subcutaneous Daily Katha HammingSnehalatha Konidena, MD      . levofloxacin (LEVAQUIN) tablet 750 mg  750 mg Oral QODAY Valentina GuScott D Christy, RPH   750 mg at 05/12/16 1443  . linagliptin (TRADJENTA) tablet 5 mg  5 mg Oral Daily Katha HammingSnehalatha Konidena, MD   5 mg at 05/12/16 0855  . lisinopril (PRINIVIL,ZESTRIL) tablet 10 mg  10 mg Oral Daily Katha HammingSnehalatha Konidena, MD   10 mg at 05/12/16 2029  . methylPREDNISolone sodium succinate (SOLU-MEDROL) 125 mg/2 mL injection 60 mg  60 mg Intravenous Q24H Katha HammingSnehalatha Konidena, MD   60 mg at 05/12/16 1443  . metoprolol succinate (TOPROL-XL) 24 hr tablet 25 mg  25 mg Oral Daily Katha HammingSnehalatha Konidena, MD   25 mg at 05/12/16 2029  . mometasone-formoterol (DULERA) 200-5 MCG/ACT inhaler 2 puff  2 puff Inhalation BID Katha HammingSnehalatha Konidena, MD   2 puff at 05/12/16 2030  . ondansetron (ZOFRAN) tablet 4 mg  4 mg Oral Q6H PRN Katha HammingSnehalatha Konidena, MD       Or  . ondansetron (ZOFRAN) injection 4 mg  4 mg Intravenous Q6H PRN Katha HammingSnehalatha Konidena, MD      . polyethylene glycol (MIRALAX / GLYCOLAX) packet 17 g  17 g Oral Daily Katha HammingSnehalatha Konidena, MD   17 g at 05/12/16 0849  . potassium chloride SA (K-DUR,KLOR-CON) CR tablet 10 mEq  10 mEq Oral Daily Katha HammingSnehalatha Konidena, MD   10 mEq at 05/12/16 0851  . simethicone (MYLICON) chewable tablet 80 mg  80 mg Oral Q6H PRN Katha HammingSnehalatha Konidena, MD      . traZODone (DESYREL) tablet 25 mg  25 mg Oral QHS PRN Katha HammingSnehalatha Konidena, MD      . warfarin (COUMADIN) tablet 2 mg  2 mg Oral q1800 Valentina GuScott  D Christy, RPH   2 mg at 05/12/16 1734  . Warfarin - Pharmacist Dosing Inpatient   Does not apply q1800 Rolm BaptiseHolly N Gilliam, [facility] HospitalRPH         Discharge Medications: Please see discharge summary for a list of discharge medications.  Relevant Imaging Results:  Relevant Lab Results:   Additional Information  (SSN: [id number])  Arleen Bar, Darleen CrockerBailey M, LCSW

## 2016-05-12 NOTE — Evaluation (Signed)
Physical Therapy Evaluation Patient Details Name: Troy [name] MRN: [id number] DOB: April 28, 1924 Today's Date: 05/12/2016   History of Present Illness  presented to ER secondary to progressive weakness, AMS, hypoxia; admitted with metabolic encephalopathy secondary to bilat PNA.  Clinical Impression  Patient generally weak, requiring increased assist (compared to baseline) for all functional mobility.  Currently completes sit/stand with min/mod assist +2, and short-distance gait (35') with RW, min assist +2.  Noted deficits in dynamic balance, requiring use of RW at all times (unsafe to attempt with baseline SBQC); unable to tolerate additional distance due to fatigue/SOB.  High fall risk evident due to limited functional strength/activity tolerance. Sats maintained >90% on 2L, but does require intermittent rest periods with all functional activities. Would benefit from skilled PT to address above deficits and promote optimal return to PLOF; recommend transition to STR upon discharge from acute hospitalization.     Follow Up Recommendations SNF    Equipment Recommendations  Rolling walker with 5" wheels    Recommendations for Other Services       Precautions / Restrictions Precautions Precautions: Fall Restrictions Weight Bearing Restrictions: No      Mobility  Bed Mobility Overal bed mobility: Needs Assistance Bed Mobility: Supine to Sit     Supine to sit: Min assist        Transfers Overall transfer level: Needs assistance Equipment used: Rolling walker (2 wheeled) Transfers: Sit to/from Stand Sit to Stand: Min assist;Mod assist;+2 safety/equipment         General transfer comment: min assist +2 from bed surface, mod assist +2 from recliner seat (lower surface)  Ambulation/Gait Ambulation/Gait assistance: Min assist;Mod assist Ambulation Distance (Feet): 35 Feet Assistive device: Rolling walker (2 wheeled)       General Gait Details: forward flexed  posture, short/choppy steps with poor balance reaction; slight steppage gait with noted decrease in balance reactions.  Additional distance limited by fatigue, BORG 8/10 with above distance (though sats remain >90% on 2L)  Stairs            Wheelchair Mobility    Modified Rankin (Stroke Patients Only)       Balance Overall balance assessment: Needs assistance Sitting-balance support: No upper extremity supported;Feet supported Sitting balance-Leahy Scale: Good     Standing balance support: Bilateral upper extremity supported Standing balance-Leahy Scale: Fair                               Pertinent Vitals/Pain Pain Assessment: No/denies pain    Home Living Family/patient expects to be discharged to:: Private residence Living Arrangements: Spouse/significant other Available Help at Discharge: Family;Personal care attendant;Available PRN/intermittently Type of Home: House Home Access: Stairs to enter Entrance Stairs-Rails: Left Entrance Stairs-Number of Steps: 3 Home Layout: One level        Prior Function Level of Independence: Needs assistance   Gait / Transfers Assistance Needed: Pt SBA with amb household distances using a QC or a RW and used a w/c in the community, current fall was only fall in last year.  ADL's / Homemaking Assistance Needed: Personal caregiver assisted with ADLs, 8 hours/day, 4 days/week        Hand Dominance        Extremity/Trunk Assessment   Upper Extremity Assessment Upper Extremity Assessment: Overall WFL for tasks assessed    Lower Extremity Assessment Lower Extremity Assessment: Generalized weakness (R LE grossly 4/5, L LE grossly 4-/5)  Communication   Communication: No difficulties  Cognition Arousal/Alertness: Awake/alert Behavior During Therapy: WFL for tasks assessed/performed Overall Cognitive Status: Within Functional Limits for tasks assessed                      General Comments       Exercises Other Exercises Other Exercises: Sit/stand from various surface heights: min assist +2 from bed height, mod assist +2 from recliner height Other Exercises: Static stance for LE clothing management, min assist with RW  Supine LE therex, 1x10, AROM for muscular strength/endurnace with all functional activities.  Cuing for pursed lip breathing throughout.   Assessment/Plan    PT Assessment Patient needs continued PT services  PT Problem List Decreased strength;Decreased range of motion;Decreased balance;Decreased activity tolerance;Decreased mobility;Decreased coordination;Decreased cognition;Decreased knowledge of use of DME;Decreased safety awareness;Decreased knowledge of precautions;Cardiopulmonary status limiting activity          PT Treatment Interventions DME instruction;Gait training;Stair training;Functional mobility training;Therapeutic activities;Therapeutic exercise;Balance training;Patient/family education    PT Goals (Current goals can be found in the Care Plan section)  Acute Rehab PT Goals Patient Stated Goal: to get stronger and return home PT Goal Formulation: With patient/family Time For Goal Achievement: 05/26/16 Potential to Achieve Goals: Fair    Frequency Min 2X/week   Barriers to discharge        Co-evaluation               End of Session Equipment Utilized During Treatment: Gait belt Activity Tolerance: Patient limited by fatigue Patient left: in chair;with call bell/phone within reach;with chair alarm set Nurse Communication: Mobility status         Time: 1610-9604 PT Time Calculation (min) (ACUTE ONLY): 41 min   Charges:   PT Evaluation $PT Eval Moderate Complexity: 1 Procedure PT Treatments $Therapeutic Exercise: 8-22 mins $Therapeutic Activity: 8-22 mins   PT G Codes:       Cherokee Clowers H. Manson Passey, PT, DPT, NCS 05/12/16, 5:42 PM [phone]

## 2016-05-12 NOTE — Progress Notes (Signed)
Inpatient Diabetes Program Recommendations  AACE/ADA: New Consensus Statement on Inpatient Glycemic Control (2015)  Target Ranges:  Prepandial:   less than 140 mg/dL      Peak postprandial:   less than 180 mg/dL (1-2 hours)      Critically ill patients:  140 - 180 mg/dL    Review of Glycemic Control  Diabetes history: DM2 Outpatient Diabetes medications: Humulin R U500 (concentrated insulin) 5 units QAM, Humulin R U500 3 units QPM, Lantus 10-15 units QHS, Glipizide XL 5 mg QAM, Onglyza 5 mg daily Current orders for Inpatient glycemic control: Lantus 28 units daily, Novolog 0-9 units TID with meals, Tradjenta 5 mg daily, Glipizide XL 5 mg QAM  Inpatient Diabetes Program Recommendations: Insulin - Basal: If steroids continued as ordered, please consider increasing Lantus to 32 units daily (starting now). Insulin - Meal Coverage: If steroids continued as ordered, please consider ordering Novolog 5 units TID with meals for meal coverage if patient eats at least 50% of meals. Insulin-Correction: Please consider increasing Novolog to Moderate correction scale and adding Novolog bedtime correction scale as well.  Thanks, Orlando PennerMarie Kasch Borquez, RN, MSN, CDE Diabetes Coordinator Inpatient Diabetes Program [phone] (Team Pager from 8am to 5pm)

## 2016-05-12 NOTE — Progress Notes (Signed)
[facility] [place] [facility]   PATIENT NAME: Troy [name]    MR#:  [id number]  DATE OF BIRTH:  February 14, 1924  SUBJECTIVE pt feels better.no complaints,PT recommends SNF placement.less wheezing today.  CHIEF COMPLAINT:   Chief Complaint  Patient presents with  . Weakness  . Altered Mental Status    REVIEW OF SYSTEMS:   Review of Systems  Constitutional: Negative for chills and fever.  HENT: Negative for hearing loss.   Eyes: Negative for blurred vision, double vision and photophobia.  Respiratory: Negative for cough, hemoptysis, shortness of breath and wheezing.   Cardiovascular: Negative for palpitations, orthopnea and leg swelling.  Gastrointestinal: Negative for abdominal pain, diarrhea and vomiting.  Genitourinary: Negative for dysuria and urgency.  Musculoskeletal: Negative for myalgias and neck pain.  Skin: Negative for rash.  Neurological: Negative for dizziness, focal weakness, seizures, weakness and headaches.  Psychiatric/Behavioral: Negative for memory loss. The patient does not have insomnia.     DRUG ALLERGIES:  No Known Allergies  VITALS:  Blood pressure 136/65, pulse 78, temperature 97.6 F (36.4 C), temperature source Oral, resp. rate 20, height 5' 9.5" (1.765 m), weight 114.8 kg (253 lb 1.6 oz), SpO2 98 %.  PHYSICAL EXAMINATION:  GENERAL:  83 y.o.-year-old patient lying in the bed with no acute distress.  EYES: Pupils equal, round, reactive to light and accommodation. No scleral icterus. Extraocular muscles intact.  HEENT: Head atraumatic, normocephalic. Oropharynx and nasopharynx clear.  NECK:  Supple, no jugular venous distention. No thyroid enlargement, no tenderness.  LUNGS: faint wheeze bilaterally.   CARDIOVASCULAR: S1, S2 normal. No murmurs, rubs, or gallops.  ABDOMEN: Soft, nontender, nondistended. Bowel sounds present. No organomegaly or mass.  EXTREMITIES: No pedal edema, cyanosis, or clubbing.  NEUROLOGIC:  Cranial nerves II through XII are intact. Muscle strength 5/5 in all extremities. Sensation intact. Gait not checked.  PSYCHIATRIC: The patient is alert and oriented x 3.  SKIN: No obvious rash, lesion, or ulcer.    LABORATORY PANEL:   CBC  Recent Labs Lab 05/10/16 0508  WBC 9.1  HGB 13.2  HCT 39.4*  PLT 154   ------------------------------------------------------------------------------------------------------------------  Chemistries   Recent Labs Lab 05/09/16 1311 05/10/16 0508  NA 135 136  K 4.2 4.7  CL 101 101  CO2 31 26  GLUCOSE 395* 457*  BUN 21* 30*  CREATININE 1.21 1.30*  CALCIUM 8.9 8.5*  AST 21  --   ALT 6*  --   ALKPHOS 69  --   BILITOT 0.9  --    ------------------------------------------------------------------------------------------------------------------  Cardiac Enzymes  Recent Labs Lab 05/09/16 1311  TROPONINI 0.03*   ------------------------------------------------------------------------------------------------------------------  RADIOLOGY:  Ct Abdomen Pelvis Wo Contrast  Result Date: 05/11/2016 CLINICAL DATA:  83 y/o M; 1 month of increasing abdominal distention. History of hernia repair. EXAM: CT ABDOMEN AND PELVIS WITHOUT CONTRAST TECHNIQUE: Multidetector CT imaging of the abdomen and pelvis was performed following the standard protocol without IV contrast. COMPARISON:  03/19/2016 CT abdomen and pelvis FINDINGS: Lower chest: Partially visualized pacemaking leads. Trace bilateral pleural effusions. Minor bibasilar atelectasis of the lungs. Hepatobiliary: No focal liver abnormality is seen. Status post cholecystectomy. No biliary dilatation. Pancreas: Unremarkable. No pancreatic ductal dilatation or surrounding inflammatory changes. Spleen: Normal in size without focal abnormality. Adrenals/Urinary Tract: Multiple well-circumscribed homogeneous low-attenuation lesions of the kidneys by lateral measuring up to 76 mm at the left kidney lower  pole consistent with multiple renal cysts. Some demonstrate intermediate attenuation and are likely hemorrhagic cysts. No urinary  stone disease or obstructive uropathy. Mild heterogeneous irregularity of the wall of the bladder may be due to hypertrophy from outflow obstruction. Stomach/Bowel: Stomach is within normal limits. Appendix appears normal. No evidence of bowel wall thickening, distention, or inflammatory changes. Extensive sigmoid diverticulosis without evidence of diverticulitis. Vascular/Lymphatic: Aortic atherosclerosis. No enlarged abdominal or pelvic lymph nodes. Reproductive: Mild prostate hypertrophy with calcifications. Other: No abdominal wall hernia or abnormality. No abdominopelvic ascites. Musculoskeletal: There is an electrode within the left aspect of the sacrum at the S2-3 level with device in the left flank soft tissues. Multilevel degenerative changes of the thoracic and lumbar spine with prominent lower lumbar facet arthropathy and grade 1 degenerative L4-5 anterolisthesis is stable.Mild osteoarthrosis of the hip joints bilaterally as well as the symphysis pubis. Mild lumbar levocurvature. No acute osseous abnormality is evident. Chronic left lower rib fractures. 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 IMPRESSION: 1. No acute process identified. 2. Trace bilateral pleural effusions. 3. Sigmoid diverticulosis without evidence of diverticulitis. 4. Aortic atherosclerosis. 5. Thoracolumbar spinal degenerative changes are stable. Electronically Signed   By: Mitzi HansenLance  Furusawa-Stratton M.D.   On: 05/11/2016 01:16    EKG:   Orders placed or performed during the hospital encounter of 05/09/16  . ED EKG  . ED EKG  . EKG 12-Lead  . EKG 12-Lead    ASSESSMENT AND PLAN:   83 year old  Male  patient with generalized weakness, altered mental status found to have bilateral pneumonia. Admitted to hospitalist service, started on IV antibiotics. Continue levaquin.  continue   oxygen, patient on 2 L of oxygen at home.  Lot of wheezing today, continue steroids, oxygen, nebulizers every 4 hours instead of when necessary.  #2 mild dehydration mild acute kidney injury:   #3 mildly elevated troponins without any chest pain secondary to shortness of breath  #4 metabolic encephalopathy secondary to pneumonia. Resolved.   5 chronic atrial fibrillation: Rate controlled continue warfarin, Toprol X Pharmacy to manage manage anticoagulation. . #6 diabetes mellitus type 2: Patient on Tradjenta,glucotrol,lantus.dose of lantus increase because BG more than 300 at times.change sliding scale insulin also to moderate scale.  All the records are reviewed and case discussed with Care Management/Social Workerr. Management plans discussed with the patient, family and they are in agreement.   CODE STATUS: DO NOT RESUSCITATE  TOTAL TIME TAKING CARE OF THIS PATIENT: 35minutes.   POSSIBLE D/C IN 1-2 DAYS, DEPENDING ON CLINICAL CONDITION.   Katha HammingKONIDENA,[place] Broadwell M.D on 05/12/2016 at 5:39 PM  Between 7am to 6pm - Pager - [phone]  After 6pm go to [email or web address] - password [email or web address]  [facility]  Office  [phone]  CC: Primary care physician; Barbette ReichmannHANDE,VISHWANATH, MD   Note: This dictation was prepared with Dragon dictation along with smaller phrase technology. Any transcriptional errors that result from this process are unintentional.

## 2016-05-12 NOTE — Care Management Important Message (Signed)
Important Message  Patient Details  Name: Troy BurnRobert [name] MRN: [id number] Date of Birth: Mar 27, 1924   Medicare Important Message Given:  Yes    Gwenette GreetBrenda S Blyss Lugar, RN 05/12/2016, 10:39 AM

## 2016-05-12 NOTE — Progress Notes (Signed)
ANTICOAGULATION CONSULT NOTE - Initial Consult  Pharmacy Consult for warfarin dosing Indication: chronic afib, mitral valve replacement  No Known Allergies  Patient Measurements: Height: 5' 9.5" (176.5 cm) Weight: 253 lb 1.6 oz (114.8 kg) IBW/kg (Calculated) : 71.85  Vital Signs: Temp: 97.6 F (36.4 C) (01/08 0505) Temp Source: Oral (01/08 0505) BP: 126/70 (01/08 0845) Pulse Rate: 68 (01/08 0845)  Labs:  Recent Labs  05/09/16 1311  05/10/16 0508 05/11/16 0528 05/12/16 0436  HGB 13.5  --  13.2  --   --   HCT 40.3  --  39.4*  --   --   PLT 147*  --  154  --   --   LABPROT  --   < > 34.3* 38.8* 31.7*  INR  --   < > 3.30 3.85 2.99  CREATININE 1.21  --  1.30*  --   --   TROPONINI 0.03*  --   --   --   --   < > = values in this interval not displayed.  Estimated Creatinine Clearance: 47.6 mL/min (by C-G formula based on SCr of 1.3 mg/dL (H)).   Infusions:    Assessment: Pharmacy consulted to dose warfarin in this [age] yoM admitted with AMS and bilateral PNA. Pt has afib and takes warfarin every evening PTA. Per med rec, pt takes warfarin 3 mg Monday, Wednesday, Friday and 5 mg Tuesday, Thursday, Saturday and Sunday. Patient is on abx that may potentiate INR.   Date:  INR:  Dose: 1/5  3.26  1.5 mg 1/6  3.30  2.5mg  1/7  3.85  HOLD 1/8  2.99     Goal of Therapy:  INR goal 2-3 Monitor platelets by anticoagulation protocol: Yes   Plan:  INR now in range, but remains on Levaquin which may increase INR. Will order warfarin 2 mg daily and f/u AM.   Luisa HartScott Gerhard Rappaport, PharmD Clinical Pharmacist  05/12/2016 10:26 AM

## 2016-05-12 NOTE — Progress Notes (Signed)
PHARMACIST - PHYSICIAN COMMUNICATION DR:   Luberta MutterKonidena CONCERNING: Antibiotic IV to Oral Route Change Policy  RECOMMENDATION: This patient is receiving levofloxacin by the intravenous route.  Based on criteria approved by the Pharmacy and Therapeutics Committee, the antibiotic(s) is/are being converted to the equivalent oral dose form(s).   DESCRIPTION: These criteria include:  Patient being treated for a respiratory tract infection, urinary tract infection, cellulitis or clostridium difficile associated diarrhea if on metronidazole  The patient is not neutropenic and does not exhibit a GI malabsorption state  The patient is eating (either orally or via tube) and/or has been taking other orally administered medications for a least 24 hours  The patient is improving clinically and has a Tmax < 100.5  If you have questions about this conversion, please contact the Pharmacy Department  []   [phone]( [phone] )  [facility] [x]   [phone]( [phone] )  [facility] []   [phone]( [phone] )  [facility] []   [phone]  [facility] []   [phone]( [phone] )  [facility]   Luisa HartScott Kobi Aller, PharmD Clinical Pharmacist

## 2016-05-12 NOTE — Care Management (Signed)
Admitted to this facility with the diagnosis of acute on chronic respiratory distress. Lives with wife, Rubin Payordith [phone]). Last seen Dr. Marcello FennelHande in December 2017. Pres[facility]. No home Health in place at this time. Followed by [facility] until 04/08/16. Home oxygen per [facility] x 3 years. 2 liters as needed mostly at night. Last fall was in December. Great appetite. Grab bars, rolling walker, cane, wheelchair, commode seat, suction machine, and  hospital bed   In the home. Personal care service Monday - Friday 8:00am-4:00pm, private pay. Wife and caregiver at the bedside. Gwenette GreetBrenda S Angelic Schnelle RN MSN CCM Care Management

## 2016-05-12 NOTE — Clinical Social Work Placement (Signed)
   CLINICAL SOCIAL WORK PLACEMENT  NOTE  Date:  05/12/2016  Patient Details  Name: Dannielle BurnRobert S Sandles MRN: [id number] Date of Birth: 06/10/1923  Clinical Social Work is seeking post-discharge placement for this patient at the Skilled  Nursing Facility level of care (*CSW will initial, date and re-position this form in  chart as items are completed):  Yes   Patient/family provided with [place] Clinical Social Work Department's list of facilities offering this level of care within the geographic area requested by the patient (or if unable, by the patient's family).  Yes   Patient/family informed of their freedom to choose among providers that offer the needed level of care, that participate in Medicare, Medicaid or managed care program needed by the patient, have an available bed and are willing to accept the patient.  Yes   Patient/family informed of [place]'s ownership interest in [facility] and [facility], as well as of the fact that they are under no obligation to receive care at these facilities.  PASRR submitted to EDS on       PASRR number received on       Existing PASRR number confirmed on 05/12/16     FL2 transmitted to all facilities in geographic area requested by pt/family on 05/12/16     FL2 transmitted to all facilities within larger geographic area on       Patient informed that his/her managed care company has contracts with or will negotiate with certain facilities, including the following:            Patient/family informed of bed offers received.  Patient chooses bed at       Physician recommends and patient chooses bed at      Patient to be transferred to   on  .  Patient to be transferred to facility by       Patient family notified on   of transfer.  Name of family member notified:        PHYSICIAN       Additional Comment:    _______________________________________________ Marleah Beever, Darleen CrockerBailey M, LCSW 05/12/2016, 9:16 PM

## 2016-05-12 NOTE — Clinical Social Work Note (Signed)
Clinical Social Work Assessment  Patient Details  Name: Troy [name] MRN: [id number] Date of Birth: 11/21/1923  Date of referral:  05/12/16               Reason for consult:  Facility Placement                Permission sought to share information with:  Chartered certified accountant granted to share information::  Yes, Verbal Permission Granted  Name::      [place]::   [place]   Relationship::     Contact Information:     Housing/Transportation Living arrangements for the past 2 months:  [place] of Information:  Adult Children, Spouse Patient Interpreter Needed:  None Criminal Activity/Legal Involvement Pertinent to Current Situation/Hospitalization:  No - Comment as needed Significant Relationships:  Spouse Lives with:  Spouse Do you feel safe going back to the place where you live?  Yes Need for family participation in patient care:  Yes (Comment)  Care giving concerns:  Patient lives with his wife Olegario Shearer in [place].    Social Worker assessment / plan:  Holiday representative (CSW) received SNF consult. PT is recommending SNF. CSW met with patient and his wife Olegario Shearer and caregiver were at bedside. Patient was doing a breathing treatment during assessment. Wife reported that patient has a caregiver Monday through Friday 8-4. Wife and patient are agreeable to SNF search. Patient prefers [facility]. FL2 complete and faxed out. CSW will continue to follow and assist as needed.   Employment status:  Disabled (Comment on whether or not currently receiving Disability) Insurance information:  Managed Medicare PT Recommendations:    Information / Referral to community resources:  [place]  Patient/Family's Response to care:  Patient and wife are ageeable to [employer] in [place].   Patient/Family's Understanding of and Emotional Response to Diagnosis, Current Treatment, and Prognosis: Patient  and wife were very pleasant and thanked CSW for visit.   Emotional Assessment Appearance:  Appears stated age Attitude/Demeanor/Rapport:    Affect (typically observed):  Accepting, Adaptable, Pleasant Orientation:  Oriented to Self, Oriented to Place, Oriented to  Time, Oriented to Situation Alcohol / Substance use:  Not Applicable Psych involvement (Current and /or in the community):  No (Comment)  Discharge Needs  Concerns to be addressed:  Discharge Planning Concerns Readmission within the last 30 days:  No Current discharge risk:  Dependent with Mobility Barriers to Discharge:  Continued Medical Work up   [employer], Veronia Beets, LCSW 05/12/2016, 9:19 PM

## 2016-05-12 NOTE — Progress Notes (Signed)
Pt BS 433 at HS, MD notified as no HS insulin coverage on MAR. Order placed to use same sliding scale coverage as meal time. 9 units Novolog insulin admin.

## 2016-05-13 ENCOUNTER — Telehealth: Payer: Self-pay | Admitting: Cardiology

## 2016-05-13 ENCOUNTER — Encounter: Payer: Medicare Other | Admitting: *Deleted

## 2016-05-13 LAB — GLUCOSE, CAPILLARY
GLUCOSE-CAPILLARY: 302 mg/dL — AB (ref 65–99)
Glucose-Capillary: 170 mg/dL — ABNORMAL HIGH (ref 65–99)

## 2016-05-13 LAB — PROTIME-INR
INR: 2.23
PROTHROMBIN TIME: 25.1 s — AB (ref 11.4–15.2)

## 2016-05-13 MED ORDER — INSULIN GLARGINE 100 UNIT/ML ~~LOC~~ SOLN: 32.0000 [IU] | Freq: Every day | SUBCUTANEOUS | 11 refills | Status: DC

## 2016-05-13 MED ORDER — WARFARIN SODIUM 3 MG PO TABS: 3.0000 mg | ORAL_TABLET | Freq: Once | ORAL | Status: DC

## 2016-05-13 MED ORDER — WARFARIN SODIUM 2 MG PO TABS: 2.0000 mg | ORAL_TABLET | Freq: Every day | ORAL | 0 refills | Status: DC

## 2016-05-13 MED ORDER — INSULIN ASPART 100 UNIT/ML ~~LOC~~ SOLN: 0.0000 [IU] | Freq: Every day | SUBCUTANEOUS | 11 refills | Status: AC

## 2016-05-13 MED ORDER — LEVOFLOXACIN 750 MG PO TABS: 750.0000 mg | ORAL_TABLET | ORAL | 0 refills | Status: DC

## 2016-05-13 MED ORDER — INSULIN ASPART 100 UNIT/ML ~~LOC~~ SOLN: 5.0000 [IU] | Freq: Three times a day (TID) | SUBCUTANEOUS | 11 refills | Status: DC

## 2016-05-13 MED ORDER — INSULIN ASPART 100 UNIT/ML ~~LOC~~ SOLN: 0.0000 [IU] | Freq: Three times a day (TID) | SUBCUTANEOUS | 11 refills | Status: DC

## 2016-05-13 MED ORDER — ALBUTEROL SULFATE (2.5 MG/3ML) 0.083% IN NEBU: 2.5000 mg | INHALATION_SOLUTION | Freq: Four times a day (QID) | RESPIRATORY_TRACT | 12 refills | Status: AC | PRN

## 2016-05-13 MED ORDER — PREDNISONE 10 MG (21) PO TBPK: 10.0000 mg | ORAL_TABLET | Freq: Every day | ORAL | 0 refills | Status: DC

## 2016-05-13 MED ORDER — WARFARIN SODIUM 2 MG PO TABS: 4.0000 mg | ORAL_TABLET | Freq: Once | ORAL | Status: DC

## 2016-05-13 NOTE — Telephone Encounter (Signed)
LMOVM reminding pt to send remote transmission.   

## 2016-05-13 NOTE — NC FL2 (Signed)
[place] MEDICAID FL2 LEVEL OF CARE SCREENING TOOL     IDENTIFICATION  Patient Name: LEJUAN BOTTO Birthdate: 05/06/1923 Sex: male Admission Date (Current Location): 05/09/2016  [place] and [place] Number:  Chiropodist and Address:  [facility], [address], [place], [place] [address]      Provider Number: [id number]  Attending Physician Name and Address:  Katha Hamming, MD  Relative Name and Phone Number:       Current Level of Care: Hospital Recommended Level of Care: Skilled Nursing Facility Prior Approval Number:    Date Approved/Denied:   PASRR Number:  [id number])  Discharge Plan: SNF    Current Diagnoses: Patient Active Problem List   Diagnosis Date Noted  . Acute on chronic respiratory failure (HCC) 05/09/2016  . Dehydration 03/21/2016  . Hypotension 03/21/2016  . Fall 03/21/2016  . Chest wall contusion, left, initial encounter 03/21/2016  . Hypoxia 03/21/2016  . Generalized weakness 03/21/2016  . Leukocytosis 03/21/2016  . Acute on chronic renal failure (HCC) 03/19/2016  . Acute exacerbation of chronic obstructive pulmonary disease (COPD) (HCC) 12/07/2015  . Acute on chronic respiratory failure with hypoxia (HCC) 09/28/2015  . HTN (hypertension) 09/25/2015  . Parkinson's disease (HCC) 09/25/2015  . Hypothyroidism 09/25/2015  . COPD exacerbation (HCC) 09/12/2015  . Diabetes (HCC) 08/20/2015  . SSS (sick sinus syndrome) (HCC) 08/07/2015  . Chronic atrial fibrillation (HCC)   . Sinus pause   . Sick sinus syndrome (HCC)   . Acute on chronic diastolic CHF (congestive heart failure), NYHA class 1 (HCC) 08/03/2015  . Lower extremity weakness 02/25/2015    Orientation RESPIRATION BLADDER Height & Weight     Self, Time, Situation, Place  O2 (2 Liters Oxygen ) Continent Weight: 253 lb 1.6 oz (114.8 kg) Height:  5' 9.5" (176.5 cm)  BEHAVIORAL SYMPTOMS/MOOD NEUROLOGICAL BOWEL NUTRITION STATUS   (none )  (none  ) Continent Diet (Diet: Heart Healthy/ Carb Modified )  AMBULATORY STATUS COMMUNICATION OF NEEDS Skin   Extensive Assist Verbally Normal                       Personal Care Assistance Level of Assistance  Bathing, Feeding, Dressing Bathing Assistance: Limited assistance Feeding assistance: Independent Dressing Assistance: Limited assistance     Functional Limitations Info  Sight, Hearing, Speech Sight Info: Adequate Hearing Info: Adequate Speech Info: Adequate    SPECIAL CARE FACTORS FREQUENCY  PT (By licensed PT), OT (By licensed OT)     PT Frequency:  (5) OT Frequency:  (5)            Contractures      Additional Factors Info  Code Status, Allergies, Insulin Sliding Scale Code Status Info:  (DNR ) Allergies Info:  (No Known Allergies. )   Insulin Sliding Scale Info:  (NovoLog Insulin Injections )       Current Medications (05/13/2016):  This is the current hospital active medication list Current Facility-Administered Medications  Medication Dose Route Frequency Provider Last Rate Last Dose  . acetaminophen (TYLENOL) tablet 650 mg  650 mg Oral Q6H PRN Katha Hamming, MD   650 mg at 05/12/16 2029   Or  . acetaminophen (TYLENOL) suppository 650 mg  650 mg Rectal Q6H PRN Katha Hamming, MD      . albuterol (PROVENTIL) (2.5 MG/3ML) 0.083% nebulizer solution 2.5 mg  2.5 mg Inhalation Q4H Katha Hamming, MD   2.5 mg at 05/13/16 1138  . bisacodyl (DULCOLAX) EC tablet  5 mg  5 mg Oral Daily PRN Katha HammingSnehalatha Konidena, MD      . carbidopa-levodopa (SINEMET IR) 25-250 MG per tablet immediate release 1 tablet  1 tablet Oral TID Katha HammingSnehalatha Konidena, MD   1 tablet at 05/13/16 0816  . docusate sodium (COLACE) capsule 100 mg  100 mg Oral BID Katha HammingSnehalatha Konidena, MD   100 mg at 05/13/16 0815  . furosemide (LASIX) tablet 40 mg  40 mg Oral Daily Katha HammingSnehalatha Konidena, MD   40 mg at 05/13/16 0815  . glipiZIDE (GLUCOTROL XL) 24 hr tablet 5 mg  5 mg Oral Q breakfast  Katha HammingSnehalatha Konidena, MD   5 mg at 05/13/16 0815  . insulin aspart (novoLOG) injection 0-15 Units  0-15 Units Subcutaneous TID WC Katha HammingSnehalatha Konidena, MD   3 Units at 05/13/16 1235  . insulin aspart (novoLOG) injection 0-5 Units  0-5 Units Subcutaneous QHS Katha HammingSnehalatha Konidena, MD   4 Units at 05/12/16 2116  . insulin aspart (novoLOG) injection 5 Units  5 Units Subcutaneous TID WC Katha HammingSnehalatha Konidena, MD   5 Units at 05/13/16 1236  . insulin glargine (LANTUS) injection 32 Units  32 Units Subcutaneous Daily Katha HammingSnehalatha Konidena, MD   32 Units at 05/13/16 [phone]  . levofloxacin (LEVAQUIN) tablet 750 mg  750 mg Oral QODAY Valentina GuScott D Christy, RPH   750 mg at 05/13/16 0816  . linagliptin (TRADJENTA) tablet 5 mg  5 mg Oral Daily Katha HammingSnehalatha Konidena, MD   5 mg at 05/13/16 0815  . lisinopril (PRINIVIL,ZESTRIL) tablet 10 mg  10 mg Oral Daily Katha HammingSnehalatha Konidena, MD   10 mg at 05/13/16 0816  . methylPREDNISolone sodium succinate (SOLU-MEDROL) 125 mg/2 mL injection 60 mg  60 mg Intravenous Q24H Katha HammingSnehalatha Konidena, MD   60 mg at 05/12/16 1443  . metoprolol succinate (TOPROL-XL) 24 hr tablet 25 mg  25 mg Oral Daily Katha HammingSnehalatha Konidena, MD   25 mg at 05/13/16 0816  . mometasone-formoterol (DULERA) 200-5 MCG/ACT inhaler 2 puff  2 puff Inhalation BID Katha HammingSnehalatha Konidena, MD   2 puff at 05/13/16 0820  . ondansetron (ZOFRAN) tablet 4 mg  4 mg Oral Q6H PRN Katha HammingSnehalatha Konidena, MD       Or  . ondansetron (ZOFRAN) injection 4 mg  4 mg Intravenous Q6H PRN Katha HammingSnehalatha Konidena, MD      . polyethylene glycol (MIRALAX / GLYCOLAX) packet 17 g  17 g Oral Daily Katha HammingSnehalatha Konidena, MD   17 g at 05/13/16 0815  . potassium chloride SA (K-DUR,KLOR-CON) CR tablet 10 mEq  10 mEq Oral Daily Katha HammingSnehalatha Konidena, MD   10 mEq at 05/13/16 0816  . simethicone (MYLICON) chewable tablet 80 mg  80 mg Oral Q6H PRN Katha HammingSnehalatha Konidena, MD      . traZODone (DESYREL) tablet 25 mg  25 mg Oral QHS PRN Katha HammingSnehalatha Konidena, MD      . warfarin (COUMADIN)  tablet 3 mg  3 mg Oral ONCE-1800 Sheema M Hallaji, RPH      . Warfarin - Pharmacist Dosing Inpatient   Does not apply q1800 Rolm BaptiseHolly N Gilliam, [facility]         Discharge Medications: Please see discharge summary for a list of discharge medications.  Relevant Imaging Results:  Relevant Lab Results:   Additional Information  (SSN: [id number])  Ralene BatheMackenzie Melbert Botelho, Student-Social Work

## 2016-05-13 NOTE — Progress Notes (Signed)
Pt to be discharged to [facility]  Via ems. Alert. No resp distress.  Sl d/cd.  Report [facility]eth [facility]elly [facility] . Ems  Called to transport pt. Family informed.

## 2016-05-13 NOTE — Progress Notes (Signed)
Patient is medically stable for discharge to [facility] today. Per Rich admissions coordinator at [facility], patient can come to [facility]. Social work Theatre manager sent D/C orders in [place]. Social work Theatre manager met with patient and patient's wife at bedside and explained that patient will D/C today to [facility]. Patient and patient's wife verbally agreed they understood. RN will call report and arrange EMS for transport. Please re-consult of future social work needs arise. CSW signing off.   Danie Chandler, Social Work Intern  [phone]

## 2016-05-13 NOTE — Progress Notes (Signed)
ANTIBIOTIC CONSULT NOTE - F/U  Pharmacy  Levaquin  Indication: pneumonia  No Known Allergies  Patient Measurements: Height: 5' 9.5" (176.5 cm) Weight: 253 lb 1.6 oz (114.8 kg) IBW/kg (Calculated) : 71.85 Adjusted Body Weight: 88.6 kg   Vital Signs: Temp: 97.9 F (36.6 C) (01/09 0446) Temp Source: Oral (01/09 0446) BP: 137/81 (01/09 0446) Pulse Rate: 68 (01/09 0446)  Labs: No results for input(s): WBC, HGB, PLT, LABCREA, CREATININE in the last 72 hours. Estimated Creatinine Clearance: 47.6 mL/min (by C-G formula based on SCr of 1.3 mg/dL (H)). No results for input(s): VANCOTROUGH, VANCOPEAK, VANCORANDOM, GENTTROUGH, GENTPEAK, GENTRANDOM, TOBRATROUGH, TOBRAPEAK, TOBRARND, AMIKACINPEAK, AMIKACINTROU, AMIKACIN in the last 72 hours.   Microbiology: Recent Results (from the past 720 hour(s))  MRSA PCR Screening     Status: Abnormal   Collection Time: 05/09/16  7:37 PM  Result Value Ref Range Status   MRSA by PCR (A) NEGATIVE Final    INVALID, UNABLE TO DETERMINE THE PRESENCE OF TARGET DNA DUE TO SPECIMEN INTEGRITY. RECOLLECTION REQUESTED.    Comment:        The GeneXpert MRSA Assay (FDA approved for NASAL specimens only), is one component of a comprehensive MRSA colonization surveillance program. It is not intended to diagnose MRSA infection nor to guide or monitor treatment for MRSA infections. C/DRUSILLA JACKSON AT 2248 05/09/16.PMH   MRSA PCR Screening     Status: Abnormal   Collection Time: 05/10/16  5:20 AM  Result Value Ref Range Status   MRSA by PCR (A) NEGATIVE Final    INVALID, UNABLE TO DETERMINE THE PRESENCE OF TARGET DNA DUE TO SPECIMEN INTEGRITY. RECOLLECTION REQUESTED.    Comment: RESULT CALLED TO, READ BACK BY AND VERIFIED WITH: CHRIS BENNETT AT 8:09 ON 05/10/2016 JLJ   MRSA PCR Screening     Status: None   Collection Time: 05/10/16 10:27 AM  Result Value Ref Range Status   MRSA by PCR NEGATIVE NEGATIVE Final    Comment:        The GeneXpert MRSA Assay  (FDA approved for NASAL specimens only), is one component of a comprehensive MRSA colonization surveillance program. It is not intended to diagnose MRSA infection nor to guide or monitor treatment for MRSA infections.     Medical History: Past Medical History:  Diagnosis Date  . Chronic atrial fibrillation (HCC)    a. on Couamdin; b. CHADS2VASc at least 7 (CHF, HTN, age x 2, DM, stroke x 2); c. s/p MAZE 1999  . Chronic systolic CHF (congestive heart failure) (HCC)    a. echo 08/01/2015: EF of 45%, mild LVH, mitral valve with annular calcification, mitral valve partially mobile  . CKD (chronic kidney disease), stage III   . COPD (chronic obstructive pulmonary disease) (HCC)   . Diabetes mellitus without complication (HCC)   . Hypertension   . Hypothyroidism   . Legally blind   . Mitral valve prolapse    a. s/p mitral valve repair 1999  . Normal coronary arteries    a. by cardiac cath 1999  . Parkinson's disease (HCC)   . Stroke (HCC)   . TIA (transient ischemic attack)   . UTI (lower urinary tract infection)     Medications:  Prescriptions Prior to Admission  Medication Sig Dispense Refill Last Dose  . albuterol (PROVENTIL HFA) 108 (90 Base) MCG/ACT inhaler Inhale 2 puffs into the lungs every 4 (four) hours as needed for wheezing or shortness of breath. 1 Inhaler 0 prn at prn  . albuterol (PROVENTIL) (2.5  MG/3ML) 0.083% nebulizer solution Take 2.5 mg by nebulization 3 (three) times daily.    prn at prn  . carbidopa-levodopa (SINEMET IR) 25-250 MG tablet Take 1 tablet by mouth 3 (three) times daily.   05/09/2016 at 0900  . cetirizine (ZYRTEC) 10 MG tablet Take 10 mg by mouth daily.   05/09/2016 at 0900  . furosemide (LASIX) 40 MG tablet Take 1 tablet (40 mg total) by mouth daily. 30 tablet 6 05/09/2016 at 0900  . glipiZIDE (GLUCOTROL XL) 5 MG 24 hr tablet Take 1 tablet (5 mg total) by mouth daily with breakfast. (Patient taking differently: Take 5 mg by mouth daily with breakfast.  10mg  in the morning. 5mg  at night) 30 tablet 0 05/09/2016 at 0900  . insulin glargine (LANTUS) 100 UNIT/ML injection Inject 0.24 mLs (24 Units total) into the skin daily. (Patient taking differently: Inject 10-15 Units into the skin at bedtime. ) 10 mL 0 05/08/2016 at 1900  . insulin regular human CONCENTRATED (HUMULIN R) 500 UNIT/ML injection Inject 3-5 Units into the skin 2 (two) times daily with a meal. 5 units every morning and 3 units every evening   05/09/2016 at 0900  . lisinopril (PRINIVIL,ZESTRIL) 10 MG tablet Take 10 mg by mouth daily.   05/08/2016 at 1900  . potassium chloride (MICRO-K) 10 MEQ CR capsule Take 10 mEq by mouth daily.   05/08/2016 at 1200  . predniSONE (DELTASONE) 5 MG tablet Take 1 tablet (5 mg total) by mouth daily with breakfast. 30 tablet 0 05/09/2016 at 0900  . saxagliptin HCl (ONGLYZA) 5 MG TABS tablet Take 5 mg by mouth daily.   05/09/2016 at 0900  . warfarin (COUMADIN) 3 MG tablet Take 3 mg by mouth every evening. Pt takes on Monday ,wednesday, friday   05/07/2016 at 1900  . warfarin (COUMADIN) 5 MG tablet Take 5 mg by mouth every evening. Tuesday , thursday, Saturday, sunday   05/08/2016 at 1900  . fluticasone-salmeterol (ADVAIR HFA) 115-21 MCG/ACT inhaler Inhale 2 puffs into the lungs 2 (two) times daily. (Patient not taking: Reported on 05/09/2016) 1 Inhaler 0 Not Taking at Unknown time  . polyethylene glycol (MIRALAX / GLYCOLAX) packet Take 17 g by mouth daily. (Patient not taking: Reported on 05/09/2016) 30 each 0 Not Taking at Unknown time   Assessment: 83 yo male being treated for PNA. Day 5 of Abx therapy.     Plan:  Follow up culture results   Continue Levaquin 750 mg PO Q48H. F/U on stop date.   Gardner CandleSheema M Niklaus Mamaril, PharmD, BCPS Clinical Pharmacist 05/13/2016 9:12 AM

## 2016-05-13 NOTE — Clinical Social Work Placement (Signed)
   CLINICAL SOCIAL WORK PLACEMENT  NOTE  Date:  05/13/2016  Patient Details  Name: Troy BurnRobert S Bos MRN: [id number] Date of Birth: 16-May-1923  Clinical Social Work is seeking post-discharge placement for this patient at the Skilled  Nursing Facility level of care (*CSW will initial, date and re-position this form in  chart as items are completed):  Yes   Patient/family provided with [place] Clinical Social Work Department's list of facilities offering this level of care within the geographic area requested by the patient (or if unable, by the patient's family).  Yes   Patient/family informed of their freedom to choose among providers that offer the needed level of care, that participate in Medicare, Medicaid or managed care program needed by the patient, have an available bed and are willing to accept the patient.  Yes   Patient/family informed of [place]'s ownership interest in [facility] and [facility], as well as of the fact that they are under no obligation to receive care at these facilities.  PASRR submitted to EDS on       PASRR number received on       Existing PASRR number confirmed on 05/12/16     FL2 transmitted to all facilities in geographic area requested by pt/family on 05/12/16     FL2 transmitted to all facilities within larger geographic area on       Patient informed that his/her managed care company has contracts with or will negotiate with certain facilities, including the following:        Yes   Patient/family informed of bed offers received.  Patient chooses bed at  [facility] )     Physician recommends and patient chooses bed at      Patient to be transferred to  [facility] ) on 05/13/16.  Patient to be transferred to facility by  [facility] )     Patient family notified on 05/13/16 of transfer.  Name of family member notified:   (Patient's wife Troy [name] is at bedside and aware of D/C today. )     PHYSICIAN       Additional  Comment:    _______________________________________________ Chadwick Reiswig, Darleen CrockerBailey M, LCSW 05/13/2016, 3:20 PM

## 2016-05-13 NOTE — Progress Notes (Signed)
ANTICOAGULATION CONSULT NOTE - Initial Consult  Pharmacy Consult for warfarin dosing Indication: chronic afib, mitral valve repair  No Known Allergies  Patient Measurements: Height: 5' 9.5" (176.5 cm) Weight: 253 lb 1.6 oz (114.8 kg) IBW/kg (Calculated) : 71.85  Vital Signs: Temp: 97.9 F (36.6 C) (01/09 0446) Temp Source: Oral (01/09 0446) BP: 137/81 (01/09 0446) Pulse Rate: 68 (01/09 0446)  Labs:  Recent Labs  05/11/16 0528 05/12/16 0436 05/13/16 0444  LABPROT 38.8* 31.7* 25.1*  INR 3.85 2.99 2.23    Estimated Creatinine Clearance: 47.6 mL/min (by C-G formula based on SCr of 1.3 mg/dL (H)).   Infusions:    Assessment: Pharmacy consulted to dose warfarin in this [age] yoM admitted with AMS and bilateral PNA. Pt has afib and takes warfarin every evening PTA. Per med rec, pt takes warfarin 3 mg Monday, Wednesday, Friday and 5 mg Tuesday, Thursday, Saturday and Sunday. Patient is on abx that may potentiate INR.   Date:  INR:  Dose: 1/5  3.26  1.5 mg 1/6  3.30  2.5mg  1/7  3.85  HOLD 1/8  2.99  2mg  1/9  2.23  4mg    Goal of Therapy:  INR goal 2-3 Monitor platelets by anticoagulation protocol: Yes   Plan:  INR Therapeutic and trending down. Patient remains on Levaquin which may increase INR. Will order warfarin 3 mg tonight and recheck INR with am labs. Will discuss Abx stop date with hospitalist today.   Gardner CandleSheema M Brendy Ficek, PharmD, BCPS Clinical Pharmacist 05/13/2016 8:53 AM

## 2016-05-13 NOTE — Discharge Summary (Signed)
Troy BurnRobert [name], is a 83 y.o. male  DOB 05-01-1924  MRN [id number].  Admission date:  05/09/2016  Admitting Physician  Troy HammingSnehalatha Traycen Goyer, MD  Discharge Date:  05/13/2016   Primary MD  Troy [name],VISHWANATH, MD  Recommendations for primary care physician for things to follow:   Follow-up with primary doctor in 1 week  Admission Diagnosis  Altered mental status, unspecified altered mental status type [R41.82] Community acquired pneumonia, unspecified laterality [J18.9]   Discharge Diagnosis  Altered mental status, unspecified altered mental status type [R41.82] Community acquired pneumonia, unspecified laterality [J18.9]    Active Problems:   Acute on chronic respiratory failure (HCC)      Past Medical History:  Diagnosis Date  . Chronic atrial fibrillation (HCC)    a. on Couamdin; b. CHADS2VASc at least 7 (CHF, HTN, age x 2, DM, stroke x 2); c. s/p MAZE 1999  . Chronic systolic CHF (congestive heart failure) (HCC)    a. echo 08/01/2015: EF of 45%, mild LVH, mitral valve with annular calcification, mitral valve partially mobile  . CKD (chronic kidney disease), stage III   . COPD (chronic obstructive pulmonary disease) (HCC)   . Diabetes mellitus without complication (HCC)   . Hypertension   . Hypothyroidism   . Legally blind   . Mitral valve prolapse    a. s/p mitral valve repair 1999  . Normal coronary arteries    a. by cardiac cath 1999  . Parkinson's disease (HCC)   . Stroke (HCC)   . TIA (transient ischemic attack)   . UTI (lower urinary tract infection)     Past Surgical History:  Procedure Laterality Date  . CARDIAC CATHETERIZATION N/A 08/08/2015   Procedure: Temporary Pacemaker;  Surgeon: Peter M SwazilandJordan, MD;  [facility];  Service: Cardiovascular;  Laterality: N/A;  . CARDIAC SURGERY    .  CHOLECYSTECTOMY    . EP IMPLANTABLE DEVICE N/A 08/08/2015   Procedure: Pacemaker Implant;  Surgeon: Will Jorja LoaMartin Camnitz, MD;  Location: [facility] INVASIVE CV LAB;  Service: Cardiovascular;  Laterality: N/A;       History of present illness and  Hospital Course:     Kindly see H&P for history of present illness and admission details, please review complete Labs, Consult reports and Test reports for all details in brief  HPI  from the history and physical done on the day of admission 83 year old male patient admitted for shortness of breath, found to have pneumonia.   Hospital Course   #1 community-acquired pneumonia causing acute respiratory failure with hypoxia: Oxygen saturation 70% on 2 L at home. Admitted to hospitalist service, he would Rocephin, Zithromax, oxygen, nebulizers. Patient stable for discharge today, or desaturation 95% today on 2 L. Patient is from home ,but  he is weak due to [pneumonia,advanced age, and Parkinson disease ,so physical therapy recommended skilled nursing. And family chose [facility]. Patient is stable for discharge and arrangements are made, or discharge with Levaquin for 5 doses he is on Levaquin 500 milligrams every 48 hours and he needs to finish it off for 5 doses. Patient still has some slight wheezing much better than the 2 days ago so continue nebs every 4 hours, patient is on steroids we'll wean down the steroids. And he does have phlegm but he is coughing up yellow-green phlegm.   2 chronic atrial fibrillation: Rate controlled. Patient is on Coumadin, Toprol-XL.  3 history of Parkinson's and dementia: Continue Sinemet . Diabetes mellitus type 2: Patient has elevated blood  sugar secondary to steroids. Seen by diabetes coordinator. Patient is on glipizide XL 5 mg by mouth twice a day, Levemir(adjusted the dose),trajdenta,onglyza, we have added NovoLog 5 units 3 times a day to meals, insulin to moderate coverage, night coverage also. Please note nsulin coverage,  NovoLog, Levemir  Process needs to be adjusted based on his blood sugar levels.   metabolic encephalopathy secondary to pneumonia resolved. Alert, awake, oriented.   Discharge Condition: stable   Follow UP      Discharge Instructions  and  Discharge Medications      Allergies as of 05/13/2016   No Known Allergies     Medication List    STOP taking these medications   albuterol 108 (90 Base) MCG/ACT inhaler Commonly known as:  PROVENTIL HFA Replaced by:  albuterol (2.5 MG/3ML) 0.083% nebulizer solution You also have another medication with the same name that you need to continue taking as instructed.   HUMULIN R 500 UNIT/ML injection Generic drug:  insulin regular human CONCENTRATED   predniSONE 5 MG tablet Commonly known as:  DELTASONE Replaced by:  predniSONE 10 MG (21) Tbpk tablet     TAKE these medications   albuterol (2.5 MG/3ML) 0.083% nebulizer solution Commonly known as:  PROVENTIL Take 2.5 mg by nebulization 3 (three) times daily. What changed:  Another medication with the same name was added. Make sure you understand how and when to take each.  Another medication with the same name was removed. Continue taking this medication, and follow the directions you see here.   albuterol (2.5 MG/3ML) 0.083% nebulizer solution Commonly known as:  PROVENTIL Take 3 mLs (2.5 mg total) by nebulization every 6 (six) hours as needed for wheezing or shortness of breath. What changed:  You were already taking a medication with the same name, and this prescription was added. Make sure you understand how and when to take each. Replaces:  albuterol 108 (90 Base) MCG/ACT inhaler   carbidopa-levodopa 25-250 MG tablet Commonly known as:  SINEMET IR Take 1 tablet by mouth 3 (three) times daily.   cetirizine 10 MG tablet Commonly known as:  ZYRTEC Take 10 mg by mouth daily.   fluticasone-salmeterol 115-21 MCG/ACT inhaler Commonly known as:  ADVAIR HFA Inhale 2 puffs into  the lungs 2 (two) times daily.   furosemide 40 MG tablet Commonly known as:  LASIX Take 1 tablet (40 mg total) by mouth daily.   glipiZIDE 5 MG 24 hr tablet Commonly known as:  GLUCOTROL XL Take 1 tablet (5 mg total) by mouth daily with breakfast. What changed:  additional instructions   insulin aspart 100 UNIT/ML injection Commonly known as:  novoLOG Inject 0-5 Units into the skin at bedtime.   insulin aspart 100 UNIT/ML injection Commonly known as:  novoLOG Inject 5 Units into the skin 3 (three) times daily with meals.   insulin glargine 100 UNIT/ML injection Commonly known as:  LANTUS Inject 0.32 mLs (32 Units total) into the skin daily. Start taking on:  05/14/2016 What changed:  how much to take   levofloxacin 750 MG tablet Commonly known as:  LEVAQUIN Take 1 tablet (750 mg total) by mouth every other day. Start taking on:  05/14/2016   lisinopril 10 MG tablet Commonly known as:  PRINIVIL,ZESTRIL Take 10 mg by mouth daily.   polyethylene glycol packet Commonly known as:  MIRALAX / GLYCOLAX Take 17 g by mouth daily.   potassium chloride 10 MEQ CR capsule Commonly known as:  MICRO-K Take 10 mEq  by mouth daily.   predniSONE 10 MG (21) Tbpk tablet Commonly known as:  STERAPRED UNI-PAK 21 TAB Take 1 tablet (10 mg total) by mouth daily. Take as prescribed. Replaces:  predniSONE 5 MG tablet   predniSONE 10 MG (21) Tbpk tablet Commonly known as:  STERAPRED UNI-PAK 21 TAB Take 1 tablet (10 mg total) by mouth daily. Take as prescribed   saxagliptin HCl 5 MG Tabs tablet Commonly known as:  ONGLYZA Take 5 mg by mouth daily.   warfarin 2 MG tablet Commonly known as:  COUMADIN Take 1 tablet (2 mg total) by mouth daily at 6 PM. What changed:  medication strength  how much to take  when to take this  additional instructions  Another medication with the same name was removed. Continue taking this medication, and follow the directions you see here.          Diet and Activity recommendation: See Discharge Instructions above   Consults obtained -   PT   Major procedures and Radiology Reports - PLEASE review detailed and final reports for all details, in brief -      Ct Abdomen Pelvis Wo Contrast  Result Date: 05/11/2016 CLINICAL DATA:  83 y/o M; 1 month of increasing abdominal distention. History of hernia repair. EXAM: CT ABDOMEN AND PELVIS WITHOUT CONTRAST TECHNIQUE: Multidetector CT imaging of the abdomen and pelvis was performed following the standard protocol without IV contrast. COMPARISON:  03/19/2016 CT abdomen and pelvis FINDINGS: Lower chest: Partially visualized pacemaking leads. Trace bilateral pleural effusions. Minor bibasilar atelectasis of the lungs. Hepatobiliary: No focal liver abnormality is seen. Status post cholecystectomy. No biliary dilatation. Pancreas: Unremarkable. No pancreatic ductal dilatation or surrounding inflammatory changes. Spleen: Normal in size without focal abnormality. Adrenals/Urinary Tract: Multiple well-circumscribed homogeneous low-attenuation lesions of the kidneys by lateral measuring up to 76 mm at the left kidney lower pole consistent with multiple renal cysts. Some demonstrate intermediate attenuation and are likely hemorrhagic cysts. No urinary stone disease or obstructive uropathy. Mild heterogeneous irregularity of the wall of the bladder may be due to hypertrophy from outflow obstruction. Stomach/Bowel: Stomach is within normal limits. Appendix appears normal. No evidence of bowel wall thickening, distention, or inflammatory changes. Extensive sigmoid diverticulosis without evidence of diverticulitis. Vascular/Lymphatic: Aortic atherosclerosis. No enlarged abdominal or pelvic lymph nodes. Reproductive: Mild prostate hypertrophy with calcifications. Other: No abdominal wall hernia or abnormality. No abdominopelvic ascites. Musculoskeletal: There is an electrode within the left aspect of the sacrum  at the S2-3 level with device in the left flank soft tissues. Multilevel degenerative changes of the thoracic and lumbar spine with prominent lower lumbar facet arthropathy and grade 1 degenerative L4-5 anterolisthesis is stable.Mild osteoarthrosis of the hip joints bilaterally as well as the symphysis pubis. Mild lumbar levocurvature. No acute osseous abnormality is evident. Chronic left lower rib fractures. 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 IMPRESSION: 1. No acute process identified. 2. Trace bilateral pleural effusions. 3. Sigmoid diverticulosis without evidence of diverticulitis. 4. Aortic atherosclerosis. 5. Thoracolumbar spinal degenerative changes are stable. Electronically Signed   By: Mitzi Hansen M.D.   On: 05/11/2016 01:16   Dg Chest 1 View  Result Date: 05/09/2016 CLINICAL DATA:  Not acting RIGHT, lethargy, history hypertension, diabetes mellitus, Parkinson's, TIA, chronic atrial fibrillation, chronic systolic CHF, COPD, stage III chronic kidney disease EXAM: CHEST 1 VIEW COMPARISON:  Portable exam 1325 hours compared 03/19/2016 FINDINGS: LEFT subclavian pacemaker with lead projecting over RIGHT ventricle. Enlargement of cardiac silhouette with pulmonary vascular  congestion and postsurgical changes of MVR. New RIGHT upper lobe infiltrate consistent with pneumonia. Additional bibasilar opacities which could represent atelectasis or infiltrate. No pleural effusion or pneumothorax. Bones demineralized. IMPRESSION: Enlargement of cardiac silhouette with pulmonary vascular congestion post MVR and pacemaker. RIGHT upper lobe pneumonia with additional atelectasis versus infiltrate at both lung bases. Electronically Signed   By: Ulyses Southward M.D.   On: 05/09/2016 13:44   Ct Head Wo Contrast  Result Date: 05/09/2016 CLINICAL DATA:  83 year old male with lethargy and altered mental status. Prior history of stroke and TIA. EXAM: CT HEAD WITHOUT CONTRAST TECHNIQUE: Contiguous  axial images were obtained from the base of the skull through the vertex without intravenous contrast. COMPARISON:  Head CT 03/19/2016. FINDINGS: Brain: Mild to moderate cerebral atrophy. Patchy and confluent areas of decreased attenuation are noted throughout the deep and periventricular white matter of the cerebral hemispheres bilaterally, compatible with chronic microvascular ischemic disease. No evidence of acute infarction, hemorrhage, hydrocephalus, extra-axial collection or mass lesion/mass effect. Vascular: No hyperdense vessel or unexpected calcification. Skull: Normal. Negative for fracture or focal lesion. Sinuses/Orbits: No acute finding. Other: None. IMPRESSION: 1. No acute intracranial abnormalities. 2. Mild moderate cerebral atrophy with extensive chronic microvascular ischemic changes in the cerebral white matter, as above. Electronically Signed   By: Trudie Reed M.D.   On: 05/09/2016 14:02    Micro Results     Recent Results (from the past 240 hour(s))  MRSA PCR Screening     Status: Abnormal   Collection Time: 05/09/16  7:37 PM  Result Value Ref Range Status   MRSA by PCR (A) NEGATIVE Final    INVALID, UNABLE TO DETERMINE THE PRESENCE OF TARGET DNA DUE TO SPECIMEN INTEGRITY. RECOLLECTION REQUESTED.    Comment:        The GeneXpert MRSA Assay (FDA approved for NASAL specimens only), is one component of a comprehensive MRSA colonization surveillance program. It is not intended to diagnose MRSA infection nor to guide or monitor treatment for MRSA infections. C/DRUSILLA JACKSON AT 2248 05/09/16.PMH   MRSA PCR Screening     Status: Abnormal   Collection Time: 05/10/16  5:20 AM  Result Value Ref Range Status   MRSA by PCR (A) NEGATIVE Final    INVALID, UNABLE TO DETERMINE THE PRESENCE OF TARGET DNA DUE TO SPECIMEN INTEGRITY. RECOLLECTION REQUESTED.    Comment: RESULT CALLED TO, READ BACK BY AND VERIFIED WITH: CHRIS BENNETT AT 8:09 ON 05/10/2016 JLJ   MRSA PCR Screening      Status: None   Collection Time: 05/10/16 10:27 AM  Result Value Ref Range Status   MRSA by PCR NEGATIVE NEGATIVE Final    Comment:        The GeneXpert MRSA Assay (FDA approved for NASAL specimens only), is one component of a comprehensive MRSA colonization surveillance program. It is not intended to diagnose MRSA infection nor to guide or monitor treatment for MRSA infections.        Today   Subjective:   [place] today has no shortness of breath, no chest pain stable for discharge.  Objective:   Blood pressure 137/81, pulse 68, temperature 97.9 F (36.6 C), temperature source Oral, resp. rate 18, height 5' 9.5" (1.765 m), weight 114.8 kg (253 lb 1.6 oz), SpO2 95 %.   Intake/Output Summary (Last 24 hours) at 05/13/16 0857 Last data filed at 05/13/16 1610  Gross per 24 hour  Intake  360 ml  Output              150 ml  Net              210 ml    Exam Awake Alert, Oriented x 3, No new F.N deficits, Normal affect [place].AT,PERRAL Supple Neck,No JVD, No cervical lymphadenopathy appriciated.  Symmetrical Chest wall movement, Good air movement bilaterally, CTAB. RRR,No Gallops,Rubs or new Murmurs, No Parasternal Heave +ve B.Sounds, Abd Soft, Non tender, No organomegaly appriciated, No rebound -guarding or rigidity. No Cyanosis, Clubbing or edema, No new Rash or bruise  Data Review   CBC w Diff:  Lab Results  Component Value Date   WBC 9.1 05/10/2016   HGB 13.2 05/10/2016   HGB 13.3 06/05/2014   HCT 39.4 (L) 05/10/2016   HCT 41.3 06/05/2014   PLT 154 05/10/2016   PLT 188 06/05/2014   LYMPHOPCT 9 05/09/2016   LYMPHOPCT 14.8 06/05/2014   MONOPCT 10 05/09/2016   MONOPCT 8.9 06/05/2014   EOSPCT 1 05/09/2016   EOSPCT 4.7 06/05/2014   BASOPCT 0 05/09/2016   BASOPCT 0.7 06/05/2014    CMP:  Lab Results  Component Value Date   NA 136 05/10/2016   NA 139 06/05/2014   K 4.7 05/10/2016   K 3.6 06/05/2014   CL 101 05/10/2016   CL 104  06/05/2014   CO2 26 05/10/2016   CO2 30 06/05/2014   BUN 30 (H) 05/10/2016   BUN 13 06/05/2014   CREATININE 1.30 (H) 05/10/2016   CREATININE 1.16 06/05/2014   PROT 6.7 05/09/2016   PROT 7.6 06/05/2014   ALBUMIN 3.6 05/09/2016   ALBUMIN 3.0 (L) 06/05/2014   BILITOT 0.9 05/09/2016   BILITOT 1.8 (H) 06/05/2014   ALKPHOS 69 05/09/2016   ALKPHOS 68 06/05/2014   AST 21 05/09/2016   AST 27 06/05/2014   ALT 6 (L) 05/09/2016   ALT 17 06/05/2014  .   Total Time in preparing paper work, data evaluation and todays exam - 35 minutes  Janelys Glassner M.D on 05/13/2016 at 8:57 AM    Note: This dictation was prepared with Dragon dictation along with smaller phrase technology. Any transcriptional errors that result from this process are unintentional.

## 2016-05-16 ENCOUNTER — Encounter: Payer: Self-pay | Admitting: Cardiology

## 2016-05-16 NOTE — Progress Notes (Signed)
Letter  

## 2016-06-25 ENCOUNTER — Other Ambulatory Visit: Payer: Self-pay | Admitting: Internal Medicine

## 2016-06-25 DIAGNOSIS — M7989 Other specified soft tissue disorders: Secondary | ICD-10-CM

## 2016-06-26 ENCOUNTER — Encounter: Payer: Self-pay | Admitting: Cardiology

## 2016-06-26 ENCOUNTER — Ambulatory Visit
Admission: RE | Admit: 2016-06-26 | Discharge: 2016-06-26 | Disposition: A | Discharge status: Home / Self Care | Payer: Medicare Other | Source: Ambulatory Visit | Attending: Internal Medicine | Admitting: Internal Medicine

## 2016-06-26 DIAGNOSIS — M7989 Other specified soft tissue disorders: Secondary | ICD-10-CM

## 2016-06-30 ENCOUNTER — Ambulatory Visit: Payer: Medicare Other

## 2016-09-02 ENCOUNTER — Ambulatory Visit (INDEPENDENT_AMBULATORY_CARE_PROVIDER_SITE_OTHER): Payer: Medicare Other | Admitting: Internal Medicine

## 2016-09-02 ENCOUNTER — Encounter: Payer: Self-pay | Admitting: Internal Medicine

## 2016-09-02 VITALS — BP 110/80 | HR 91 | Ht 69.5 in | Wt 243.0 lb

## 2016-09-02 DIAGNOSIS — I482 Chronic atrial fibrillation, unspecified: Secondary | ICD-10-CM

## 2016-09-02 DIAGNOSIS — I495 Sick sinus syndrome: Secondary | ICD-10-CM | POA: Diagnosis not present

## 2016-09-02 DIAGNOSIS — Z950 Presence of cardiac pacemaker: Secondary | ICD-10-CM

## 2016-09-02 LAB — CUP PACEART INCLINIC DEVICE CHECK
Battery Remaining Longevity: 124 mo
Implantable Lead Implant Date: 20170405
Implantable Lead Location: 753860
Implantable Lead Model: 5076
Implantable Pulse Generator Implant Date: 20170405
Lead Channel Pacing Threshold Amplitude: 0.75 V
Lead Channel Setting Sensing Sensitivity: 2 mV
MDC IDC MSMT BATTERY VOLTAGE: 3.03 V
MDC IDC MSMT LEADCHNL RV IMPEDANCE VALUE: 418 Ohm
MDC IDC MSMT LEADCHNL RV IMPEDANCE VALUE: 532 Ohm
MDC IDC MSMT LEADCHNL RV PACING THRESHOLD PULSEWIDTH: 0.4 ms
MDC IDC MSMT LEADCHNL RV SENSING INTR AMPL: 5.5 mV
MDC IDC SESS DTM: 20180501143505
MDC IDC SET LEADCHNL RV PACING AMPLITUDE: 2.5 V
MDC IDC SET LEADCHNL RV PACING PULSEWIDTH: 0.4 ms
MDC IDC STAT BRADY RV PERCENT PACED: 12.05 %

## 2016-09-02 NOTE — Patient Instructions (Signed)

## 2016-09-02 NOTE — Progress Notes (Signed)
Patient Care Team: Barbette Reichmann, MD as PCP - General (Internal Medicine) Delma Freeze, FNP as Nurse Practitioner (Family Medicine) Nolon Rod, MD as Referring Physician (Cardiology)   HPI  Troy [name] is a 83 y.o. male Seen today for follow-up of a pacemaker implanted 4/17 by Dr. Derek Jack. He has a history of atrial fibrillation on Coumadin with a prior stroke. He had presented with asystole and syncope.  No further syncope  Able to ambulate with shortness of breath--no chest pain   Thromboembolic risk factors ( age -1, HTN-1, TIA/CVA-2, DM-1, Vasc disease -1, congestive heart failure-1 ) for a CHADSVASc Score of 8  Echo EF 3/17 f45%  He has a history of remote mitral valve repair and MAZE 1999     Past Medical History:  Diagnosis Date  . Chronic atrial fibrillation (HCC)    a. on Couamdin; b. CHADS2VASc at least 7 (CHF, HTN, age x 2, DM, stroke x 2); c. s/p MAZE 1999  . Chronic systolic CHF (congestive heart failure) (HCC)    a. echo 08/01/2015: EF of 45%, mild LVH, mitral valve with annular calcification, mitral valve partially mobile  . CKD (chronic kidney disease), stage III   . COPD (chronic obstructive pulmonary disease) (HCC)   . Diabetes mellitus without complication (HCC)   . Hypertension   . Hypothyroidism   . Legally blind   . Mitral valve prolapse    a. s/p mitral valve repair 1999  . Normal coronary arteries    a. by cardiac cath 1999  . Parkinson's disease (HCC)   . Stroke (HCC)   . TIA (transient ischemic attack)   . UTI (lower urinary tract infection)     Past Surgical History:  Procedure Laterality Date  . CARDIAC CATHETERIZATION N/A 08/08/2015   Procedure: Temporary Pacemaker;  Surgeon: Peter M Swaziland, MD;  Location: [facility] INVASIVE CV LAB;  Service: Cardiovascular;  Laterality: N/A;  . CARDIAC SURGERY    . CHOLECYSTECTOMY    . EP IMPLANTABLE DEVICE N/A 08/08/2015   Procedure: Pacemaker Implant;  Surgeon: Will Jorja Loa, MD;  Location:  [facility] INVASIVE CV LAB;  Service: Cardiovascular;  Laterality: N/A;    Current Outpatient Prescriptions  Medication Sig Dispense Refill  . albuterol (PROVENTIL) (2.5 MG/3ML) 0.083% nebulizer solution Take 3 mLs (2.5 mg total) by nebulization every 6 (six) hours as needed for wheezing or shortness of breath. 75 mL 12  . carbidopa-levodopa (SINEMET IR) 25-250 MG tablet Take 1 tablet by mouth 4 (four) times daily.     . cetirizine (ZYRTEC) 10 MG tablet Take 10 mg by mouth daily.    . fluticasone-salmeterol (ADVAIR HFA) 115-21 MCG/ACT inhaler Inhale 2 puffs into the lungs 2 (two) times daily. (Patient taking differently: Inhale 2 puffs into the lungs 2 (two) times daily as needed. ) 1 Inhaler 0  . furosemide (LASIX) 40 MG tablet Take 1 tablet (40 mg total) by mouth daily. 30 tablet 6  . glipiZIDE (GLUCOTROL) 10 MG tablet Take 10 mg by mouth daily before breakfast.    . insulin aspart (NOVOLOG) 100 UNIT/ML injection Inject 0-5 Units into the skin at bedtime. (Patient taking differently: Inject 10 Units into the skin 2 (two) times daily. ) 10 mL 11  . insulin glargine (LANTUS) 100 UNIT/ML injection Inject 0.32 mLs (32 Units total) into the skin daily. (Patient taking differently: Inject 20 Units into the skin at bedtime. ) 10 mL 11  . lisinopril (PRINIVIL,ZESTRIL) 10 MG tablet Take 10  mg by mouth daily.    . metroNIDAZOLE (METROGEL) 0.75 % gel Apply 1 application topically daily.    [name] neomycin-polymyxin-dexameth (MAXITROL) 0.1 % OINT Place 1 application into both eyes at bedtime.    . potassium chloride (MICRO-K) 10 MEQ CR capsule Take 10 mEq by mouth daily.    . predniSONE (STERAPRED UNI-PAK 21 TAB) 10 MG (21) TBPK tablet Take 1 tablet (10 mg total) by mouth daily. Take as prescribed (Patient taking differently: Take 5 mg by mouth daily. Take as prescribed) 21 tablet 0  . saxagliptin HCl (ONGLYZA) 5 MG TABS tablet Take 5 mg by mouth daily.    [name] warfarin (COUMADIN) 3 MG tablet Take 3 mg by mouth as  directed.     . warfarin (COUMADIN) 5 MG tablet Take 5 mg by mouth as directed.      No current facility-administered medications for this visit.     No Known Allergies    Review of Systems negative except from HPI and PMH  Physical Exam BP 110/80 (BP Location: Left Arm, Patient Position: Sitting, Cuff Size: Normal)   Pulse 91   Ht 5' 9.5" (1.765 m)   Wt 243 lb (110.2 kg)   BMI 35.37 kg/m  Well developed and well nourished in no acute distress HENT normal E scleral and icterus clear Neck Supple JVP flat; carotids brisk and full Clear to ausculation Device pocket well healed; without hematoma or erythema.  There is no tethering  *Regular rate and rhythm, no murmurs gallops or rub Soft with active bowel sounds No clubbing cyanosis  Edema Alert and oriented, grossly normal motor and sensory function Skin Warm and Dry  ECG personally reviewed  Afib with intermittent ventricular pacing  Assessment and  Plan  Syncope    Mitral valve repair   Atrial fibrillation and bradycardia  Pacemaker  The patient's device was interrogated.  The information was reviewed. No changes were made in the programming.      We discussed the use of the NOACs compared to Coumadin. We briefly reviewed the data of at least comparability in stroke prevention, bleeding and outcome. We discussed some of the new once wherein somewhat associated with decreased ischemic stroke risk, one to be taken daily, and has been shown to be comparable and bleeding risk to aspirin.  We also discussed bleeding associated with warfarin as well as NOACs and a wall bleeding as a complication of all these drugs intracranial bleeding is more frequently associated with warfarin then the NOACs and a GI bleeding is more commonly associated with the latter  I have suggested they talk to PCP re NOAC  Her valve issue should not be preclusive       Current medicines are reviewed at length with the patient today .  The  patient does not  have concerns regarding medicines.

## 2016-12-02 ENCOUNTER — Ambulatory Visit: Payer: Medicare Other | Admitting: *Deleted

## 2016-12-02 ENCOUNTER — Telehealth: Payer: Self-pay | Admitting: Cardiology

## 2016-12-02 NOTE — Telephone Encounter (Signed)
Spoke with pt and reminded pt of remote transmission that is due today. Pt verbalized understanding.   

## 2016-12-03 NOTE — Progress Notes (Signed)
Not received  

## 2016-12-05 ENCOUNTER — Encounter: Payer: Self-pay | Admitting: Cardiology

## 2017-01-16 ENCOUNTER — Emergency Department
Admission: EM | Admit: 2017-01-16 | Discharge: 2017-01-16 | Disposition: A | Discharge status: Home / Self Care | Payer: Medicare Other | Attending: Emergency Medicine | Admitting: Emergency Medicine

## 2017-01-16 ENCOUNTER — Emergency Department: Payer: Medicare Other

## 2017-01-16 ENCOUNTER — Encounter: Payer: Self-pay | Admitting: Emergency Medicine

## 2017-01-16 DIAGNOSIS — N183 Chronic kidney disease, stage 3 (moderate): Secondary | ICD-10-CM | POA: Diagnosis not present

## 2017-01-16 DIAGNOSIS — R1012 Left upper quadrant pain: Secondary | ICD-10-CM | POA: Diagnosis present

## 2017-01-16 DIAGNOSIS — E039 Hypothyroidism, unspecified: Secondary | ICD-10-CM | POA: Diagnosis not present

## 2017-01-16 DIAGNOSIS — I2510 Atherosclerotic heart disease of native coronary artery without angina pectoris: Secondary | ICD-10-CM | POA: Diagnosis not present

## 2017-01-16 DIAGNOSIS — I5043 Acute on chronic combined systolic (congestive) and diastolic (congestive) heart failure: Secondary | ICD-10-CM | POA: Diagnosis not present

## 2017-01-16 DIAGNOSIS — E1122 Type 2 diabetes mellitus with diabetic chronic kidney disease: Secondary | ICD-10-CM | POA: Diagnosis not present

## 2017-01-16 DIAGNOSIS — G20 Parkinson's disease: Secondary | ICD-10-CM | POA: Insufficient documentation

## 2017-01-16 DIAGNOSIS — I130 Hypertensive heart and chronic kidney disease with heart failure and stage 1 through stage 4 chronic kidney disease, or unspecified chronic kidney disease: Secondary | ICD-10-CM | POA: Insufficient documentation

## 2017-01-16 DIAGNOSIS — Z87891 Personal history of nicotine dependence: Secondary | ICD-10-CM | POA: Insufficient documentation

## 2017-01-16 DIAGNOSIS — B029 Zoster without complications: Secondary | ICD-10-CM

## 2017-01-16 DIAGNOSIS — Z8673 Personal history of transient ischemic attack (TIA), and cerebral infarction without residual deficits: Secondary | ICD-10-CM | POA: Diagnosis not present

## 2017-01-16 DIAGNOSIS — H548 Legal blindness, as defined in USA: Secondary | ICD-10-CM | POA: Diagnosis not present

## 2017-01-16 LAB — URINALYSIS, COMPLETE (UACMP) WITH MICROSCOPIC
Bacteria, UA: NONE SEEN
Bilirubin Urine: NEGATIVE
Glucose, UA: >=500 mg/dL — AB
Ketones, ur: NEGATIVE mg/dL
Nitrite: NEGATIVE
Protein, ur: NEGATIVE mg/dL
SPECIFIC GRAVITY, URINE: 1.012 (ref 1.005–1.030)
SQUAMOUS EPITHELIAL / LPF: NONE SEEN
pH: 5 (ref 5.0–8.0)

## 2017-01-16 LAB — COMPREHENSIVE METABOLIC PANEL
ALBUMIN: 3.5 g/dL (ref 3.5–5.0)
ALT: 7 U/L — ABNORMAL LOW (ref 17–63)
AST: 18 U/L (ref 15–41)
Alkaline Phosphatase: 69 U/L (ref 38–126)
Anion gap: 7 (ref 5–15)
BILIRUBIN TOTAL: 0.9 mg/dL (ref 0.3–1.2)
BUN: 19 mg/dL (ref 6–20)
CALCIUM: 8.8 mg/dL — AB (ref 8.9–10.3)
CHLORIDE: 102 mmol/L (ref 101–111)
CO2: 30 mmol/L (ref 22–32)
CREATININE: 1.23 mg/dL (ref 0.61–1.24)
GFR calc Af Amer: 57 mL/min — ABNORMAL LOW (ref >60)
GFR calc non Af Amer: 49 mL/min — ABNORMAL LOW (ref >60)
Glucose, Bld: 317 mg/dL — ABNORMAL HIGH (ref 65–99)
Potassium: 3.9 mmol/L (ref 3.5–5.1)
SODIUM: 139 mmol/L (ref 135–145)
Total Protein: 6.9 g/dL (ref 6.5–8.1)

## 2017-01-16 LAB — CBC
HCT: 39.2 % — ABNORMAL LOW (ref 40.0–52.0)
Hemoglobin: 13.2 g/dL (ref 13.0–18.0)
MCH: 30.6 pg (ref 26.0–34.0)
MCHC: 33.6 g/dL (ref 32.0–36.0)
MCV: 91.1 fL (ref 80.0–100.0)
PLATELETS: 162 10*3/uL (ref 150–440)
RBC: 4.3 MIL/uL — AB (ref 4.40–5.90)
RDW: 14.3 % (ref 11.5–14.5)
WBC: 8.6 10*3/uL (ref 3.8–10.6)

## 2017-01-16 LAB — LIPASE, BLOOD: LIPASE: 26 U/L (ref 11–51)

## 2017-01-16 LAB — PROTIME-INR
INR: 1.2
PROTHROMBIN TIME: 15.1 s (ref 11.4–15.2)

## 2017-01-16 MED ORDER — OXYCODONE-ACETAMINOPHEN 5-325 MG PO TABS
2.0000 | ORAL_TABLET | Freq: Once | ORAL | Status: AC
  Administered 2017-01-16: 2 via ORAL
  Filled 2017-01-16: qty 2

## 2017-01-16 MED ORDER — HYDROCODONE-ACETAMINOPHEN 5-325 MG PO TABS: 1.0000 | ORAL_TABLET | Freq: Four times a day (QID) | ORAL | 0 refills | Status: DC | PRN

## 2017-01-16 MED ORDER — VALACYCLOVIR HCL 1 G PO TABS: 1000.0000 mg | ORAL_TABLET | Freq: Three times a day (TID) | ORAL | 0 refills | Status: DC

## 2017-01-16 NOTE — ED Triage Notes (Signed)
Pt here for LUQ pain for 3 days.  No nausea or vomiting. Has had some diarrhea.  No fevers.  Wife wants to make sure know pt is DNR. NAD. VSS.  No blood in stool.  Color WNL at this time.

## 2017-01-16 NOTE — ED Provider Notes (Signed)
[facility] Emergency Department Provider Note       Time seen: ----------------------------------------- 12:25 PM on 01/16/2017 -----------------------------------------     I have reviewed the triage vital signs and the nursing notes.   HISTORY   Chief Complaint Abdominal Pain    HPI Troy [name] is a 83 y.o. male who presents to the ED for left upper quadrant pain for the last 3 days. Patient is not had vomiting but has had some diarrhea. He has not had any fever or blood in his stool. He reports he has had a kidney stone in the past. They have not noticed any rash. Nothing makes the pain better or worse.   Past Medical History:  Diagnosis Date  . Chronic atrial fibrillation (HCC)    a. on Couamdin; b. CHADS2VASc at least 7 (CHF, HTN, age x 2, DM, stroke x 2); c. s/p MAZE 1999  . Chronic systolic CHF (congestive heart failure) (HCC)    a. echo 08/01/2015: EF of 45%, mild LVH, mitral valve with annular calcification, mitral valve partially mobile  . CKD (chronic kidney disease), stage III   . COPD (chronic obstructive pulmonary disease) (HCC)   . Diabetes mellitus without complication (HCC)   . Hypertension   . Hypothyroidism   . Legally blind   . Mitral valve prolapse    a. s/p mitral valve repair 1999  . Normal coronary arteries    a. by cardiac cath 1999  . Parkinson's disease (HCC)   . Stroke (HCC)   . TIA (transient ischemic attack)   . UTI (lower urinary tract infection)     Patient Active Problem List   Diagnosis Date Noted  . Acute on chronic respiratory failure (HCC) 05/09/2016  . Dehydration 03/21/2016  . Hypotension 03/21/2016  . Fall 03/21/2016  . Chest wall contusion, left, initial encounter 03/21/2016  . Hypoxia 03/21/2016  . Generalized weakness 03/21/2016  . Leukocytosis 03/21/2016  . Acute on chronic renal failure (HCC) 03/19/2016  . Acute exacerbation of chronic obstructive pulmonary disease (COPD) (HCC)  12/07/2015  . Acute on chronic respiratory failure with hypoxia (HCC) 09/28/2015  . HTN (hypertension) 09/25/2015  . Parkinson's disease (HCC) 09/25/2015  . Hypothyroidism 09/25/2015  . COPD exacerbation (HCC) 09/12/2015  . Diabetes (HCC) 08/20/2015  . SSS (sick sinus syndrome) (HCC) 08/07/2015  . Chronic atrial fibrillation (HCC)   . Sinus pause   . Sick sinus syndrome (HCC)   . Acute on chronic diastolic CHF (congestive heart failure), NYHA class 1 (HCC) 08/03/2015  . Lower extremity weakness 02/25/2015    Past Surgical History:  Procedure Laterality Date  . CARDIAC CATHETERIZATION N/A 08/08/2015   Procedure: Temporary Pacemaker;  Surgeon: Peter M Swaziland, MD;  Location: [facility] INVASIVE CV LAB;  Service: Cardiovascular;  Laterality: N/A;  . CARDIAC SURGERY    . CHOLECYSTECTOMY    . EP IMPLANTABLE DEVICE N/A 08/08/2015   Procedure: Pacemaker Implant;  Surgeon: Will Jorja Loa, MD;  Location: [facility] INVASIVE CV LAB;  Service: Cardiovascular;  Laterality: N/A;    Allergies Patient has no known allergies.  Social History Social History  Substance Use Topics  . Smoking status: Former Smoker    Packs/day: 4.00    Years: 20.00    Types: Cigarettes  . Smokeless tobacco: Never Used  . Alcohol use No    Review of Systems Constitutional: Negative for fever. Cardiovascular: Negative for chest pain. Respiratory: Negative for shortness of breath. Gastrointestinal: Positive for abdominal pain, diarrhea Genitourinary: Negative for dysuria.  Musculoskeletal: Negative for back pain. Skin: Negative for rash. Neurological: Negative for headaches, focal weakness or numbness.  All systems negative/normal/unremarkable except as stated in the HPI  ____________________________________________   PHYSICAL EXAM:  VITAL SIGNS: ED Triage Vitals [01/16/17 1134]  Enc Vitals Group     BP (!) 143/69     Pulse Rate 92     Resp 18     Temp 98.9 F (37.2 C)     Temp Source Oral     SpO2 94 %      Weight 243 lb (110.2 kg)     Height  (1.753 m)     Head Circumference      Peak Flow      Pain Score 6     Pain Loc      Pain Edu?      Excl. in GC?     Constitutional: Alert and oriented. Well appearing and in no distress. Eyes: Conjunctivae are normal. Normal extraocular movements. ENT   Head: Normocephalic and atraumatic.   Nose: No congestion/rhinnorhea.   Mouth/Throat: Mucous membranes are moist.   Neck: No stridor. Cardiovascular: Normal rate, regular rhythm. No murmurs, rubs, or gallops. Respiratory: Normal respiratory effort without tachypnea nor retractions. Breath sounds are clear and equal bilaterally. No wheezes/rales/rhonchi. Gastrointestinal: Mild flank tenderness, no rebound or guarding. Normal bowel sounds. Musculoskeletal: Nontender with normal range of motion in extremities. No lower extremity tenderness nor edema. Neurologic:  Normal speech and language. No gross focal neurologic deficits are appreciated.  Skin:  Group erythematous lesions at approximately T9 on the left anteriorly Psychiatric: Mood and affect are normal. Speech and behavior are normal.  ____________________________________________  EKG: Interpreted by me. Atrial fibrillation with PVC, incomplete right bundle branch block, normal QT.  ____________________________________________  ED COURSE:  Pertinent labs & imaging results that were available during my care of the patient were reviewed by me and considered in my medical decision making (see chart for details). Patient presents for flank pain, we will assess with labs and imaging as indicated. Patient clinically may have shingles.   Procedures ____________________________________________   LABS (pertinent positives/negatives)  Labs Reviewed  CBC - Abnormal; Notable for the following:       Result Value   RBC 4.30 (*)    HCT 39.2 (*)    All other components within normal limits  URINALYSIS, COMPLETE (UACMP) WITH  MICROSCOPIC - Abnormal; Notable for the following:    Color, Urine YELLOW (*)    APPearance CLEAR (*)    Glucose, UA >=500 (*)    Hgb urine dipstick MODERATE (*)    Leukocytes, UA TRACE (*)    All other components within normal limits  LIPASE, BLOOD  COMPREHENSIVE METABOLIC PANEL    RADIOLOGY Images were viewed by me  CT renal protocol IMPRESSION: No acute findings in the abdomen/pelvis.  Multiple bilateral renal cysts unchanged.  Colonic diverticulosis without active inflammation.  Stable cardiomegaly.  Subtle loss of height of L5 unchanged.  Aortic Atherosclerosis (ICD10-I70.0). ____________________________________________  FINAL ASSESSMENT AND PLAN  Shingles   Plan: Patient's labs and imaging were dictated above. Patient had presented for left flank pain and headache and obvious rash in the left lower abdominal wall consistent with shingles. No other etiology was identified to explain his pain. He'll be discharged with antiviral medication and outpatient follow up.    Emily Filbert, MD   Note: This note was generated in part or whole with voice recognition software. Voice recognition is usually quite  accurate but there are transcription errors that can and very often do occur. I apologize for any typographical errors that were not detected and corrected.     Livie Vanderhoof, Emily Filbert/14/18 [phone]

## 2017-01-16 NOTE — ED Notes (Signed)
Pt taken to care via wheelchair and helped into car by this RN. PT in NAD at this time.

## 2017-01-16 NOTE — ED Notes (Signed)
Patient transported to CT 

## 2017-03-09 ENCOUNTER — Emergency Department: Payer: Medicare Other

## 2017-03-09 ENCOUNTER — Inpatient Hospital Stay
Admission: EM | Admit: 2017-03-09 | Discharge: 2017-03-18 | DRG: 291 | Disposition: A | Discharge status: Skilled Nursing Facility | Payer: Medicare Other | Attending: Internal Medicine | Admitting: Internal Medicine

## 2017-03-09 DIAGNOSIS — H548 Legal blindness, as defined in USA: Secondary | ICD-10-CM | POA: Diagnosis present

## 2017-03-09 DIAGNOSIS — N179 Acute kidney failure, unspecified: Secondary | ICD-10-CM | POA: Diagnosis present

## 2017-03-09 DIAGNOSIS — E1140 Type 2 diabetes mellitus with diabetic neuropathy, unspecified: Secondary | ICD-10-CM | POA: Diagnosis present

## 2017-03-09 DIAGNOSIS — Z8673 Personal history of transient ischemic attack (TIA), and cerebral infarction without residual deficits: Secondary | ICD-10-CM

## 2017-03-09 DIAGNOSIS — J440 Chronic obstructive pulmonary disease with acute lower respiratory infection: Secondary | ICD-10-CM | POA: Diagnosis present

## 2017-03-09 DIAGNOSIS — I2510 Atherosclerotic heart disease of native coronary artery without angina pectoris: Secondary | ICD-10-CM | POA: Diagnosis present

## 2017-03-09 DIAGNOSIS — I341 Nonrheumatic mitral (valve) prolapse: Secondary | ICD-10-CM | POA: Diagnosis present

## 2017-03-09 DIAGNOSIS — J441 Chronic obstructive pulmonary disease with (acute) exacerbation: Secondary | ICD-10-CM | POA: Diagnosis present

## 2017-03-09 DIAGNOSIS — E872 Acidosis: Secondary | ICD-10-CM | POA: Diagnosis not present

## 2017-03-09 DIAGNOSIS — I509 Heart failure, unspecified: Secondary | ICD-10-CM

## 2017-03-09 DIAGNOSIS — E039 Hypothyroidism, unspecified: Secondary | ICD-10-CM | POA: Diagnosis present

## 2017-03-09 DIAGNOSIS — Z7901 Long term (current) use of anticoagulants: Secondary | ICD-10-CM

## 2017-03-09 DIAGNOSIS — T380X5A Adverse effect of glucocorticoids and synthetic analogues, initial encounter: Secondary | ICD-10-CM | POA: Diagnosis present

## 2017-03-09 DIAGNOSIS — L89609 Pressure ulcer of unspecified heel, unspecified stage: Secondary | ICD-10-CM | POA: Diagnosis present

## 2017-03-09 DIAGNOSIS — Z79899 Other long term (current) drug therapy: Secondary | ICD-10-CM

## 2017-03-09 DIAGNOSIS — R339 Retention of urine, unspecified: Secondary | ICD-10-CM | POA: Diagnosis present

## 2017-03-09 DIAGNOSIS — G20 Parkinson's disease: Secondary | ICD-10-CM | POA: Diagnosis present

## 2017-03-09 DIAGNOSIS — J9600 Acute respiratory failure, unspecified whether with hypoxia or hypercapnia: Secondary | ICD-10-CM | POA: Diagnosis present

## 2017-03-09 DIAGNOSIS — J189 Pneumonia, unspecified organism: Secondary | ICD-10-CM | POA: Diagnosis present

## 2017-03-09 DIAGNOSIS — E871 Hypo-osmolality and hyponatremia: Secondary | ICD-10-CM | POA: Diagnosis not present

## 2017-03-09 DIAGNOSIS — E669 Obesity, unspecified: Secondary | ICD-10-CM | POA: Diagnosis present

## 2017-03-09 DIAGNOSIS — R21 Rash and other nonspecific skin eruption: Secondary | ICD-10-CM | POA: Diagnosis not present

## 2017-03-09 DIAGNOSIS — E875 Hyperkalemia: Secondary | ICD-10-CM | POA: Diagnosis not present

## 2017-03-09 DIAGNOSIS — I130 Hypertensive heart and chronic kidney disease with heart failure and stage 1 through stage 4 chronic kidney disease, or unspecified chronic kidney disease: Principal | ICD-10-CM | POA: Diagnosis present

## 2017-03-09 DIAGNOSIS — J9621 Acute and chronic respiratory failure with hypoxia: Secondary | ICD-10-CM | POA: Diagnosis present

## 2017-03-09 DIAGNOSIS — Z7951 Long term (current) use of inhaled steroids: Secondary | ICD-10-CM

## 2017-03-09 DIAGNOSIS — I959 Hypotension, unspecified: Secondary | ICD-10-CM | POA: Diagnosis present

## 2017-03-09 DIAGNOSIS — E11649 Type 2 diabetes mellitus with hypoglycemia without coma: Secondary | ICD-10-CM | POA: Diagnosis present

## 2017-03-09 DIAGNOSIS — Z87891 Personal history of nicotine dependence: Secondary | ICD-10-CM

## 2017-03-09 DIAGNOSIS — I5033 Acute on chronic diastolic (congestive) heart failure: Secondary | ICD-10-CM | POA: Diagnosis present

## 2017-03-09 DIAGNOSIS — Z66 Do not resuscitate: Secondary | ICD-10-CM | POA: Diagnosis present

## 2017-03-09 DIAGNOSIS — Z6835 Body mass index (BMI) 35.0-35.9, adult: Secondary | ICD-10-CM

## 2017-03-09 DIAGNOSIS — I482 Chronic atrial fibrillation: Secondary | ICD-10-CM | POA: Diagnosis present

## 2017-03-09 DIAGNOSIS — Z95810 Presence of automatic (implantable) cardiac defibrillator: Secondary | ICD-10-CM

## 2017-03-09 DIAGNOSIS — I4891 Unspecified atrial fibrillation: Secondary | ICD-10-CM

## 2017-03-09 DIAGNOSIS — L8990 Pressure ulcer of unspecified site, unspecified stage: Secondary | ICD-10-CM

## 2017-03-09 DIAGNOSIS — R402414 Glasgow coma scale score 13-15, 24 hours or more after hospital admission: Secondary | ICD-10-CM | POA: Diagnosis present

## 2017-03-09 DIAGNOSIS — J9601 Acute respiratory failure with hypoxia: Secondary | ICD-10-CM

## 2017-03-09 DIAGNOSIS — Z7952 Long term (current) use of systemic steroids: Secondary | ICD-10-CM

## 2017-03-09 DIAGNOSIS — Z452 Encounter for adjustment and management of vascular access device: Secondary | ICD-10-CM

## 2017-03-09 DIAGNOSIS — N183 Chronic kidney disease, stage 3 (moderate): Secondary | ICD-10-CM | POA: Diagnosis present

## 2017-03-09 DIAGNOSIS — E1165 Type 2 diabetes mellitus with hyperglycemia: Secondary | ICD-10-CM | POA: Diagnosis present

## 2017-03-09 DIAGNOSIS — E1122 Type 2 diabetes mellitus with diabetic chronic kidney disease: Secondary | ICD-10-CM | POA: Diagnosis present

## 2017-03-09 DIAGNOSIS — Z794 Long term (current) use of insulin: Secondary | ICD-10-CM

## 2017-03-09 DIAGNOSIS — Z951 Presence of aortocoronary bypass graft: Secondary | ICD-10-CM

## 2017-03-09 LAB — CBC
HCT: 41.9 % (ref 40.0–52.0)
Hemoglobin: 13.9 g/dL (ref 13.0–18.0)
MCH: 30.8 pg (ref 26.0–34.0)
MCHC: 33 g/dL (ref 32.0–36.0)
MCV: 93.3 fL (ref 80.0–100.0)
PLATELETS: 140 10*3/uL — AB (ref 150–440)
RBC: 4.5 MIL/uL (ref 4.40–5.90)
RDW: 14.9 % — AB (ref 11.5–14.5)
WBC: 7.6 10*3/uL (ref 3.8–10.6)

## 2017-03-09 LAB — BASIC METABOLIC PANEL
Anion gap: 11 (ref 5–15)
BUN: 22 mg/dL — AB (ref 6–20)
CALCIUM: 8.3 mg/dL — AB (ref 8.9–10.3)
CO2: 28 mmol/L (ref 22–32)
CREATININE: 1.43 mg/dL — AB (ref 0.61–1.24)
Chloride: 98 mmol/L — ABNORMAL LOW (ref 101–111)
GFR, EST AFRICAN AMERICAN: 48 mL/min — AB (ref >60)
GFR, EST NON AFRICAN AMERICAN: 41 mL/min — AB (ref >60)
Glucose, Bld: 265 mg/dL — ABNORMAL HIGH (ref 65–99)
Potassium: 3.9 mmol/L (ref 3.5–5.1)
SODIUM: 137 mmol/L (ref 135–145)

## 2017-03-09 LAB — GLUCOSE, CAPILLARY
Glucose-Capillary: 441 mg/dL — ABNORMAL HIGH (ref 65–99)
Glucose-Capillary: 522 mg/dL (ref 65–99)

## 2017-03-09 LAB — TROPONIN I: TROPONIN I: 0.03 ng/mL — AB (ref <0.03)

## 2017-03-09 LAB — INFLUENZA PANEL BY PCR (TYPE A & B)
INFLBPCR: NEGATIVE
Influenza A By PCR: NEGATIVE

## 2017-03-09 LAB — PROCALCITONIN: Procalcitonin: <0.1 ng/mL

## 2017-03-09 MED ORDER — GLIPIZIDE 5 MG PO TABS
10.0000 mg | ORAL_TABLET | Freq: Every day | ORAL | Status: DC
Start: 2017-03-10 — End: 2017-03-11
  Administered 2017-03-10 – 2017-03-11 (×2): 10 mg via ORAL
  Filled 2017-03-09: qty 1
  Filled 2017-03-09: qty 2
  Filled 2017-03-09: qty 1

## 2017-03-09 MED ORDER — IPRATROPIUM-ALBUTEROL 0.5-2.5 (3) MG/3ML IN SOLN
3.0000 mL | Freq: Once | RESPIRATORY_TRACT | Status: AC
  Administered 2017-03-09: 3 mL via RESPIRATORY_TRACT
  Filled 2017-03-09: qty 3

## 2017-03-09 MED ORDER — DEXTROSE 5 % IV SOLN
1.0000 g | INTRAVENOUS | Status: DC
  Filled 2017-03-09: qty 10

## 2017-03-09 MED ORDER — METHYLPREDNISOLONE SODIUM SUCC 125 MG IJ SOLR
60.0000 mg | Freq: Four times a day (QID) | INTRAMUSCULAR | Status: DC
  Administered 2017-03-09 – 2017-03-10 (×4): 60 mg via INTRAVENOUS
  Filled 2017-03-09 (×4): qty 2

## 2017-03-09 MED ORDER — CEFTRIAXONE SODIUM IN DEXTROSE 20 MG/ML IV SOLN
1.0000 g | Freq: Once | INTRAVENOUS | Status: AC
  Administered 2017-03-09: 1 g via INTRAVENOUS
  Filled 2017-03-09: qty 50

## 2017-03-09 MED ORDER — DEXTROSE 5 % IV SOLN
5.0000 mg/h | INTRAVENOUS | Status: DC
  Administered 2017-03-09: 5 mg/h via INTRAVENOUS
  Filled 2017-03-09 (×2): qty 100

## 2017-03-09 MED ORDER — INSULIN ASPART 100 UNIT/ML ~~LOC~~ SOLN
0.0000 [IU] | Freq: Three times a day (TID) | SUBCUTANEOUS | Status: DC
  Administered 2017-03-09 – 2017-03-10 (×3): 15 [IU] via SUBCUTANEOUS
  Filled 2017-03-09 (×3): qty 1

## 2017-03-09 MED ORDER — ONDANSETRON HCL 4 MG PO TABS: 4.0000 mg | ORAL_TABLET | Freq: Four times a day (QID) | ORAL | Status: DC | PRN

## 2017-03-09 MED ORDER — DEXTROSE 5 % IV SOLN
500.0000 mg | INTRAVENOUS | Status: DC
  Filled 2017-03-09: qty 500

## 2017-03-09 MED ORDER — LINAGLIPTIN 5 MG PO TABS
5.0000 mg | ORAL_TABLET | Freq: Every day | ORAL | Status: DC
  Administered 2017-03-10: 5 mg via ORAL
  Filled 2017-03-09 (×2): qty 1

## 2017-03-09 MED ORDER — ACETAMINOPHEN 650 MG RE SUPP: 650.0000 mg | Freq: Four times a day (QID) | RECTAL | Status: DC | PRN

## 2017-03-09 MED ORDER — CARBIDOPA-LEVODOPA 25-250 MG PO TABS
1.0000 | ORAL_TABLET | Freq: Four times a day (QID) | ORAL | Status: DC
  Administered 2017-03-09 – 2017-03-18 (×32): 1 via ORAL
  Filled 2017-03-09 (×42): qty 1

## 2017-03-09 MED ORDER — IPRATROPIUM-ALBUTEROL 0.5-2.5 (3) MG/3ML IN SOLN
3.0000 mL | Freq: Four times a day (QID) | RESPIRATORY_TRACT | Status: DC
  Administered 2017-03-09 – 2017-03-10 (×5): 3 mL via RESPIRATORY_TRACT
  Filled 2017-03-09 (×5): qty 3

## 2017-03-09 MED ORDER — HYDROCODONE-ACETAMINOPHEN 5-325 MG PO TABS
ORAL_TABLET | ORAL | Status: AC
Start: 2017-03-09 — End: 2017-03-10
  Filled 2017-03-09: qty 1

## 2017-03-09 MED ORDER — LORATADINE 10 MG PO TABS
10.0000 mg | ORAL_TABLET | Freq: Every day | ORAL | Status: DC
  Administered 2017-03-10: 10 mg via ORAL
  Filled 2017-03-09: qty 1

## 2017-03-09 MED ORDER — GABAPENTIN 100 MG PO CAPS
100.0000 mg | ORAL_CAPSULE | Freq: Three times a day (TID) | ORAL | Status: DC
  Administered 2017-03-09 – 2017-03-18 (×23): 100 mg via ORAL
  Filled 2017-03-09 (×23): qty 1

## 2017-03-09 MED ORDER — LISINOPRIL 10 MG PO TABS: 10.0000 mg | ORAL_TABLET | Freq: Every day | ORAL | Status: DC

## 2017-03-09 MED ORDER — APIXABAN 2.5 MG PO TABS
2.5000 mg | ORAL_TABLET | Freq: Two times a day (BID) | ORAL | Status: DC
  Administered 2017-03-09 – 2017-03-10 (×2): 2.5 mg via ORAL
  Filled 2017-03-09 (×2): qty 1

## 2017-03-09 MED ORDER — DEXTROSE 5 % IV SOLN
500.0000 mg | Freq: Once | INTRAVENOUS | Status: AC
  Administered 2017-03-09: 500 mg via INTRAVENOUS
  Filled 2017-03-09: qty 500

## 2017-03-09 MED ORDER — INSULIN ASPART 100 UNIT/ML ~~LOC~~ SOLN
15.0000 [IU] | Freq: Once | SUBCUTANEOUS | Status: AC
  Administered 2017-03-09: 15 [IU] via SUBCUTANEOUS

## 2017-03-09 MED ORDER — MAGNESIUM SULFATE 2 GM/50ML IV SOLN
2.0000 g | Freq: Once | INTRAVENOUS | Status: AC
  Administered 2017-03-09: 2 g via INTRAVENOUS
  Filled 2017-03-09: qty 50

## 2017-03-09 MED ORDER — INSULIN GLARGINE 100 UNIT/ML ~~LOC~~ SOLN
32.0000 [IU] | Freq: Every day | SUBCUTANEOUS | Status: DC
  Administered 2017-03-09 – 2017-03-10 (×2): 32 [IU] via SUBCUTANEOUS
  Filled 2017-03-09 (×2): qty 0.32

## 2017-03-09 MED ORDER — BUDESONIDE 0.25 MG/2ML IN SUSP
0.2500 mg | Freq: Two times a day (BID) | RESPIRATORY_TRACT | Status: DC
  Administered 2017-03-09 – 2017-03-16 (×15): 0.25 mg via RESPIRATORY_TRACT
  Filled 2017-03-09 (×15): qty 2

## 2017-03-09 MED ORDER — SODIUM CHLORIDE 0.9 % IV BOLUS (SEPSIS)
500.0000 mL | Freq: Once | INTRAVENOUS | Status: AC
  Administered 2017-03-09: 500 mL via INTRAVENOUS

## 2017-03-09 MED ORDER — SODIUM CHLORIDE 0.9% FLUSH: 3.0000 mL | INTRAVENOUS | Status: DC | PRN

## 2017-03-09 MED ORDER — ALBUTEROL SULFATE (2.5 MG/3ML) 0.083% IN NEBU
10.0000 mg | INHALATION_SOLUTION | Freq: Once | RESPIRATORY_TRACT | Status: AC
  Administered 2017-03-09: 10 mg via RESPIRATORY_TRACT
  Filled 2017-03-09: qty 12

## 2017-03-09 MED ORDER — GUAIFENESIN ER 600 MG PO TB12
600.0000 mg | ORAL_TABLET | Freq: Two times a day (BID) | ORAL | Status: DC
  Administered 2017-03-09 – 2017-03-18 (×17): 600 mg via ORAL
  Filled 2017-03-09 (×17): qty 1

## 2017-03-09 MED ORDER — INSULIN ASPART 100 UNIT/ML ~~LOC~~ SOLN: 0.0000 [IU] | Freq: Three times a day (TID) | SUBCUTANEOUS | Status: DC

## 2017-03-09 MED ORDER — VALACYCLOVIR HCL 500 MG PO TABS
1000.0000 mg | ORAL_TABLET | Freq: Three times a day (TID) | ORAL | Status: DC
  Administered 2017-03-09 – 2017-03-16 (×15): 1000 mg via ORAL
  Filled 2017-03-09 (×23): qty 2

## 2017-03-09 MED ORDER — SODIUM CHLORIDE 0.9 % IV SOLN: 250.0000 mL | INTRAVENOUS | Status: DC | PRN

## 2017-03-09 MED ORDER — HYDROCODONE-ACETAMINOPHEN 5-325 MG PO TABS
1.0000 | ORAL_TABLET | Freq: Four times a day (QID) | ORAL | Status: DC | PRN
  Administered 2017-03-09 – 2017-03-17 (×5): 1 via ORAL
  Filled 2017-03-09 (×4): qty 1

## 2017-03-09 MED ORDER — DILTIAZEM HCL 25 MG/5ML IV SOLN
20.0000 mg | Freq: Once | INTRAVENOUS | Status: AC
  Administered 2017-03-09: 20 mg via INTRAVENOUS
  Filled 2017-03-09: qty 5

## 2017-03-09 MED ORDER — INSULIN ASPART 100 UNIT/ML ~~LOC~~ SOLN
SUBCUTANEOUS | Status: AC
  Administered 2017-03-09: 15 [IU] via SUBCUTANEOUS
  Filled 2017-03-09: qty 1

## 2017-03-09 MED ORDER — ONDANSETRON HCL 4 MG/2ML IJ SOLN: 4.0000 mg | Freq: Four times a day (QID) | INTRAMUSCULAR | Status: DC | PRN

## 2017-03-09 MED ORDER — DILTIAZEM HCL 100 MG IV SOLR: 20.0000 mg | Freq: Once | INTRAVENOUS | Status: DC

## 2017-03-09 MED ORDER — INSULIN REGULAR HUMAN 100 UNIT/ML IJ SOLN
10.0000 [IU] | Freq: Once | INTRAMUSCULAR | Status: AC
  Administered 2017-03-09: 10 [IU] via INTRAVENOUS
  Filled 2017-03-09: qty 0.1

## 2017-03-09 MED ORDER — FUROSEMIDE 40 MG PO TABS: 40.0000 mg | ORAL_TABLET | Freq: Every day | ORAL | Status: DC

## 2017-03-09 MED ORDER — SODIUM CHLORIDE 0.9% FLUSH
3.0000 mL | Freq: Two times a day (BID) | INTRAVENOUS | Status: DC
  Administered 2017-03-09 – 2017-03-18 (×16): 3 mL via INTRAVENOUS

## 2017-03-09 MED ORDER — ACETAMINOPHEN 325 MG PO TABS
650.0000 mg | ORAL_TABLET | Freq: Four times a day (QID) | ORAL | Status: DC | PRN
  Administered 2017-03-17: 650 mg via ORAL
  Filled 2017-03-09: qty 2

## 2017-03-09 NOTE — ED Notes (Signed)
Pt was changed into gown; new brief, pt made comfortable in bed o2 reapplied

## 2017-03-09 NOTE — ED Triage Notes (Signed)
Pt arrives to ER via [facility] from home c/o weakness and increasing SOB over past few days. Pt wears 2L [place] at baseline. Pt arrives with two PIV in place. Wheezing upon arrival. EMS gave patient 2 duonebs and 125mg  solumedrol IV. Pt alert and oriented X4. Afebrile.

## 2017-03-09 NOTE — ED Notes (Signed)
Pt oxygen level on 3 L nasal cannula was at 87%. Raised to 4 L nasal cannula. Came up to 91%. Will continue to monitor.

## 2017-03-09 NOTE — ED Notes (Signed)
Dr. Allena KatzPatel at pt's bedside for admission assessment.

## 2017-03-09 NOTE — ED Notes (Signed)
Delay to floor due to staffing... rn to transport pt due to cardizem drip.

## 2017-03-09 NOTE — ED Provider Notes (Signed)
[facility] Emergency Department Provider Note  ____________________________________________  Time seen: Approximately 11:45 AM  I have reviewed the triage vital signs and the nursing notes.   HISTORY  Chief Complaint Shortness of Breath and Weakness   HPI Troy [name] is a 83 y.o. male with a history of atrial fibrillation, CHF, CKD, COPD, diabetes, hypertension, and Parkinson's disease who presents for evaluationof shortness of breath and generalized weakness. According to patient and his wife, patient has been feeling sick for 2 days. Generalized weakness, decreased appetite, nausea, a few episodes of nonbloody nonbilious emesis and watery diarrhea, productive cough and progressively worsening shortness of breath. Has not used his inhalers at home. Patient uses 2 L [place] while in bed and has been in bed and using oxygen 24 hours for the last 2 days. No fever but has had chills. The cough is productive of yellow/green sputum. No chest pain, no abdominal pain. Patient has received his flu shot this year.  Past Medical History:  Diagnosis Date  . Chronic atrial fibrillation (HCC)    a. on Couamdin; b. CHADS2VASc at least 7 (CHF, HTN, age x 2, DM, stroke x 2); c. s/p MAZE 1999  . Chronic systolic CHF (congestive heart failure) (HCC)    a. echo 08/01/2015: EF of 45%, mild LVH, mitral valve with annular calcification, mitral valve partially mobile  . CKD (chronic kidney disease), stage III (HCC)   . COPD (chronic obstructive pulmonary disease) (HCC)   . Diabetes mellitus without complication (HCC)   . Hypertension   . Hypothyroidism   . Legally blind   . Mitral valve prolapse    a. s/p mitral valve repair 1999  . Normal coronary arteries    a. by cardiac cath 1999  . Parkinson's disease (HCC)   . Stroke (HCC)   . TIA (transient ischemic attack)   . UTI (lower urinary tract infection)     Patient Active Problem List   Diagnosis Date Noted  . Acute on  chronic respiratory failure (HCC) 05/09/2016  . Dehydration 03/21/2016  . Hypotension 03/21/2016  . Fall 03/21/2016  . Chest wall contusion, left, initial encounter 03/21/2016  . Hypoxia 03/21/2016  . Generalized weakness 03/21/2016  . Leukocytosis 03/21/2016  . Acute on chronic renal failure (HCC) 03/19/2016  . Acute exacerbation of chronic obstructive pulmonary disease (COPD) (HCC) 12/07/2015  . Acute on chronic respiratory failure with hypoxia (HCC) 09/28/2015  . HTN (hypertension) 09/25/2015  . Parkinson's disease (HCC) 09/25/2015  . Hypothyroidism 09/25/2015  . COPD exacerbation (HCC) 09/12/2015  . Diabetes (HCC) 08/20/2015  . SSS (sick sinus syndrome) (HCC) 08/07/2015  . Chronic atrial fibrillation (HCC)   . Sinus pause   . Sick sinus syndrome (HCC)   . Acute on chronic diastolic CHF (congestive heart failure), NYHA class 1 (HCC) 08/03/2015  . Lower extremity weakness 02/25/2015    Past Surgical History:  Procedure Laterality Date  . CARDIAC SURGERY    . CHOLECYSTECTOMY      Prior to Admission medications   Medication Sig Start Date End Date Taking? Authorizing Provider  albuterol (PROVENTIL HFA;VENTOLIN HFA) 108 (90 Base) MCG/ACT inhaler Inhale 2 puffs every 4 (four) hours as needed into the lungs for wheezing or shortness of breath.   Yes [provider]  apixaban (ELIQUIS) 2.5 MG TABS tablet Take 2.5 mg 2 (two) times daily by mouth.   Yes [provider]  carbidopa-levodopa (SINEMET IR) 25-250 MG tablet Take 1 tablet by mouth 4 (four) times  daily.    Yes [provider]  cetirizine (ZYRTEC) 10 MG tablet Take 10 mg by mouth daily.   Yes [provider]  furosemide (LASIX) 40 MG tablet Take 1 tablet (40 mg total) by mouth daily. 03/21/16  Yes Katharina CaperVaickute, Rima, MD  gabapentin (NEURONTIN) 100 MG capsule Take 100 mg 3 (three) times daily by mouth.   Yes [provider]  glipiZIDE (GLUCOTROL) 10 MG tablet Take 10 mg by mouth daily  before breakfast.   Yes [provider]  insulin glargine (LANTUS) 100 UNIT/ML injection Inject 0.32 mLs (32 Units total) into the skin daily. Patient taking differently: Inject 20 Units into the skin at bedtime.  05/14/16  Yes Katha HammingKonidena, Snehalatha, MD  insulin regular (NOVOLIN R,HUMULIN R) 100 units/mL injection Inject 15-25 Units 2 (two) times daily before a meal into the skin.    Yes [provider]  lisinopril (PRINIVIL,ZESTRIL) 10 MG tablet Take 10 mg by mouth daily.   Yes [provider]  potassium chloride (MICRO-K) 10 MEQ CR capsule Take 10 mEq by mouth daily.   Yes [provider]  predniSONE (STERAPRED UNI-PAK 21 TAB) 10 MG (21) TBPK tablet Take 1 tablet (10 mg total) by mouth daily. Take as prescribed Patient taking differently: Take 5 mg by mouth daily. Take as prescribed 05/13/16  Yes Katha HammingKonidena, Snehalatha, MD  saxagliptin HCl (ONGLYZA) 5 MG TABS tablet Take 5 mg by mouth daily.   Yes [provider]  albuterol (PROVENTIL) (2.5 MG/3ML) 0.083% nebulizer solution Take 3 mLs (2.5 mg total) by nebulization every 6 (six) hours as needed for wheezing or shortness of breath. 05/13/16   Katha HammingKonidena, Snehalatha, MD  fluticasone-salmeterol (ADVAIR HFA) 161-09115-21 MCG/ACT inhaler Inhale 2 puffs into the lungs 2 (two) times daily. Patient not taking: Reported on 03/09/2017 12/11/15   Alford HighlandWieting, Richard, MD  HYDROcodone-acetaminophen (NORCO/VICODIN) 5-325 MG tablet Take 1 tablet by mouth every 6 (six) hours as needed for moderate pain. Patient not taking: Reported on 03/09/2017 01/16/17   Emily FilbertWilliams, Jonathan E, MD  insulin aspart (NOVOLOG) 100 UNIT/ML injection Inject 0-5 Units into the skin at bedtime. Patient not taking: Reported on 03/09/2017 05/13/16   Katha HammingKonidena, Snehalatha, MD  metroNIDAZOLE (METROGEL) 0.75 % gel Apply 1 application topically daily.    [provider]  valACYclovir (VALTREX) 1000 MG tablet Take 1 tablet (1,000 mg total) by mouth 3 (three) times  daily. Patient not taking: Reported on 03/09/2017 01/16/17   Emily FilbertWilliams, Jonathan E, MD    Allergies Patient has no known allergies.  Family History  Problem Relation Age of Onset  . Hypertension Mother   . Stroke Mother   . CAD Father   . Lymphoma Sister   . Lung disease Brother     Social History Social History   Tobacco Use  . Smoking status: Former Smoker    Packs/day: 4.00    Years: 20.00    Pack years: 80.00    Types: Cigarettes  . Smokeless tobacco: Never Used  Substance Use Topics  . Alcohol use: No    Alcohol/week: 0.0 oz  . Drug use: No    Review of Systems  Constitutional: Negative for fever. + chills Eyes: Negative for visual changes. ENT: Negative for sore throat. Neck: No neck pain  Cardiovascular: Negative for chest pain. Respiratory: + shortness of breath, wheezing, cough Gastrointestinal: Negative for abdominal pain. + vomiting and diarrhea. Genitourinary: Negative for dysuria. Musculoskeletal: Negative for back pain. Skin: Negative for rash. Neurological: Negative for headaches, weakness or  numbness. Psych: No SI or HI  ____________________________________________   PHYSICAL EXAM:  VITAL SIGNS: ED Triage Vitals [03/09/17 1115]  Enc Vitals Group     BP (!) 119/58     Pulse Rate (!) 122     Resp (!) 30     Temp 99.1 F (37.3 C)     Temp Source Oral     SpO2 92 %     Weight 229 lb (103.9 kg)     Height      Head Circumference      Peak Flow      Pain Score      Pain Loc      Pain Edu?      Excl. in GC?     Constitutional: Alert and oriented, ill appearing.  HEENT:      Head: Normocephalic and atraumatic.         Eyes: Conjunctivae are normal. Sclera is non-icteric.       Mouth/Throat: Mucous membranes are dry.       Neck: Supple with no signs of meningismus. Cardiovascular: Irregularly irregular rhythm, tachycardic rate. No murmurs, gallops, or rubs. 2+ symmetrical distal pulses are present in all extremities. No  JVD. Respiratory: Increased work of breathing, tachypnea, satting in the low 90s on 2 L nasal cannula, severely diminished air movement with diffuse expiratory wheezes throughout..  Gastrointestinal: Soft, non tender, and non distended with positive bowel sounds. No rebound or guarding. Musculoskeletal: Nontender with normal range of motion in all extremities. No edema, cyanosis, or erythema of extremities. Neurologic: Normal speech and language. Face is symmetric. Moving all extremities. No gross focal neurologic deficits are appreciated. Skin: Skin is warm, dry and intact. No rash noted. Psychiatric: Mood and affect are normal. Speech and behavior are normal.  ____________________________________________   LABS (all labs ordered are listed, but only abnormal results are displayed)  Labs Reviewed  BASIC METABOLIC PANEL - Abnormal; Notable for the following components:      Result Value   Chloride 98 (*)    Glucose, Bld 265 (*)    BUN 22 (*)    Creatinine, Ser 1.43 (*)    Calcium 8.3 (*)    GFR calc non Af Amer 41 (*)    GFR calc Af Amer 48 (*)    All other components within normal limits  CBC - Abnormal; Notable for the following components:   RDW 14.9 (*)    Platelets 140 (*)    All other components within normal limits  TROPONIN I - Abnormal; Notable for the following components:   Troponin I 0.03 (*)    All other components within normal limits  URINALYSIS, COMPLETE (UACMP) WITH MICROSCOPIC  INFLUENZA PANEL BY PCR (TYPE A & B)  CBG MONITORING, ED   ____________________________________________  EKG  ED ECG REPORT I, Nita Sickle, the attending physician, personally viewed and interpreted this ECG.  Atrial fibrillation, rate of 125, right bundle branch block, slightly prolonged QTC of 499, normal axis, no ST elevations or depressions, frequent PVCs. No significant changes when compared to prior from September  2018 ____________________________________________  RADIOLOGY  CXR:  Cardiomegaly and mild pulmonary edema. No interval change. ____________________________________________   PROCEDURES  Procedure(s) performed: None Procedures Critical Care performed: yes  CRITICAL CARE Performed by: Nita Sickle  ?  Total critical care time: 35 min  Critical care time was exclusive of separately billable procedures and treating other patients.  Critical care was necessary to treat or prevent imminent or life-threatening  deterioration.  Critical care was time spent personally by me on the following activities: development of treatment plan with patient and/or surrogate as well as nursing, discussions with consultants, evaluation of patient's response to treatment, examination of patient, obtaining history from patient or surrogate, ordering and performing treatments and interventions, ordering and review of laboratory studies, ordering and review of radiographic studies, pulse oximetry and re-evaluation of patient's condition.  ____________________________________________   INITIAL IMPRESSION / ASSESSMENT AND PLAN / ED COURSE  83 y.o. male with a history of atrial fibrillation, CHF, CKD, COPD, diabetes, hypertension, and Parkinson's disease who presents for evaluationof shortness of breath and generalized weakness, decreased appetite, vomiting, diarrhea, productive cough for 2 days. Patient with low-grade fever, productive cough, A. fib with RVR, severely diminished air movement with diffuse expiratory wheezes concerning for pneumonia. Chest x-ray showing mild pulmonary edema however patient looks euvolemic on exam. Patient with slightly increased creatinine. Flu is pending. Patient was given Rocephin and azithromycin, 3 duo nebs, Solu-Medrol and remained significant respiratory distress. Patient was started on 10 mg of albuterol continuous for an hour. Will give 1L NS bolus and cardizem for  rate control since patient is in RVR and receiving duonebs will make it harder to control rate.  We'll admit to the hospitalist service.      As part of my medical decision making, I reviewed the following data within the electronic MEDICAL RECORD NUMBER History obtained from family, Nursing notes reviewed and incorporated, Labs reviewed , EKG interpreted , Old EKG reviewed, Old chart reviewed, Radiograph reviewed , Discussed with admitting physician , Notes from prior ED visits and [place] Controlled Substance Database    Pertinent labs & imaging results that were available during my care of the patient were reviewed by me and considered in my medical decision making (see chart for details).    ____________________________________________   FINAL CLINICAL IMPRESSION(S) / ED DIAGNOSES  Final diagnoses:  COPD exacerbation (HCC)  Acute respiratory failure with hypoxia (HCC)  Community acquired pneumonia, unspecified laterality  Atrial fibrillation with RVR (HCC)      NEW MEDICATIONS STARTED DURING THIS VISIT:  This SmartLink is deprecated. Use AVSMEDLIST instead to display the medication list for a patient.   Note:  This document was prepared using Dragon voice recognition software and may include unintentional dictation errors.    Nita Sickle, MD 03/09/17 [phone]

## 2017-03-09 NOTE — ED Notes (Signed)
Dr. Don PerkingVeronese is aware of pt's Troponin of 0.03

## 2017-03-09 NOTE — ED Notes (Signed)
Report called to steve rn floor nurse 

## 2017-03-09 NOTE — H&P (Signed)
[employer] [facility] - [place] at [facility]   PATIENT NAME: Troy [name]    MR#:  [id number]  DATE OF BIRTH:  [birth year]  DATE OF ADMISSION:  03/09/2017  PRIMARY CARE PHYSICIAN: Barbette Reichmann, MD   REQUESTING/REFERRING PHYSICIAN: Nita Sickle, MD  CHIEF COMPLAINT:   Chief Complaint  Patient presents with  . Shortness of Breath  . Weakness    HISTORY OF PRESENT ILLNESS: Troy [name]  is a 83 y.o. male with a known history of chronic atrial fibrillation, COPD, chronic systolic CHF,, diabetes, essential hypertension, hypothyroidism, chronic respiratory failure, Parkinson's disease who is presenting with shortness of breath and respiratory difficulties.  Patient stated that his symptoms started 2 days ago and has progressively gotten worse.  He has been having significant wheezing and productive cough of yellow-green sputum. Patient came to the ER with the symptoms he is noted to be tachycardic with atrial fibrillation with rapid ventricular rate.  Also tachypneic.  Chest x-ray shows improving infiltrate compared to previously.  Patient also has had some nausea and diarrhea.     PAST MEDICAL HISTORY:   Past Medical History:  Diagnosis Date  . Chronic atrial fibrillation (HCC)    a. on Couamdin; b. CHADS2VASc at least 7 (CHF, HTN, age x 2, DM, stroke x 2); c. s/p MAZE 1999  . Chronic systolic CHF (congestive heart failure) (HCC)    a. echo 08/01/2015: EF of 45%, mild LVH, mitral valve with annular calcification, mitral valve partially mobile  . CKD (chronic kidney disease), stage III (HCC)   . COPD (chronic obstructive pulmonary disease) (HCC)   . Diabetes mellitus without complication (HCC)   . Hypertension   . Hypothyroidism   . Legally blind   . Mitral valve prolapse    a. s/p mitral valve repair 1999  . Normal coronary arteries    a. by cardiac cath 1999  . Parkinson's disease (HCC)   . Stroke (HCC)   . TIA (transient ischemic attack)   . UTI (lower  urinary tract infection)     PAST SURGICAL HISTORY:  Past Surgical History:  Procedure Laterality Date  . CARDIAC SURGERY    . CHOLECYSTECTOMY      SOCIAL HISTORY:  Social History   Tobacco Use  . Smoking status: Former Smoker    Packs/day: 4.00    Years: 20.00    Pack years: 80.00    Types: Cigarettes  . Smokeless tobacco: Never Used  Substance Use Topics  . Alcohol use: No    Alcohol/week: 0.0 oz    FAMILY HISTORY:  Family History  Problem Relation Age of Onset  . Hypertension Mother   . Stroke Mother   . CAD Father   . Lymphoma Sister   . Lung disease Brother     DRUG ALLERGIES: No Known Allergies  REVIEW OF SYSTEMS:   CONSTITUTIONAL: No fever, fatigue or weakness.  EYES: No blurred or double vision.  EARS, NOSE, AND THROAT: No tinnitus or ear pain.  RESPIRATORY: Positive cough, positive shortness of breath, positive wheezing or hemoptysis.  CARDIOVASCULAR: No chest pain, orthopnea, edema.  GASTROINTESTINAL: No nausea, vomiting, diarrhea or abdominal pain.  GENITOURINARY: No dysuria, hematuria.  ENDOCRINE: No polyuria, nocturia,  HEMATOLOGY: No anemia, easy bruising or bleeding SKIN: No rash or lesion. MUSCULOSKELETAL: No joint pain or arthritis.   NEUROLOGIC: No tingling, numbness, weakness.  PSYCHIATRY: No anxiety or depression.   MEDICATIONS AT HOME:  Prior to Admission medications   Medication Sig Start Date End Date Taking?  Authorizing Provider  albuterol (PROVENTIL HFA;VENTOLIN HFA) 108 (90 Base) MCG/ACT inhaler Inhale 2 puffs every 4 (four) hours as needed into the lungs for wheezing or shortness of breath.   Yes [provider]  apixaban (ELIQUIS) 2.5 MG TABS tablet Take 2.5 mg 2 (two) times daily by mouth.   Yes [provider]  carbidopa-levodopa (SINEMET IR) 25-250 MG tablet Take 1 tablet by mouth 4 (four) times daily.    Yes [provider]  cetirizine (ZYRTEC) 10 MG tablet Take 10 mg by mouth daily.   Yes [provider]  furosemide (LASIX) 40 MG tablet Take 1 tablet (40 mg total) by mouth daily. 03/21/16  Yes Katharina Caper, MD  gabapentin (NEURONTIN) 100 MG capsule Take 100 mg 3 (three) times daily by mouth.   Yes [provider]  glipiZIDE (GLUCOTROL) 10 MG tablet Take 10 mg by mouth daily before breakfast.   Yes [provider]  insulin glargine (LANTUS) 100 UNIT/ML injection Inject 0.32 mLs (32 Units total) into the skin daily. Patient taking differently: Inject 20 Units into the skin at bedtime.  05/14/16  Yes Katha Hamming, MD  insulin regular (NOVOLIN R,HUMULIN R) 100 units/mL injection Inject 15-25 Units 2 (two) times daily before a meal into the skin.    Yes [provider]  lisinopril (PRINIVIL,ZESTRIL) 10 MG tablet Take 10 mg by mouth daily.   Yes [provider]  potassium chloride (MICRO-K) 10 MEQ CR capsule Take 10 mEq by mouth daily.   Yes [provider]  predniSONE (STERAPRED UNI-PAK 21 TAB) 10 MG (21) TBPK tablet Take 1 tablet (10 mg total) by mouth daily. Take as prescribed Patient taking differently: Take 5 mg by mouth daily. Take as prescribed 05/13/16  Yes Katha Hamming, MD  saxagliptin HCl (ONGLYZA) 5 MG TABS tablet Take 5 mg by mouth daily.   Yes [provider]  albuterol (PROVENTIL) (2.5 MG/3ML) 0.083% nebulizer solution Take 3 mLs (2.5 mg total) by nebulization every 6 (six) hours as needed for wheezing or shortness of breath. 05/13/16   Katha Hamming, MD  fluticasone-salmeterol (ADVAIR HFA) 161-09 MCG/ACT inhaler Inhale 2 puffs into the lungs 2 (two) times daily. Patient not taking: Reported on 03/09/2017 12/11/15   Alford Highland, MD  HYDROcodone-acetaminophen (NORCO/VICODIN) 5-325 MG tablet Take 1 tablet by mouth every 6 (six) hours as needed for moderate pain. Patient not taking: Reported on 03/09/2017 01/16/17   Emily Filbert, MD  insulin aspart (NOVOLOG) 100 UNIT/ML injection Inject 0-5 Units  into the skin at bedtime. Patient not taking: Reported on 03/09/2017 05/13/16   Katha Hamming, MD  metroNIDAZOLE (METROGEL) 0.75 % gel Apply 1 application topically daily.    [provider]  valACYclovir (VALTREX) 1000 MG tablet Take 1 tablet (1,000 mg total) by mouth 3 (three) times daily. Patient not taking: Reported on 03/09/2017 01/16/17   Emily Filbert, MD      PHYSICAL EXAMINATION:   VITAL SIGNS: Blood pressure (!) 107/56, pulse (!) 108, temperature 99.1 F (37.3 C), temperature source Oral, resp. rate (!) 21, weight 229 lb (103.9 kg), SpO2 95 %.  GENERAL:  83 y.o.-year-old patient lying in the bed with no acute distress.  EYES: Pupils equal, round, reactive to light and accommodation. No scleral icterus. Extraocular muscles intact.  HEENT: Head atraumatic, normocephalic. Oropharynx and nasopharynx clear.  NECK:  Supple, no jugular venous distention. No thyroid enlargement, no tenderness.  LUNGS: Bilateral wheezing throughout both lungs, with the accesorymuscle usage  CARDIOVASCULAR:  Irregularly irregular and tachycardic no murmurs, rubs, or gallops.  ABDOMEN: Soft, nontender, nondistended. Bowel sounds present. No organomegaly or mass.  EXTREMITIES: No pedal edema, cyanosis, or clubbing.  NEUROLOGIC: Cranial nerves II through XII are intact. Muscle strength 5/5 in all extremities. Sensation intact. Gait not checked.  PSYCHIATRIC: The patient is alert and oriented x 3.  SKIN: No obvious rash, lesion, or ulcer.   LABORATORY PANEL:   CBC Recent Labs  Lab 03/09/17 1115  WBC 7.6  HGB 13.9  HCT 41.9  PLT 140*  MCV 93.3  MCH 30.8  MCHC 33.0  RDW 14.9*   ------------------------------------------------------------------------------------------------------------------  Chemistries  Recent Labs  Lab 03/09/17 1115  NA 137  K 3.9  CL 98*  CO2 28  GLUCOSE 265*  BUN 22*  CREATININE 1.43*  CALCIUM 8.3*    ------------------------------------------------------------------------------------------------------------------ estimated creatinine clearance is 40 mL/min (A) (by C-G formula based on SCr of 1.43 mg/dL (H)). ------------------------------------------------------------------------------------------------------------------ No results for input(s): TSH, T4TOTAL, T3FREE, THYROIDAB in the last 72 hours.  Invalid input(s): FREET3   Coagulation profile No results for input(s): INR, PROTIME in the last 168 hours. ------------------------------------------------------------------------------------------------------------------- No results for input(s): DDIMER in the last 72 hours. -------------------------------------------------------------------------------------------------------------------  Cardiac Enzymes Recent Labs  Lab 03/09/17 1115  TROPONINI 0.03*   ------------------------------------------------------------------------------------------------------------------ Invalid input(s): POCBNP  ---------------------------------------------------------------------------------------------------------------  Urinalysis    Component Value Date/Time   COLORURINE YELLOW (A) 01/16/2017 1135   APPEARANCEUR CLEAR (A) 01/16/2017 1135   APPEARANCEUR Clear 06/05/2014 1650   LABSPEC 1.012 01/16/2017 1135   LABSPEC 1.020 06/05/2014 1650   PHURINE 5.0 01/16/2017 1135   GLUCOSEU >=500 (A) 01/16/2017 1135   GLUCOSEU >=500 06/05/2014 1650   HGBUR MODERATE (A) 01/16/2017 1135   BILIRUBINUR NEGATIVE 01/16/2017 1135   BILIRUBINUR Negative 06/05/2014 1650   KETONESUR NEGATIVE 01/16/2017 1135   PROTEINUR NEGATIVE 01/16/2017 1135   NITRITE NEGATIVE 01/16/2017 1135   LEUKOCYTESUR TRACE (A) 01/16/2017 1135   LEUKOCYTESUR Trace 06/05/2014 1650     RADIOLOGY: Dg Chest Portable 1 View  Result Date: 03/09/2017 CLINICAL DATA:  PORTABLE CHEST 1 VIEW COMPARISON:  None. FINDINGS: Sternotomy wires  overlie normal enlarged cardiac silhouette. There is mild airspace disease similar to prior. No focal infiltrate. No pneumothorax. IMPRESSION: Cardiomegaly and mild pulmonary edema.  No interval change. Electronically Signed   By: Genevive BiStewart  Edmunds M.D.   On: 03/09/2017 12:34    EKG: Orders placed or performed during the hospital encounter of 03/09/17  . ED EKG  . ED EKG  . EKG 12-Lead  . EKG 12-Lead    IMPRESSION AND PLAN: Patient is a 83 year old with multiple medical problems with shortness of breath  1.  Acute on chronic respiratory failure This is due to acute on chronic COPD exacerbation with possible pneumonia At this point I would treat him with IV ceftriaxone as well as azithromycin, check pro calcitonin levels   2.  Acute on chronic COPD PD exacerbation We will treat with nebulizer therapy every 6 hours I will place him on Pulmicort nebs IV Solu-Medrol every 6 hours Treat him with Mucinex  3.  Atrial fibrillation with rapid ventricular rate Due to heart rate being under poor control I would have the patient place him on Cardizem drip Continue Eliquis Ask his primary cardiologist to see  4.  Diabetes type 2 Continue his home regimen Place on sliding scale insulin  5.  Chronic systolic CHF currently appears compensated Continue Lasix  6.  Chronic kidney disease stage III Monitor renal function  7.  Miscellaneous Eliquis for DVT prophylaxis  8.  Code DNR confirmed with patient and wife   All the records are reviewed and case discussed with ED provider. Management plans discussed with the patient, family and they are in agreement.  CODE STATUS: Code Status History    Date Active Date Inactive Code Status Order ID Comments User Context   05/09/2016 15:37 05/13/2016 18:31 DNR [id number]  Katha Hamming, MD ED   03/19/2016 16:54 03/21/2016 18:26 DNR [id number]  Gracelyn Nurse, MD Inpatient   12/14/2015 12:09 12/16/2015 19:58 DNR [id number]  Altamese Dilling, MD Inpatient   12/13/2015 20:55 12/14/2015 12:09 Full Code [id number]  Tonye Royalty, DO ED   12/13/2015 20:50 12/13/2015 20:55 DNR [id number]  Tonye Royalty, DO ED   12/07/2015 06:27 12/11/2015 17:06 DNR [id number]  Ihor Austin, MD ED   09/25/2015 01:18 09/28/2015 20:53 DNR [id number]  Oralia Manis, MD Inpatient   08/07/2015 19:50 08/09/2015 15:23 DNR [id number]  Darrol Jump, PA-C Inpatient   08/03/2015 16:58 08/07/2015 19:50 DNR [id number]  Houston Siren, MD ED   02/25/2015 19:56 03/01/2015 19:21 Full Code [id number]  Gale Journey, MD ED    Questions for Most Recent Historical Code Status (Order [id number])    Question Answer Comment   In the event of cardiac or respiratory ARREST Do not call a "code blue"    In the event of cardiac or respiratory ARREST Do not perform Intubation, CPR, defibrillation or ACLS    In the event of cardiac or respiratory ARREST Use medication by any route, position, wound care, and other measures to relive pain and suffering. May use oxygen, suction and manual treatment of airway obstruction as needed for comfort.        TOTAL TIME TAKING CARE OF THIS PATIENT: .  Care time spent   Auburn Bilberry M.D on 03/09/2017 at 1:04 PM  Between 7am to 6pm - Pager - [phone]  After 6pm go to [email or web address] - password [email or web address] [facility]  [facility]  Office  [phone]  CC: Primary care physician; Barbette Reichmann, MD

## 2017-03-09 NOTE — ED Notes (Signed)
Pt was given swabs to help with dry mouth;this tech also informed the Jae DireKate, rn of pt pain on left shoulder due to previous shingles pt has had for 2 months;admitting dr also notified; pt family also informed we are striving to get him an admission bed that would be better for conformability.

## 2017-03-09 NOTE — ED Notes (Signed)
Patient cleaned and brief changed by this RN, Amy-RN and crystal-Tech

## 2017-03-09 NOTE — ED Notes (Signed)
Spoke with MD Allena KatzPatel. Relayed patient blood sugar 441. Per MD give 15 units insulin

## 2017-03-10 ENCOUNTER — Inpatient Hospital Stay: Payer: Medicare Other

## 2017-03-10 ENCOUNTER — Other Ambulatory Visit: Payer: Self-pay

## 2017-03-10 ENCOUNTER — Inpatient Hospital Stay (HOSPITAL_COMMUNITY)
Admit: 2017-03-10 | Discharge: 2017-03-10 | Disposition: A | Discharge status: Home / Self Care | Payer: Medicare Other | Attending: Physician Assistant | Admitting: Physician Assistant

## 2017-03-10 DIAGNOSIS — I341 Nonrheumatic mitral (valve) prolapse: Secondary | ICD-10-CM | POA: Diagnosis present

## 2017-03-10 DIAGNOSIS — J441 Chronic obstructive pulmonary disease with (acute) exacerbation: Secondary | ICD-10-CM | POA: Diagnosis present

## 2017-03-10 DIAGNOSIS — N179 Acute kidney failure, unspecified: Secondary | ICD-10-CM

## 2017-03-10 DIAGNOSIS — J9621 Acute and chronic respiratory failure with hypoxia: Secondary | ICD-10-CM | POA: Diagnosis present

## 2017-03-10 DIAGNOSIS — E039 Hypothyroidism, unspecified: Secondary | ICD-10-CM | POA: Diagnosis present

## 2017-03-10 DIAGNOSIS — N183 Chronic kidney disease, stage 3 (moderate): Secondary | ICD-10-CM | POA: Diagnosis present

## 2017-03-10 DIAGNOSIS — Z7189 Other specified counseling: Secondary | ICD-10-CM

## 2017-03-10 DIAGNOSIS — I5033 Acute on chronic diastolic (congestive) heart failure: Secondary | ICD-10-CM | POA: Diagnosis present

## 2017-03-10 DIAGNOSIS — Z7901 Long term (current) use of anticoagulants: Secondary | ICD-10-CM | POA: Diagnosis not present

## 2017-03-10 DIAGNOSIS — J810 Acute pulmonary edema: Secondary | ICD-10-CM | POA: Diagnosis not present

## 2017-03-10 DIAGNOSIS — L89609 Pressure ulcer of unspecified heel, unspecified stage: Secondary | ICD-10-CM | POA: Diagnosis present

## 2017-03-10 DIAGNOSIS — Z951 Presence of aortocoronary bypass graft: Secondary | ICD-10-CM | POA: Diagnosis not present

## 2017-03-10 DIAGNOSIS — R0600 Dyspnea, unspecified: Secondary | ICD-10-CM

## 2017-03-10 DIAGNOSIS — J189 Pneumonia, unspecified organism: Secondary | ICD-10-CM | POA: Diagnosis present

## 2017-03-10 DIAGNOSIS — E871 Hypo-osmolality and hyponatremia: Secondary | ICD-10-CM | POA: Diagnosis not present

## 2017-03-10 DIAGNOSIS — Z66 Do not resuscitate: Secondary | ICD-10-CM | POA: Diagnosis present

## 2017-03-10 DIAGNOSIS — E1122 Type 2 diabetes mellitus with diabetic chronic kidney disease: Secondary | ICD-10-CM | POA: Diagnosis present

## 2017-03-10 DIAGNOSIS — J9601 Acute respiratory failure with hypoxia: Secondary | ICD-10-CM | POA: Diagnosis not present

## 2017-03-10 DIAGNOSIS — E1140 Type 2 diabetes mellitus with diabetic neuropathy, unspecified: Secondary | ICD-10-CM | POA: Diagnosis present

## 2017-03-10 DIAGNOSIS — I4891 Unspecified atrial fibrillation: Secondary | ICD-10-CM

## 2017-03-10 DIAGNOSIS — J440 Chronic obstructive pulmonary disease with acute lower respiratory infection: Secondary | ICD-10-CM | POA: Diagnosis present

## 2017-03-10 DIAGNOSIS — I130 Hypertensive heart and chronic kidney disease with heart failure and stage 1 through stage 4 chronic kidney disease, or unspecified chronic kidney disease: Secondary | ICD-10-CM | POA: Diagnosis present

## 2017-03-10 DIAGNOSIS — I482 Chronic atrial fibrillation: Secondary | ICD-10-CM

## 2017-03-10 DIAGNOSIS — I5032 Chronic diastolic (congestive) heart failure: Secondary | ICD-10-CM

## 2017-03-10 DIAGNOSIS — G20 Parkinson's disease: Secondary | ICD-10-CM | POA: Diagnosis present

## 2017-03-10 DIAGNOSIS — E11649 Type 2 diabetes mellitus with hypoglycemia without coma: Secondary | ICD-10-CM | POA: Diagnosis present

## 2017-03-10 DIAGNOSIS — E872 Acidosis: Secondary | ICD-10-CM | POA: Diagnosis not present

## 2017-03-10 DIAGNOSIS — I959 Hypotension, unspecified: Secondary | ICD-10-CM | POA: Diagnosis present

## 2017-03-10 DIAGNOSIS — E1165 Type 2 diabetes mellitus with hyperglycemia: Secondary | ICD-10-CM | POA: Diagnosis present

## 2017-03-10 DIAGNOSIS — E875 Hyperkalemia: Secondary | ICD-10-CM | POA: Diagnosis not present

## 2017-03-10 LAB — URINALYSIS, COMPLETE (UACMP) WITH MICROSCOPIC
BILIRUBIN URINE: NEGATIVE
Bacteria, UA: NONE SEEN
Glucose, UA: 50 mg/dL — AB
Ketones, ur: 5 mg/dL — AB
Nitrite: NEGATIVE
PH: 5 (ref 5.0–8.0)
Protein, ur: 30 mg/dL — AB
SPECIFIC GRAVITY, URINE: 1.017 (ref 1.005–1.030)

## 2017-03-10 LAB — GLUCOSE, CAPILLARY
GLUCOSE-CAPILLARY: 522 mg/dL — AB (ref 65–99)
GLUCOSE-CAPILLARY: 406 mg/dL — AB (ref 65–99)
GLUCOSE-CAPILLARY: 375 mg/dL — AB (ref 65–99)
GLUCOSE-CAPILLARY: 406 mg/dL — AB (ref 65–99)
GLUCOSE-CAPILLARY: 417 mg/dL — AB (ref 65–99)
GLUCOSE-CAPILLARY: 383 mg/dL — AB (ref 65–99)
GLUCOSE-CAPILLARY: 390 mg/dL — AB (ref 65–99)
Glucose-Capillary: 496 mg/dL — ABNORMAL HIGH (ref 65–99)
Glucose-Capillary: 549 mg/dL (ref 65–99)
Glucose-Capillary: 485 mg/dL — ABNORMAL HIGH (ref 65–99)
Glucose-Capillary: 414 mg/dL — ABNORMAL HIGH (ref 65–99)
Glucose-Capillary: 341 mg/dL — ABNORMAL HIGH (ref 65–99)

## 2017-03-10 LAB — CBC
HCT: 37.9 % — ABNORMAL LOW (ref 40.0–52.0)
Hemoglobin: 12.3 g/dL — ABNORMAL LOW (ref 13.0–18.0)
MCH: 30.4 pg (ref 26.0–34.0)
MCHC: 32.4 g/dL (ref 32.0–36.0)
MCV: 93.8 fL (ref 80.0–100.0)
PLATELETS: 152 10*3/uL (ref 150–440)
RBC: 4.04 MIL/uL — ABNORMAL LOW (ref 4.40–5.90)
RDW: 14.8 % — AB (ref 11.5–14.5)
WBC: 13 10*3/uL — AB (ref 3.8–10.6)

## 2017-03-10 LAB — ECHOCARDIOGRAM COMPLETE
Height: 69.5 in
Weight: 3883.62 oz

## 2017-03-10 LAB — BASIC METABOLIC PANEL
Anion gap: 14 (ref 5–15)
BUN: 57 mg/dL — AB (ref 6–20)
CO2: 22 mmol/L (ref 22–32)
Calcium: 8.3 mg/dL — ABNORMAL LOW (ref 8.9–10.3)
Chloride: 92 mmol/L — ABNORMAL LOW (ref 101–111)
Creatinine, Ser: 4.9 mg/dL — ABNORMAL HIGH (ref 0.61–1.24)
GFR calc Af Amer: 11 mL/min — ABNORMAL LOW (ref >60)
GFR, EST NON AFRICAN AMERICAN: 9 mL/min — AB (ref >60)
GLUCOSE: 429 mg/dL — AB (ref 65–99)
POTASSIUM: 4.8 mmol/L (ref 3.5–5.1)
Sodium: 128 mmol/L — ABNORMAL LOW (ref 135–145)

## 2017-03-10 LAB — MRSA PCR SCREENING: MRSA by PCR: NEGATIVE

## 2017-03-10 LAB — CREATININE, SERUM
Creatinine, Ser: 5.06 mg/dL — ABNORMAL HIGH (ref 0.61–1.24)
GFR calc Af Amer: 10 mL/min — ABNORMAL LOW (ref >60)
GFR calc non Af Amer: 9 mL/min — ABNORMAL LOW (ref >60)

## 2017-03-10 LAB — MAGNESIUM: MAGNESIUM: 2.3 mg/dL (ref 1.7–2.4)

## 2017-03-10 MED ORDER — ALBUTEROL SULFATE (5 MG/ML) 0.5% IN NEBU: 2.5000 mg | INHALATION_SOLUTION | RESPIRATORY_TRACT | Status: DC | PRN

## 2017-03-10 MED ORDER — PHENYLEPHRINE HCL 10 MG/ML IJ SOLN
0.0000 ug/min | Freq: Once | INTRAVENOUS | Status: DC
  Filled 2017-03-10: qty 1

## 2017-03-10 MED ORDER — SODIUM CHLORIDE 0.45 % IV SOLN: INTRAVENOUS | Status: DC

## 2017-03-10 MED ORDER — SODIUM CHLORIDE 0.9 % IV SOLN
INTRAVENOUS | Status: DC
  Administered 2017-03-10: 4.6 [IU]/h via INTRAVENOUS
  Administered 2017-03-10 – 2017-03-11 (×2): 23.1 [IU]/h via INTRAVENOUS
  Filled 2017-03-10 (×4): qty 1

## 2017-03-10 MED ORDER — DEXTROSE 50 % IV SOLN
25.0000 mL | INTRAVENOUS | Status: DC | PRN
  Administered 2017-03-11: 13 mL via INTRAVENOUS
  Filled 2017-03-10: qty 50

## 2017-03-10 MED ORDER — INSULIN ASPART 100 UNIT/ML ~~LOC~~ SOLN
6.0000 [IU] | Freq: Three times a day (TID) | SUBCUTANEOUS | Status: DC
  Administered 2017-03-10 (×2): 6 [IU] via SUBCUTANEOUS
  Filled 2017-03-10 (×2): qty 1

## 2017-03-10 MED ORDER — FUROSEMIDE 10 MG/ML IJ SOLN
40.0000 mg | Freq: Once | INTRAMUSCULAR | Status: AC
  Administered 2017-03-10: 40 mg via INTRAVENOUS
  Filled 2017-03-10: qty 4

## 2017-03-10 MED ORDER — ALBUTEROL SULFATE (2.5 MG/3ML) 0.083% IN NEBU: 2.5000 mg | INHALATION_SOLUTION | RESPIRATORY_TRACT | Status: DC | PRN

## 2017-03-10 MED ORDER — SODIUM CHLORIDE 0.9 % IV SOLN
0.0000 ug/min | INTRAVENOUS | Status: DC
  Administered 2017-03-10: 20 ug/min via INTRAVENOUS
  Filled 2017-03-10: qty 1
  Filled 2017-03-10: qty 10

## 2017-03-10 MED ORDER — AMIODARONE IV BOLUS ONLY 150 MG/100ML
150.0000 mg | INTRAVENOUS | Status: DC | PRN
  Administered 2017-03-11: 150 mg via INTRAVENOUS
  Filled 2017-03-10 (×3): qty 100

## 2017-03-10 MED ORDER — IPRATROPIUM BROMIDE 0.02 % IN SOLN
0.5000 mg | Freq: Four times a day (QID) | RESPIRATORY_TRACT | Status: DC
  Administered 2017-03-10 – 2017-03-11 (×3): 0.5 mg via RESPIRATORY_TRACT
  Filled 2017-03-10 (×3): qty 2.5

## 2017-03-10 MED ORDER — INSULIN REGULAR BOLUS VIA INFUSION
0.0000 [IU] | Freq: Three times a day (TID) | INTRAVENOUS | Status: DC
  Filled 2017-03-10: qty 10

## 2017-03-10 MED ORDER — LEVALBUTEROL HCL 0.63 MG/3ML IN NEBU
0.6300 mg | INHALATION_SOLUTION | Freq: Four times a day (QID) | RESPIRATORY_TRACT | Status: DC
  Administered 2017-03-10 – 2017-03-11 (×3): 0.63 mg via RESPIRATORY_TRACT
  Filled 2017-03-10 (×3): qty 3

## 2017-03-10 NOTE — Progress Notes (Signed)
[facility]  [facility] 14

## 2017-03-10 NOTE — Consult Note (Signed)
[facility]  CONSULT NOTE    Date: 03/10/2017                  Patient Name:  Troy [name]  MRN: [id number]  DOB: 1923/08/25  Age / Sex: 83 y.o., male         PCP: Barbette Reichmann, MD                 Service Requesting Consult: Dr. Elpidio Anis                 Reason for Consult: Acute renal failure            History of Present Illness: Troy [name] is a 83 y.o. white male with hypertension, COPD, coronary artery disease status post CABG, congestive heart failure, atrial fibrillation, AICD placement, CVA, parkinson's, hypothyroidism, diabetes mellitus type II, who was admitted to [facility] on 03/09/2017 for COPD exacerbation (HCC) [J44.1] Acute respiratory failure with hypoxia (HCC) [J96.01] Atrial fibrillation with RVR (HCC) [I48.91] Community acquired pneumonia, unspecified laterality [J18.9]   Patient with creatinine of 3.77 today. On admission, creatinine of 1.43. Patient was given furosemide 40mg  IV this morning.   No IV contrast exposure.   Wife at bedside.    Medications: Outpatient medications: Medications Prior to Admission  Medication Sig Dispense Refill Last Dose  . albuterol (PROVENTIL HFA;VENTOLIN HFA) 108 (90 Base) MCG/ACT inhaler Inhale 2 puffs every 4 (four) hours as needed into the lungs for wheezing or shortness of breath.   prn at prn  . apixaban (ELIQUIS) 2.5 MG TABS tablet Take 2.5 mg 2 (two) times daily by mouth.   03/09/2017 at 0800  . carbidopa-levodopa (SINEMET IR) 25-250 MG tablet Take 1 tablet by mouth 4 (four) times daily.    03/09/2017 at 0800  . cetirizine (ZYRTEC) 10 MG tablet Take 10 mg by mouth daily.   03/09/2017 at 0800  . furosemide (LASIX) 40 MG tablet Take 1 tablet (40 mg total) by mouth daily. 30 tablet 6 03/09/2017 at 0800  . gabapentin (NEURONTIN) 100 MG capsule Take 100 mg 3 (three) times daily by mouth.   03/09/2017 at 0800  . glipiZIDE (GLUCOTROL) 10 MG tablet Take 10 mg by mouth daily before breakfast.    03/09/2017 at 0800  . insulin glargine (LANTUS) 100 UNIT/ML injection Inject 0.32 mLs (32 Units total) into the skin daily. (Patient taking differently: Inject 20 Units into the skin at bedtime. ) 10 mL 11 03/08/2017 at 2200  . insulin regular (NOVOLIN R,HUMULIN R) 100 units/mL injection Inject 15-25 Units 2 (two) times daily before a meal into the skin.    03/09/2017 at Unknown time  . lisinopril (PRINIVIL,ZESTRIL) 10 MG tablet Take 10 mg by mouth daily.   03/08/2017 at 2000  . potassium chloride (MICRO-K) 10 MEQ CR capsule Take 10 mEq by mouth daily.   03/08/2017 at 1200  . predniSONE (STERAPRED UNI-PAK 21 TAB) 10 MG (21) TBPK tablet Take 1 tablet (10 mg total) by mouth daily. Take as prescribed (Patient taking differently: Take 5 mg by mouth daily. Take as prescribed) 21 tablet 0 03/09/2017 at 0800  . saxagliptin HCl (ONGLYZA) 5 MG TABS tablet Take 5 mg by mouth daily.   03/09/2017 at 0800  . albuterol (PROVENTIL) (2.5 MG/3ML) 0.083% nebulizer solution Take 3 mLs (2.5 mg total) by nebulization every 6 (six) hours as needed for wheezing or shortness of breath. 75 mL 12 prn at prn  . fluticasone-salmeterol (ADVAIR HFA) 115-21 MCG/ACT  inhaler Inhale 2 puffs into the lungs 2 (two) times daily. (Patient not taking: Reported on 03/09/2017) 1 Inhaler 0 Not Taking at Unknown time  . HYDROcodone-acetaminophen (NORCO/VICODIN) 5-325 MG tablet Take 1 tablet by mouth every 6 (six) hours as needed for moderate pain. (Patient not taking: Reported on 03/09/2017) 20 tablet 0 Completed Course at Unknown time  . insulin aspart (NOVOLOG) 100 UNIT/ML injection Inject 0-5 Units into the skin at bedtime. (Patient not taking: Reported on 03/09/2017) 10 mL 11 Not Taking at Unknown time  . metroNIDAZOLE (METROGEL) 0.75 % gel Apply 1 application topically daily.   prn at prn  . valACYclovir (VALTREX) 1000 MG tablet Take 1 tablet (1,000 mg total) by mouth 3 (three) times daily. (Patient not taking: Reported on 03/09/2017) 21 tablet 0 Not  Taking at Unknown time    Current medications: Current Facility-Administered Medications  Medication Dose Route Frequency Provider Last Rate Last Dose  . 0.9 %  sodium chloride infusion  250 mL Intravenous PRN Auburn Bilberry, MD      . acetaminophen (TYLENOL) tablet 650 mg  650 mg Oral Q6H PRN Auburn Bilberry, MD       Or  . acetaminophen (TYLENOL) suppository 650 mg  650 mg Rectal Q6H PRN Auburn Bilberry, MD      . albuterol (PROVENTIL) (2.5 MG/3ML) 0.083% nebulizer solution 2.5 mg  2.5 mg Nebulization Q4H PRN Sudini, Wardell Heath, MD      . apixaban Everlene Balls) tablet 2.5 mg  2.5 mg Oral BID Auburn Bilberry, MD   2.5 mg at 03/10/17 1153  . budesonide (PULMICORT) nebulizer solution 0.25 mg  0.25 mg Nebulization BID Auburn Bilberry, MD   0.25 mg at 03/10/17 0731  . carbidopa-levodopa (SINEMET IR) 25-250 MG per tablet immediate release 1 tablet  1 tablet Oral QID Auburn Bilberry, MD   1 tablet at 03/10/17 1152  . gabapentin (NEURONTIN) capsule 100 mg  100 mg Oral TID Auburn Bilberry, MD   100 mg at 03/10/17 1153  . glipiZIDE (GLUCOTROL) tablet 10 mg  10 mg Oral QAC breakfast Auburn Bilberry, MD   10 mg at 03/10/17 0930  . guaiFENesin (MUCINEX) 12 hr tablet 600 mg  600 mg Oral BID Auburn Bilberry, MD   600 mg at 03/10/17 1153  . HYDROcodone-acetaminophen (NORCO/VICODIN) 5-325 MG per tablet 1 tablet  1 tablet Oral Q6H PRN Auburn Bilberry, MD   1 tablet at 03/09/17 1557  . insulin aspart (novoLOG) injection 0-15 Units  0-15 Units Subcutaneous TID AC & HS Oralia Manis, MD   15 Units at 03/10/17 1206  . insulin aspart (novoLOG) injection 6 Units  6 Units Subcutaneous TID WC Milagros Loll, MD   6 Units at 03/10/17 1206  . insulin glargine (LANTUS) injection 32 Units  32 Units Subcutaneous Daily Auburn Bilberry, MD   32 Units at 03/10/17 0930  . ipratropium-albuterol (DUONEB) 0.5-2.5 (3) MG/3ML nebulizer solution 3 mL  3 mL Nebulization Q6H Auburn Bilberry, MD   3 mL at 03/10/17 0731  . linagliptin  (TRADJENTA) tablet 5 mg  5 mg Oral Daily Auburn Bilberry, MD   5 mg at 03/10/17 1153  . loratadine (CLARITIN) tablet 10 mg  10 mg Oral Daily Auburn Bilberry, MD   10 mg at 03/10/17 1153  . methylPREDNISolone sodium succinate (SOLU-MEDROL) 125 mg/2 mL injection 60 mg  60 mg Intravenous Q6H Auburn Bilberry, MD   60 mg at 03/10/17 0930  . ondansetron (ZOFRAN) tablet 4 mg  4 mg Oral Q6H PRN Auburn Bilberry,  MD       Or  . ondansetron (ZOFRAN) injection 4 mg  4 mg Intravenous Q6H PRN Auburn BilberryPatel, Shreyang, MD      . sodium chloride flush (NS) 0.9 % injection 3 mL  3 mL Intravenous Q12H Auburn BilberryPatel, Shreyang, MD   3 mL at 03/10/17 1201  . sodium chloride flush (NS) 0.9 % injection 3 mL  3 mL Intravenous PRN Auburn BilberryPatel, Shreyang, MD      . valACYclovir (VALTREX) tablet 1,000 mg  1,000 mg Oral TID Auburn BilberryPatel, Shreyang, MD   1,000 mg at 03/10/17 1152      Allergies: No Known Allergies    Past Medical History: Past Medical History:  Diagnosis Date  . Chronic atrial fibrillation (HCC)    a. on Couamdin; b. CHADS2VASc at least 7 (CHF, HTN, age x 2, DM, stroke x 2); c. s/p MAZE 1999  . Chronic systolic CHF (congestive heart failure) (HCC)    a. echo 08/01/2015: EF of 45%, mild LVH, mitral valve with annular calcification, mitral valve partially mobile  . CKD (chronic kidney disease), stage III (HCC)   . COPD (chronic obstructive pulmonary disease) (HCC)   . Diabetes mellitus without complication (HCC)   . Hypertension   . Hypothyroidism   . Legally blind   . Mitral valve prolapse    a. s/p mitral valve repair 1999  . Normal coronary arteries    a. by cardiac cath 1999  . Parkinson's disease (HCC)   . Stroke (HCC)   . TIA (transient ischemic attack)   . UTI (lower urinary tract infection)      Past Surgical History: Past Surgical History:  Procedure Laterality Date  . CARDIAC SURGERY    . CHOLECYSTECTOMY       Family History: Family History  Problem Relation Age of Onset  . Hypertension Mother   .  Stroke Mother   . CAD Father   . Lymphoma Sister   . Lung disease Brother      Social History: Social History   Socioeconomic History  . Marital status: Married    Spouse name: Not on file  . Number of children: Not on file  . Years of education: Not on file  . Highest education level: Not on file  Social Needs  . Financial resource strain: Not on file  . Food insecurity - worry: Not on file  . Food insecurity - inability: Not on file  . Transportation needs - medical: Not on file  . Transportation needs - non-medical: Not on file  Occupational History  . Occupation: retired  Tobacco Use  . Smoking status: Former Smoker    Packs/day: 4.00    Years: 20.00    Pack years: 80.00    Types: Cigarettes  . Smokeless tobacco: Never Used  Substance and Sexual Activity  . Alcohol use: No    Alcohol/week: 0.0 oz  . Drug use: No  . Sexual activity: Not on file  Other Topics Concern  . Not on file  Social History Narrative  . Not on file     Review of Systems: Review of Systems  Constitutional: Negative.  Negative for chills, diaphoresis, fever, malaise/fatigue and weight loss.  HENT: Negative.  Negative for congestion, ear discharge, ear pain, hearing loss, nosebleeds, sinus pain, sore throat and tinnitus.   Eyes: Negative.  Negative for blurred vision, double vision, photophobia, pain, discharge and redness.  Respiratory: Positive for cough, sputum production, shortness of breath and wheezing. Negative for hemoptysis and stridor.  Cardiovascular: Negative.  Negative for chest pain, palpitations, orthopnea, claudication, leg swelling and PND.  Gastrointestinal: Negative.  Negative for abdominal pain, blood in stool, constipation, diarrhea, heartburn, melena, nausea and vomiting.  Genitourinary: Negative.  Negative for dysuria, flank pain, frequency, hematuria and urgency.  Musculoskeletal: Negative.  Negative for back pain, falls, joint pain, myalgias and neck pain.  Skin:  Negative.  Negative for itching and rash.  Neurological: Negative.  Negative for dizziness, tingling, tremors, sensory change, speech change, focal weakness, seizures, loss of consciousness, weakness and headaches.  Endo/Heme/Allergies: Negative.  Negative for environmental allergies and polydipsia. Does not bruise/bleed easily.  Psychiatric/Behavioral: Negative.  Negative for depression, hallucinations, memory loss, substance abuse and suicidal ideas. The patient is not nervous/anxious and does not have insomnia.     Vital Signs: Blood pressure (!) 90/42, pulse (!) 106, temperature 98.5 F (36.9 C), temperature source Axillary, resp. rate 20, height 5' 5.5" (1.664 m), weight 108.5 kg (239 lb 3.2 oz), SpO2 90 %.  Weight trends: Filed Weights   03/09/17 1115 03/09/17 1914  Weight: 103.9 kg (229 lb) 108.5 kg (239 lb 3.2 oz)    Physical Exam: General: NAD, sitting up in bed  Head: Normocephalic, atraumatic. Moist oral mucosal membranes  Eyes: Anicteric, PERRL  Neck: Supple, trachea midline  Lungs:  Bibasilar crackles  Heart: Regular rate and rhythm  Abdomen:  Soft, nontender,   Extremities: trace peripheral edema.  Neurologic: Nonfocal, moving all four extremities  Skin: No lesions        Lab results: Basic Metabolic Panel: Recent Labs  Lab 03/09/17 1115 03/10/17 0431  NA 137 132*  K 3.9 4.0  CL 98* 95*  CO2 28 23  GLUCOSE 265* 545*  BUN 22* 42*  CREATININE 1.43* 3.77*  CALCIUM 8.3* 8.2*  MG  --  2.3    Liver Function Tests: No results for input(s): AST, ALT, ALKPHOS, BILITOT, PROT, ALBUMIN in the last 168 hours. No results for input(s): LIPASE, AMYLASE in the last 168 hours. No results for input(s): AMMONIA in the last 168 hours.  CBC: Recent Labs  Lab 03/09/17 1115 03/10/17 0431  WBC 7.6 13.0*  HGB 13.9 12.3*  HCT 41.9 37.9*  MCV 93.3 93.8  PLT 140* 152    Cardiac Enzymes: Recent Labs  Lab 03/09/17 1115  TROPONINI 0.03*    BNP: Invalid input(s):  POCBNP  CBG: Recent Labs  Lab 03/09/17 1608 03/09/17 2046 03/10/17 0545 03/10/17 0849 03/10/17 1139  GLUCAP 441* 522* 496* 549* 485*    Microbiology: Results for orders placed or performed during the hospital encounter of 05/09/16  MRSA PCR Screening     Status: Abnormal   Collection Time: 05/09/16  7:37 PM  Result Value Ref Range Status   MRSA by PCR (A) NEGATIVE Final    INVALID, UNABLE TO DETERMINE THE PRESENCE OF TARGET DNA DUE TO SPECIMEN INTEGRITY. RECOLLECTION REQUESTED.    Comment:        The GeneXpert MRSA Assay (FDA approved for NASAL specimens only), is one component of a comprehensive MRSA colonization surveillance program. It is not intended to diagnose MRSA infection nor to guide or monitor treatment for MRSA infections. C/DRUSILLA JACKSON AT 2248 05/09/16.PMH   MRSA PCR Screening     Status: Abnormal   Collection Time: 05/10/16  5:20 AM  Result Value Ref Range Status   MRSA by PCR (A) NEGATIVE Final    INVALID, UNABLE TO DETERMINE THE PRESENCE OF TARGET DNA DUE TO SPECIMEN INTEGRITY. RECOLLECTION REQUESTED.  Comment: RESULT CALLED TO, READ BACK BY AND VERIFIED WITH: CHRIS BENNETT AT 8:09 ON 05/10/2016 JLJ   MRSA PCR Screening     Status: None   Collection Time: 05/10/16 10:27 AM  Result Value Ref Range Status   MRSA by PCR NEGATIVE NEGATIVE Final    Comment:        The GeneXpert MRSA Assay (FDA approved for NASAL specimens only), is one component of a comprehensive MRSA colonization surveillance program. It is not intended to diagnose MRSA infection nor to guide or monitor treatment for MRSA infections.     Coagulation Studies: No results for input(s): LABPROT, INR in the last 72 hours.  Urinalysis: Recent Labs    03/10/17 0612  COLORURINE AMBER*  LABSPEC 1.017  PHURINE 5.0  GLUCOSEU 50*  HGBUR MODERATE*  BILIRUBINUR NEGATIVE  KETONESUR 5*  PROTEINUR 30*  NITRITE NEGATIVE  LEUKOCYTESUR TRACE*      Imaging: US Renal  Result  Date: 03/10/2017 CLINICAL DATA:  83 year old male with acute renal failure. Initial encounter. EXAM: RENAL / URINARY TRACT ULTRASOUND COMPLETE COMPARISON:  01/16/2017 CT. FINDINGS: Right Kidney: Length: 15.6 cm. No hydronephrosis. Multiple cysts measuring up to 6.1 cm. Left Kidney: Length: 13.2 cm. No hydronephrosis. Multiple cysts measuring up to 9.2 cm. Bladder: Not visualized secondary to under distension and bowel gas. IMPRESSION: No hydronephrosis.  Large bilateral renal cysts. Electronically Signed   By: Lacy Duverney M.D.   On: 03/10/2017 11:33   Dg Chest Port 1 View  Result Date: 03/10/2017 CLINICAL DATA:  CHF EXAM: PORTABLE CHEST 1 VIEW COMPARISON:  03/09/2017 FINDINGS: Increased interstitial markings, favoring mild interstitial edema. No definite pleural effusions. No pneumothorax. Cardiomegaly.  Prosthetic valve.  Left subclavian pacemaker. Median sternotomy. IMPRESSION: Cardiomegaly with suspected mild interstitial edema. No definite pleural effusions. Electronically Signed   By: Charline Bills M.D.   On: 03/10/2017 11:33   Dg Chest Portable 1 View  Result Date: 03/09/2017 CLINICAL DATA:  PORTABLE CHEST 1 VIEW COMPARISON:  None. FINDINGS: Sternotomy wires overlie normal enlarged cardiac silhouette. There is mild airspace disease similar to prior. No focal infiltrate. No pneumothorax. IMPRESSION: Cardiomegaly and mild pulmonary edema.  No interval change. Electronically Signed   By: Genevive Bi M.D.   On: 03/09/2017 12:34      Assessment & Plan: Mr. CALIB WADHWA is a 83 y.o. white male with hypertension, COPD, coronary artery disease status post CABG, congestive heart failure, atrial fibrillation, AICD placement, CVA, parkinson's, hypothyroidism, diabetes mellitus type II, who was admitted to [facility] on 03/09/2017 for COPD exacerbation (HCC) [J44.1] Acute respiratory failure with hypoxia (HCC) [J96.01] Atrial fibrillation with RVR (HCC) [I48.91] Community acquired pneumonia,  unspecified laterality [J18.9]   1. Acute renal failure on chronic kidney disease stage III: baseline creatinine of 1.23, GFR of 49.  Acute renal failure secondary to acute cardiorenal syndrome.  Chronic kidney disease secondary to diabetes and hypertension - Agree with IV furosemide.  - holding lisinopril  2. Hypertension: with acute exacerbation of congestive heart failure systolic - IV furosemide.   3. Acute respiratory failure: unclear how much is due to congestive heart failure.  - Agree with transfer to step down.      LOS: 0 Hearl Heikes 11/6/201812:47 PM

## 2017-03-10 NOTE — Consult Note (Signed)
Cardiology Consultation:   Patient ID: Troy [name]; [id number]; 23-Apr-1924   Admit date: 03/09/2017 Date of Consult: 03/10/2017  Primary Care Provider: Barbette Reichmann, MD Primary Cardiologist: Jettie Pagan) Primary Electrophysiologist:  Graciela Husbands   Patient Profile:   Troy [name] is a 83 y.o. male with a hx of chronic Afib on Eliquis 2.5 mg bid s/p MAZE procedure 1999, tachy-brady syndrome s/p PPM 08/2015, chronic systolic CHF with EF 50-55% by echo 09/2015, pulmonary hypertension, CKD stage III, COPD, DM2, MVP s/p repair 1999, prior stroke, Parkinson's disease, hypothyroidism, and legal blindness who is being seen today for the evaluation of Afib with RVR in the setting of AECOPD at the request of Dr. Allena Katz, MD.  History of Present Illness:   Troy [name] most recent echo from 09/2015 showed EF 50-55%, no RWMA, moderately dilated LA, low normal RV systolic function, PASP 53 mmHg. Prior LHC in 199 showed normal coronary arteries. He underwent MAZE procedure and MVP repair at [facility] in 1999. His Afib has been been managed with Coumadin for anticoagulation, most recently being placed on Eliquis 2.5 mg bid in place of Coumadin and he has not been on any medications for rate control as an outpatient.   Patient presented to [facility] on 11/5 with a 2 day history of increased SOB, wheezing, and cough productive of yellow-green sputum. He was found to have AECOPD and required BiPAP briefly. He was also noted to be in Afib with RVR with heart rates in the 120s bpm. He was initially placed diltiazem gtt though became hypotensive on 5 mg/hr requiring discontinuation. Heart rates have been reasonably well controlled in the low 100s bpm. Labs showed K+ 3.9-->4.0, Na 137-->132, SCr 1.43-->3.77 (baseline 1.2-1.3), WBC 7.5-->13.0, HGB 13.9, PLT 140, influenza negative, PCT < 0.1, magnesium 2.3. He has has minimal UOP with IV Lasix of 150 mL. CXR showed mild pulmonary edema. Never with chest pain.    Past  Medical History:  Diagnosis Date  . Chronic atrial fibrillation (HCC)    a. on Couamdin; b. CHADS2VASc at least 7 (CHF, HTN, age x 2, DM, stroke x 2); c. s/p MAZE 1999  . Chronic systolic CHF (congestive heart failure) (HCC)    a. echo 08/01/2015: EF of 45%, mild LVH, mitral valve with annular calcification, mitral valve partially mobile  . CKD (chronic kidney disease), stage III (HCC)   . COPD (chronic obstructive pulmonary disease) (HCC)   . Diabetes mellitus without complication (HCC)   . Hypertension   . Hypothyroidism   . Legally blind   . Mitral valve prolapse    a. s/p mitral valve repair 1999  . Normal coronary arteries    a. by cardiac cath 1999  . Parkinson's disease (HCC)   . Stroke (HCC)   . TIA (transient ischemic attack)   . UTI (lower urinary tract infection)     Past Surgical History:  Procedure Laterality Date  . CARDIAC SURGERY    . CHOLECYSTECTOMY       Home Meds: Prior to Admission medications   Medication Sig Start Date End Date Taking? Authorizing Provider  albuterol (PROVENTIL HFA;VENTOLIN HFA) 108 (90 Base) MCG/ACT inhaler Inhale 2 puffs every 4 (four) hours as needed into the lungs for wheezing or shortness of breath.   Yes [provider]  apixaban (ELIQUIS) 2.5 MG TABS tablet Take 2.5 mg 2 (two) times daily by mouth.   Yes [provider]  carbidopa-levodopa (SINEMET IR) 25-250 MG tablet Take 1 tablet by  mouth 4 (four) times daily.    Yes [provider]  cetirizine (ZYRTEC) 10 MG tablet Take 10 mg by mouth daily.   Yes [provider]  furosemide (LASIX) 40 MG tablet Take 1 tablet (40 mg total) by mouth daily. 03/21/16  Yes Katharina CaperVaickute, Rima, MD  gabapentin (NEURONTIN) 100 MG capsule Take 100 mg 3 (three) times daily by mouth.   Yes [provider]  glipiZIDE (GLUCOTROL) 10 MG tablet Take 10 mg by mouth daily before breakfast.   Yes [provider]  insulin glargine (LANTUS) 100 UNIT/ML injection  Inject 0.32 mLs (32 Units total) into the skin daily. Patient taking differently: Inject 20 Units into the skin at bedtime.  05/14/16  Yes Katha HammingKonidena, Snehalatha, MD  insulin regular (NOVOLIN R,HUMULIN R) 100 units/mL injection Inject 15-25 Units 2 (two) times daily before a meal into the skin.    Yes [provider]  lisinopril (PRINIVIL,ZESTRIL) 10 MG tablet Take 10 mg by mouth daily.   Yes [provider]  potassium chloride (MICRO-K) 10 MEQ CR capsule Take 10 mEq by mouth daily.   Yes [provider]  predniSONE (STERAPRED UNI-PAK 21 TAB) 10 MG (21) TBPK tablet Take 1 tablet (10 mg total) by mouth daily. Take as prescribed Patient taking differently: Take 5 mg by mouth daily. Take as prescribed 05/13/16  Yes Katha HammingKonidena, Snehalatha, MD  saxagliptin HCl (ONGLYZA) 5 MG TABS tablet Take 5 mg by mouth daily.   Yes [provider]  albuterol (PROVENTIL) (2.5 MG/3ML) 0.083% nebulizer solution Take 3 mLs (2.5 mg total) by nebulization every 6 (six) hours as needed for wheezing or shortness of breath. 05/13/16   Katha HammingKonidena, Snehalatha, MD  fluticasone-salmeterol (ADVAIR HFA) 161-09115-21 MCG/ACT inhaler Inhale 2 puffs into the lungs 2 (two) times daily. Patient not taking: Reported on 03/09/2017 12/11/15   Alford HighlandWieting, Richard, MD  HYDROcodone-acetaminophen (NORCO/VICODIN) 5-325 MG tablet Take 1 tablet by mouth every 6 (six) hours as needed for moderate pain. Patient not taking: Reported on 03/09/2017 01/16/17   Emily FilbertWilliams, Jonathan E, MD  insulin aspart (NOVOLOG) 100 UNIT/ML injection Inject 0-5 Units into the skin at bedtime. Patient not taking: Reported on 03/09/2017 05/13/16   Katha HammingKonidena, Snehalatha, MD  metroNIDAZOLE (METROGEL) 0.75 % gel Apply 1 application topically daily.    [provider]  valACYclovir (VALTREX) 1000 MG tablet Take 1 tablet (1,000 mg total) by mouth 3 (three) times daily. Patient not taking: Reported on 03/09/2017 01/16/17   Emily FilbertWilliams, Jonathan E, MD    Inpatient  Medications: Scheduled Meds: . apixaban  2.5 mg Oral BID  . budesonide (PULMICORT) nebulizer solution  0.25 mg Nebulization BID  . carbidopa-levodopa  1 tablet Oral QID  . furosemide  40 mg Intravenous Once  . gabapentin  100 mg Oral TID  . glipiZIDE  10 mg Oral QAC breakfast  . guaiFENesin  600 mg Oral BID  . insulin aspart  0-15 Units Subcutaneous TID AC & HS  . insulin aspart  6 Units Subcutaneous TID WC  . insulin glargine  32 Units Subcutaneous Daily  . ipratropium-albuterol  3 mL Nebulization Q6H  . linagliptin  5 mg Oral Daily  . loratadine  10 mg Oral Daily  . methylPREDNISolone (SOLU-MEDROL) injection  60 mg Intravenous Q6H  . sodium chloride flush  3 mL Intravenous Q12H  . valACYclovir  1,000 mg Oral TID   Continuous Infusions: . sodium chloride    . azithromycin    . cefTRIAXone (ROCEPHIN)  IV  PRN Meds: sodium chloride, acetaminophen **OR** acetaminophen, albuterol, HYDROcodone-acetaminophen, ondansetron **OR** ondansetron (ZOFRAN) IV, sodium chloride flush  Allergies:  No Known Allergies  Social History:   Social History   Socioeconomic History  . Marital status: Married    Spouse name: Not on file  . Number of children: Not on file  . Years of education: Not on file  . Highest education level: Not on file  Social Needs  . Financial resource strain: Not on file  . Food insecurity - worry: Not on file  . Food insecurity - inability: Not on file  . Transportation needs - medical: Not on file  . Transportation needs - non-medical: Not on file  Occupational History  . Occupation: retired  Tobacco Use  . Smoking status: Former Smoker    Packs/day: 4.00    Years: 20.00    Pack years: 80.00    Types: Cigarettes  . Smokeless tobacco: Never Used  Substance and Sexual Activity  . Alcohol use: No    Alcohol/week: 0.0 oz  . Drug use: No  . Sexual activity: Not on file  Other Topics Concern  . Not on file  Social History Narrative  . Not on file      Family History:  Family History  Problem Relation Age of Onset  . Hypertension Mother   . Stroke Mother   . CAD Father   . Lymphoma Sister   . Lung disease Brother     ROS:  Review of Systems  Constitutional: Positive for malaise/fatigue. Negative for chills, diaphoresis, fever and weight loss.  HENT: Negative for congestion.   Eyes: Negative for discharge and redness.  Respiratory: Positive for cough, shortness of breath and wheezing. Negative for hemoptysis and sputum production.   Cardiovascular: Positive for orthopnea. Negative for chest pain, palpitations, claudication, leg swelling and PND.  Gastrointestinal: Negative for abdominal pain, blood in stool, heartburn, melena, nausea and vomiting.  Genitourinary: Negative for hematuria.  Musculoskeletal: Negative for falls and myalgias.  Skin: Negative for rash.  Neurological: Positive for weakness. Negative for dizziness, tingling, tremors, sensory change, speech change, focal weakness and loss of consciousness.  Endo/Heme/Allergies: Does not bruise/bleed easily.  Psychiatric/Behavioral: Negative for substance abuse. The patient is not nervous/anxious.   All other systems reviewed and are negative.     Physical Exam/Data:   Vitals:   03/10/17 0843 03/10/17 0853 03/10/17 0858 03/10/17 0900  BP:  (!) 107/49 (!) 110/46 (!) 98/47  Pulse:  (!) 105 (!) 106 (!) 110  Resp:      Temp:      TempSrc:      SpO2: 93% 92% 95% 90%  Weight:      Height:        Intake/Output Summary (Last 24 hours) at 03/10/2017 0930 Last data filed at 03/10/2017 0648 Gross per 24 hour  Intake 550 ml  Output 150 ml  Net 400 ml   Filed Weights   03/09/17 1115 03/09/17 1914  Weight: 229 lb (103.9 kg) 239 lb 3.2 oz (108.5 kg)   Body mass index is 39.2 kg/m.   Physical Exam: General: Elderly appearing, in no acute distress. Head: Normocephalic, atraumatic, sclera non-icteric, no xanthomas, nares without discharge. Neck: Negative for carotid  bruits. JVD not elevated. Lungs: Diffuse bilateral expiratory wheezing with diminished breath sounds bilaterally. Breathing is unlabored, on nasal cannula.  Heart: Irregularly irregular with S1 S2. No murmurs, rubs, or gallops appreciated. Abdomen: Soft, non-tender, non-distended with normoactive bowel sounds. No hepatomegaly. No rebound/guarding. No  obvious abdominal masses. Msk:  Strength and tone appear normal for age. Extremities: No clubbing or cyanosis. No edema. Distal pedal pulses are 2+ and equal bilaterally. Neuro: Alert and oriented X 3. No facial asymmetry. No focal deficit. Moves all extremities spontaneously. Psych:  Responds to questions appropriately with a normal affect.   EKG:  The EKG was personally reviewed and demonstrates: Afib with RVR, 125 bpm, occasional PVCs, RBBB Telemetry:  Telemetry was personally reviewed and demonstrates: Afib with heart rates in the 90s to low 100s mostly. Rare episode of Afib with RVR into the 120s bpm. Occasional PVCs.  Weights: Filed Weights   03/09/17 1115 03/09/17 1914  Weight: 229 lb (103.9 kg) 239 lb 3.2 oz (108.5 kg)    Relevant CV Studies: TTE 09/2015: Study Conclusions  - Procedure narrative: Transthoracic echocardiography. The study   was technically difficult. - Left ventricle: The cavity size was normal. Systolic function was   normal. The estimated ejection fraction was in the range of 50%   to 55%. Wall motion was normal; there were no regional wall   motion abnormalities. The study is not technically sufficient to   allow evaluation of LV diastolic function. - Left atrium: The atrium was moderately dilated. - Right ventricle: Pacer wire or catheter noted in right ventricle.   Systolic function was low normal. - Pulmonary arteries: Systolic pressure was moderately elevated. PA   peak pressure: 53 mm Hg (S). - Pericardium, extracardiac: A trivial pericardial effusion was   identified.  Impressions:  - Rhythm is  atrial fibrillation.  Laboratory Data:  Chemistry Recent Labs  Lab 03/09/17 1115 03/10/17 0431  NA 137 132*  K 3.9 4.0  CL 98* 95*  CO2 28 23  GLUCOSE 265* 545*  BUN 22* 42*  CREATININE 1.43* 3.77*  CALCIUM 8.3* 8.2*  GFRNONAA 41* 13*  GFRAA 48* 15*  ANIONGAP 11 14    No results for input(s): PROT, ALBUMIN, AST, ALT, ALKPHOS, BILITOT in the last 168 hours. Hematology Recent Labs  Lab 03/09/17 1115 03/10/17 0431  WBC 7.6 13.0*  RBC 4.50 4.04*  HGB 13.9 12.3*  HCT 41.9 37.9*  MCV 93.3 93.8  MCH 30.8 30.4  MCHC 33.0 32.4  RDW 14.9* 14.8*  PLT 140* 152   Cardiac Enzymes Recent Labs  Lab 03/09/17 1115  TROPONINI 0.03*   No results for input(s): TROPIPOC in the last 168 hours.  BNPNo results for input(s): BNP, PROBNP in the last 168 hours.  DDimer No results for input(s): DDIMER in the last 168 hours.  Radiology/Studies:  Dg Chest Portable 1 View  Result Date: 03/09/2017 IMPRESSION: Cardiomegaly and mild pulmonary edema.  No interval change. Electronically Signed   By: Genevive BiStewart  Edmunds M.D.   On: 03/09/2017 12:34    Assessment and Plan:   1. Acute respiratory failure with hypoxia: -Likely in the setting of AECOPD with possible mild component of CHF exacerbation  -Recommend scaling up of therapy with BiPAP as needed  -Consider ABG -May need transfer to ICU  2. Chronic Afib with RVR: -Heart rate is reasonably well controlled in the low 100s bpm in the setting of his AECOPD and respiratory failure  -Has not tolerated diltiazem gtt 2/2 hypotension -There are not many options for rate control at this time given he is actively wheezing and hypotensive he is not a candidate for beta blocker -Given his hypotension he is not a candidate for calcium channel blocker -His ARF precludes digoxin -If heart rates become tachycardic into the  120s bpm and sustain this rate our only option would be amiodarone gtt for rate control with close monitoring of BP -Continue Eliquis  2.5 mg bid (age and renal functio) -Consider transition back to Coumadin given ARF -CHADS2VASc at least 8 (CHF, HTN, age x 2, DM, stroke x 2, vascular disease) -Recommend breathing treatments with Xopenex over ablbuterol  3. AECOPD: -Appears he would benefit from BiPAP -IM is trying breathing treatment and Lasix this morning -ABX/steroids/nebs per IM  4. ARF: -Possibly in the setting of ATN from hypotension and infection -Avoid nephrotoxic agents -IM has renal on board   5. Acute on chronic combined CHF: -Suspect his symptoms are mostly pulmonary -Lasix per IM/Renal -Check echo -BNP unlikely to be helpful given his ARF -Not on beta blocker given hypotension -Not on ACEi/ARB/spiro given ARF -Restart evidence based medications as able   For questions or updates, please contact [facility] Please consult [email or web address] for contact info under Cardiology/STEMI.   Signed, Eula Listen, PA-C [facility] Pager: [phone] 03/10/2017, 9:30 AM

## 2017-03-10 NOTE — Progress Notes (Signed)
Will recheck SCr this afternoon, if this is consistent with level this AM would recommend IV fluids this afternoon and treatment of his lungs. Would recommend holding Lasix.

## 2017-03-10 NOTE — Progress Notes (Signed)
Patient appears very lethargic this AM.  BP is soft, 02 is running low and needed to be adjusted to [facility].  Patient able to be aroused and is oriented x3.  Rhonchi and wheezinig noted.  Dr. Elpidio AnisSudini and Eula Listenyan Dunn aware.  Contacted Nephrology d/t increased creatinine overnight and minimal urine output.  Patient afib on monitor running in low 100s.  Continue to monitor to see if patient needs more progressive care.   U/S renal, echo, xray ordered.  Breathing treatments, lasix ordered.

## 2017-03-10 NOTE — Consult Note (Signed)
PULMONARY / CRITICAL CARE MEDICINE   Name: Troy [name] MRN: [id number] DOB: 05/09/1923    ADMISSION DATE:  03/09/2017   CONSULTATION DATE:  03/10/2017  REFERRING Troy [name]:  Dr. Elpidio Anis  REASON: Hypotension and worsening kidney function  CHIEF COMPLAINT: Dyspnea  HISTORY OF PRESENT ILLNESS:   83 y/o male with a PMH as indicated below who was admitted 03/09/17 with shortness of breath and weakness, found to be in Afib with RVR, and acute respiratory failure secondary to volume overload.  He was started on a diltiazem infusion, Lasix, antibiotics and admitted to the floor.  He was transferred to the stepdown unit this afternoon for worsening kidney function with anuria, hypotension and acute hypoxic respiratory failure. He is now off the diltiazem infusion with his heart rate sustaining between 100 and 115 bpm, mildly tachypneic and hypoxic with SPO2 in the high 80s on 5 L nasal cannula.  His blood glucose is in the 500s on SQ insulin.  He is on IV steroids.  PAST MEDICAL HISTORY :  He  has a past medical history of Chronic atrial fibrillation (HCC), Chronic systolic CHF (congestive heart failure) (HCC), CKD (chronic kidney disease), stage III (HCC), COPD (chronic obstructive pulmonary disease) (HCC), Diabetes mellitus without complication (HCC), Hypertension, Hypothyroidism, Legally blind, Mitral valve prolapse, Normal coronary arteries, Parkinson's disease (HCC), Stroke (HCC), TIA (transient ischemic attack), and UTI (lower urinary tract infection).  PAST SURGICAL HISTORY: He  has a past surgical history that includes Cardiac surgery; Cholecystectomy; Pacemaker Implant (N/A, 08/08/2015); and Temporary Pacemaker (N/A, 08/08/2015).  No Known Allergies  No current facility-administered medications on file prior to encounter.    Current Outpatient Medications on File Prior to Encounter  Medication Sig  . albuterol (PROVENTIL HFA;VENTOLIN HFA) 108 (90 Base) MCG/ACT inhaler Inhale 2 puffs every 4  (four) hours as needed into the lungs for wheezing or shortness of breath.  [name] apixaban (ELIQUIS) 2.5 MG TABS tablet Take 2.5 mg 2 (two) times daily by mouth.  . carbidopa-levodopa (SINEMET IR) 25-250 MG tablet Take 1 tablet by mouth 4 (four) times daily.   . cetirizine (ZYRTEC) 10 MG tablet Take 10 mg by mouth daily.  . furosemide (LASIX) 40 MG tablet Take 1 tablet (40 mg total) by mouth daily.  [name] gabapentin (NEURONTIN) 100 MG capsule Take 100 mg 3 (three) times daily by mouth.  [name] glipiZIDE (GLUCOTROL) 10 MG tablet Take 10 mg by mouth daily before breakfast.  . insulin glargine (LANTUS) 100 UNIT/ML injection Inject 0.32 mLs (32 Units total) into the skin daily. (Patient taking differently: Inject 20 Units into the skin at bedtime. )  . insulin regular (NOVOLIN R,HUMULIN R) 100 units/mL injection Inject 15-25 Units 2 (two) times daily before a meal into the skin.   [name] lisinopril (PRINIVIL,ZESTRIL) 10 MG tablet Take 10 mg by mouth daily.  . potassium chloride (MICRO-K) 10 MEQ CR capsule Take 10 mEq by mouth daily.  . predniSONE (STERAPRED UNI-PAK 21 TAB) 10 MG (21) TBPK tablet Take 1 tablet (10 mg total) by mouth daily. Take as prescribed (Patient taking differently: Take 5 mg by mouth daily. Take as prescribed)  . saxagliptin HCl (ONGLYZA) 5 MG TABS tablet Take 5 mg by mouth daily.  [name] albuterol (PROVENTIL) (2.5 MG/3ML) 0.083% nebulizer solution Take 3 mLs (2.5 mg total) by nebulization every 6 (six) hours as needed for wheezing or shortness of breath.  . fluticasone-salmeterol (ADVAIR HFA) 115-21 MCG/ACT inhaler Inhale 2 puffs into the lungs 2 (two) times daily. (Patient not  taking: Reported on 03/09/2017)  . HYDROcodone-acetaminophen (NORCO/VICODIN) 5-325 MG tablet Take 1 tablet by mouth every 6 (six) hours as needed for moderate pain. (Patient not taking: Reported on 03/09/2017)  . insulin aspart (NOVOLOG) 100 UNIT/ML injection Inject 0-5 Units into the skin at bedtime. (Patient not taking: Reported on  03/09/2017)  . metroNIDAZOLE (METROGEL) 0.75 % gel Apply 1 application topically daily.  . valACYclovir (VALTREX) 1000 MG tablet Take 1 tablet (1,000 mg total) by mouth 3 (three) times daily. (Patient not taking: Reported on 03/09/2017)    FAMILY HISTORY:  His indicated that his mother is deceased. He indicated that his father is deceased. He indicated that the status of his sister is unknown. He indicated that the status of his brother is unknown.   SOCIAL HISTORY: He  reports that he has quit smoking. His smoking use included cigarettes. He has a 80.00 pack-year smoking history. he has never used smokeless tobacco. He reports that he does not drink alcohol or use drugs.  REVIEW OF SYSTEMS:   Constitutional: Negative for fever and chills.  HENT: Negative for congestion and rhinorrhea.  Eyes: Negative for redness and visual disturbance.  Respiratory: Improved shortness of breath but persistent wheezing.  Cardiovascular: Negative for chest pain and palpitations.  Gastrointestinal: Negative  for nausea , vomiting and abdominal pain and  Loose stools Genitourinary: Negative for dysuria and urgency.  Endocrine: Denies polyuria, polyphagia and heat intolerance Musculoskeletal: Positive for generalized weakness  Skin: Negative for pallor and wound.  Neurological: Negative for dizziness and headaches   SUBJECTIVE:   VITAL SIGNS: BP (!) 112/45   Pulse (!) 110   Temp 98.3 F (36.8 C) (Oral)   Resp (!) 21   Ht 5' 9.5" (1.765 m)   Wt 242 lb 11.6 oz (110.1 kg)   SpO2 (!) 86%   BMI 35.33 kg/m   HEMODYNAMICS:    VENTILATOR SETTINGS: FiO2 (%):  [32 %] 32 %  INTAKE / OUTPUT: I/O last 3 completed shifts: In: 550 [IV Piggyback:550] Out: 150 [Urine:150]  PHYSICAL EXAMINATION: General: Awake, no acute distres Neuro: alert and oriented to person, place and time, moves all extremities, cranial nerves intact  HEENT: PERRLA, neck is short, supple, no JVD Cardiovascular: Apical pulse  irregular, mildly tachycardic at 114 bpm, S1-S2, no murmur regurg or gallop, left chest wall with palpable pacemaker, +1 edema bilaterally, +2 pulses bilaterally Lungs: Mild tachypnea, SPO2 80-92%, bilateral breath sounds with coarse expiratory wheezes and mild bibasilar crackles Abdomen: Obese, normal bowel sounds, palpation reveals no organomegaly Musculoskeletal: No deformities, no joint swelling Skin: Warm and dry LABS:  BMET Recent Labs  Lab 03/09/17 1115 03/10/17 0431 03/10/17 1354  NA 137 132*  --   K 3.9 4.0  --   CL 98* 95*  --   CO2 28 23  --   BUN 22* 42*  --   CREATININE 1.43* 3.77* 5.06*  GLUCOSE 265* 545*  --     Electrolytes Recent Labs  Lab 03/09/17 1115 03/10/17 0431  CALCIUM 8.3* 8.2*  MG  --  2.3    CBC Recent Labs  Lab 03/09/17 1115 03/10/17 0431  WBC 7.6 13.0*  HGB 13.9 12.3*  HCT 41.9 37.9*  PLT 140* 152    Coag's No results for input(s): APTT, INR in the last 168 hours.  Sepsis Markers Recent Labs  Lab 03/09/17 1115  PROCALCITON <0.10    ABG No results for input(s): PHART, PCO2ART, PO2ART in the last 168 hours.  Liver Enzymes  No results for input(s): AST, ALT, ALKPHOS, BILITOT, ALBUMIN in the last 168 hours.  Cardiac Enzymes Recent Labs  Lab 03/09/17 1115  TROPONINI 0.03*    Glucose Recent Labs  Lab 03/09/17 1608 03/09/17 2046 03/10/17 0545 03/10/17 0849 03/10/17 1139 03/10/17 1351  GLUCAP 441* 522* 496* 549* 485* 522*    Imaging US Renal  Result Date: 03/10/2017 CLINICAL DATA:  83 year old male with acute renal failure. Initial encounter. EXAM: RENAL / URINARY TRACT ULTRASOUND COMPLETE COMPARISON:  01/16/2017 CT. FINDINGS: Right Kidney: Length: 15.6 cm. No hydronephrosis. Multiple cysts measuring up to 6.1 cm. Left Kidney: Length: 13.2 cm. No hydronephrosis. Multiple cysts measuring up to 9.2 cm. Bladder: Not visualized secondary to under distension and bowel gas. IMPRESSION: No hydronephrosis.  Large bilateral  renal cysts. Electronically Signed   By: Lacy Duverney M.D.   On: 03/10/2017 11:33   Dg Chest Port 1 View  Result Date: 03/10/2017 CLINICAL DATA:  CHF EXAM: PORTABLE CHEST 1 VIEW COMPARISON:  03/09/2017 FINDINGS: Increased interstitial markings, favoring mild interstitial edema. No definite pleural effusions. No pneumothorax. Cardiomegaly.  Prosthetic valve.  Left subclavian pacemaker. Median sternotomy. IMPRESSION: Cardiomegaly with suspected mild interstitial edema. No definite pleural effusions. Electronically Signed   By: Charline Bills M.D.   On: 03/10/2017 11:33   STUDIES:  2D echo done, report pending  CULTURES: None  ANTIBIOTICS: Antibiotics discontinued  SIGNIFICANT EVENTS: 03/10/2017: Transferred to the stepdown unit for hypotension and worsening   LINES/TUBES: Peripheral IVs  DISCUSSION: 83 year old male presenting with acute hypoxic respiratory failure, pulmonary edema, acute CHF exacerbation, acute COPD exacerbation  hypertension, and acute renal failure with anuria secondary to poor perfusion  ASSESSMENT  Acute hypoxic respiratory failure Acute pulmonary edema Acute CHF exacerbation Acute COPD exacerbation-possibly chronic asthmatic bronchitis-patient quit smoking 40 years ago but he was a heavy smoker A. fib with RVR-heart rate improved with diltiazem;  Currently anticoagulated Hypotension Type 2 diabetes-currently severely hypoglycemic secondary to steroids  PLAN Hemodynamics per ICU protocol Continuous BiPAP to maintain SPO2 greater than 88%; transition to nasal cannula as tolerated Phenylephrine to maintain mean arterial blood pressure greater than 65 IV diuresis if mean arterial blood pressure greater than 70 Continue full dose anticoagulation If heart rate sustains above 125 bpm will initiate amiodarone bolus with infusion Nephrology and cardiology following Trend creatinine Monitor and correct electrolyte imbalances Follow-up 2D echo results DC IV  steroids Xopenex and ipratropium every 6 hours IV insulin per protocol GI and DVT prophylaxis Keep n.p.o. and on continuous BiPAP Mouth care per protocol  FAMILY  - Updates: Patient's family updated at bedside by Troy [name] and myself.  We talked at length about patient's possible prognosis and current health status.  Patient was very adamant that he does not want hemodialysis should his creatinine failed to improve with conservative measures.  He is currently a DNR/DNI.  Patient's son and daughter updated again at bedside by myself and the palliative care nurse practitioner. - Inter-disciplinary family meet or Palliative Care meeting due by:  day 7  Total CC time 60 minutes  Magdalene S. [facility] ANP-BC Pulmonary and Critical Care Medicine [facility] Pager [phone] or [phone]  03/10/2017, 2:26 PM   PCCM ATTENDING ATTESTATION:  I have evaluated patient with the Troy [name], reviewed database in its entirety and discussed care plan in detail.   Important exam findings: Respirations are minimally labored at rest Requiring 5-6 LPM via [place] to maintain SPO2 greater than 90% Cognition intact JVP cannot be visualized Bibasilar crackles, scattered  wheezes Obese, soft, NT, + BS Extremities cool, diminished pulses  BMP Latest Ref Rng & Units 03/10/2017 03/10/2017 03/09/2017  Glucose 65 - 99 mg/dL - 621(HY545(HH) 865(H265(H)  BUN 6 - 20 mg/dL - 84(O42(H) 96(E22(H)  Creatinine 0.61 - 1.24 mg/dL 9.52(W5.06(H) 4.13(K3.77(H) 4.40(N1.43(H)  Sodium 135 - 145 mmol/L - 132(L) 137  Potassium 3.5 - 5.1 mmol/L - 4.0 3.9  Chloride 101 - 111 mmol/L - 95(L) 98(L)  CO2 22 - 32 mmol/L - 23 28  Calcium 8.9 - 10.3 mg/dL - 8.2(L) 8.3(L)    CXR: Cardiomegaly, interstitial edema  Major problems addressed by PCCM team: History of COPD History of CHF Chronic atrial fibrillation Type 2 diabetes Advanced age  Now with  Acute on chronic respiratory failure with hypoxemia Pulmonary edema Hypotension AF RVR AKI -likely due to  poor renal perfusion Severe hyperglycemia due to systemic steroids DNR   I discussed options of therapy with him in detail.  We discussed the possibility of short-term dialysis.  He wishes to forego this for now.  Certainly, he would not be a candidate for long-term dialysis.   PLAN/REC: Phenylephrine to maintain MAP >70 mmHg Consider amiodarone if heart rate consistently above 115 or 120/minute Continue nebulized bronchodilators Add nebulized steroids Discontinue systemic steroids due to severe hyperglycemia Initiate insulin infusion Monitor BMET intermittently Monitor I/Os Correct electrolytes as indicated   Both he and his wife emphasize that their highest priority is that he not suffer  Troy Fischeravid Simonds, Troy [name] PCCM service Mobile [phone] Pager [phone] 03/10/2017 4:51 PM

## 2017-03-10 NOTE — Progress Notes (Signed)
*  PRELIMINARY RESULTS* Echocardiogram 2D Echocardiogram has been performed.  Cristela BlueHege, Miracle Mongillo 03/10/2017, 3:44 PM

## 2017-03-10 NOTE — Progress Notes (Signed)
[employer] - [place] [facility]   PATIENT NAME: Troy CamelRobert [name]    MR#:  [id number]  DATE OF BIRTH:  [birth year]  SUBJECTIVE:  CHIEF COMPLAINT:   Chief Complaint  Patient presents with  . Shortness of Breath  . Weakness   Shortness of breath present.  Dry cough.  Has been hypotensive.  On 4 L oxygen.  Wears 2 L at home.  REVIEW OF SYSTEMS:    Review of Systems  Constitutional: Positive for malaise/fatigue. Negative for chills and fever.  HENT: Negative for sore throat.   Eyes: Negative for blurred vision, double vision and pain.  Respiratory: Positive for cough, shortness of breath and wheezing. Negative for hemoptysis.   Cardiovascular: Negative for chest pain, palpitations, orthopnea and leg swelling.  Gastrointestinal: Negative for abdominal pain, constipation, diarrhea, heartburn, nausea and vomiting.  Genitourinary: Negative for dysuria and hematuria.  Musculoskeletal: Negative for back pain and joint pain.  Skin: Negative for rash.  Neurological: Positive for weakness. Negative for sensory change, speech change, focal weakness and headaches.  Endo/Heme/Allergies: Does not bruise/bleed easily.  Psychiatric/Behavioral: Negative for depression. The patient is not nervous/anxious.     DRUG ALLERGIES:  No Known Allergies  VITALS:  Blood pressure (!) 106/48, pulse (!) 102, temperature 98.5 F (36.9 C), temperature source Axillary, resp. rate 20, height 5' 5.5" (1.664 m), weight 108.5 kg (239 lb 3.2 oz), SpO2 90 %.  PHYSICAL EXAMINATION:   Physical Exam  GENERAL:  83 y.o.-year-old patient lying in the bed .  Obese.  In respiratory distress. EYES: . No scleral icterus. Extraocular muscles intact.  HEENT: Head atraumatic, normocephalic. Oropharynx and nasopharynx clear.  NECK:  Supple, no jugular venous distention. No thyroid enlargement, no tenderness.  LUNGS:  bilateral wheezing and crackles.  Decreased air entry. CARDIOVASCULAR: S1, S2  tachycardiac ABDOMEN: Soft, nontender, nondistended. Bowel sounds present. No organomegaly or mass.  EXTREMITIES: No cyanosis, clubbing or edema b/l.    NEUROLOGIC: Cranial nerves II through XII are intact. No focal Motor or sensory deficits b/l.   PSYCHIATRIC: The patient is alert and awake SKIN: No obvious rash, lesion, or ulcer.   LABORATORY PANEL:   CBC Recent Labs  Lab 03/10/17 0431  WBC 13.0*  HGB 12.3*  HCT 37.9*  PLT 152   ------------------------------------------------------------------------------------------------------------------ Chemistries  Recent Labs  Lab 03/10/17 0431  NA 132*  K 4.0  CL 95*  CO2 23  GLUCOSE 545*  BUN 42*  CREATININE 3.77*  CALCIUM 8.2*  MG 2.3   ------------------------------------------------------------------------------------------------------------------  Cardiac Enzymes Recent Labs  Lab 03/09/17 1115  TROPONINI 0.03*   ------------------------------------------------------------------------------------------------------------------  RADIOLOGY:  Dg Chest Portable 1 View  Result Date: 03/09/2017 CLINICAL DATA:  PORTABLE CHEST 1 VIEW COMPARISON:  None. FINDINGS: Sternotomy wires overlie normal enlarged cardiac silhouette. There is mild airspace disease similar to prior. No focal infiltrate. No pneumothorax. IMPRESSION: Cardiomegaly and mild pulmonary edema.  No interval change. Electronically Signed   By: Genevive BiStewart  Edmunds M.D.   On: 03/09/2017 12:34     ASSESSMENT AND PLAN:   *Acute on chronic systolic congestive heart failure with acute on chronic respiratory failure Patient has poor urine output.  No worsening creatinine.  Unfortunately has significant volume overload.  Will give 1 dose of IV Lasix 40 mg stat.  Monitor input and output.  Repeat labs in check BUN and creatinine. Also hypotension is limiting diuresis.  Stop lisinopril.  Cardiology consulted.  *Acute COPD exacerbation per -IV steroids, Antibiotics -  Scheduled Nebulizers - Inhalers -  Wean O2 as tolerated - Consult pulmonary if no improvement  *Acute kidney injury over CKD stage III due to cardiorenal syndrome.  In spite of worsening creatinine patient will need diuresis with Lasix.  Will consult nephrology.  Monitor input and output.  *Atrial fibrillation with rapid ventricular rate.  Off Cardizem drip.  Heart rate around 100-110.  Continue Eliquis.  Cardiology consulted.  *Insulin-dependent diabetes mellitus with severe hyperglycemia.  Will start pre-meal NovoLog.  Sliding scale insulin in place.  Also on Levemir at night.  * DVT prophylaxis.  On Eliquis.  If patient does not improve will transfer to ICU.  Discussed with Dr. Belia HemanKasa.  Also discussed with cardiology.   All the records are reviewed and case discussed with Care Management/Social Workerr. Management plans discussed with the patient, family and they are in agreement.  CODE STATUS: DNR  DVT Prophylaxis: SCDs  TOTAL CRITICAL CARE TIME TAKING CARE OF THIS PATIENT: 35 minutes.   Milagros LollSudini, Myran Arcia R M.D on 03/10/2017 at 9:45 AM  Between 7am to 6pm - Pager - [phone]  After 6pm go to [email or web address] - password [email or web address] [facility]  [facility]  Office  [phone]  CC: Primary care physician; Barbette ReichmannHande, Vishwanath, MD  Note: This dictation was prepared with Dragon dictation along with smaller phrase technology. Any transcriptional errors that result from this process are unintentional.

## 2017-03-10 NOTE — Consult Note (Signed)
Consultation Note Date: 03/10/2017   Patient Name: Troy [name]  DOB: 03/14/1924  MRN: [id number]  Age / Sex: 83 y.o., male  PCP: Barbette ReichmannHande, Vishwanath, MD Referring Physician: Milagros LollSudini, Srikar, MD  Reason for Consultation: Establishing goals of care  HPI/Patient Profile: Troy [name]  is a 83 y.o. male with a known history of chronic atrial fibrillation, COPD, chronic systolic CHF,, diabetes, essential hypertension, hypothyroidism, chronic respiratory failure, Parkinson's disease who is presenting with shortness of breath and respiratory difficulties.  Patient stated that his symptoms started 2 days ago and has progressively gotten worse.  He has been having significant wheezing and productive cough of yellow-green sputum. Patient came to the ER with the symptoms he is noted to be tachycardic with atrial fibrillation with rapid ventricular rate.  Also tachypneic.    Troy [name].   Clinical Assessment and Goals of Care: [name] was just moved to ICU level care. He is on BiPAP with a cardiac echo currently being performed. 2 children present at this time meeting with critical care team. Patient is currently DNR status. He does not want dialysis short term or long term. Will continue BiPAP.  Introduced self and palliative medicine, will reevaluate in the morning.   NEXT OF KIN is decision [name].     SUMMARY OF RECOMMENDATIONS   Continue current care.   Code Status/Advance Care Planning:  DNR   Additional Recommendations (Limitations, Scope, Preferences):  No dialysis.    Prognosis:   Unable to determine  Discharge Planning: To Be Determined      Primary Diagnoses: Present on Admission: . Acute respiratory failure (HCC) . Acute kidney injury (HCC)   I have reviewed the medical record, interviewed the patient and family, and examined the patient. The following aspects are pertinent.  Past  Medical History:  Diagnosis Date  . Chronic atrial fibrillation (HCC)    a. on Couamdin; b. CHADS2VASc at least 7 (CHF, HTN, age x 2, DM, stroke x 2); c. s/p MAZE 1999  . Chronic systolic CHF (congestive heart failure) (HCC)    a. echo 08/01/2015: EF of 45%, mild LVH, mitral valve with annular calcification, mitral valve partially mobile  . CKD (chronic kidney disease), stage III (HCC)   . COPD (chronic obstructive pulmonary disease) (HCC)   . Diabetes mellitus without complication (HCC)   . Hypertension   . Hypothyroidism   . Legally blind   . Mitral valve prolapse    a. s/p mitral valve repair 1999  . Normal coronary arteries    a. by cardiac cath 1999  . Parkinson's disease (HCC)   . Stroke (HCC)   . TIA (transient ischemic attack)   . UTI (lower urinary tract infection)    Social History   Socioeconomic History  . Marital status: Married    Spouse name: None  . Number of children: None  . Years of education: None  . Highest education level: None  Social Needs  . Financial resource strain: None  . Food insecurity - worry: None  . Food  insecurity - inability: None  . Transportation needs - medical: None  . Transportation needs - non-medical: None  Occupational History  . Occupation: retired  Tobacco Use  . Smoking status: Former Smoker    Packs/day: 4.00    Years: 20.00    Pack years: 80.00    Types: Cigarettes  . Smokeless tobacco: Never Used  Substance and Sexual Activity  . Alcohol use: No    Alcohol/week: 0.0 oz  . Drug use: No  . Sexual activity: None  Other Topics Concern  . None  Social History Narrative  . None   Family History  Problem Relation Age of Onset  . Hypertension Mother   . Stroke Mother   . CAD Father   . Lymphoma Sister   . Lung disease Brother    Scheduled Meds: . budesonide (PULMICORT) nebulizer solution  0.25 mg Nebulization BID  . carbidopa-levodopa  1 tablet Oral QID  . gabapentin  100 mg Oral TID  . glipiZIDE  10 mg Oral  QAC breakfast  . guaiFENesin  600 mg Oral BID  . insulin regular  0-10 Units Intravenous TID WC  . ipratropium  0.5 mg Nebulization Q6H  . levalbuterol  0.63 mg Nebulization Q6H  . linagliptin  5 mg Oral Daily  . loratadine  10 mg Oral Daily  . sodium chloride flush  3 mL Intravenous Q12H  . valACYclovir  1,000 mg Oral TID   Continuous Infusions: . sodium chloride    . amiodarone    . insulin (NOVOLIN-R) infusion 4.6 Units/hr (03/10/17 1458)  . phenylephrine (NEO-SYNEPHRINE) Adult infusion 20 mcg/min (03/10/17 1459)   PRN Meds:.sodium chloride, acetaminophen **OR** acetaminophen, albuterol, amiodarone, dextrose, HYDROcodone-acetaminophen, ondansetron **OR** ondansetron (ZOFRAN) IV, sodium chloride flush Medications Prior to Admission:  Prior to Admission medications   Medication Sig Start Date End Date Taking? Authorizing Provider  albuterol (PROVENTIL HFA;VENTOLIN HFA) 108 (90 Base) MCG/ACT inhaler Inhale 2 puffs every 4 (four) hours as needed into the lungs for wheezing or shortness of breath.   Yes [provider]  apixaban (ELIQUIS) 2.5 MG TABS tablet Take 2.5 mg 2 (two) times daily by mouth.   Yes [provider]  carbidopa-levodopa (SINEMET IR) 25-250 MG tablet Take 1 tablet by mouth 4 (four) times daily.    Yes [provider]  cetirizine (ZYRTEC) 10 MG tablet Take 10 mg by mouth daily.   Yes [provider]  furosemide (LASIX) 40 MG tablet Take 1 tablet (40 mg total) by mouth daily. 03/21/16  Yes Katharina CaperVaickute, Rima, MD  gabapentin (NEURONTIN) 100 MG capsule Take 100 mg 3 (three) times daily by mouth.   Yes [provider]  glipiZIDE (GLUCOTROL) 10 MG tablet Take 10 mg by mouth daily before breakfast.   Yes [provider]  insulin glargine (LANTUS) 100 UNIT/ML injection Inject 0.32 mLs (32 Units total) into the skin daily. Patient taking differently: Inject 20 Units into the skin at bedtime.  05/14/16  Yes Katha HammingKonidena, Snehalatha, MD    insulin regular (NOVOLIN R,HUMULIN R) 100 units/mL injection Inject 15-25 Units 2 (two) times daily before a meal into the skin.    Yes [provider]  lisinopril (PRINIVIL,ZESTRIL) 10 MG tablet Take 10 mg by mouth daily.   Yes [provider]  potassium chloride (MICRO-K) 10 MEQ CR capsule Take 10 mEq by mouth daily.   Yes [provider]  predniSONE (STERAPRED UNI-PAK 21 TAB) 10 MG (21) TBPK tablet Take 1 tablet (10 mg total) by mouth  daily. Take as prescribed Patient taking differently: Take 5 mg by mouth daily. Take as prescribed 05/13/16  Yes Katha Hamming, MD  saxagliptin HCl (ONGLYZA) 5 MG TABS tablet Take 5 mg by mouth daily.   Yes [provider]  albuterol (PROVENTIL) (2.5 MG/3ML) 0.083% nebulizer solution Take 3 mLs (2.5 mg total) by nebulization every 6 (six) hours as needed for wheezing or shortness of breath. 05/13/16   Katha Hamming, MD  fluticasone-salmeterol (ADVAIR HFA) 161-09 MCG/ACT inhaler Inhale 2 puffs into the lungs 2 (two) times daily. Patient not taking: Reported on 03/09/2017 12/11/15   Alford Highland, MD  HYDROcodone-acetaminophen (NORCO/VICODIN) 5-325 MG tablet Take 1 tablet by mouth every 6 (six) hours as needed for moderate pain. Patient not taking: Reported on 03/09/2017 01/16/17   Emily Filbert, MD  insulin aspart (NOVOLOG) 100 UNIT/ML injection Inject 0-5 Units into the skin at bedtime. Patient not taking: Reported on 03/09/2017 05/13/16   Katha Hamming, MD  metroNIDAZOLE (METROGEL) 0.75 % gel Apply 1 application topically daily.    [provider]  valACYclovir (VALTREX) 1000 MG tablet Take 1 tablet (1,000 mg total) by mouth 3 (three) times daily. Patient not taking: Reported on 03/09/2017 01/16/17   Emily Filbert, MD   No Known Allergies Review of Systems  Unable to perform ROS   Physical Exam  Constitutional: He appears ill.  HENT:  Head: Normocephalic.  Pulmonary/Chest:  On BiPAP     Vital Signs: BP (!) 112/45   Pulse (!) 110   Temp 98.3 F (36.8 C) (Oral)   Resp (!) 21   Ht 5' 9.5" (1.765 m)   Wt 110.1 kg (242 lb 11.6 oz)   SpO2 (!) 86%   BMI 35.33 kg/m  Pain Assessment: 0-10   Pain Score: 0-No pain   SpO2: SpO2: (!) 86 % O2 Device:SpO2: (!) 86 % O2 Flow Rate: .O2 Flow Rate (L/min): 5 L/min  IO: Intake/output summary:   Intake/Output Summary (Last 24 hours) at 03/10/2017 1506 Last data filed at 03/10/2017 1039 Gross per 24 hour  Intake 360 ml  Output 150 ml  Net 210 ml    LBM: Last BM Date: (unknown) Baseline Weight: Weight: 103.9 kg (229 lb) Most recent weight: Weight: 110.1 kg (242 lb 11.6 oz)     Palliative Assessment/Data: 30%     Time In: 2:45 Time Out: 3:15 Time Total: 30 minutes Greater than 50%  of this time was spent counseling and coordinating care related to the above assessment and plan.  Signed by: Morton Stall, NP 03/10/2017 3:18 PM Office: [phone] 7am-7pm  Pager: [phone] Call primary team after hours  Please contact Palliative Medicine Team phone at [phone] for questions and concerns.  For individual provider: See Loretha Stapler

## 2017-03-10 NOTE — Progress Notes (Signed)
MD Anne HahnWillis notified that pt's BP is 82/37 and this nurse also expressed concerns about pt's shallow breathing. MD place orders to dc cardizem gtt and Bipap at HS.

## 2017-03-10 NOTE — Progress Notes (Signed)
Continues to worsen and saturations are high 80s on 5 L oxygen.  Hypotensive.  Chest x-ray continues to show pulmonary edema.  Discussed with Dr. Belia HemanKasa.  Will transfer to ICU.

## 2017-03-10 NOTE — Care Management (Signed)
Admitted with exac of copd and CHF.  His creatinine has risen since admitted and to be transferred to icu for possible need for continuous bipap support.  Chronic home oxygen with [facility].  Lives with his wife

## 2017-03-10 NOTE — Progress Notes (Signed)
Inpatient Diabetes Program Recommendations  AACE/ADA: New Consensus Statement on Inpatient Glycemic Control (2015)  Target Ranges:  Prepandial:   less than 140 mg/dL      Peak postprandial:   less than 180 mg/dL (1-2 hours)      Critically ill patients:  140 - 180 mg/dL   Lab Results  Component Value Date   GLUCAP 549 (HH) 03/10/2017   HGBA1C 9.1 (H) 03/20/2016    Review of Glycemic Control  Diabetes history:  DM 2 Outpatient Diabetes medications: Lantus 20 units, Novolog Regular 30 units breakfast, 10 units lunch, 25 units supper, Glipizide 10 mg daily, Onglyza 5 mg Daily Current orders for Inpatient glycemic control: Lantus 32 units, Novolog Moderate 0-15 units tid + Novolog HS scale 0-5 units + Novolog 6 units tid meal coverage, Glipizide 10 mg Daily, Tradjenta 5 mg Daily   Inpatient Diabetes Program Recommendations:    Patient receiving IV Solumedrol 60 mg Q6 hours. Patient is very resistant this am with glucose levels in the 500's this am. Lantus increased and meal coverage added this am. If glucose trends continue to be elevated at lunchtime, may need to consider IV insulin.  Thanks,  Christena DeemShannon Cameron Schwinn RN, MSN, [facility] Inpatient Diabetes Coordinator Team Pager [phone] (8a-5p)

## 2017-03-11 LAB — CBC
HCT: 40.3 % (ref 40.0–52.0)
Hemoglobin: 13 g/dL (ref 13.0–18.0)
MCH: 29.8 pg (ref 26.0–34.0)
MCHC: 32.2 g/dL (ref 32.0–36.0)
MCV: 92.3 fL (ref 80.0–100.0)
Platelets: 183 10*3/uL (ref 150–440)
RBC: 4.37 MIL/uL — ABNORMAL LOW (ref 4.40–5.90)
RDW: 14.7 % — AB (ref 11.5–14.5)
WBC: 24.1 10*3/uL — AB (ref 3.8–10.6)

## 2017-03-11 LAB — GLUCOSE, CAPILLARY
GLUCOSE-CAPILLARY: 319 mg/dL — AB (ref 65–99)
GLUCOSE-CAPILLARY: 68 mg/dL (ref 65–99)
GLUCOSE-CAPILLARY: 73 mg/dL (ref 65–99)
Glucose-Capillary: 127 mg/dL — ABNORMAL HIGH (ref 65–99)
Glucose-Capillary: 134 mg/dL — ABNORMAL HIGH (ref 65–99)
Glucose-Capillary: 151 mg/dL — ABNORMAL HIGH (ref 65–99)
Glucose-Capillary: 158 mg/dL — ABNORMAL HIGH (ref 65–99)
Glucose-Capillary: 160 mg/dL — ABNORMAL HIGH (ref 65–99)
Glucose-Capillary: 172 mg/dL — ABNORMAL HIGH (ref 65–99)
Glucose-Capillary: 179 mg/dL — ABNORMAL HIGH (ref 65–99)
Glucose-Capillary: 229 mg/dL — ABNORMAL HIGH (ref 65–99)
Glucose-Capillary: 242 mg/dL — ABNORMAL HIGH (ref 65–99)
Glucose-Capillary: 291 mg/dL — ABNORMAL HIGH (ref 65–99)
Glucose-Capillary: 70 mg/dL (ref 65–99)
Glucose-Capillary: 99 mg/dL (ref 65–99)

## 2017-03-11 LAB — BASIC METABOLIC PANEL
ANION GAP: 14 (ref 5–15)
BUN: 42 mg/dL — ABNORMAL HIGH (ref 6–20)
CALCIUM: 8.2 mg/dL — AB (ref 8.9–10.3)
CO2: 23 mmol/L (ref 22–32)
Chloride: 95 mmol/L — ABNORMAL LOW (ref 101–111)
Creatinine, Ser: 3.77 mg/dL — ABNORMAL HIGH (ref 0.61–1.24)
GFR, EST AFRICAN AMERICAN: 15 mL/min — AB (ref >60)
GFR, EST NON AFRICAN AMERICAN: 13 mL/min — AB (ref >60)
GLUCOSE: 545 mg/dL — AB (ref 65–99)
Potassium: 4 mmol/L (ref 3.5–5.1)
SODIUM: 132 mmol/L — AB (ref 135–145)

## 2017-03-11 LAB — COMPREHENSIVE METABOLIC PANEL
ALBUMIN: 3.4 g/dL — AB (ref 3.5–5.0)
ALT: 8 U/L — ABNORMAL LOW (ref 17–63)
AST: 35 U/L (ref 15–41)
Alkaline Phosphatase: 78 U/L (ref 38–126)
Anion gap: 15 (ref 5–15)
BUN: 81 mg/dL — AB (ref 6–20)
CHLORIDE: 94 mmol/L — AB (ref 101–111)
CO2: 23 mmol/L (ref 22–32)
Calcium: 8.2 mg/dL — ABNORMAL LOW (ref 8.9–10.3)
Creatinine, Ser: 5.23 mg/dL — ABNORMAL HIGH (ref 0.61–1.24)
GFR calc Af Amer: 10 mL/min — ABNORMAL LOW (ref >60)
GFR calc non Af Amer: 9 mL/min — ABNORMAL LOW (ref >60)
GLUCOSE: 285 mg/dL — AB (ref 65–99)
POTASSIUM: 4.2 mmol/L (ref 3.5–5.1)
SODIUM: 132 mmol/L — AB (ref 135–145)
Total Bilirubin: 0.9 mg/dL (ref 0.3–1.2)
Total Protein: 7.2 g/dL (ref 6.5–8.1)

## 2017-03-11 MED ORDER — METHYLPREDNISOLONE SODIUM SUCC 40 MG IJ SOLR
20.0000 mg | Freq: Two times a day (BID) | INTRAMUSCULAR | Status: DC
  Administered 2017-03-11 – 2017-03-12 (×3): 20 mg via INTRAVENOUS
  Filled 2017-03-11 (×3): qty 1

## 2017-03-11 MED ORDER — AMIODARONE HCL IN DEXTROSE 360-4.14 MG/200ML-% IV SOLN
30.0000 mg/h | INTRAVENOUS | Status: DC
  Administered 2017-03-11 – 2017-03-12 (×2): 30 mg/h via INTRAVENOUS
  Filled 2017-03-11: qty 200

## 2017-03-11 MED ORDER — INSULIN ASPART 100 UNIT/ML ~~LOC~~ SOLN
0.0000 [IU] | Freq: Three times a day (TID) | SUBCUTANEOUS | Status: DC
  Administered 2017-03-11 (×2): 3 [IU] via SUBCUTANEOUS
  Administered 2017-03-12: 11 [IU] via SUBCUTANEOUS
  Administered 2017-03-12 (×2): 15 [IU] via SUBCUTANEOUS
  Administered 2017-03-13: 5 [IU] via SUBCUTANEOUS
  Administered 2017-03-13: 2 [IU] via SUBCUTANEOUS
  Administered 2017-03-13: 8 [IU] via SUBCUTANEOUS
  Administered 2017-03-14 (×2): 5 [IU] via SUBCUTANEOUS
  Administered 2017-03-14: 8 [IU] via SUBCUTANEOUS
  Administered 2017-03-15: 2 [IU] via SUBCUTANEOUS
  Administered 2017-03-15 – 2017-03-16 (×2): 3 [IU] via SUBCUTANEOUS
  Administered 2017-03-16 (×2): 2 [IU] via SUBCUTANEOUS
  Administered 2017-03-17 (×2): 5 [IU] via SUBCUTANEOUS
  Administered 2017-03-17: 8 [IU] via SUBCUTANEOUS
  Administered 2017-03-18: 11 [IU] via SUBCUTANEOUS
  Administered 2017-03-18: 8 [IU] via SUBCUTANEOUS
  Filled 2017-03-11 (×21): qty 1

## 2017-03-11 MED ORDER — DEXTROSE-NACL 5-0.45 % IV SOLN
INTRAVENOUS | Status: DC
  Administered 2017-03-11 – 2017-03-12 (×2): via INTRAVENOUS

## 2017-03-11 MED ORDER — IPRATROPIUM-ALBUTEROL 0.5-2.5 (3) MG/3ML IN SOLN
3.0000 mL | RESPIRATORY_TRACT | Status: DC
  Administered 2017-03-11 – 2017-03-15 (×22): 3 mL via RESPIRATORY_TRACT
  Filled 2017-03-11 (×24): qty 3

## 2017-03-11 MED ORDER — AMIODARONE HCL IN DEXTROSE 360-4.14 MG/200ML-% IV SOLN
60.0000 mg/h | INTRAVENOUS | Status: AC
  Administered 2017-03-11: 60 mg/h via INTRAVENOUS
  Filled 2017-03-11 (×2): qty 200

## 2017-03-11 MED ORDER — HEPARIN SODIUM (PORCINE) 5000 UNIT/ML IJ SOLN
5000.0000 [IU] | Freq: Three times a day (TID) | INTRAMUSCULAR | Status: DC
  Administered 2017-03-11 – 2017-03-16 (×16): 5000 [IU] via SUBCUTANEOUS
  Filled 2017-03-11 (×16): qty 1

## 2017-03-11 MED ORDER — INSULIN ASPART 100 UNIT/ML ~~LOC~~ SOLN
0.0000 [IU] | Freq: Every day | SUBCUTANEOUS | Status: DC
Start: 2017-03-11 — End: 2017-03-18
  Administered 2017-03-12 – 2017-03-17 (×2): 4 [IU] via SUBCUTANEOUS
  Filled 2017-03-11 (×2): qty 1

## 2017-03-11 MED ORDER — INSULIN GLARGINE 100 UNIT/ML ~~LOC~~ SOLN
22.0000 [IU] | SUBCUTANEOUS | Status: DC
  Administered 2017-03-11 – 2017-03-12 (×2): 22 [IU] via SUBCUTANEOUS
  Filled 2017-03-11 (×2): qty 0.22

## 2017-03-11 NOTE — Progress Notes (Signed)
Daily Progress Note   Patient Name: Troy [name]       Date: 03/11/2017 DOB: 03-Nov-1923  Age: 83 y.o. MRN#: [id number] Attending Physician: Hillary Bow, MD Primary Care Physician: Tracie Harrier, MD Admit Date: 03/09/2017  Reason for Consultation/Follow-up: Establishing goals of care  Subjective: Mr. Tiegs is resting in bed, no longer on BiPAP. Wife present at bedside. She states they have been married 18 years and he has 4 children, and one of the children Fara Olden, is his POA. Mr. Delapena is feeling better since yesterday. Met with Mr. Sherrer, his wife, 2 of his children, one was Fara Olden the [place].  We  diagnosis, prognosis, GOC, EOL wishes disposition and options.  A detailed discussion was had today regarding advanced directives.  Concepts specific to code status, artifical feeding and hydration,  IV antibiotics and rehospitalization was had.  The difference between a aggressive medical intervention path  and a palliative or comfort care path for this patient was discussed.  Values and goals of care important to patient and family were discussed.  Patient is willing to try dialysis for a limited time if needed.  He does not want a feeding tube, intubation/mechanical ventilation, or cardioversion.   MOST form completed with patient and POA, signed by both.     Length of Stay: 1  Current Medications: Scheduled Meds:  . budesonide (PULMICORT) nebulizer solution  0.25 mg Nebulization BID  . carbidopa-levodopa  1 tablet Oral QID  . gabapentin  100 mg Oral TID  . guaiFENesin  600 mg Oral BID  . heparin subcutaneous  5,000 Units Subcutaneous Q8H  . insulin aspart  0-15 Units Subcutaneous TID WC  . insulin aspart  0-5 Units Subcutaneous QHS  . insulin glargine  22 Units Subcutaneous  Q24H  . ipratropium-albuterol  3 mL Nebulization Q4H  . methylPREDNISolone (SOLU-MEDROL) injection  20 mg Intravenous BID  . sodium chloride flush  3 mL Intravenous Q12H  . valACYclovir  1,000 mg Oral TID    Continuous Infusions: . sodium chloride    . amiodarone 30 mg/hr (03/11/17 1528)  . amiodarone Stopped (03/11/17 1004)  . dextrose 5 % and 0.45% NaCl 50 mL/hr at 03/11/17 0800  . phenylephrine (NEO-SYNEPHRINE) Adult infusion 0 mcg/min (03/10/17 2215)    PRN Meds: sodium chloride, acetaminophen **OR** acetaminophen, albuterol, amiodarone, dextrose,  HYDROcodone-acetaminophen, ondansetron **OR** ondansetron (ZOFRAN) IV, sodium chloride flush  Physical Exam  Constitutional: No distress.  HENT:  Head: Normocephalic.  Pulmonary/Chest: Effort normal.  Neurological: He is alert.            Vital Signs: BP 115/70   Pulse 95   Temp 98.4 F (36.9 C) (Oral)   Resp 18   Ht 5' 9.5" (1.765 m)   Wt 110.1 kg (242 lb 11.6 oz)   SpO2 96%   BMI 35.33 kg/m  SpO2: SpO2: 96 % O2 Device: O2 Device: Nasal Cannula O2 Flow Rate: O2 Flow Rate (L/min): 4 L/min  Intake/output summary:   Intake/Output Summary (Last 24 hours) at 03/11/2017 1735 Last data filed at 03/11/2017 1004 Gross per 24 hour  Intake 437.05 ml  Output 208 ml  Net 229.05 ml   LBM: Last BM Date: 03/11/17 Baseline Weight: Weight: 103.9 kg (229 lb) Most recent weight: Weight: 110.1 kg (242 lb 11.6 oz)       Palliative Assessment/Data: 30%      Patient Active Problem List   Diagnosis Date Noted  . Acute kidney injury ([place]) 03/10/2017  . Acute respiratory failure ([place]) 03/09/2017  . Acute on chronic respiratory failure ([place]) 05/09/2016  . Dehydration 03/21/2016  . Hypotension 03/21/2016  . Fall 03/21/2016  . Chest wall contusion, left, initial encounter 03/21/2016  . Hypoxia 03/21/2016  . Generalized weakness 03/21/2016  . Leukocytosis 03/21/2016  . Acute on chronic renal failure ([place]) 03/19/2016  . Acute  exacerbation of chronic obstructive pulmonary disease (COPD) ([place]) 12/07/2015  . Acute on chronic respiratory failure with hypoxia ([place]) 09/28/2015  . HTN (hypertension) 09/25/2015  . Parkinson's disease ([place]) 09/25/2015  . Hypothyroidism 09/25/2015  . COPD exacerbation ([place]) 09/12/2015  . Diabetes ([place]) 08/20/2015  . SSS (sick sinus syndrome) ([place]) 08/07/2015  . Chronic atrial fibrillation ([place])   . Sinus pause   . Sick sinus syndrome ([place])   . Acute on chronic diastolic CHF (congestive heart failure), NYHA class 1 ([place]) 08/03/2015  . Lower extremity weakness 02/25/2015    Palliative Care Assessment & Plan   Patient Profile: RobertGilmoreis a33 y.o.malewith a known history of chronic atrial fibrillation, COPD, chronic systolic CHF,, diabetes, essential hypertension, hypothyroidism, chronic respiratory failure, Parkinson's disease who is presenting with shortness of breath and respiratory difficulties. Patient stated that his symptoms started 2 days ago and has progressively gotten worse. He has been having significant wheezing and productive cough of yellow-green sputum. Patient came to the ER with the symptoms he is noted to be tachycardic with atrial fibrillation with rapid ventricular rate. Also tachypneic.       Assessment: Mr. Darden is improved, no longer on BiPAP.   Recommendations/Plan:  Continue current level of care.   Goals of Care and Additional Recommendations:  Limitations on Scope of Treatment: DNR  Code Status:    Code Status Orders  (From admission, onward)        Start     Ordered   03/09/17 1937  Do not attempt resuscitation (DNR)  Continuous    Question Answer Comment  In the event of cardiac or respiratory ARREST Do not call a "code blue"   In the event of cardiac or respiratory ARREST Do not perform Intubation, CPR, defibrillation or ACLS   In the event of cardiac or respiratory ARREST Use medication by any route, position, wound care,  and other measures to relive pain and suffering. May use oxygen, suction and manual treatment of airway obstruction as  needed for comfort.      03/09/17 1936    Code Status History    Date Active Date Inactive Code Status Order ID Comments User Context   05/09/2016 15:37 05/13/2016 18:31 DNR [id number]  Epifanio Lesches, MD ED   03/19/2016 16:54 03/21/2016 18:26 DNR [id number]  Baxter Hire, MD Inpatient   12/14/2015 12:09 12/16/2015 19:58 DNR [id number]  Vaughan Basta, MD Inpatient   12/13/2015 20:55 12/14/2015 12:09 Full Code [id number]  Harvie Bridge, DO ED   12/13/2015 20:50 12/13/2015 20:55 DNR [id number]  Harvie Bridge, DO ED   12/07/2015 06:27 12/11/2015 17:06 DNR [id number]  Saundra Shelling, MD ED   09/25/2015 01:18 09/28/2015 20:53 DNR [id number]  Lance Coon, MD Inpatient   08/07/2015 19:50 08/09/2015 15:23 DNR [id number]  Lonn Georgia, PA-C Inpatient   08/03/2015 16:58 08/07/2015 19:50 DNR [id number]  Henreitta Leber, MD ED   02/25/2015 19:56 03/01/2015 19:21 Full Code [id number]  Aldean Jewett, MD ED    Advance Directive Documentation     Most Recent Value  Type of Advance Directive  Living will  Pre-existing out of facility DNR order (yellow form or pink MOST form)  No data  "MOST" Form in Place?  No data       Prognosis:   Unable to determine  Discharge Planning:  To Be Determined   Thank you for allowing the Palliative Medicine Team to assist in the care of this patient.   Time In: 4:45 Time Out: 5:45 Total Time 1 hour  Prolonged Time Billed  YEs      Greater than 50%  of this time was spent counseling and coordinating care related to the above assessment and plan. Asencion Gowda, NP 03/11/2017 5:47 PM Office: [phone] 7am-7pm  Pager: [phone] Call primary team after hours  Please contact Palliative Medicine Team phone at [phone] for questions and concerns.

## 2017-03-11 NOTE — Progress Notes (Addendum)
Inpatient Diabetes Program Recommendations  AACE/ADA: New Consensus Statement on Inpatient Glycemic Control (2015)  Target Ranges:  Prepandial:   less than 140 mg/dL      Peak postprandial:   less than 180 mg/dL (1-2 hours)      Critically ill patients:  140 - 180 mg/dL   Lab Results  Component Value Date   GLUCAP 73 03/11/2017   HGBA1C 9.1 (H) 03/20/2016   Review of Glycemic Control  Diabetes history: DM 2 Outpatient Diabetes medications: Lantus 20 units, Novolog Regular 30 units breakfast, 10 units lunch, 25 units supper, Glipizide 10 mg Daily, Onglyza 5 mg Daily Current orders for Inpatient glycemic control: IV insulin gtt  Inpatient Diabetes Program Recommendations:    No longer on steroids. Large increase in creatinine.  Please d/c oral medications due to renal function consider Lantus 22 units Q24 hours and Novolog Sensitive 0-9 units tid + HS scale.  Give insulin once glucose is within normal range, approx 150 mg/dl. Patient received Oral DM medications, monitor for hypoglycemia with elevated renal function. Patient would possibly need meal coverage when eating based on meal coverage taken at home.  Thanks,  Christena DeemShannon Teyanna Thielman RN, MSN, [facility] Inpatient Diabetes Coordinator Team Pager [phone] (8a-5p)

## 2017-03-11 NOTE — Progress Notes (Signed)
[employer] - [place] [facility]   PATIENT NAME: Troy [name]    MR#:  [id number]  DATE OF BIRTH:  [birth year]  SUBJECTIVE:  CHIEF COMPLAINT:   Chief Complaint  Patient presents with  . Shortness of Breath  . Weakness   Continue to have shortness of breath and weakness.  Poor appetite.  On phenylephrine.  REVIEW OF SYSTEMS:    Review of Systems  Constitutional: Positive for malaise/fatigue. Negative for chills and fever.  HENT: Negative for sore throat.   Eyes: Negative for blurred vision, double vision and pain.  Respiratory: Positive for cough, shortness of breath and wheezing. Negative for hemoptysis.   Cardiovascular: Negative for chest pain, palpitations, orthopnea and leg swelling.  Gastrointestinal: Negative for abdominal pain, constipation, diarrhea, heartburn, nausea and vomiting.  Genitourinary: Negative for dysuria and hematuria.  Musculoskeletal: Negative for back pain and joint pain.  Skin: Negative for rash.  Neurological: Positive for weakness. Negative for sensory change, speech change, focal weakness and headaches.  Endo/Heme/Allergies: Does not bruise/bleed easily.  Psychiatric/Behavioral: Negative for depression. The patient is not nervous/anxious.     DRUG ALLERGIES:  No Known Allergies  VITALS:  Blood pressure 108/71, pulse 100, temperature 98.4 F (36.9 C), temperature source Oral, resp. rate 17, height 5' 9.5" (1.765 m), weight 110.1 kg (242 lb 11.6 oz), SpO2 99 %.  PHYSICAL EXAMINATION:   Physical Exam  GENERAL:  83 y.o.-year-old patient lying in the bed .  Obese.  In respiratory distress. EYES: . No scleral icterus. Extraocular muscles intact.  HEENT: Head atraumatic, normocephalic. Oropharynx and nasopharynx clear.  NECK:  Supple, no jugular venous distention. No thyroid enlargement, no tenderness.  LUNGS:  bilateral wheezing and crackles.  Decreased air entry. CARDIOVASCULAR: S1, S2 tachycardiac ABDOMEN: Soft,  nontender, nondistended. Bowel sounds present. No organomegaly or mass.  EXTREMITIES: No cyanosis, clubbing or edema b/l.    NEUROLOGIC: Cranial nerves II through XII are intact. No focal Motor or sensory deficits b/l.   PSYCHIATRIC: The patient is alert and awake SKIN: No obvious rash, lesion, or ulcer.   LABORATORY PANEL:   CBC Recent Labs  Lab 03/11/17 1026  WBC 24.1*  HGB 13.0  HCT 40.3  PLT 183   ------------------------------------------------------------------------------------------------------------------ Chemistries  Recent Labs  Lab 03/10/17 0431  03/11/17 1026  NA 132*   < > 132*  K 4.0   < > 4.2  CL 95*   < > 94*  CO2 23   < > 23  GLUCOSE 545*   < > 285*  BUN 42*   < > 81*  CREATININE 3.77*   < > 5.23*  CALCIUM 8.2*   < > 8.2*  MG 2.3  --   --   AST  --   --  35  ALT  --   --  8*  ALKPHOS  --   --  78  BILITOT  --   --  0.9   < > = values in this interval not displayed.   ------------------------------------------------------------------------------------------------------------------  Cardiac Enzymes Recent Labs  Lab 03/09/17 1115  TROPONINI 0.03*   ------------------------------------------------------------------------------------------------------------------  RADIOLOGY:  [place] Renal  Result Date: 03/10/2017 CLINICAL DATA:  83 year old male with acute renal failure. Initial encounter. EXAM: RENAL / URINARY TRACT ULTRASOUND COMPLETE COMPARISON:  01/16/2017 CT. FINDINGS: Right Kidney: Length: 15.6 cm. No hydronephrosis. Multiple cysts measuring up to 6.1 cm. Left Kidney: Length: 13.2 cm. No hydronephrosis. Multiple cysts measuring up to 9.2 cm. Bladder: Not visualized secondary to under distension  and bowel gas. IMPRESSION: No hydronephrosis.  Large bilateral renal cysts. Electronically Signed   By: Lacy DuverneySteven  Olson M.D.   On: 03/10/2017 11:33   Dg Chest Port 1 View  Result Date: 03/10/2017 CLINICAL DATA:  CHF EXAM: PORTABLE CHEST 1 VIEW COMPARISON:   03/09/2017 FINDINGS: Increased interstitial markings, favoring mild interstitial edema. No definite pleural effusions. No pneumothorax. Cardiomegaly.  Prosthetic valve.  Left subclavian pacemaker. Median sternotomy. IMPRESSION: Cardiomegaly with suspected mild interstitial edema. No definite pleural effusions. Electronically Signed   By: Charline BillsSriyesh  Krishnan M.D.   On: 03/10/2017 11:33     ASSESSMENT AND PLAN:   *Acute on chronic systolic congestive heart failure with acute on chronic respiratory failure Patient has poor urine output.  Also hypotension/aki is limiting diuresis.    *Acute COPD exacerbation  -IV steroids.  Dosing reduced - Scheduled Nebulizers - Inhalers -Wean O2 as tolerated -Appreciate pulmonary help  *Acute kidney injury over CKD stage III due to cardiorenal syndrome.   Patient does not want dialysis at this time..  *Atrial fibrillation with rapid ventricular rate. Amiodarone drip  *Insulin-dependent diabetes mellitus with severe hyperglycemia.   Insulin drip for hyperglycemia secondary to steroids  * DVT prophylaxis.   Subcu heparin  All the records are reviewed and case discussed with Care Management/Social Workerr. Management plans discussed with the patient, family and they are in agreement.  CODE STATUS: DNR  DVT Prophylaxis: SCDs  TOTAL TIME TAKING CARE OF THIS PATIENT: 35 minutes.   Troy LollSudini, Troy [name] R M.D on 03/11/2017 at 3:17 PM  Between 7am to 6pm - Pager - [phone]  After 6pm go to [email or web address] - password [email or web address] [facility]  [facility] [place] [facility]  Office  [phone]  CC: Primary care physician; Barbette ReichmannHande, Vishwanath, MD  Note: This dictation was prepared with Dragon dictation along with smaller phrase technology. Any transcriptional errors that result from this process are unintentional.

## 2017-03-11 NOTE — Plan of Care (Deleted)
Pt resting in bed with wife at bedside. Off BIPAP. Company present in room visiting. Will return tomorrow.

## 2017-03-11 NOTE — Progress Notes (Signed)
[facility]  ROUNDING NOTE   Subjective:   Transferred to ICU.   4 [place] - BIPAP overnight  Insulin gtt  Off phenylephrine  Echo: diastolic not able to be assessed  UOP is not accurate - leaky foley  Objective:  Vital signs in last 24 hours:  Temp:  [98.2 F (36.8 C)-98.4 F (36.9 C)] 98.4 F (36.9 C) (11/07 0800) Pulse Rate:  [94-120] 112 (11/07 0900) Resp:  [12-21] 17 (11/07 0900) BP: (81-144)/(42-97) 144/68 (11/07 0900) SpO2:  [84 %-99 %] 92 % (11/07 0900) FiO2 (%):  [35 %] 35 % (11/06 2015) Weight:  [110.1 kg (242 lb 11.6 oz)] 110.1 kg (242 lb 11.6 oz) (11/06 1346)  Weight change: 6.226 kg (13 lb 11.6 oz) Filed Weights   03/09/17 1115 03/09/17 1914 03/10/17 1346  Weight: 103.9 kg (229 lb) 108.5 kg (239 lb 3.2 oz) 110.1 kg (242 lb 11.6 oz)    Intake/Output: I/O last 3 completed shifts: In: 771.4 [P.O.:360; I.V.:411.4] Out: 183 [Urine:183]   Intake/Output this shift:  Total I/O In: 25.6 [I.V.:25.6] Out: -   Physical Exam: General: Ill   Head: Normocephalic, atraumatic. Moist oral mucosal membranes  Eyes: Anicteric, PERRL  Neck: Supple, trachea midline  Lungs:  wheezes  Heart: irregular  Abdomen:  Soft, nontender,   Extremities: trace peripheral edema.  Neurologic: Nonfocal, moving all four extremities  Skin: No lesions  GU: foley    Basic Metabolic Panel: Recent Labs  Lab 03/09/17 1115 03/10/17 0431 03/10/17 1354 03/10/17 1835  NA 137 132*  --  128*  K 3.9 4.0  --  4.8  CL 98* 95*  --  92*  CO2 28 23  --  22  GLUCOSE 265* 545*  --  429*  BUN 22* 42*  --  57*  CREATININE 1.43* 3.77* 5.06* 4.90*  CALCIUM 8.3* 8.2*  --  8.3*  MG  --  2.3  --   --     Liver Function Tests: No results for input(s): AST, ALT, ALKPHOS, BILITOT, PROT, ALBUMIN in the last 168 hours. No results for input(s): LIPASE, AMYLASE in the last 168 hours. No results for input(s): AMMONIA in the last 168 hours.  CBC: Recent Labs  Lab 03/09/17 1115  03/10/17 0431  WBC 7.6 13.0*  HGB 13.9 12.3*  HCT 41.9 37.9*  MCV 93.3 93.8  PLT 140* 152    Cardiac Enzymes: Recent Labs  Lab 03/09/17 1115  TROPONINI 0.03*    BNP: Invalid input(s): POCBNP  CBG: Recent Labs  Lab 03/11/17 0430 03/11/17 0534 03/11/17 0635 03/11/17 0743 03/11/17 0841  GLUCAP 158* 134* 99 73 68    Microbiology: Results for orders placed or performed during the hospital encounter of 03/09/17  MRSA PCR Screening     Status: None   Collection Time: 03/10/17  1:43 PM  Result Value Ref Range Status   MRSA by PCR NEGATIVE NEGATIVE Final    Comment:        The GeneXpert MRSA Assay (FDA approved for NASAL specimens only), is one component of a comprehensive MRSA colonization surveillance program. It is not intended to diagnose MRSA infection nor to guide or monitor treatment for MRSA infections.     Coagulation Studies: No results for input(s): LABPROT, INR in the last 72 hours.  Urinalysis: Recent Labs    03/10/17 0612  COLORURINE AMBER*  LABSPEC 1.017  PHURINE 5.0  GLUCOSEU 50*  HGBUR MODERATE*  BILIRUBINUR NEGATIVE  KETONESUR 5*  PROTEINUR 30*  NITRITE NEGATIVE  LEUKOCYTESUR TRACE*      Imaging: [place] Renal  Result Date: 03/10/2017 CLINICAL DATA:  83 year old male with acute renal failure. Initial encounter. EXAM: RENAL / URINARY TRACT ULTRASOUND COMPLETE COMPARISON:  01/16/2017 CT. FINDINGS: Right Kidney: Length: 15.6 cm. No hydronephrosis. Multiple cysts measuring up to 6.1 cm. Left Kidney: Length: 13.2 cm. No hydronephrosis. Multiple cysts measuring up to 9.2 cm. Bladder: Not visualized secondary to under distension and bowel gas. IMPRESSION: No hydronephrosis.  Large bilateral renal cysts. Electronically Signed   By: Lacy DuverneySteven  Olson M.D.   On: 03/10/2017 11:33   Dg Chest Port 1 View  Result Date: 03/10/2017 CLINICAL DATA:  CHF EXAM: PORTABLE CHEST 1 VIEW COMPARISON:  03/09/2017 FINDINGS: Increased interstitial markings, favoring  mild interstitial edema. No definite pleural effusions. No pneumothorax. Cardiomegaly.  Prosthetic valve.  Left subclavian pacemaker. Median sternotomy. IMPRESSION: Cardiomegaly with suspected mild interstitial edema. No definite pleural effusions. Electronically Signed   By: Charline BillsSriyesh  Krishnan M.D.   On: 03/10/2017 11:33   Dg Chest Portable 1 View  Result Date: 03/09/2017 CLINICAL DATA:  PORTABLE CHEST 1 VIEW COMPARISON:  None. FINDINGS: Sternotomy wires overlie normal enlarged cardiac silhouette. There is mild airspace disease similar to prior. No focal infiltrate. No pneumothorax. IMPRESSION: Cardiomegaly and mild pulmonary edema.  No interval change. Electronically Signed   By: Genevive BiStewart  Edmunds M.D.   On: 03/09/2017 12:34     Medications:   . sodium chloride    . amiodarone    . insulin (NOVOLIN-R) infusion 0.7 Units/hr (03/11/17 0748)  . phenylephrine (NEO-SYNEPHRINE) Adult infusion 0 mcg/min (03/10/17 2215)   . budesonide (PULMICORT) nebulizer solution  0.25 mg Nebulization BID  . carbidopa-levodopa  1 tablet Oral QID  . gabapentin  100 mg Oral TID  . glipiZIDE  10 mg Oral QAC breakfast  . guaiFENesin  600 mg Oral BID  . insulin regular  0-10 Units Intravenous TID WC  . ipratropium  0.5 mg Nebulization Q6H  . levalbuterol  0.63 mg Nebulization Q6H  . linagliptin  5 mg Oral Daily  . loratadine  10 mg Oral Daily  . sodium chloride flush  3 mL Intravenous Q12H  . valACYclovir  1,000 mg Oral TID   sodium chloride, acetaminophen **OR** acetaminophen, albuterol, amiodarone, dextrose, HYDROcodone-acetaminophen, ondansetron **OR** ondansetron (ZOFRAN) IV, sodium chloride flush  Assessment/ Plan:  Mr. Dannielle BurnRobert S Delong is a 83 y.o. white male with hypertension, COPD, coronary artery disease status post CABG, congestive heart failure, atrial fibrillation, AICD placement, CVA, parkinson's, hypothyroidism, diabetes mellitus type II, who [facility] for COPD exacerbation  (HCC) [J44.1] Acute respiratory failure with hypoxia (HCC) [J96.01] Atrial fibrillation with RVR (HCC) [I48.91] Community acquired pneumonia, unspecified laterality [J18.9]   1. Acute renal failure on chronic kidney disease stage III: baseline creatinine of 1.23, GFR of 49.  Nonoliguric urine output Acute renal failure secondary to acute cardiorenal syndrome.  Chronic kidney disease secondary to diabetes and hypertension - Pending labs today - holding furosemide.  - holding lisinopril - Patient not a candidate for dialysis  2. Hypertension: with acute exacerbation of congestive heart failure systolic and atrial fibrillation - amiodarone gtt   3. Acute respiratory failure: unclear how much is due to congestive heart failure.  Acute exacerbation of COPD. Wheezing on examination.  - nebs - noninvasive ventilation as needed.  - Steroids systemic.      LOS: 1 Uzziah Rigg 11/7/20189:12 AM

## 2017-03-12 LAB — BASIC METABOLIC PANEL
Anion gap: 18 — ABNORMAL HIGH (ref 5–15)
BUN: 104 mg/dL — ABNORMAL HIGH (ref 6–20)
CALCIUM: 7.3 mg/dL — AB (ref 8.9–10.3)
CO2: 18 mmol/L — AB (ref 22–32)
CREATININE: 5.04 mg/dL — AB (ref 0.61–1.24)
Chloride: 87 mmol/L — ABNORMAL LOW (ref 101–111)
GFR calc non Af Amer: 9 mL/min — ABNORMAL LOW (ref >60)
GFR, EST AFRICAN AMERICAN: 10 mL/min — AB (ref >60)
GLUCOSE: 448 mg/dL — AB (ref 65–99)
Potassium: 5.2 mmol/L — ABNORMAL HIGH (ref 3.5–5.1)
Sodium: 123 mmol/L — ABNORMAL LOW (ref 135–145)

## 2017-03-12 LAB — GLUCOSE, CAPILLARY
GLUCOSE-CAPILLARY: 343 mg/dL — AB (ref 65–99)
Glucose-Capillary: 416 mg/dL — ABNORMAL HIGH (ref 65–99)
Glucose-Capillary: 404 mg/dL — ABNORMAL HIGH (ref 65–99)
Glucose-Capillary: 318 mg/dL — ABNORMAL HIGH (ref 65–99)

## 2017-03-12 MED ORDER — INSULIN ASPART 100 UNIT/ML ~~LOC~~ SOLN
3.0000 [IU] | Freq: Once | SUBCUTANEOUS | Status: AC
  Administered 2017-03-12: 3 [IU] via SUBCUTANEOUS
  Filled 2017-03-12: qty 1

## 2017-03-12 MED ORDER — INSULIN GLARGINE 100 UNIT/ML ~~LOC~~ SOLN
5.0000 [IU] | Freq: Once | SUBCUTANEOUS | Status: AC
  Administered 2017-03-12: 5 [IU] via SUBCUTANEOUS
  Filled 2017-03-12: qty 0.05

## 2017-03-12 MED ORDER — INSULIN GLARGINE 100 UNIT/ML ~~LOC~~ SOLN
28.0000 [IU] | SUBCUTANEOUS | Status: DC
  Administered 2017-03-13: 28 [IU] via SUBCUTANEOUS
  Filled 2017-03-12: qty 0.28

## 2017-03-12 MED ORDER — INSULIN ASPART 100 UNIT/ML ~~LOC~~ SOLN
12.0000 [IU] | Freq: Three times a day (TID) | SUBCUTANEOUS | Status: DC
  Administered 2017-03-12 – 2017-03-13 (×3): 12 [IU] via SUBCUTANEOUS
  Filled 2017-03-12 (×3): qty 1

## 2017-03-12 MED ORDER — AMIODARONE HCL 200 MG PO TABS
200.0000 mg | ORAL_TABLET | Freq: Two times a day (BID) | ORAL | Status: DC
  Administered 2017-03-12 – 2017-03-18 (×13): 200 mg via ORAL
  Filled 2017-03-12 (×13): qty 1

## 2017-03-12 MED ORDER — STERILE WATER FOR INJECTION IV SOLN
INTRAVENOUS | Status: DC
  Administered 2017-03-12: 17:00:00 via INTRAVENOUS
  Filled 2017-03-12 (×2): qty 850

## 2017-03-12 NOTE — Progress Notes (Signed)
[facility]  ROUNDING NOTE   Subjective:   UOP 550  Amiodarone gtt   Objective:  Vital signs in last 24 hours:  Temp:  [98.3 F (36.8 C)-98.5 F (36.9 C)] 98.4 F (36.9 C) (11/08 0100) Pulse Rate:  [60-123] 60 (11/08 0600) Resp:  [11-21] 16 (11/08 0600) BP: (88-123)/(57-85) 109/61 (11/08 0600) SpO2:  [84 %-100 %] 93 % (11/08 0600)  Weight change:  Filed Weights   03/09/17 1115 03/09/17 1914 03/10/17 1346  Weight: 103.9 kg (229 lb) 108.5 kg (239 lb 3.2 oz) 110.1 kg (242 lb 11.6 oz)    Intake/Output: I/O last 3 completed shifts: In: 2533.7 [I.V.:2533.7] Out: 550 [Urine:550]   Intake/Output this shift:  No intake/output data recorded.  Physical Exam: General: Ill   Head: Normocephalic, atraumatic. Moist oral mucosal membranes  Eyes: Anicteric, PERRL  Neck: Supple, trachea midline  Lungs:  wheezes  Heart: irregular  Abdomen:  Soft, nontender,   Extremities: trace peripheral edema.  Neurologic: Nonfocal, moving all four extremities  Skin: No lesions  GU: foley    Basic Metabolic Panel: Recent Labs  Lab 03/09/17 1115 03/10/17 0431 03/10/17 1354 03/10/17 1835 03/11/17 1026  NA 137 132*  --  128* 132*  K 3.9 4.0  --  4.8 4.2  CL 98* 95*  --  92* 94*  CO2 28 23  --  22 23  GLUCOSE 265* 545*  --  429* 285*  BUN 22* 42*  --  57* 81*  CREATININE 1.43* 3.77* 5.06* 4.90* 5.23*  CALCIUM 8.3* 8.2*  --  8.3* 8.2*  MG  --  2.3  --   --   --     Liver Function Tests: Recent Labs  Lab 03/11/17 1026  AST 35  ALT 8*  ALKPHOS 78  BILITOT 0.9  PROT 7.2  ALBUMIN 3.4*   No results for input(s): LIPASE, AMYLASE in the last 168 hours. No results for input(s): AMMONIA in the last 168 hours.  CBC: Recent Labs  Lab 03/09/17 1115 03/10/17 0431 03/11/17 1026  WBC 7.6 13.0* 24.1*  HGB 13.9 12.3* 13.0  HCT 41.9 37.9* 40.3  MCV 93.3 93.8 92.3  PLT 140* 152 183    Cardiac Enzymes: Recent Labs  Lab 03/09/17 1115  TROPONINI 0.03*     BNP: Invalid input(s): POCBNP  CBG: Recent Labs  Lab 03/11/17 1202 03/11/17 1650 03/11/17 1952 03/11/17 2337 03/12/17 0751  GLUCAP 160* 151* 172* 229* 343*    Microbiology: Results for orders placed or performed during the hospital encounter of 03/09/17  MRSA PCR Screening     Status: None   Collection Time: 03/10/17  1:43 PM  Result Value Ref Range Status   MRSA by PCR NEGATIVE NEGATIVE Final    Comment:        The GeneXpert MRSA Assay (FDA approved for NASAL specimens only), is one component of a comprehensive MRSA colonization surveillance program. It is not intended to diagnose MRSA infection nor to guide or monitor treatment for MRSA infections.     Coagulation Studies: No results for input(s): LABPROT, INR in the last 72 hours.  Urinalysis: Recent Labs    03/10/17 0612  COLORURINE AMBER*  LABSPEC 1.017  PHURINE 5.0  GLUCOSEU 50*  HGBUR MODERATE*  BILIRUBINUR NEGATIVE  KETONESUR 5*  PROTEINUR 30*  NITRITE NEGATIVE  LEUKOCYTESUR TRACE*      Imaging: [place] Renal  Result Date: 03/10/2017 CLINICAL DATA:  83 year old male with acute renal failure. Initial encounter. EXAM: RENAL / URINARY  TRACT ULTRASOUND COMPLETE COMPARISON:  01/16/2017 CT. FINDINGS: Right Kidney: Length: 15.6 cm. No hydronephrosis. Multiple cysts measuring up to 6.1 cm. Left Kidney: Length: 13.2 cm. No hydronephrosis. Multiple cysts measuring up to 9.2 cm. Bladder: Not visualized secondary to under distension and bowel gas. IMPRESSION: No hydronephrosis.  Large bilateral renal cysts. Electronically Signed   By: Lacy DuverneySteven  Olson M.D.   On: 03/10/2017 11:33   Dg Chest Port 1 View  Result Date: 03/10/2017 CLINICAL DATA:  CHF EXAM: PORTABLE CHEST 1 VIEW COMPARISON:  03/09/2017 FINDINGS: Increased interstitial markings, favoring mild interstitial edema. No definite pleural effusions. No pneumothorax. Cardiomegaly.  Prosthetic valve.  Left subclavian pacemaker. Median sternotomy. IMPRESSION:  Cardiomegaly with suspected mild interstitial edema. No definite pleural effusions. Electronically Signed   By: Charline BillsSriyesh  Krishnan M.D.   On: 03/10/2017 11:33     Medications:   . sodium chloride    . amiodarone 30 mg/hr (03/11/17 2122)  . amiodarone Stopped (03/11/17 1004)  . dextrose 5 % and 0.45% NaCl 50 mL/hr at 03/12/17 0413  . phenylephrine (NEO-SYNEPHRINE) Adult infusion 0 mcg/min (03/10/17 2215)   . budesonide (PULMICORT) nebulizer solution  0.25 mg Nebulization BID  . carbidopa-levodopa  1 tablet Oral QID  . gabapentin  100 mg Oral TID  . guaiFENesin  600 mg Oral BID  . heparin subcutaneous  5,000 Units Subcutaneous Q8H  . insulin aspart  0-15 Units Subcutaneous TID WC  . insulin aspart  0-5 Units Subcutaneous QHS  . insulin glargine  22 Units Subcutaneous Q24H  . ipratropium-albuterol  3 mL Nebulization Q4H  . methylPREDNISolone (SOLU-MEDROL) injection  20 mg Intravenous BID  . sodium chloride flush  3 mL Intravenous Q12H  . valACYclovir  1,000 mg Oral TID   sodium chloride, acetaminophen **OR** acetaminophen, albuterol, amiodarone, dextrose, HYDROcodone-acetaminophen, ondansetron **OR** ondansetron (ZOFRAN) IV, sodium chloride flush  Assessment/ Plan:  Mr. Dannielle BurnRobert S Rochin is a 83 y.o. white male with hypertension, COPD, coronary artery disease status post CABG, congestive heart failure, atrial fibrillation, AICD placement, CVA, parkinson's, hypothyroidism, diabetes mellitus type II, who [facility]    1. Acute renal failure on chronic kidney disease stage III: baseline creatinine of 1.23, GFR of 49.  Nonoliguric urine output. Creatinine slowly improving. Volume status acceptable.  Acute renal failure secondary to acute cardiorenal syndrome.  Chronic kidney disease secondary to diabetes and hypertension - holding furosemide.  - holding lisinopril - No acute indication for dialysis  2. Hypertension: with acute exacerbation of congestive heart  failure systolic and atrial fibrillation - amiodarone  3. Acute respiratory failure: unclear how much is due to congestive heart failure.  Acute exacerbation of COPD. Wheezing on examination.  - continue supportive care.      LOS: 2 [place] Lingenfelter 11/8/20189:16 AM

## 2017-03-12 NOTE — Progress Notes (Signed)
PT Cancellation Note  Patient Details Name: Troy BurnRobert [name] MRN: [id number] DOB: [birth year]   Cancelled Treatment:    Reason Eval/Treat Not Completed: Fatigue/lethargy limiting ability to participate Spoke with nursing who reports that they have just gotten him back to bed and that he is exhausted.  She requests that we hold PT for today and try back tomorrow secondary to pt fatigue.   [name], DPT 03/12/2017, 2:35 PM

## 2017-03-12 NOTE — Progress Notes (Signed)
Inpatient Diabetes Program Recommendations  AACE/ADA: New Consensus Statement on Inpatient Glycemic Control (2015)  Target Ranges:  Prepandial:   less than 140 mg/dL      Peak postprandial:   less than 180 mg/dL (1-2 hours)      Critically ill patients:  140 - 180 mg/dL   Lab Results  Component Value Date   GLUCAP 416 (H) 03/12/2017   HGBA1C 9.1 (H) 03/20/2016    Review of Glycemic Control  Results for [name], Keyen S (MRN [id number]) as of 03/12/2017 13:07  Ref. Range 03/11/2017 16:50 03/11/2017 19:52 03/11/2017 23:37 03/12/2017 07:51 03/12/2017 11:51  Glucose-Capillary Latest Ref Range: 65 - 99 mg/dL 409151 (H) 811172 (H) 914229 (H) 343 (H) 416 (H)    Diabetes history: DM 2 Outpatient Diabetes medications: Lantus 20 units, Novolog Regular 30 units breakfast, 10 units lunch, 25 units supper, Glipizide 10 mg Daily, Onglyza 5 mg Daily  Current orders for Inpatient glycemic control: Lantus 22 units qhs, Novolog 0-15 units tid, Novolog 0-5 units qhs.   Inpatient Diabetes Program Recommendations:    Consider increasing Lantus to 28 units qhs and add Novolog 12 units tid with meals (hold if patient eats less than 50%).    Consider decreasing Novolog correction to 0-9 tid, and add Novolog 0-5 units qhs.  Per ADA recommendations "consider performing an A1C on all patients with diabetes or hyperglycemia admitted to the hospital if not performed in the prior 3 months".   Susette RacerJulie Izacc Demeyer, RN, BA, MHA, CDE Diabetes Coordinator Inpatient Diabetes Program  [phone] (Team Pager) [phone] [facility] Office) 03/12/2017 1:15 PM

## 2017-03-12 NOTE — Progress Notes (Signed)
1800 Good day. Weaned to 2 liters nasal canuia. Sat up in chair x 3 hours. Unsteady when standing. Transfer from bed to chair and back very difficult with 2 people.Tries to eat too fast but otherwise respirations are easier with less wheezing noted.Wife nvery realistic about care and understands she cannot take care of him at home until he can ambulate. Denies pain Amiodorone drip weaned of after taking oral  Pacerone 200 mgs.  Blood work - sodium down to 123 ,K+ up to 5.2,BUN and Creatine also up. Started on Sodium Bicarb. drip at 50 mls/hour. Urine output still low.

## 2017-03-12 NOTE — Progress Notes (Signed)
[employer] - [place] at [facility]   PATIENT NAME: Troy [name]    MR#:  [id number]  DATE OF BIRTH:  [birth year]  SUBJECTIVE:  CHIEF COMPLAINT:   Chief Complaint  Patient presents with  . Shortness of Breath  . Weakness   Sitting in a chair. Off pressors.  On amiodarone drip. Breathing better.   REVIEW OF SYSTEMS:    Review of Systems  Constitutional: Positive for malaise/fatigue. Negative for chills and fever.  HENT: Negative for sore throat.   Eyes: Negative for blurred vision, double vision and pain.  Respiratory: Positive for cough, shortness of breath and wheezing. Negative for hemoptysis.   Cardiovascular: Negative for chest pain, palpitations, orthopnea and leg swelling.  Gastrointestinal: Negative for abdominal pain, constipation, diarrhea, heartburn, nausea and vomiting.  Genitourinary: Negative for dysuria and hematuria.  Musculoskeletal: Negative for back pain and joint pain.  Skin: Negative for rash.  Neurological: Positive for weakness. Negative for sensory change, speech change, focal weakness and headaches.  Endo/Heme/Allergies: Does not bruise/bleed easily.  Psychiatric/Behavioral: Negative for depression. The patient is not nervous/anxious.     DRUG ALLERGIES:  No Known Allergies  VITALS:  Blood pressure 138/69, pulse 83, temperature 98.1 F (36.7 C), resp. rate (!) 22, height 5' 9.5" (1.765 m), weight 110.1 kg (242 lb 11.6 oz), SpO2 96 %.  PHYSICAL EXAMINATION:   Physical Exam  GENERAL:  83 y.o.-year-old patient lying in the bed .  Obese.  In respiratory distress. EYES: . No scleral icterus. Extraocular muscles intact.  HEENT: Head atraumatic, normocephalic. Oropharynx and nasopharynx clear.  NECK:  Supple, no jugular venous distention. No thyroid enlargement, no tenderness.  LUNGS:  bilateral wheezing and crackles.  Decreased air entry. CARDIOVASCULAR: S1, S2 tachycardiac. ABDOMEN: Soft, nontender, nondistended. Bowel sounds  present. No organomegaly or mass.  EXTREMITIES: No cyanosis, clubbing or edema b/l.    NEUROLOGIC: Cranial nerves II through XII are intact. No focal Motor or sensory deficits b/l.   PSYCHIATRIC: The patient is alert and awake SKIN: No obvious rash, lesion, or ulcer.   LABORATORY PANEL:   CBC Recent Labs  Lab 03/11/17 1026  WBC 24.1*  HGB 13.0  HCT 40.3  PLT 183   ------------------------------------------------------------------------------------------------------------------ Chemistries  Recent Labs  Lab 03/10/17 0431  03/11/17 1026  NA 132*   < > 132*  K 4.0   < > 4.2  CL 95*   < > 94*  CO2 23   < > 23  GLUCOSE 545*   < > 285*  BUN 42*   < > 81*  CREATININE 3.77*   < > 5.23*  CALCIUM 8.2*   < > 8.2*  MG 2.3  --   --   AST  --   --  35  ALT  --   --  8*  ALKPHOS  --   --  78  BILITOT  --   --  0.9   < > = values in this interval not displayed.   ------------------------------------------------------------------------------------------------------------------  Cardiac Enzymes Recent Labs  Lab 03/09/17 1115  TROPONINI 0.03*   ------------------------------------------------------------------------------------------------------------------  RADIOLOGY:  No results found.   ASSESSMENT AND PLAN:   *Acute on chronic systolic congestive heart failure with acute on chronic respiratory failure Patient has poor urine output.  Also hypotension/aki is limiting diuresis.    * Acute COPD exacerbation  - Off steroids - Scheduled Nebulizers - Inhalers -Wean O2 as tolerated -Appreciate pulmonary help  * Acute kidney injury over CKD stage III due  to cardiorenal syndrome.   Patient does not want dialysis at this time..  * Atrial fibrillation with rapid ventricular rate. Amiodarone drip  * Insulin-dependent diabetes mellitus with severe hyperglycemia.   BS worse now that he is off insulin drip  * DVT prophylaxis.   Subcu heparin  All the records are reviewed  and case discussed with Care Management/Social Worker Management plans discussed with the patient, family and they are in agreement.  CODE STATUS: DNR  DVT Prophylaxis: SCDs  TOTAL TIME TAKING CARE OF THIS PATIENT: 35 minutes.   Milagros LollSudini, Lamica Mccart R M.D on 03/12/2017 at 1:25 PM  Between 7am to 6pm - Pager - [phone]  After 6pm go to [email or web address] - password [email or web address] [facility]  [facility] [place] [facility]  Office  [phone]  CC: Primary care physician; Barbette ReichmannHande, Vishwanath, MD  Note: This dictation was prepared with Dragon dictation along with smaller phrase technology. Any transcriptional errors that result from this process are unintentional.

## 2017-03-13 ENCOUNTER — Inpatient Hospital Stay: Payer: Medicare Other

## 2017-03-13 LAB — CBC
HCT: 36.7 % — ABNORMAL LOW (ref 40.0–52.0)
Hemoglobin: 12.1 g/dL — ABNORMAL LOW (ref 13.0–18.0)
MCH: 30 pg (ref 26.0–34.0)
MCHC: 33 g/dL (ref 32.0–36.0)
MCV: 91 fL (ref 80.0–100.0)
Platelets: 165 K/uL (ref 150–440)
RBC: 4.03 MIL/uL — ABNORMAL LOW (ref 4.40–5.90)
RDW: 14.8 % — ABNORMAL HIGH (ref 11.5–14.5)
WBC: 13.1 K/uL — ABNORMAL HIGH (ref 3.8–10.6)

## 2017-03-13 LAB — BASIC METABOLIC PANEL
Anion gap: 14 (ref 5–15)
Anion gap: 13 (ref 5–15)
BUN: 114 mg/dL — AB (ref 6–20)
BUN: 97 mg/dL — ABNORMAL HIGH (ref 6–20)
CHLORIDE: 92 mmol/L — AB (ref 101–111)
CO2: 23 mmol/L (ref 22–32)
CO2: 25 mmol/L (ref 22–32)
CREATININE: 3.82 mg/dL — AB (ref 0.61–1.24)
Calcium: 7.3 mg/dL — ABNORMAL LOW (ref 8.9–10.3)
Calcium: 7.4 mg/dL — ABNORMAL LOW (ref 8.9–10.3)
Chloride: 89 mmol/L — ABNORMAL LOW (ref 101–111)
Creatinine, Ser: 4.99 mg/dL — ABNORMAL HIGH (ref 0.61–1.24)
GFR calc Af Amer: 11 mL/min — ABNORMAL LOW (ref >60)
GFR calc non Af Amer: 13 mL/min — ABNORMAL LOW (ref >60)
GFR, EST AFRICAN AMERICAN: 15 mL/min — AB (ref >60)
GFR, EST NON AFRICAN AMERICAN: 9 mL/min — AB (ref >60)
Glucose, Bld: 311 mg/dL — ABNORMAL HIGH (ref 65–99)
Glucose, Bld: 142 mg/dL — ABNORMAL HIGH (ref 65–99)
POTASSIUM: 5.4 mmol/L — AB (ref 3.5–5.1)
POTASSIUM: 4.4 mmol/L (ref 3.5–5.1)
SODIUM: 126 mmol/L — AB (ref 135–145)
Sodium: 130 mmol/L — ABNORMAL LOW (ref 135–145)

## 2017-03-13 LAB — PHOSPHORUS
Phosphorus: 7.7 mg/dL — ABNORMAL HIGH (ref 2.5–4.6)
Phosphorus: 6.3 mg/dL — ABNORMAL HIGH (ref 2.5–4.6)

## 2017-03-13 LAB — COMPREHENSIVE METABOLIC PANEL
ALK PHOS: 67 U/L (ref 38–126)
ALT: 5 U/L — AB (ref 17–63)
AST: 20 U/L (ref 15–41)
Albumin: 3.3 g/dL — ABNORMAL LOW (ref 3.5–5.0)
Anion gap: 15 (ref 5–15)
BILIRUBIN TOTAL: 0.7 mg/dL (ref 0.3–1.2)
BUN: 121 mg/dL — ABNORMAL HIGH (ref 6–20)
CALCIUM: 7.3 mg/dL — AB (ref 8.9–10.3)
CO2: 25 mmol/L (ref 22–32)
CREATININE: 4.67 mg/dL — AB (ref 0.61–1.24)
Chloride: 88 mmol/L — ABNORMAL LOW (ref 101–111)
GFR, EST AFRICAN AMERICAN: 11 mL/min — AB (ref >60)
GFR, EST NON AFRICAN AMERICAN: 10 mL/min — AB (ref >60)
Glucose, Bld: 231 mg/dL — ABNORMAL HIGH (ref 65–99)
Potassium: 4.8 mmol/L (ref 3.5–5.1)
Sodium: 128 mmol/L — ABNORMAL LOW (ref 135–145)
TOTAL PROTEIN: 6.5 g/dL (ref 6.5–8.1)

## 2017-03-13 LAB — GLUCOSE, CAPILLARY
GLUCOSE-CAPILLARY: 287 mg/dL — AB (ref 65–99)
GLUCOSE-CAPILLARY: 125 mg/dL — AB (ref 65–99)
Glucose-Capillary: 247 mg/dL — ABNORMAL HIGH (ref 65–99)
Glucose-Capillary: 301 mg/dL — ABNORMAL HIGH (ref 65–99)
Glucose-Capillary: 281 mg/dL — ABNORMAL HIGH (ref 65–99)
Glucose-Capillary: 194 mg/dL — ABNORMAL HIGH (ref 65–99)
Glucose-Capillary: 102 mg/dL — ABNORMAL HIGH (ref 65–99)

## 2017-03-13 LAB — MAGNESIUM: Magnesium: 2.3 mg/dL (ref 1.7–2.4)

## 2017-03-13 MED ORDER — METHYLPREDNISOLONE SODIUM SUCC 40 MG IJ SOLR
40.0000 mg | Freq: Once | INTRAMUSCULAR | Status: AC
  Administered 2017-03-13: 40 mg via INTRAVENOUS
  Filled 2017-03-13: qty 1

## 2017-03-13 MED ORDER — HEPARIN SODIUM (PORCINE) 1000 UNIT/ML IJ SOLN
1000.0000 [IU] | INTRAMUSCULAR | Status: DC | PRN
  Administered 2017-03-13: 1000 [IU] via INTRAVENOUS
  Filled 2017-03-13: qty 6
  Filled 2017-03-13: qty 1

## 2017-03-13 MED ORDER — HEPARIN SODIUM (PORCINE) 1000 UNIT/ML IJ SOLN: 1000.0000 [IU] | INTRAMUSCULAR | Status: DC | PRN

## 2017-03-13 MED ORDER — INSULIN GLARGINE 100 UNIT/ML ~~LOC~~ SOLN: 30.0000 [IU] | SUBCUTANEOUS | Status: DC

## 2017-03-13 MED ORDER — INSULIN GLARGINE 100 UNIT/ML ~~LOC~~ SOLN
30.0000 [IU] | SUBCUTANEOUS | Status: DC
  Filled 2017-03-13: qty 0.3

## 2017-03-13 MED ORDER — INSULIN ASPART 100 UNIT/ML ~~LOC~~ SOLN
14.0000 [IU] | Freq: Three times a day (TID) | SUBCUTANEOUS | Status: DC
  Administered 2017-03-13 – 2017-03-15 (×5): 14 [IU] via SUBCUTANEOUS
  Filled 2017-03-13 (×5): qty 1

## 2017-03-13 MED ORDER — INSULIN GLARGINE 100 UNIT/ML ~~LOC~~ SOLN
30.0000 [IU] | SUBCUTANEOUS | Status: DC
  Administered 2017-03-14 – 2017-03-15 (×2): 30 [IU] via SUBCUTANEOUS
  Filled 2017-03-13 (×2): qty 0.3

## 2017-03-13 NOTE — Progress Notes (Signed)
PT Cancellation Note  Patient Details Name: Dannielle BurnRobert S Douglass MRN: [id number] DOB: 11-19-1923   Cancelled Treatment:    Reason Eval/Treat Not Completed: Patient at procedure or test/unavailable Attempted X 3 this date.  Early pt had just gotten his breakfast, late afternoon he have having a dialysis cath placed, and then this afternoon he is starting HD.  Will try PT exam tomorrow.  Malachi ProGalen R Toddy Boyd, DPT  03/13/2017, 4:09 PM

## 2017-03-13 NOTE — Progress Notes (Signed)
[facility]  ROUNDING NOTE   Subjective:   UOP 1140  Creatinine 4.99 (5.04)  K 5.4 BUN 114 (104) Na 126  Sodium bicarb gtt  3 L [name]  Objective:  Vital signs in last 24 hours:  Temp:  [97.9 F (36.6 C)-98.2 F (36.8 C)] 98.1 F (36.7 C) (11/09 0733) Pulse Rate:  [60-93] 85 (11/09 0800) Resp:  [14-22] 15 (11/09 0900) BP: (84-138)/(44-74) 122/60 (11/09 0900) SpO2:  [89 %-100 %] 91 % (11/09 0835)  Weight change:  Filed Weights   03/09/17 1115 03/09/17 1914 03/10/17 1346  Weight: 103.9 kg (229 lb) 108.5 kg (239 lb 3.2 oz) 110.1 kg (242 lb 11.6 oz)    Intake/Output: I/O last 3 completed shifts: In: 3259.6 [P.O.:920; I.V.:2339.6] Out: 1390 [Urine:1390]   Intake/Output this shift:  Total I/O In: 25 [I.V.:25] Out: -   Physical Exam: General: Critically Ill  Head: Normocephalic, atraumatic. Moist oral mucosal membranes  Eyes: Anicteric, PERRL  Neck: Supple, trachea midline  Lungs:  wheezes  Heart: irregular  Abdomen:  Soft, nontender, obese   Extremities: trace peripheral edema.  Neurologic: Nonfocal, moving all four extremities  Skin: No lesions  Access: none  GU: foley    Basic Metabolic Panel: Recent Labs  Lab 03/10/17 0431 03/10/17 1354 03/10/17 1835 03/11/17 1026 03/12/17 1321 03/13/17 0336  NA 132*  --  128* 132* 123* 126*  K 4.0  --  4.8 4.2 5.2* 5.4*  CL 95*  --  92* 94* 87* 89*  CO2 23  --  22 23 18* 23  GLUCOSE 545*  --  429* 285* 448* 311*  BUN 42*  --  57* 81* 104* 114*  CREATININE 3.77* 5.06* 4.90* 5.23* 5.04* 4.99*  CALCIUM 8.2*  --  8.3* 8.2* 7.3* 7.3*  MG 2.3  --   --   --   --   --     Liver Function Tests: Recent Labs  Lab 03/11/17 1026  AST 35  ALT 8*  ALKPHOS 78  BILITOT 0.9  PROT 7.2  ALBUMIN 3.4*   No results for input(s): LIPASE, AMYLASE in the last 168 hours. No results for input(s): AMMONIA in the last 168 hours.  CBC: Recent Labs  Lab 03/09/17 1115 03/10/17 0431 03/11/17 1026  WBC 7.6 13.0*  24.1*  HGB 13.9 12.3* 13.0  HCT 41.9 37.9* 40.3  MCV 93.3 93.8 92.3  PLT 140* 152 183    Cardiac Enzymes: Recent Labs  Lab 03/09/17 1115  TROPONINI 0.03*    BNP: Invalid input(s): POCBNP  CBG: Recent Labs  Lab 03/12/17 1151 03/12/17 1726 03/12/17 2117 03/13/17 0228 03/13/17 0728  GLUCAP 416* 404* 318* 287* 247*    Microbiology: Results for orders placed or performed during the hospital encounter of 03/09/17  MRSA PCR Screening     Status: None   Collection Time: 03/10/17  1:43 PM  Result Value Ref Range Status   MRSA by PCR NEGATIVE NEGATIVE Final    Comment:        The GeneXpert MRSA Assay (FDA approved for NASAL specimens only), is one component of a comprehensive MRSA colonization surveillance program. It is not intended to diagnose MRSA infection nor to guide or monitor treatment for MRSA infections.     Coagulation Studies: No results for input(s): LABPROT, INR in the last 72 hours.  Urinalysis: No results for input(s): COLORURINE, LABSPEC, PHURINE, GLUCOSEU, HGBUR, BILIRUBINUR, KETONESUR, PROTEINUR, UROBILINOGEN, NITRITE, LEUKOCYTESUR in the last 72 hours.  Invalid input(s): APPERANCEUR  Imaging: No results found.   Medications:   . sodium chloride    . amiodarone Stopped (03/11/17 1004)   . amiodarone  200 mg Oral BID  . budesonide (PULMICORT) nebulizer solution  0.25 mg Nebulization BID  . carbidopa-levodopa  1 tablet Oral QID  . gabapentin  100 mg Oral TID  . guaiFENesin  600 mg Oral BID  . heparin subcutaneous  5,000 Units Subcutaneous Q8H  . insulin aspart  0-15 Units Subcutaneous TID WC  . insulin aspart  0-5 Units Subcutaneous QHS  . insulin aspart  12 Units Subcutaneous TID WC  . insulin glargine  28 Units Subcutaneous Q24H  . ipratropium-albuterol  3 mL Nebulization Q4H  . sodium chloride flush  3 mL Intravenous Q12H  . valACYclovir  1,000 mg Oral TID   sodium chloride, acetaminophen **OR** acetaminophen, albuterol,  amiodarone, dextrose, HYDROcodone-acetaminophen, ondansetron **OR** ondansetron (ZOFRAN) IV, sodium chloride flush  Assessment/ Plan:  Troy [name] is a 83 y.o. white male with hypertension, COPD, coronary artery disease status post CABG, congestive heart failure, atrial fibrillation, AICD placement, CVA, parkinson's, hypothyroidism, diabetes mellitus type II, who [facility]    1. Acute renal failure on chronic kidney disease stage III: baseline creatinine of 1.23, GFR of 49.  Nonoliguric urine output. Creatinine slowly improving. Volume status acceptable. However with hyperkalemia, uremia, metabolic acidosis on bicarb gtt, and hyponatremia  Acute renal failure secondary to acute cardiorenal syndrome.  Chronic kidney disease secondary to diabetes and hypertension - holding furosemide.  - holding lisinopril - Sodium bicarb gtt  - Discussed dialysis with patient and family. Patient is agreeable for a short temporary dialysis over the weekend.  - Discussed case with Dr. Belia HemanKasa  2. Hypertension: with acute exacerbation of congestive heart failure systolic and atrial fibrillation - amiodarone - Holding diuretics.   3. Acute respiratory failure: unclear how much is due to congestive heart failure.  Acute exacerbation of COPD.   - continue supportive care.  - 3 L [place]      LOS: [address] 11/9/20189:19 AM

## 2017-03-13 NOTE — Progress Notes (Signed)
Pre HD assessment  

## 2017-03-13 NOTE — Plan of Care (Signed)
Patient rested most of shift.  Refused BIPap.  Use [place] @ 3l.  No acute distress noted.

## 2017-03-13 NOTE — Progress Notes (Signed)
Post HD assessment  

## 2017-03-13 NOTE — Procedures (Signed)
Central Venous Dailysis Catheter Placement: DOUBLE LUMEN  °Indication: Hemo Dialysis/CRRT ° ° °Consent:emergent ° ° °Hand washing performed prior to starting the procedure.  ° °Procedure: An active timeout was performed and correct patient, name, & ID confirmed. Patient was positioned correctly for central venous access. Patient was prepped using strict sterile technique including chlorohexadine preps, sterile drape, sterile gown and sterile gloves.  The area was prepped, draped and anesthetized in the usual sterile manner. Patient comfort was obtained.   ° °A Double lumen catheter was placed in RT IJ Vein There was good blood return, catheter caps were placed on lumens, catheter flushed easily, the line was secured and a sterile dressing and BIO-PATCH applied.  ° °Ultrasound was used to visualize vasculature and guidance of needle.  ° °Number of Attempts: 1 °Complications:none ° °Estimated Blood Loss: none °Operator: Nohlan Burdin. ° ° °Jahmeek Shirk David Jibran Crookshanks, M.D.  °[facility]  °Medical Director ICU-[facility] °Medical Director [facility] Cardio-Pulmonary Department  ° ° ° °

## 2017-03-13 NOTE — Progress Notes (Signed)
Inpatient Diabetes Program Recommendations  AACE/ADA: New Consensus Statement on Inpatient Glycemic Control (2015)  Target Ranges:  Prepandial:   less than 140 mg/dL      Peak postprandial:   less than 180 mg/dL (1-2 hours)      Critically ill patients:  140 - 180 mg/dL   Results for Troy [name], Troy [name] (MRN [id number]) as of 03/13/2017 11:54  Ref. Range 03/12/2017 07:51 03/12/2017 11:51 03/12/2017 17:26 03/12/2017 21:17  Glucose-Capillary Latest Ref Range: 65 - 99 mg/dL 829343 (H) 562416 (H) 130404 (H) 318 (H)   Results for Troy [name], Troy [name] (MRN [id number]) as of 03/13/2017 11:54  Ref. Range 03/13/2017 07:28 03/13/2017 10:00  Glucose-Capillary Latest Ref Range: 65 - 99 mg/dL 295247 (H) 284301 (H)    Home DM Meds: Lantus 20 units QHS       Regular 30 units Breakfast/ 10 units Lunch/ 25 units Dinner       Glipizide 10 mg daily       Onglyza 5 mg daily  Current Insulin Orders: Lantus 28 units daily      Novolog Moderate Correction Scale/ SSI (0-15 units) TID AC + HS      Novolog 12 units TID     MD- Note last dose Solumedrol given yesterday (11/08) at 10am.  No more steroids ordered at present.  Note that Lantus increased and Novolog 12 units TID of meal coverage started last night at dinner.  CBGs still elevated today.  Please consider the following adjustments:  1. Increase Lantus to 30 units daily  2. Increase Novolog Meal Coverage to: Novolog 14 units TID with meals (hold if pt eats <50% of meal)     --Will follow patient during hospitalization--  Ambrose FinlandJeannine Johnston Laith Antonelli RN, MSN, CDE Diabetes Coordinator Inpatient Glycemic Control Team Team Pager: [phone] (8a-5p)

## 2017-03-13 NOTE — Progress Notes (Signed)
HD tx start time 

## 2017-03-13 NOTE — Progress Notes (Signed)
Report received at 2:20

## 2017-03-13 NOTE — Progress Notes (Signed)
RN called and spoke with Sonda Rumbleana Blakeney, NP and made her aware that patient has been having frequent short runs of vtach 6-8 beats at a time that started during dialysis and that dialysis just finished.  NP acknowledged and stated she would check patient's electrolytes.

## 2017-03-13 NOTE — Progress Notes (Signed)
HD tx end  

## 2017-03-13 NOTE — Progress Notes (Signed)
[employer] - [place] at [facility]   PATIENT NAME: Troy [name]    MR#:  [id number]  DATE OF BIRTH:  1923/10/11  SUBJECTIVE:  CHIEF COMPLAINT:   Chief Complaint  Patient presents with  . Shortness of Breath  . Weakness   Sitting in a chair. Off pressors.  On amiodarone drip. Breathing better.   REVIEW OF SYSTEMS:    Review of Systems  Constitutional: Positive for malaise/fatigue. Negative for chills and fever.  HENT: Negative for sore throat.   Eyes: Negative for blurred vision, double vision and pain.  Respiratory: Positive for cough, shortness of breath and wheezing. Negative for hemoptysis.   Cardiovascular: Negative for chest pain, palpitations, orthopnea and leg swelling.  Gastrointestinal: Negative for abdominal pain, constipation, diarrhea, heartburn, nausea and vomiting.  Genitourinary: Negative for dysuria and hematuria.  Musculoskeletal: Negative for back pain and joint pain.  Skin: Negative for rash.  Neurological: Positive for weakness. Negative for sensory change, speech change, focal weakness and headaches.  Endo/Heme/Allergies: Does not bruise/bleed easily.  Psychiatric/Behavioral: Negative for depression. The patient is not nervous/anxious.     DRUG ALLERGIES:  No Known Allergies  VITALS:  Blood pressure 96/60, pulse 86, temperature 98 F (36.7 C), temperature source Axillary, resp. rate 15, height 5' 9.5" (1.765 m), weight 110.1 kg (242 lb 11.6 oz), SpO2 98 %.  PHYSICAL EXAMINATION:   Physical Exam  GENERAL:  83 y.o.-year-old patient lying in the bed .  Obese.  In respiratory distress. EYES: . No scleral icterus. Extraocular muscles intact.  HEENT: Head atraumatic, normocephalic. Oropharynx and nasopharynx clear.  NECK:  Supple, no jugular venous distention. No thyroid enlargement, no tenderness.  LUNGS:  bilateral wheezing and crackles.  Decreased air entry. CARDIOVASCULAR: S1, S2 tachycardiac. ABDOMEN: Soft, nontender,  nondistended. Bowel sounds present. No organomegaly or mass.  EXTREMITIES: No cyanosis, clubbing or edema b/l.    NEUROLOGIC: Cranial nerves II through XII are intact. No focal Motor or sensory deficits b/l.   PSYCHIATRIC: The patient is alert and awake SKIN: No obvious rash, lesion, or ulcer.   LABORATORY PANEL:   CBC Recent Labs  Lab 03/11/17 1026  WBC 24.1*  HGB 13.0  HCT 40.3  PLT 183   ------------------------------------------------------------------------------------------------------------------ Chemistries  Recent Labs  Lab 03/10/17 0431  03/11/17 1026  03/13/17 0336  NA 132*   < > 132*   < > 126*  K 4.0   < > 4.2   < > 5.4*  CL 95*   < > 94*   < > 89*  CO2 23   < > 23   < > 23  GLUCOSE 545*   < > 285*   < > 311*  BUN 42*   < > 81*   < > 114*  CREATININE 3.77*   < > 5.23*   < > 4.99*  CALCIUM 8.2*   < > 8.2*   < > 7.3*  MG 2.3  --   --   --   --   AST  --   --  35  --   --   ALT  --   --  8*  --   --   ALKPHOS  --   --  78  --   --   BILITOT  --   --  0.9  --   --    < > = values in this interval not displayed.   ------------------------------------------------------------------------------------------------------------------  Cardiac Enzymes Recent Labs  Lab 03/09/17 1115  TROPONINI 0.03*   ------------------------------------------------------------------------------------------------------------------  RADIOLOGY:  Dg Chest Port 1 View  Result Date: 03/13/2017 CLINICAL DATA:  Status post central line flow EXAM: PORTABLE CHEST 1 VIEW COMPARISON:  03/10/2017 FINDINGS: Right jugular central line is noted with catheter tip in the superior aspect of the right atrium. No pneumothorax is noted. The lungs are well aerated with mild vascular congestion. No focal confluent infiltrate is seen. Cardiomegaly remains. Pacing device is again seen and stable. IMPRESSION: Status post central line placement without evidence of pneumothorax. Electronically Signed   By:  Alcide CleverMark  Lukens M.D.   On: 03/13/2017 11:59     ASSESSMENT AND PLAN:   *Acute on chronic systolic congestive heart failure with acute on chronic respiratory failure Patient has poor urine output.  Troy [name] is limiting diuresis.   HD cathetr to be placed today for HD  * Acute COPD exacerbation  - Off steroids - Scheduled Nebulizers - Inhalers -Wean O2 as tolerated -Appreciate pulmonary help  * Acute kidney injury over CKD stage III due to cardiorenal syndrome.   HD to be started today.  * Atrial fibrillation with rapid ventricular rate. Amiodarone drip changed to PO  * Insulin-dependent diabetes mellitus with severe hyperglycemia.   Was on insulin drip and stopped now  * DVT prophylaxis.   Subcutaneous heparin  All the records are reviewed and case discussed with Care Management/Social Worker Management plans discussed with the patient, family and they are in agreement.  CODE STATUS: DNR  DVT Prophylaxis: SCDs  TOTAL TIME TAKING CARE OF THIS PATIENT: 35 minutes.   Milagros LollSudini, Troy [name] R M.D on 03/13/2017 at 12:52 PM  Between 7am to 6pm - Pager - [phone]  After 6pm go to [email or web address] - password [email or web address] [facility]  [facility] [place] [facility]  Office  [phone]  CC: Primary care physician; Barbette ReichmannHande, Vishwanath, MD  Note: This dictation was prepared with Dragon dictation along with smaller phrase technology. Any transcriptional errors that result from this process are unintentional.

## 2017-03-13 NOTE — Progress Notes (Signed)
Patient awake and alert. Family at bedside until 10 pm.  Patient able to make needs known. Refused bipap.  Feels more comfortable with nasal cannula.  O2 to 3L d/t saturations being <89%.  O2 at 3L. Rhonchi noted in bilateral lungs.   Able to take meds without difficulty.  Low urine output.  No c/o pain or discomfort.

## 2017-03-14 LAB — CBC
HEMATOCRIT: 38.6 % — AB (ref 40.0–52.0)
Hemoglobin: 12.5 g/dL — ABNORMAL LOW (ref 13.0–18.0)
MCH: 29.7 pg (ref 26.0–34.0)
MCHC: 32.5 g/dL (ref 32.0–36.0)
MCV: 91.4 fL (ref 80.0–100.0)
PLATELETS: 152 10*3/uL (ref 150–440)
RBC: 4.22 MIL/uL — AB (ref 4.40–5.90)
RDW: 14.6 % — ABNORMAL HIGH (ref 11.5–14.5)
WBC: 11.1 10*3/uL — AB (ref 3.8–10.6)

## 2017-03-14 LAB — GLUCOSE, CAPILLARY
GLUCOSE-CAPILLARY: 258 mg/dL — AB (ref 65–99)
GLUCOSE-CAPILLARY: 84 mg/dL (ref 65–99)
Glucose-Capillary: 285 mg/dL — ABNORMAL HIGH (ref 65–99)
Glucose-Capillary: 209 mg/dL — ABNORMAL HIGH (ref 65–99)

## 2017-03-14 LAB — HEPATITIS B SURFACE ANTIBODY,QUALITATIVE: HEP B S AB: NONREACTIVE

## 2017-03-14 LAB — BASIC METABOLIC PANEL
ANION GAP: 12 (ref 5–15)
ANION GAP: 11 (ref 5–15)
BUN: 96 mg/dL — ABNORMAL HIGH (ref 6–20)
BUN: 51 mg/dL — ABNORMAL HIGH (ref 6–20)
CO2: 24 mmol/L (ref 22–32)
CO2: 28 mmol/L (ref 22–32)
Calcium: 7.7 mg/dL — ABNORMAL LOW (ref 8.9–10.3)
Calcium: 8.2 mg/dL — ABNORMAL LOW (ref 8.9–10.3)
Chloride: 94 mmol/L — ABNORMAL LOW (ref 101–111)
Chloride: 97 mmol/L — ABNORMAL LOW (ref 101–111)
Creatinine, Ser: 3.38 mg/dL — ABNORMAL HIGH (ref 0.61–1.24)
Creatinine, Ser: 1.66 mg/dL — ABNORMAL HIGH (ref 0.61–1.24)
GFR, EST AFRICAN AMERICAN: 17 mL/min — AB (ref >60)
GFR, EST AFRICAN AMERICAN: 40 mL/min — AB (ref >60)
GFR, EST NON AFRICAN AMERICAN: 15 mL/min — AB (ref >60)
GFR, EST NON AFRICAN AMERICAN: 34 mL/min — AB (ref >60)
GLUCOSE: 263 mg/dL — AB (ref 65–99)
GLUCOSE: 92 mg/dL (ref 65–99)
POTASSIUM: 5.8 mmol/L — AB (ref 3.5–5.1)
POTASSIUM: 4.9 mmol/L (ref 3.5–5.1)
Sodium: 130 mmol/L — ABNORMAL LOW (ref 135–145)
Sodium: 136 mmol/L (ref 135–145)

## 2017-03-14 LAB — HEPATITIS B CORE ANTIBODY, TOTAL: Hep B Core Total Ab: NEGATIVE

## 2017-03-14 LAB — PHOSPHORUS
PHOSPHORUS: 6.9 mg/dL — AB (ref 2.5–4.6)
Phosphorus: 3.7 mg/dL (ref 2.5–4.6)

## 2017-03-14 LAB — HEPATITIS B SURFACE ANTIGEN: HEP B S AG: NEGATIVE

## 2017-03-14 LAB — MAGNESIUM
Magnesium: 2.4 mg/dL (ref 1.7–2.4)
Magnesium: 2.2 mg/dL (ref 1.7–2.4)

## 2017-03-14 LAB — PARATHYROID HORMONE, INTACT (NO CA): PTH: 377 pg/mL — ABNORMAL HIGH (ref 15–65)

## 2017-03-14 MED ORDER — FAMOTIDINE 20 MG PO TABS
40.0000 mg | ORAL_TABLET | Freq: Every day | ORAL | Status: DC
  Administered 2017-03-14 – 2017-03-18 (×5): 40 mg via ORAL
  Filled 2017-03-14 (×5): qty 2

## 2017-03-14 MED ORDER — CALCIUM CARBONATE ANTACID 500 MG PO CHEW
1.0000 | CHEWABLE_TABLET | Freq: Two times a day (BID) | ORAL | Status: DC
  Administered 2017-03-14 – 2017-03-18 (×9): 200 mg via ORAL
  Filled 2017-03-14 (×9): qty 1

## 2017-03-14 NOTE — Progress Notes (Signed)
Patient slept all of shift with BiPap on.  Tolerated well.  Runs of Vtach noted.  No distress noted.  Patient continues to have bilateral 2+ edema to upper and lower extremities.  Urine output is ~ 1000 mls during this shift.  Will continue to monitor.

## 2017-03-14 NOTE — Plan of Care (Signed)
  Education: Knowledge of General Education information will improve 03/14/2017 0305 - Progressing by Prentice DockerSilva, Jakira Mcfadden M, RN   Health Behavior/Discharge Planning: Ability to manage health-related needs will improve 03/14/2017 0305 - Progressing by Prentice DockerSilva, Kyuss Hale M, RN   Progressing Education: Knowledge of General Education information will improve 03/14/2017 0305 - Progressing by Prentice DockerSilva, Ziere Docken M, RN Health Behavior/Discharge Planning: Ability to manage health-related needs will improve 03/14/2017 0305 - Progressing by Prentice DockerSilva, Marcell Chavarin M, RN Clinical Measurements: Ability to maintain clinical measurements within normal limits will improve 03/14/2017 0305 - Progressing by Prentice DockerSilva, Samone Guhl M, RN Will remain free from infection 03/14/2017 0305 - Progressing by Prentice DockerSilva, Alix Lahmann M, RN Diagnostic test results will improve 03/14/2017 0305 - Progressing by Prentice DockerSilva, Maresa Morash M, RN Respiratory complications will improve 03/14/2017 0305 - Progressing by Prentice DockerSilva, Merisa Julio M, RN Cardiovascular complication will be avoided 03/14/2017 0305 - Progressing by Prentice DockerSilva, Kelia Gibbon M, RN Activity: Risk for activity intolerance will decrease 03/14/2017 0305 - Progressing by Prentice DockerSilva, Harpreet Signore M, RN Nutrition: Adequate nutrition will be maintained 03/14/2017 0305 - Progressing by Prentice DockerSilva, Tinzlee Craker M, RN Coping: Level of anxiety will decrease 03/14/2017 0305 - Progressing by Prentice DockerSilva, Vivian Okelley M, RN Elimination: Will not experience complications related to bowel motility 03/14/2017 0305 - Progressing by Prentice DockerSilva, Lasheika Ortloff M, RN Will not experience complications related to urinary retention 03/14/2017 0305 - Progressing by Prentice DockerSilva, Kewanda Poland M, RN Pain Managment: General experience of comfort will improve 03/14/2017 0305 - Progressing by Prentice DockerSilva, Kyliana Standen M, RN Safety: Ability to remain free from injury will improve 03/14/2017 0305 - Progressing by Prentice DockerSilva, Albertia Carvin M, RN Skin Integrity: Risk for impaired skin integrity will decrease 03/14/2017 0305 - Progressing by  Prentice DockerSilva, Almas Rake M, RN Activity: Will verbalize the importance of balancing activity with adequate rest periods 03/14/2017 0305 - Progressing by Prentice DockerSilva, Mirjana Tarleton M, RN Education: Knowledge of the prescribed therapeutic regimen will improve 03/14/2017 0305 - Progressing by Prentice DockerSilva, Regnia Mathwig M, RN Coping: Ability to identify and develop effective coping behavior will improve 03/14/2017 0305 - Progressing by Prentice DockerSilva, Vedanshi Massaro M, RN Level of anxiety will decrease 03/14/2017 0305 - Progressing by Prentice DockerSilva, Lauralynn Loeb M, RN Health Behavior/Discharge Planning: Ability to manage health-related needs will improve 03/14/2017 0305 - Progressing by Prentice DockerSilva, Tobe Kervin M, RN Nutritional: Maintenance of adequate nutrition will improve 03/14/2017 0305 - Progressing by Prentice DockerSilva, Nature Vogelsang M, RN Respiratory: Ability to maintain a clear airway will improve 03/14/2017 0305 - Progressing by Prentice DockerSilva, Dechelle Attaway M, RN Levels of oxygenation will improve 03/14/2017 0305 - Progressing by Prentice DockerSilva, Geniya Fulgham M, RN Ability to maintain adequate ventilation will improve 03/14/2017 0305 - Progressing by Prentice DockerSilva, Zekiah Coen M, RN Complications related to the disease process, condition or treatment will be avoided or minimized 03/14/2017 0305 - Progressing by Prentice DockerSilva, Miniya Miguez M, RN Pain Management: Expressions of feelings of enhanced comfort will increase 03/14/2017 0305 - Progressing by Prentice DockerSilva, Juancarlos Crescenzo M, RN

## 2017-03-14 NOTE — Progress Notes (Signed)
Patient resting in bed this shift.  Patient agreed to wear BiPap this evening.  No acute distress noted. Patient continues with intermittent runs of Vtach.  NP aware. No new orders at this time.

## 2017-03-14 NOTE — Progress Notes (Signed)
[facility]  ROUNDING NOTE   Subjective:   First dialysis yesterday. Tolerated treatment well. UF of 0. UOP 1715  K 5.8  Wife at bedside.   Patient continues to complain of shortness of breath.   Objective:  Vital signs in last 24 hours:  Temp:  [97.9 F (36.6 C)-98.8 F (37.1 C)] 98.8 F (37.1 C) (11/10 0200) Pulse Rate:  [63-101] 78 (11/10 0743) Resp:  [12-20] 15 (11/10 0600) BP: (90-135)/(48-77) 130/70 (11/10 0600) SpO2:  [92 %-100 %] 98 % (11/10 0743) FiO2 (%):  [35 %] 35 % (11/10 0743) Weight:  [117.7 kg (259 lb 7.7 oz)-117.9 kg (259 lb 14.8 oz)] 117.7 kg (259 lb 7.7 oz) (11/09 1728)  Weight change:  Filed Weights   03/10/17 1346 03/13/17 1525 03/13/17 1728  Weight: 110.1 kg (242 lb 11.6 oz) 117.9 kg (259 lb 14.8 oz) 117.7 kg (259 lb 7.7 oz)    Intake/Output: I/O last 3 completed shifts: In: 915 [P.O.:240; I.V.:675] Out: 2202 [Urine:2175; Other:27]   Intake/Output this shift:  No intake/output data recorded.  Physical Exam: General: Critically Ill  Head: Normocephalic, atraumatic. Moist oral mucosal membranes  Eyes: Anicteric, PERRL  Neck: Supple, trachea midline  Lungs:  wheezes  Heart: irregular  Abdomen:  Soft, nontender, obese   Extremities: + peripheral edema.  Neurologic: Nonfocal, moving all four extremities  Skin: No lesions  Access: RIJ permcath cath Dr. Belia HemanKasa 11/9  GU: foley    Basic Metabolic Panel: Recent Labs  Lab 03/10/17 0431  03/12/17 1321 03/13/17 0336 03/13/17 1335 03/13/17 1924 03/14/17 0525  NA 132*   < > 123* 126* 128* 130* 130*  K 4.0   < > 5.2* 5.4* 4.8 4.4 5.8*  CL 95*   < > 87* 89* 88* 92* 94*  CO2 23   < > 18* 23 25 25 24   GLUCOSE 545*   < > 448* 311* 231* 142* 263*  BUN 42*   < > 104* 114* 121* 97* 96*  CREATININE 3.77*   < > 5.04* 4.99* 4.67* 3.82* 3.38*  CALCIUM 8.2*   < > 7.3* 7.3* 7.3* 7.4* 7.7*  MG 2.3  --   --   --   --  2.3 2.4  PHOS  --   --   --   --  7.7* 6.3* 6.9*   < > = values in this  interval not displayed.    Liver Function Tests: Recent Labs  Lab 03/11/17 1026 03/13/17 1335  AST 35 20  ALT 8* 5*  ALKPHOS 78 67  BILITOT 0.9 0.7  PROT 7.2 6.5  ALBUMIN 3.4* 3.3*   No results for input(s): LIPASE, AMYLASE in the last 168 hours. No results for input(s): AMMONIA in the last 168 hours.  CBC: Recent Labs  Lab 03/09/17 1115 03/10/17 0431 03/11/17 1026 03/13/17 1335 03/14/17 0525  WBC 7.6 13.0* 24.1* 13.1* 11.1*  HGB 13.9 12.3* 13.0 12.1* 12.5*  HCT 41.9 37.9* 40.3 36.7* 38.6*  MCV 93.3 93.8 92.3 91.0 91.4  PLT 140* 152 183 165 152    Cardiac Enzymes: Recent Labs  Lab 03/09/17 1115  TROPONINI 0.03*    BNP: Invalid input(s): POCBNP  CBG: Recent Labs  Lab 03/13/17 1159 03/13/17 1602 03/13/17 1808 03/13/17 2153 03/14/17 0755  GLUCAP 281* 194* 125* 102* 258*    Microbiology: Results for orders placed or performed during the hospital encounter of 03/09/17  MRSA PCR Screening     Status: None   Collection Time: 03/10/17  1:43 PM  Result Value Ref Range Status   MRSA by PCR NEGATIVE NEGATIVE Final    Comment:        The GeneXpert MRSA Assay (FDA approved for NASAL specimens only), is one component of a comprehensive MRSA colonization surveillance program. It is not intended to diagnose MRSA infection nor to guide or monitor treatment for MRSA infections.     Coagulation Studies: No results for input(s): LABPROT, INR in the last 72 hours.  Urinalysis: No results for input(s): COLORURINE, LABSPEC, PHURINE, GLUCOSEU, HGBUR, BILIRUBINUR, KETONESUR, PROTEINUR, UROBILINOGEN, NITRITE, LEUKOCYTESUR in the last 72 hours.  Invalid input(s): APPERANCEUR    Imaging: Dg Chest Port 1 View  Result Date: 03/13/2017 CLINICAL DATA:  Status post central line flow EXAM: PORTABLE CHEST 1 VIEW COMPARISON:  03/10/2017 FINDINGS: Right jugular central line is noted with catheter tip in the superior aspect of the right atrium. No pneumothorax is noted.  The lungs are well aerated with mild vascular congestion. No focal confluent infiltrate is seen. Cardiomegaly remains. Pacing device is again seen and stable. IMPRESSION: Status post central line placement without evidence of pneumothorax. Electronically Signed   By: Alcide CleverMark  Lukens M.D.   On: 03/13/2017 11:59     Medications:   . sodium chloride    . amiodarone Stopped (03/11/17 1004)   . amiodarone  200 mg Oral BID  . budesonide (PULMICORT) nebulizer solution  0.25 mg Nebulization BID  . calcium carbonate  1 tablet Oral BID  . carbidopa-levodopa  1 tablet Oral QID  . famotidine  40 mg Oral Daily  . gabapentin  100 mg Oral TID  . guaiFENesin  600 mg Oral BID  . heparin subcutaneous  5,000 Units Subcutaneous Q8H  . insulin aspart  0-15 Units Subcutaneous TID WC  . insulin aspart  0-5 Units Subcutaneous QHS  . insulin aspart  14 Units Subcutaneous TID WC  . insulin glargine  30 Units Subcutaneous Q24H  . ipratropium-albuterol  3 mL Nebulization Q4H  . sodium chloride flush  3 mL Intravenous Q12H  . valACYclovir  1,000 mg Oral TID   sodium chloride, acetaminophen **OR** acetaminophen, albuterol, amiodarone, dextrose, heparin, HYDROcodone-acetaminophen, ondansetron **OR** ondansetron (ZOFRAN) IV, sodium chloride flush  Assessment/ Plan:  Mr. Dannielle BurnRobert S Luepke is a 83 y.o. white male with hypertension, COPD, coronary artery disease status post CABG, congestive heart failure, atrial fibrillation, AICD placement, CVA, parkinson's, hypothyroidism, diabetes mellitus type II, who [facility]    1. Acute renal failure on chronic kidney disease stage III: baseline creatinine of 1.23, GFR of 49.  Nonoliguric urine output. Creatinine slowly improving. Volume status acceptable. However with hyperkalemia, uremia, metabolic acidosis on bicarb gtt, and hyponatremia  Acute renal failure secondary to acute cardiorenal syndrome.  Chronic kidney disease secondary to diabetes and  hypertension - Hemodialysis today. Second treatment. Due to hyperkalemia.  Monitor daily for dialysis need.   2. Hypertension: with acute exacerbation of congestive heart failure systolic and atrial fibrillation - amiodarone - Holding diuretics.   3. Acute respiratory failure: unclear how much is due to congestive heart failure.  Acute exacerbation of COPD.   - continue supportive care.  - 3 L [place]      LOS: 4 Tyse Auriemma 11/10/20189:54 AM

## 2017-03-14 NOTE — Progress Notes (Signed)
Post HD assessment  

## 2017-03-14 NOTE — Progress Notes (Signed)
HD tx start 

## 2017-03-14 NOTE — Progress Notes (Signed)
HD tx end  

## 2017-03-14 NOTE — Progress Notes (Signed)
   CHIEF COMPLAINT:   Chief Complaint  Patient presents with  . Shortness of Breath  . Weakness    Subjective  Follow up cardio-renal syndrome Patient on biPAP Started HD yesterday Follow up      Objective   Examination:  General exam: Appears calm and comfortable on biPAP Respiratory system: Clear to auscultation. Respiratory effort normal. HEENT: [place]/AT, PERRLA, no thrush, no stridor. Cardiovascular system: S1 & S2 heard, RRR. No JVD, murmurs, rubs, gallops or clicks. No pedal edema. Gastrointestinal system: Abdomen is nondistended, soft and nontender. No organomegaly or masses felt. Normal bowel sounds heard. Central nervous system: Alert and oriented. No focal neurological deficits. Extremities: Symmetric 5 x 5 power. Skin: No rashes, lesions or ulcers Psychiatry: Judgement and insight poor.   VITALS:  height is 5' 9.5" (1.765 m) and weight is 259 lb 7.7 oz (117.7 kg). His axillary temperature is 98.8 F (37.1 C). His blood pressure is 130/70 and his pulse is 78. His respiration is 15 and oxygen saturation is 98%.   I personally reviewed Labs under Results section.  Radiology Reports Dg Chest Port 1 View  Result Date: 03/13/2017 CLINICAL DATA:  Status post central line flow EXAM: PORTABLE CHEST 1 VIEW COMPARISON:  03/10/2017 FINDINGS: Right jugular central line is noted with catheter tip in the superior aspect of the right atrium. No pneumothorax is noted. The lungs are well aerated with mild vascular congestion. No focal confluent infiltrate is seen. Cardiomegaly remains. Pacing device is again seen and stable. IMPRESSION: Status post central line placement without evidence of pneumothorax. Electronically Signed   By: Alcide CleverMark  Lukens M.D.   On: 03/13/2017 11:59   I have Independently reviewed images of  CXR  on 03/14/2017 Interpretation: b/l opacities vasc cath in place     Assessment/Plan:  83 yo obese white male with progressive and worsening cardio-renal  syndrome Patient started on HD  *Acute on chronic systolic congestive heart failure with acute on chronic respiratory failure Patient has poor urine output.  HD started  * Acute COPD exacerbation  - steroids if needed - Scheduled Nebulizers - Inhalers -Wean O2 as tolerated  * Acute kidney injury over CKD stage III due to cardiorenal syndrome.   HD to be started today.  * Atrial fibrillation with rapid ventricular rate. Amiodarone drip changed to PO  * DVT prophylaxis.   Subcutaneous heparin   CODE STATUS: DNR   Lucie LeatherKurian David Iqra Rotundo, M.D.  [facility]  Medical Director [facility] Medical Director [facility] Cardio[facility] Department

## 2017-03-14 NOTE — Progress Notes (Signed)
Pre HD assessment  

## 2017-03-14 NOTE — Evaluation (Signed)
Physical Therapy Evaluation Patient Details Name: Troy [name] MRN: [id number] DOB: 01/05/1924 Today's Date: 03/14/2017   History of Present Illness  [name] is a 83 y.o. white male with hypertension, COPD, coronary artery disease status post CABG, congestive heart failure, atrial fibrillation, AICD placement, CVA, parkinson's, hypothyroidism, diabetes mellitus type II, who was [facility]/09/2017      Clinical Impression  Pt presents to PT with generalized LE weakness, decreased functional mobility, and decreased gait and would benefit from acute PT services to address objective findings.  Pt has support at home from care giver, however pt has lost functional strength since admission and requires a higher level of care.  Pt able to stand and move laterally along bed, but knees are weak and give out easily after a short period of time.  Pt demonstrated good motivation with activity and expect pt to progress well with PT.    Follow Up Recommendations SNF    Equipment Recommendations  None recommended by PT    Recommendations for Other Services       Precautions / Restrictions Precautions Precautions: Fall Restrictions Weight Bearing Restrictions: No      Mobility  Bed Mobility Overal bed mobility: Needs Assistance Bed Mobility: Supine to Sit;Sit to Supine     Supine to sit: Min assist;Mod assist Sit to supine: Mod assist   General bed mobility comments: HOB elevated and using bed rails heavily to pull upper body and rotate hips to EOB; assist to elevate B LE's back onto bed.  Transfers Overall transfer level: Needs assistance Equipment used: (Backrest of chair) Transfers: Sit to/from Stand Sit to Stand: Mod assist         General transfer comment: Sit<>stand x 2 reps.  Mod A with first trial and Min A for second trial, able to initiate standing with good forward lean then needed boost to extend hip and to maintain inital  balance.  Ambulation/Gait Ambulation/Gait assistance: Min assist Ambulation Distance (Feet): 2 Feet Assistive device: None(Backrest of chair) Gait Pattern/deviations: (lateral steps)     General Gait Details: Marching and lateral steps next to bed with decreased control of knees, fatigues quickly and returns to sitting with decreased eccentic control.  Stairs   Wheelchair Mobility    Modified Rankin (Stroke Patients Only)       Balance Overall balance assessment: Needs assistance Sitting-balance support: Bilateral upper extremity supported;Feet supported Sitting balance-Leahy Scale: Good   Postural control: Right lateral lean Standing balance support: Bilateral upper extremity supported;During functional activity Standing balance-Leahy Scale: Fair Standing balance comment: Min A to maintain standing balance, knees  buckling          Pertinent Vitals/Pain Pain Assessment: No/denies pain    Home Living Family/patient expects to be discharged to:: Private residence Living Arrangements: Spouse/significant other Available Help at Discharge: Family;Personal care attendant(5x/week, 8 hours/day) Type of Home: House Home Access: Stairs to enter Entrance Stairs-Rails: Right;Left;Can reach both Entrance Stairs-Number of Steps: 4 Home Layout: One level Home Equipment: Walker - 2 wheels;Cane - single point;Wheelchair - manual      Prior Function Level of Independence: Needs assistance   Gait / Transfers Assistance Needed: amb with cane inside home, sometimes uses w/c  ADL's / Homemaking Assistance Needed: Aide 5x/week for ADL's        Hand Dominance        Extremity/Trunk Assessment   Upper Extremity Assessment Upper Extremity Assessment: Generalized weakness    Lower Extremity Assessment Lower Extremity  Assessment: Generalized weakness       Communication   Communication: No difficulties  Cognition Arousal/Alertness: Awake/alert Behavior During Therapy:  WFL for tasks assessed/performed Overall Cognitive Status: Within Functional Limits for tasks assessed          General Comments General comments (skin integrity, edema, etc.): visible areas c/d/i; all lines/lead in place    Exercises General Exercises - Lower Extremity Ankle Circles/Pumps: AROM;Both;10 reps;Supine Quad Sets: AROM;Strengthening;Both;10 reps;Supine Heel Slides: AROM;Strengthening;Both;10 reps;Supine Straight Leg Raises: AROM;Strengthening;Both;10 reps;Supine Hip Flexion/Marching: AROM;Strengthening;Both;10 reps;Standing   Assessment/Plan    PT Assessment Patient needs continued PT services  PT Problem List Decreased strength;Decreased activity tolerance;Decreased balance;Decreased mobility;Cardiopulmonary status limiting activity       PT Treatment Interventions DME instruction;Gait training;Stair training;Functional mobility training;Therapeutic activities;Therapeutic exercise;Balance training    PT Goals (Current goals can be found in the Care Plan section)  Acute Rehab PT Goals Patient Stated Goal: To go home. PT Goal Formulation: With patient Time For Goal Achievement: 03/28/17 Potential to Achieve Goals: Good    Frequency Min 2X/week   Barriers to discharge Inaccessible home environment      Co-evaluation      AM-PAC PT "6 Clicks" Daily Activity  Outcome Measure Difficulty turning over in bed (including adjusting bedclothes, sheets and blankets)?: A Lot Difficulty moving from lying on back to sitting on the side of the bed? : Unable Difficulty sitting down on and standing up from a chair with arms (e.g., wheelchair, bedside commode, etc,.)?: Unable Help needed moving to and from a bed to chair (including a wheelchair)?: A Lot Help needed walking in hospital room?: A Lot Help needed climbing 3-5 steps with a railing? : A Lot 6 Click Score: 10    End of Session Equipment Utilized During Treatment: Gait belt Activity Tolerance: Patient tolerated  treatment well Patient left: in bed;with call bell/phone within reach;with nursing/sitter in room Nurse Communication: Mobility status PT Visit Diagnosis: Unsteadiness on feet (R26.81);Muscle weakness (generalized) (M62.81)    Time: [id number] PT Time Calculation (min) (ACUTE ONLY): 23 min   Charges:   PT Evaluation $PT Eval Moderate Complexity: 1 Mod PT Treatments $Therapeutic Exercise: 8-22 mins   PT G Codes:   PT G-Codes **NOT FOR INPATIENT CLASS** Functional Assessment Tool Used: AM-PAC 6 Clicks Basic Mobility Functional Limitation: Mobility: Walking and moving around Mobility: Walking and Moving Around Current Status ([id number](G8978): At least 60 percent but less than 80 percent impaired, limited or restricted Mobility: Walking and Moving Around Goal Status [phone](G8979): At least 40 percent but less than 60 percent impaired, limited or restricted    Pulte Homesisele A Roshon Duell, PT 03/14/2017, 5:39 PM

## 2017-03-14 NOTE — Progress Notes (Signed)
Patient ID: Troy [name], male   DOB: 02/21/1924, 83 y.o.   MRN: [id number]  [employer] PROGRESS NOTE  Troy [name] WJX:[id number] DOB: 02/21/1924 DOA: 03/09/2017 PCP: Troy [name]  HPI/Subjective: Patient with shortness of breath and cough.  His hands are swollen but less than yesterday.  Objective: Vitals:   03/14/17 1000 03/14/17 1224  BP: 130/70   Pulse: 88   Resp: 20   Temp:    SpO2: 95% 97%    Filed Weights   03/10/17 1346 03/13/17 1525 03/13/17 1728  Weight: 110.1 kg (242 lb 11.6 oz) 117.9 kg (259 lb 14.8 oz) 117.7 kg (259 lb 7.7 oz)    ROS: Review of Systems  Constitutional: Negative for chills and fever.  Eyes: Negative for blurred vision.  Respiratory: Positive for cough and shortness of breath.   Cardiovascular: Negative for chest pain.  Gastrointestinal: Negative for abdominal pain, constipation, diarrhea, nausea and vomiting.  Genitourinary: Negative for dysuria.  Musculoskeletal: Negative for joint pain.  Neurological: Negative for dizziness and headaches.   Exam: Physical Exam  Constitutional: He is oriented to person, place, and time.  HENT:  Nose: No mucosal edema.  Mouth/Throat: No oropharyngeal exudate or posterior oropharyngeal edema.  Eyes: Conjunctivae, EOM and lids are normal. Pupils are equal, round, and reactive to light.  Neck: No JVD present. Carotid bruit is not present. No edema present. No thyroid mass and no thyromegaly present.  Cardiovascular: S1 normal, S2 normal and normal heart sounds. Exam reveals no gallop.  No murmur heard. Pulses:      Dorsalis pedis pulses are 2+ on the right side, and 2+ on the left side.  Respiratory: No respiratory distress. He has decreased breath sounds in the right lower field and the left lower field. He has no wheezes. He has no rhonchi. He has rales in the left lower field.  GI: Soft. Bowel sounds are normal. He exhibits distension. There is no tenderness.  Musculoskeletal:   Right wrist: He exhibits swelling.       Left wrist: He exhibits swelling.       Right ankle: He exhibits no swelling.       Left ankle: He exhibits no swelling.  Lymphadenopathy:    He has no cervical adenopathy.  Neurological: He is alert and oriented to person, place, and time. No cranial nerve deficit.  Skin: Skin is warm. No rash noted. Nails show no clubbing.  Psychiatric: He has a normal mood and affect.      Data Reviewed: Basic Metabolic Panel: Recent Labs  Lab 03/10/17 0431  03/12/17 1321 03/13/17 0336 03/13/17 1335 03/13/17 1924 03/14/17 0525  NA 132*   < > 123* 126* 128* 130* 130*  K 4.0   < > 5.2* 5.4* 4.8 4.4 5.8*  CL 95*   < > 87* 89* 88* 92* 94*  CO2 23   < > 18* 23 25 25 24   GLUCOSE 545*   < > 448* 311* 231* 142* 263*  BUN 42*   < > 104* 114* 121* 97* 96*  CREATININE 3.77*   < > 5.04* 4.99* 4.67* 3.82* 3.38*  CALCIUM 8.2*   < > 7.3* 7.3* 7.3* 7.4* 7.7*  MG 2.3  --   --   --   --  2.3 2.4  PHOS  --   --   --   --  7.7* 6.3* 6.9*   < > = values in this interval not displayed.   Liver Function Tests: Recent  Labs  Lab 03/11/17 1026 03/13/17 1335  AST 35 20  ALT 8* 5*  ALKPHOS 78 67  BILITOT 0.9 0.7  PROT 7.2 6.5  ALBUMIN 3.4* 3.3*   CBC: Recent Labs  Lab 03/09/17 1115 03/10/17 0431 03/11/17 1026 03/13/17 1335 03/14/17 0525  WBC 7.6 13.0* 24.1* 13.1* 11.1*  HGB 13.9 12.3* 13.0 12.1* 12.5*  HCT 41.9 37.9* 40.3 36.7* 38.6*  MCV 93.3 93.8 92.3 91.0 91.4  PLT 140* 152 183 165 152   Cardiac Enzymes: Recent Labs  Lab 03/09/17 1115  TROPONINI 0.03*    CBG: Recent Labs  Lab 03/13/17 1602 03/13/17 1808 03/13/17 2153 03/14/17 0755 03/14/17 1207  GLUCAP 194* 125* 102* 258* 285*    Recent Results (from the past 240 hour(s))  MRSA PCR Screening     Status: None   Collection Time: 03/10/17  1:43 PM  Result Value Ref Range Status   MRSA by PCR NEGATIVE NEGATIVE Final    Comment:        The GeneXpert MRSA Assay (FDA approved for NASAL  specimens only), is one component of a comprehensive MRSA colonization surveillance program. It is not intended to diagnose MRSA infection nor to guide or monitor treatment for MRSA infections.      Studies: Dg Chest Port 1 View  Result Date: 03/13/2017 CLINICAL DATA:  Status post central line flow EXAM: PORTABLE CHEST 1 VIEW COMPARISON:  03/10/2017 FINDINGS: Right jugular central line is noted with catheter tip in the superior aspect of the right atrium. No pneumothorax is noted. The lungs are well aerated with mild vascular congestion. No focal confluent infiltrate is seen. Cardiomegaly remains. Pacing device is again seen and stable. IMPRESSION: Status post central line placement without evidence of pneumothorax. Electronically Signed   By: Troy CleverMark  Lukens M.D.   On: 03/13/2017 11:59    Scheduled Meds: . amiodarone  200 mg Oral BID  . budesonide (PULMICORT) nebulizer solution  0.25 mg Nebulization BID  . calcium carbonate  1 tablet Oral BID  . carbidopa-levodopa  1 tablet Oral QID  . famotidine  40 mg Oral Daily  . gabapentin  100 mg Oral TID  . guaiFENesin  600 mg Oral BID  . heparin subcutaneous  5,000 Units Subcutaneous Q8H  . insulin aspart  0-15 Units Subcutaneous TID WC  . insulin aspart  0-5 Units Subcutaneous QHS  . insulin aspart  14 Units Subcutaneous TID WC  . insulin glargine  30 Units Subcutaneous Q24H  . ipratropium-albuterol  3 mL Nebulization Q4H  . sodium chloride flush  3 mL Intravenous Q12H  . valACYclovir  1,000 mg Oral TID   Continuous Infusions: . sodium chloride    . amiodarone Stopped (03/11/17 1004)    Assessment/Plan:  1. Acute on chronic diastolic congestive heart failure with acute on chronic respiratory failure.  Hemodialysis to help out with fluid management today. 2. Acute kidney injury on chronic kidney disease stage III.  Acute cardiorenal syndrome. nephrology hoping hemodialysis will be temporary.  Patient does not want long-term  hemodialysis. 3. Atrial fibrillation with rapid ventricular rate.  Amiodarone drip switched to p.o. 4. Insulin-dependent diabetes mellitus with hyperglycemia.  Now off insulin drip and on Lantus insulin with sliding scale. 5. COPD exacerbation off steroids and on nebulizers only. 6. DVT prophylaxis with heparin subcutaneous injections  Code Status:     Code Status Orders  (From admission, onward)        Start     Ordered   03/09/17 1937  Do not attempt resuscitation (DNR)  Continuous    Question Answer Comment  In the event of cardiac or respiratory ARREST Do not call a "code blue"   In the event of cardiac or respiratory ARREST Do not perform Intubation, CPR, defibrillation or ACLS   In the event of cardiac or respiratory ARREST Use medication by any route, position, wound care, and other measures to relive pain and suffering. May use oxygen, suction and manual treatment of airway obstruction as needed for comfort.      03/09/17 1936    Code Status History    Date Active Date Inactive Code Status Order ID Comments User Context   05/09/2016 15:37 05/13/2016 18:31 DNR [id number]  Katha Hamming, [name] ED   03/19/2016 16:54 03/21/2016 18:26 DNR [id number]  Gracelyn Nurse, [name] Inpatient   12/14/2015 12:09 12/16/2015 19:58 DNR [id number]  Altamese Dilling, [name] Inpatient   12/13/2015 20:55 12/14/2015 12:09 Full Code [id number]  Tonye Royalty, DO ED   12/13/2015 20:50 12/13/2015 20:55 DNR [id number]  Tonye Royalty, DO ED   12/07/2015 06:27 12/11/2015 17:06 DNR [id number]  Ihor Austin, [name] ED   09/25/2015 01:18 09/28/2015 20:53 DNR [id number]  Oralia Manis, [name] Inpatient   08/07/2015 19:50 08/09/2015 15:23 DNR [id number]  Darrol Jump, PA-C Inpatient   08/03/2015 16:58 08/07/2015 19:50 DNR [id number]  Houston Siren, [name]   02/25/2015 19:56 03/01/2015 19:21 Full Code [id number]  Gale Journey, [name]    Advance Directive Documentation     Most Recent Value  Type of Advance Directive   Living will  Pre-existing out of facility DNR order (yellow form or pink MOST form)  No data  "MOST" Form in Place?  No data     Family Communication: Wife at bedside Disposition Plan: To be determined  Consultants:  Critical care specialist  Nephrology  Procedures:  Temporary dialysis catheter in right neck  Time spent: 28 minutes  Alford Highland  [employer]

## 2017-03-15 ENCOUNTER — Inpatient Hospital Stay: Payer: Medicare Other

## 2017-03-15 LAB — PHOSPHORUS: Phosphorus: 3.4 mg/dL (ref 2.5–4.6)

## 2017-03-15 LAB — BASIC METABOLIC PANEL
Anion gap: 10 (ref 5–15)
BUN: 50 mg/dL — AB (ref 6–20)
CALCIUM: 8.3 mg/dL — AB (ref 8.9–10.3)
CO2: 29 mmol/L (ref 22–32)
Chloride: 98 mmol/L — ABNORMAL LOW (ref 101–111)
Creatinine, Ser: 1.63 mg/dL — ABNORMAL HIGH (ref 0.61–1.24)
GFR calc Af Amer: 41 mL/min — ABNORMAL LOW (ref >60)
GFR, EST NON AFRICAN AMERICAN: 35 mL/min — AB (ref >60)
GLUCOSE: 125 mg/dL — AB (ref 65–99)
Potassium: 4.9 mmol/L (ref 3.5–5.1)
Sodium: 137 mmol/L (ref 135–145)

## 2017-03-15 LAB — CBC
HEMATOCRIT: 38.5 % — AB (ref 40.0–52.0)
Hemoglobin: 12.6 g/dL — ABNORMAL LOW (ref 13.0–18.0)
MCH: 30.2 pg (ref 26.0–34.0)
MCHC: 32.8 g/dL (ref 32.0–36.0)
MCV: 91.8 fL (ref 80.0–100.0)
PLATELETS: 159 10*3/uL (ref 150–440)
RBC: 4.19 MIL/uL — ABNORMAL LOW (ref 4.40–5.90)
RDW: 15.3 % — AB (ref 11.5–14.5)
WBC: 12.7 10*3/uL — ABNORMAL HIGH (ref 3.8–10.6)

## 2017-03-15 LAB — GLUCOSE, CAPILLARY
GLUCOSE-CAPILLARY: 160 mg/dL — AB (ref 65–99)
GLUCOSE-CAPILLARY: 137 mg/dL — AB (ref 65–99)
Glucose-Capillary: 128 mg/dL — ABNORMAL HIGH (ref 65–99)
Glucose-Capillary: 89 mg/dL (ref 65–99)

## 2017-03-15 LAB — MAGNESIUM: Magnesium: 2.3 mg/dL (ref 1.7–2.4)

## 2017-03-15 MED ORDER — INSULIN GLARGINE 100 UNIT/ML ~~LOC~~ SOLN
20.0000 [IU] | Freq: Every day | SUBCUTANEOUS | Status: DC
  Administered 2017-03-16 – 2017-03-18 (×3): 20 [IU] via SUBCUTANEOUS
  Filled 2017-03-15 (×3): qty 0.2

## 2017-03-15 MED ORDER — ORAL CARE MOUTH RINSE
15.0000 mL | Freq: Two times a day (BID) | OROMUCOSAL | Status: DC
  Administered 2017-03-15 – 2017-03-18 (×6): 15 mL via OROMUCOSAL

## 2017-03-15 MED ORDER — BISACODYL 10 MG RE SUPP
10.0000 mg | Freq: Once | RECTAL | Status: AC
  Administered 2017-03-15: 10 mg via RECTAL
  Filled 2017-03-15: qty 1

## 2017-03-15 MED ORDER — ALUM & MAG HYDROXIDE-SIMETH 200-200-20 MG/5ML PO SUSP
30.0000 mL | ORAL | Status: DC | PRN
  Administered 2017-03-15: 30 mL via ORAL
  Filled 2017-03-15: qty 30

## 2017-03-15 MED ORDER — IPRATROPIUM-ALBUTEROL 0.5-2.5 (3) MG/3ML IN SOLN
3.0000 mL | Freq: Four times a day (QID) | RESPIRATORY_TRACT | Status: DC
  Administered 2017-03-15 – 2017-03-18 (×13): 3 mL via RESPIRATORY_TRACT
  Filled 2017-03-15 (×13): qty 3

## 2017-03-15 MED ORDER — FUROSEMIDE 40 MG PO TABS
40.0000 mg | ORAL_TABLET | Freq: Every day | ORAL | Status: DC
  Administered 2017-03-15 – 2017-03-18 (×4): 40 mg via ORAL
  Filled 2017-03-15 (×4): qty 1

## 2017-03-15 MED ORDER — INSULIN ASPART 100 UNIT/ML ~~LOC~~ SOLN: 5.0000 [IU] | Freq: Three times a day (TID) | SUBCUTANEOUS | Status: DC

## 2017-03-15 NOTE — Progress Notes (Signed)
Report called to Casimiro NeedleMichael on 2C, pt to transport in 2C bed.  Portable O2 will accompany.  We will transfer his chart meds and belongings with him.  Wife in room, agreeable to transfer  VSS, alert and oriented to all but date.

## 2017-03-15 NOTE — Progress Notes (Signed)
[facility]  ROUNDING NOTE   Subjective:   Second hemodialysis treatment yesterday. Tolerated treatment well. UF of 500mL.   Wife at bedside.   UOP 2100  K 4.9  Objective:  Vital signs in last 24 hours:  Temp:  [97.9 F (36.6 C)-98.2 F (36.8 C)] 98.2 F (36.8 C) (11/11 0800) Pulse Rate:  [73-102] 82 (11/11 0800) Resp:  [12-23] 13 (11/11 0800) BP: (111-138)/(64-87) 132/84 (11/11 0800) SpO2:  [91 %-100 %] 100 % (11/11 0800) FiO2 (%):  [35 %] 35 % (11/11 0000) Weight:  [112.4 kg (247 lb 12.8 oz)-116.2 kg (256 lb 2.8 oz)] 112.4 kg (247 lb 12.8 oz) (11/10 1853)  Weight change: -1.7 kg (-12 oz) Filed Weights   03/13/17 1728 03/14/17 1540 03/14/17 1853  Weight: 117.7 kg (259 lb 7.7 oz) 116.2 kg (256 lb 2.8 oz) 112.4 kg (247 lb 12.8 oz)    Intake/Output: I/O last 3 completed shifts: In: 370 [P.O.:360; I.V.:10] Out: 3204 [Urine:3190; Other:14]   Intake/Output this shift:  Total I/O In: 237 [P.O.:237] Out: 200 [Urine:200]  Physical Exam: General: Ill appearing  Head: Normocephalic, atraumatic. Moist oral mucosal membranes  Eyes: Anicteric, PERRL  Neck: Supple, trachea midline  Lungs:  Clear, 2 L [place]  Heart: irregular  Abdomen:  Soft, nontender, obese   Extremities: trace peripheral edema.  Neurologic: Nonfocal, moving all four extremities  Skin: No lesions  Access: RIJ temp cath Dr. Belia HemanKasa 11/9  GU: foley    Basic Metabolic Panel: Recent Labs  Lab 03/10/17 0431  03/13/17 1335 03/13/17 1924 03/14/17 0525 03/14/17 2217 03/15/17 0416  NA 132*   < > 128* 130* 130* 136 137  K 4.0   < > 4.8 4.4 5.8* 4.9 4.9  CL 95*   < > 88* 92* 94* 97* 98*  CO2 23   < > 25 25 24 28 29   GLUCOSE 545*   < > 231* 142* 263* 92 125*  BUN 42*   < > 121* 97* 96* 51* 50*  CREATININE 3.77*   < > 4.67* 3.82* 3.38* 1.66* 1.63*  CALCIUM 8.2*   < > 7.3* 7.4* 7.7* 8.2* 8.3*  MG 2.3  --   --  2.3 2.4 2.2 2.3  PHOS  --   --  7.7* 6.3* 6.9* 3.7 3.4   < > = values in this  interval not displayed.    Liver Function Tests: Recent Labs  Lab 03/11/17 1026 03/13/17 1335  AST 35 20  ALT 8* 5*  ALKPHOS 78 67  BILITOT 0.9 0.7  PROT 7.2 6.5  ALBUMIN 3.4* 3.3*   No results for input(s): LIPASE, AMYLASE in the last 168 hours. No results for input(s): AMMONIA in the last 168 hours.  CBC: Recent Labs  Lab 03/10/17 0431 03/11/17 1026 03/13/17 1335 03/14/17 0525 03/15/17 0416  WBC 13.0* 24.1* 13.1* 11.1* 12.7*  HGB 12.3* 13.0 12.1* 12.5* 12.6*  HCT 37.9* 40.3 36.7* 38.6* 38.5*  MCV 93.8 92.3 91.0 91.4 91.8  PLT 152 183 165 152 159    Cardiac Enzymes: Recent Labs  Lab 03/09/17 1115  TROPONINI 0.03*    BNP: Invalid input(s): POCBNP  CBG: Recent Labs  Lab 03/14/17 0755 03/14/17 1207 03/14/17 1649 03/14/17 2217 03/15/17 0722  GLUCAP 258* 285* 209* 84 128*    Microbiology: Results for orders placed or performed during the hospital encounter of 03/09/17  MRSA PCR Screening     Status: None   Collection Time: 03/10/17  1:43 PM  Result Value  Ref Range Status   MRSA by PCR NEGATIVE NEGATIVE Final    Comment:        The GeneXpert MRSA Assay (FDA approved for NASAL specimens only), is one component of a comprehensive MRSA colonization surveillance program. It is not intended to diagnose MRSA infection nor to guide or monitor treatment for MRSA infections.     Coagulation Studies: No results for input(s): LABPROT, INR in the last 72 hours.  Urinalysis: No results for input(s): COLORURINE, LABSPEC, PHURINE, GLUCOSEU, HGBUR, BILIRUBINUR, KETONESUR, PROTEINUR, UROBILINOGEN, NITRITE, LEUKOCYTESUR in the last 72 hours.  Invalid input(s): APPERANCEUR    Imaging: Dg Chest Port 1 View  Result Date: 03/15/2017 CLINICAL DATA:  Respiratory failure EXAM: PORTABLE CHEST 1 VIEW COMPARISON:  March 13, 2017 FINDINGS: Central catheter tip is in superior vena cava. There is a pacemaker with lead tip attached to right ventricle. No  pneumothorax. There is atelectatic change in the left base. There is no edema or consolidation. There is cardiomegaly with pulmonary venous hypertension. IMPRESSION: No pneumothorax evident. Cardiomegaly with pulmonary vascular congestion. No frank edema or consolidation. There is atelectasis in the left base. Electronically Signed   By: Bretta Bang III M.D.   On: 03/15/2017 07:10   Dg Chest Port 1 View  Result Date: 03/13/2017 CLINICAL DATA:  Status post central line flow EXAM: PORTABLE CHEST 1 VIEW COMPARISON:  03/10/2017 FINDINGS: Right jugular central line is noted with catheter tip in the superior aspect of the right atrium. No pneumothorax is noted. The lungs are well aerated with mild vascular congestion. No focal confluent infiltrate is seen. Cardiomegaly remains. Pacing device is again seen and stable. IMPRESSION: Status post central line placement without evidence of pneumothorax. Electronically Signed   By: Alcide Clever M.D.   On: 03/13/2017 11:59     Medications:   . sodium chloride    . amiodarone Stopped (03/11/17 1004)   . amiodarone  200 mg Oral BID  . budesonide (PULMICORT) nebulizer solution  0.25 mg Nebulization BID  . calcium carbonate  1 tablet Oral BID  . carbidopa-levodopa  1 tablet Oral QID  . famotidine  40 mg Oral Daily  . gabapentin  100 mg Oral TID  . guaiFENesin  600 mg Oral BID  . heparin subcutaneous  5,000 Units Subcutaneous Q8H  . insulin aspart  0-15 Units Subcutaneous TID WC  . insulin aspart  0-5 Units Subcutaneous QHS  . insulin aspart  14 Units Subcutaneous TID WC  . insulin glargine  30 Units Subcutaneous Q24H  . ipratropium-albuterol  3 mL Nebulization Q4H  . sodium chloride flush  3 mL Intravenous Q12H  . valACYclovir  1,000 mg Oral TID   sodium chloride, acetaminophen **OR** acetaminophen, albuterol, amiodarone, dextrose, heparin, HYDROcodone-acetaminophen, ondansetron **OR** ondansetron (ZOFRAN) IV, sodium chloride flush  Assessment/  Plan:  [name] is a 83 y.o. white male with hypertension, COPD, coronary artery disease status post CABG, congestive heart failure, atrial fibrillation, AICD placement, CVA, parkinson's, hypothyroidism, diabetes mellitus type II, who was admitted to [facility] on 03/09/2017    1. Acute renal failure on chronic kidney disease stage III: baseline creatinine of 1.23, GFR of 49.  Nonoliguric urine output. Creatinine improving. Volume status acceptable. Potassium, sodium and acidosis have improved.  No acute indication for dialysis.   Acute renal failure secondary to acute cardiorenal syndrome.  Chronic kidney disease secondary to diabetes and hypertension  2. Hypertension: with acute exacerbation of congestive heart failure systolic and atrial fibrillation - amiodarone -  restart furosemide - home dose 40mg  PO daily.   3. Acute respiratory failure: Acute exacerbation of congestive heart failure (echo 11/6 with preserved systolic function).  Acute exacerbation of COPD.   - continue supportive care.  - 3 L [place]      LOS: 5 Yamile Roedl 11/11/20189:56 AM

## 2017-03-15 NOTE — Clinical Social Work Note (Signed)
Clinical Social Work Assessment  Patient Details  Name: Troy [name] MRN: [id number] Date of Birth: 12-Dec-1923  Date of referral:  03/15/17               Reason for consult:  Facility Placement                Permission sought to share information with:  Chartered certified accountant granted to share information::  Yes, Verbal Permission Granted  Name::        Agency::     Relationship::     Contact Information:     Housing/Transportation Living arrangements for the past 2 months:  Single Family Home Source of Information:  Patient, Spouse, Medical Team Patient Interpreter Needed:  None Criminal Activity/Legal Involvement Pertinent to Current Situation/Hospitalization:  No - Comment as needed Significant Relationships:  Adult Children, [employer], [place], Spouse Lives with:  Spouse Do you feel safe going back to the place where you live?  Yes Need for family participation in patient care:  No (Coment)  Care giving concerns: PT recommendation for STR   Social Worker assessment / plan:  CSW met with the patient and his wife Olegario Shearer at bedside to discuss discharge planning. The patient indicated that his wife could speak for him as he felt too tired to converse once he gave verbal permission to conduct the SNF referral. Olegario Shearer stated that the preference is for [facility] The CSW explained the referral process, and Olegario Shearer verbalized understanding.  At baseline, the patient uses a cane and is sight impaired. According to Olegario Shearer, he sees shadows and can manage self-feeding if meat is cut for him and the placement of the place set up is explained to him. The patient occasionally uses a wheelchair in the community on days where long distance walking would be required. The patient lives with his wife and has support from her and his two adult sons.  Discharge will be tomorrow or Tuesday, most likely. The CSW will follow up with Olegario Shearer to provide bed offers and will  continue to follow for discharge facilitation.  Employment status:  Retired Nurse, adult PT Recommendations:  [facility] / Referral to community resources:  [place]  Patient/Family's Response to care:  The patient and his wife thanked the CSW for assistance and time.  Patient/Family's Understanding of and Emotional Response to Diagnosis, Current Treatment, and Prognosis:  The patient and his wife understand the recommendation for STR and are in agreement with the discharge plan.  Emotional Assessment Appearance:  Appears stated age Attitude/Demeanor/Rapport:  Lethargic Affect (typically observed):  Withdrawn Orientation:  Oriented to Self, Oriented to Place, Oriented to  Time, Oriented to Situation Alcohol / Substance use:  Never Used Psych involvement (Current and /or in the community):  No (Comment)  Discharge Needs  Concerns to be addressed:  Discharge Planning Concerns, Care Coordination Readmission within the last 30 days:  No Current discharge risk:  None Barriers to Discharge:  Continued Medical Work up   [employer], LCSW 03/15/2017, 2:20 PM

## 2017-03-15 NOTE — Progress Notes (Signed)
Patient ID: Troy BurnRobert S France, male   DOB: 08/02/1923, 83 y.o.   MRN: [id number]  [employer] PROGRESS NOTE  Troy BurnRobert S Mcdaid WUJ:[id number] DOB: 08/02/1923 DOA: 03/09/2017 PCP: Barbette ReichmannHande, Vishwanath, MD  HPI/Subjective: Patient with shortness of breath and some cough.  Swelling is a little bit better again today.  Patient with weakness.  Objective: Vitals:   03/15/17 0500 03/15/17 0600  BP: 137/72 127/87  Pulse: 91 89  Resp: 14 15  Temp:    SpO2: 92% 94%    Filed Weights   03/13/17 1728 03/14/17 1540 03/14/17 1853  Weight: 117.7 kg (259 lb 7.7 oz) 116.2 kg (256 lb 2.8 oz) 112.4 kg (247 lb 12.8 oz)    ROS: Review of Systems  Constitutional: Negative for chills and fever.  Eyes: Negative for blurred vision.  Respiratory: Positive for cough and shortness of breath.   Cardiovascular: Negative for chest pain.  Gastrointestinal: Negative for abdominal pain, constipation, diarrhea, nausea and vomiting.  Genitourinary: Negative for dysuria.  Musculoskeletal: Negative for joint pain.  Neurological: Positive for weakness. Negative for dizziness and headaches.   Exam: Physical Exam  Constitutional: He is oriented to person, place, and time.  HENT:  Nose: No mucosal edema.  Mouth/Throat: No oropharyngeal exudate or posterior oropharyngeal edema.  Eyes: Conjunctivae, EOM and lids are normal. Pupils are equal, round, and reactive to light.  Neck: No JVD present. Carotid bruit is not present. No edema present. No thyroid mass and no thyromegaly present.  Cardiovascular: S1 normal, S2 normal and normal heart sounds. Exam reveals no gallop.  No murmur heard. Pulses:      Dorsalis pedis pulses are 2+ on the right side, and 2+ on the left side.  Respiratory: No respiratory distress. He has decreased breath sounds in the right lower field and the left lower field. He has no wheezes. He has no rhonchi. He has rales in the left lower field.  GI: Soft. Bowel sounds are normal. He exhibits  distension. There is no tenderness.  Musculoskeletal:       Right wrist: He exhibits swelling.       Left wrist: He exhibits swelling.       Right ankle: He exhibits no swelling.       Left ankle: He exhibits no swelling.  Lymphadenopathy:    He has no cervical adenopathy.  Neurological: He is alert and oriented to person, place, and time. No cranial nerve deficit.  Skin: Skin is warm. No rash noted. Nails show no clubbing.  Psychiatric: He has a normal mood and affect.      Data Reviewed: Basic Metabolic Panel: Recent Labs  Lab 03/10/17 0431  03/13/17 1335 03/13/17 1924 03/14/17 0525 03/14/17 2217 03/15/17 0416  NA 132*   < > 128* 130* 130* 136 137  K 4.0   < > 4.8 4.4 5.8* 4.9 4.9  CL 95*   < > 88* 92* 94* 97* 98*  CO2 23   < > 25 25 24 28 29   GLUCOSE 545*   < > 231* 142* 263* 92 125*  BUN 42*   < > 121* 97* 96* 51* 50*  CREATININE 3.77*   < > 4.67* 3.82* 3.38* 1.66* 1.63*  CALCIUM 8.2*   < > 7.3* 7.4* 7.7* 8.2* 8.3*  MG 2.3  --   --  2.3 2.4 2.2 2.3  PHOS  --   --  7.7* 6.3* 6.9* 3.7 3.4   < > = values in this interval not displayed.  Liver Function Tests: Recent Labs  Lab 03/11/17 1026 03/13/17 1335  AST 35 20  ALT 8* 5*  ALKPHOS 78 67  BILITOT 0.9 0.7  PROT 7.2 6.5  ALBUMIN 3.4* 3.3*   CBC: Recent Labs  Lab 03/10/17 0431 03/11/17 1026 03/13/17 1335 03/14/17 0525 03/15/17 0416  WBC 13.0* 24.1* 13.1* 11.1* 12.7*  HGB 12.3* 13.0 12.1* 12.5* 12.6*  HCT 37.9* 40.3 36.7* 38.6* 38.5*  MCV 93.8 92.3 91.0 91.4 91.8  PLT 152 183 165 152 159   Cardiac Enzymes: Recent Labs  Lab 03/09/17 1115  TROPONINI 0.03*    CBG: Recent Labs  Lab 03/13/17 2153 03/14/17 0755 03/14/17 1207 03/14/17 1649 03/14/17 2217  GLUCAP 102* 258* 285* 209* 84    Recent Results (from the past 240 hour(s))  MRSA PCR Screening     Status: None   Collection Time: 03/10/17  1:43 PM  Result Value Ref Range Status   MRSA by PCR NEGATIVE NEGATIVE Final    Comment:         The GeneXpert MRSA Assay (FDA approved for NASAL specimens only), is one component of a comprehensive MRSA colonization surveillance program. It is not intended to diagnose MRSA infection nor to guide or monitor treatment for MRSA infections.      Studies: Dg Chest Port 1 View  Result Date: 03/15/2017 CLINICAL DATA:  Respiratory failure EXAM: PORTABLE CHEST 1 VIEW COMPARISON:  March 13, 2017 FINDINGS: Central catheter tip is in superior vena cava. There is a pacemaker with lead tip attached to right ventricle. No pneumothorax. There is atelectatic change in the left base. There is no edema or consolidation. There is cardiomegaly with pulmonary venous hypertension. IMPRESSION: No pneumothorax evident. Cardiomegaly with pulmonary vascular congestion. No frank edema or consolidation. There is atelectasis in the left base. Electronically Signed   By: Bretta BangWilliam  Woodruff III M.D.   On: 03/15/2017 07:10   Dg Chest Port 1 View  Result Date: 03/13/2017 CLINICAL DATA:  Status post central line flow EXAM: PORTABLE CHEST 1 VIEW COMPARISON:  03/10/2017 FINDINGS: Right jugular central line is noted with catheter tip in the superior aspect of the right atrium. No pneumothorax is noted. The lungs are well aerated with mild vascular congestion. No focal confluent infiltrate is seen. Cardiomegaly remains. Pacing device is again seen and stable. IMPRESSION: Status post central line placement without evidence of pneumothorax. Electronically Signed   By: Alcide CleverMark  Lukens M.D.   On: 03/13/2017 11:59    Scheduled Meds: . amiodarone  200 mg Oral BID  . budesonide (PULMICORT) nebulizer solution  0.25 mg Nebulization BID  . calcium carbonate  1 tablet Oral BID  . carbidopa-levodopa  1 tablet Oral QID  . famotidine  40 mg Oral Daily  . gabapentin  100 mg Oral TID  . guaiFENesin  600 mg Oral BID  . heparin subcutaneous  5,000 Units Subcutaneous Q8H  . insulin aspart  0-15 Units Subcutaneous TID WC  . insulin  aspart  0-5 Units Subcutaneous QHS  . insulin aspart  14 Units Subcutaneous TID WC  . insulin glargine  30 Units Subcutaneous Q24H  . ipratropium-albuterol  3 mL Nebulization Q4H  . sodium chloride flush  3 mL Intravenous Q12H  . valACYclovir  1,000 mg Oral TID   Continuous Infusions: . sodium chloride    . amiodarone Stopped (03/11/17 1004)    Assessment/Plan:  1. Acute on chronic diastolic congestive heart failure with acute on chronic respiratory failure.  Hemodialysis done yesterday. 2. Acute  kidney injury on chronic kidney disease stage III.  Acute cardiorenal syndrome. Nephrology hoping hemodialysis will be temporary.  Patient does not want long-term hemodialysis.  Creatinine improved today after dialysis yesterday. 3. Hyperkalemia yesterday treated with dialysis.  Improved today 4. Atrial fibrillation with rapid ventricular rate.  Amiodarone drip switched to oral.  Patient not on any anticoagulation for stroke prevention. 5. Insulin-dependent diabetes mellitus with hyperglycemia.  Now off insulin drip and on Lantus insulin with sliding scale.  Since evening sugars are on the lower side can consider decreasing the dose of Lantus. 6. COPD exacerbation off steroids and on nebulizers only. 7. DVT prophylaxis with heparin subcutaneous injections 8. Parkinson's disease on Sinemet 9. Weakness.  Physical therapy recommended rehab 10. Diabetic neuropathy on gabapentin  Code Status:     Code Status Orders  (From admission, onward)        Start     Ordered   03/09/17 1937  Do not attempt resuscitation (DNR)  Continuous    Question Answer Comment  In the event of cardiac or respiratory ARREST Do not call a "code blue"   In the event of cardiac or respiratory ARREST Do not perform Intubation, CPR, defibrillation or ACLS   In the event of cardiac or respiratory ARREST Use medication by any route, position, wound care, and other measures to relive pain and suffering. May use oxygen,  suction and manual treatment of airway obstruction as needed for comfort.      03/09/17 1936    Code Status History    Date Active Date Inactive Code Status Order ID Comments User Context   05/09/2016 15:37 05/13/2016 18:31 DNR [id number]  Katha Hamming, MD ED   03/19/2016 16:54 03/21/2016 18:26 DNR [id number]  Gracelyn Nurse, MD Inpatient   12/14/2015 12:09 12/16/2015 19:58 DNR [id number]  Altamese Dilling, MD Inpatient   12/13/2015 20:55 12/14/2015 12:09 Full Code [id number]  Tonye Royalty, DO ED   12/13/2015 20:50 12/13/2015 20:55 DNR [id number]  Tonye Royalty, DO ED   12/07/2015 06:27 12/11/2015 17:06 DNR [id number]  Ihor Austin, MD ED   09/25/2015 01:18 09/28/2015 20:53 DNR [id number]  Oralia Manis, MD Inpatient   08/07/2015 19:50 08/09/2015 15:23 DNR [id number]  Darrol Jump, PA-C Inpatient   08/03/2015 16:58 08/07/2015 19:50 DNR [id number]  Houston Siren, MD ED   02/25/2015 19:56 03/01/2015 19:21 Full Code [id number]  Gale Journey, MD ED    Advance Directive Documentation     Most Recent Value  Type of Advance Directive  Living will  Pre-existing out of facility DNR order (yellow form or pink MOST form)  No data  "MOST" Form in Place?  No data     Family Communication: Wife yesterday, while in CCU as per intensivist Disposition Plan: To be determined  Consultants:  Critical care specialist  Nephrology  Procedures:  Temporary dialysis catheter in right neck  Time spent: 25 minutes  Alford Highland  [employer]

## 2017-03-15 NOTE — Progress Notes (Signed)
Patient has refused bipap for the night. Flutter at bedside. Patient has refused also

## 2017-03-15 NOTE — Progress Notes (Signed)
   CHIEF COMPLAINT:   Chief Complaint  Patient presents with  . Shortness of Breath  . Weakness    Subjective  Follow up cardio-renal syndrome Weaned off of bipPA On minimal oxygen Follow up nephrology recs HD 2 days in a row, may need another      Objective   Examination: General exam: Appears calm and comfortable on biPAP Respiratory system: Clear to auscultation. Respiratory effort normal. HEENT: [place]/AT, PERRLA, no thrush, no stridor. Cardiovascular system: S1 & S2 heard, RRR. No JVD, murmurs, rubs, gallops or clicks. No pedal edema. Gastrointestinal system: Abdomen is nondistended, soft and nontender. No organomegaly or masses felt. Normal bowel sounds heard. Central nervous system: Alert and oriented. No focal neurological deficits. Extremities: Symmetric 5 x 5 power. Skin: No rashes, lesions or ulcers Psychiatry: Judgement and insight poor.    VITALS:  height is 5' 9.5" (1.765 m) and weight is 247 lb 12.8 oz (112.4 kg). His axillary temperature is 98 F (36.7 C). His blood pressure is 127/87 and his pulse is 89. His respiration is 15 and oxygen saturation is 94%.   I personally reviewed Labs under Results section.      Assessment/Plan:  83 yo obese white male with progressive and worsening cardio-renal syndrome Patient started on HD  *Acute on chronic systolic congestive heart failure with acute on chronic respiratory failure Patient has poor urine output.  HD started  * Acute COPD exacerbation  - steroids if needed - Scheduled Nebulizers - Inhalers -Wean O2 as tolerated  * Acute kidney injury over CKD stage III due to cardiorenal syndrome.   HD to be started today. Follow up nephrology recs  * Atrial fibrillation with rapid ventricular rate. Amiodarone drip changed to PO  * DVT prophylaxis.   Subcutaneous heparin   CODE STATUS: DNR   Lucie LeatherKurian David Isolde Skaff, M.D.  [facility]  Medical Director [facility]  [facility] Medical Director [facility]

## 2017-03-15 NOTE — [place] FL2 (Signed)
[place] MEDICAID FL2 LEVEL OF CARE SCREENING TOOL     IDENTIFICATION  Patient Name: Troy [name] Birthdate: 08-Aug-1923 Sex: male Admission Date (Current Location): 03/09/2017  [place] and [place] Number:  Chiropodist and Address:  [facility], [address], [place], [place] [address]      Provider Number: [id number]  Attending Physician Name and Address:  Alford Highland, MD  Relative Name and Phone Number:  Shreyas Piatkowski (spouse) [phone]    Current Level of Care: Hospital Recommended Level of Care: Skilled Nursing Facility Prior Approval Number:    Date Approved/Denied:   PASRR Number: [id number]  Discharge Plan: SNF    Current Diagnoses: Patient Active Problem List   Diagnosis Date Noted  . Acute kidney injury (HCC) 03/10/2017  . Acute respiratory failure (HCC) 03/09/2017  . Acute on chronic respiratory failure (HCC) 05/09/2016  . Dehydration 03/21/2016  . Hypotension 03/21/2016  . Fall 03/21/2016  . Chest wall contusion, left, initial encounter 03/21/2016  . Hypoxia 03/21/2016  . Generalized weakness 03/21/2016  . Leukocytosis 03/21/2016  . Acute on chronic renal failure (HCC) 03/19/2016  . Acute exacerbation of chronic obstructive pulmonary disease (COPD) (HCC) 12/07/2015  . Acute on chronic respiratory failure with hypoxia (HCC) 09/28/2015  . HTN (hypertension) 09/25/2015  . Parkinson's disease (HCC) 09/25/2015  . Hypothyroidism 09/25/2015  . COPD exacerbation (HCC) 09/12/2015  . Diabetes (HCC) 08/20/2015  . SSS (sick sinus syndrome) (HCC) 08/07/2015  . Chronic atrial fibrillation (HCC)   . Sinus pause   . Sick sinus syndrome (HCC)   . Acute on chronic diastolic CHF (congestive heart failure), NYHA class 1 (HCC) 08/03/2015  . Lower extremity weakness 02/25/2015    Orientation RESPIRATION BLADDER Height & Weight     Self, Time, Situation, Place  O2(O2 3L acute) Continent Weight: 247 lb 12.8 oz (112.4  kg) Height:  5' 9.5" (176.5 cm)  BEHAVIORAL SYMPTOMS/MOOD NEUROLOGICAL BOWEL NUTRITION STATUS      Continent Diet(Heart Healthy, Carb Modified)  AMBULATORY STATUS COMMUNICATION OF NEEDS Skin   Extensive Assist Verbally Normal                       Personal Care Assistance Level of Assistance  Bathing, Feeding, Dressing Bathing Assistance: Limited assistance Feeding assistance: Independent/Limited assistance Dressing Assistance: Limited assistance     Functional Limitations Info   Sight Impaired          SPECIAL CARE FACTORS FREQUENCY  PT (By licensed PT), OT (By licensed OT)     PT Frequency: Up to 5X per day, 5 days per week OT Frequency: Up to 5X per day, 5 days per week            Contractures Contractures Info: Not present    Additional Factors Info  Code Status, Allergies, Insulin Sliding Scale Code Status Info: Full Allergies Info: No Known Allergies   Insulin Sliding Scale Info: 0-15 units Novolog tid with meals; 0-5 units Novolog qhs       Current Medications (03/15/2017):  This is the current hospital active medication list Current Facility-Administered Medications  Medication Dose Route Frequency Provider Last Rate Last Dose  . 0.9 %  sodium chloride infusion  250 mL Intravenous PRN Auburn Bilberry, MD      . acetaminophen (TYLENOL) tablet 650 mg  650 mg Oral Q6H PRN Auburn Bilberry, MD       Or  . acetaminophen (TYLENOL) suppository 650 mg  650 mg Rectal Q6H  PRN Auburn BilberryPatel, Shreyang, MD      . albuterol (PROVENTIL) (2.5 MG/3ML) 0.083% nebulizer solution 2.5 mg  2.5 mg Nebulization Q4H PRN Sudini, Wardell HeathSrikar, MD      . amiodarone (NEXTERONE) IV bolus only 150 mg/100 mL  150 mg Intravenous PRN Lewie Loronukov, Magadalene S, NP   Stopped at 03/11/17 1004  . amiodarone (PACERONE) tablet 200 mg  200 mg Oral BID Erin FullingKasa, Kurian, MD   200 mg at 03/15/17 0946  . budesonide (PULMICORT) nebulizer solution 0.25 mg  0.25 mg Nebulization BID Auburn BilberryPatel, Shreyang, MD   0.25 mg at 03/15/17  0802  . calcium carbonate (TUMS - dosed in mg elemental calcium) chewable tablet 200 mg of elemental calcium  1 tablet Oral BID Erin FullingKasa, Kurian, MD   200 mg of elemental calcium at 03/15/17 0946  . carbidopa-levodopa (SINEMET IR) 25-250 MG per tablet immediate release 1 tablet  1 tablet Oral QID Auburn BilberryPatel, Shreyang, MD   1 tablet at 03/15/17 0948  . dextrose 50 % solution 25 mL  25 mL Intravenous PRN Marylou Flesherukov, Magadalene S, NP   13 mL at 03/11/17 0857  . famotidine (PEPCID) tablet 40 mg  40 mg Oral Daily Erin FullingKasa, Kurian, MD   40 mg at 03/15/17 0946  . furosemide (LASIX) tablet 40 mg  40 mg Oral Daily Kolluru, Sarath, MD      . gabapentin (NEURONTIN) capsule 100 mg  100 mg Oral TID Auburn BilberryPatel, Shreyang, MD   100 mg at 03/15/17 0948  . guaiFENesin (MUCINEX) 12 hr tablet 600 mg  600 mg Oral BID Auburn BilberryPatel, Shreyang, MD   600 mg at 03/15/17 0946  . heparin injection 1,000-6,000 Units  1,000-6,000 Units Intravenous PRN Eugenie NorrieBlakeney, Dana G, NP   1,000 Units at 03/13/17 1418  . heparin injection 5,000 Units  5,000 Units Subcutaneous Q8H Erin FullingKasa, Kurian, MD   5,000 Units at 03/15/17 0519  . HYDROcodone-acetaminophen (NORCO/VICODIN) 5-325 MG per tablet 1 tablet  1 tablet Oral Q6H PRN Auburn BilberryPatel, Shreyang, MD   1 tablet at 03/14/17 2217  . insulin aspart (novoLOG) injection 0-15 Units  0-15 Units Subcutaneous TID WC Milagros LollSudini, Srikar, MD   2 Units at 03/15/17 0746  . insulin aspart (novoLOG) injection 0-5 Units  0-5 Units Subcutaneous QHS Milagros LollSudini, Srikar, MD   4 Units at 03/12/17 2122  . insulin aspart (novoLOG) injection 14 Units  14 Units Subcutaneous TID WC LifseyAtha Starks, Hannah C, RPH   14 Units at 03/15/17 0746  . insulin glargine (LANTUS) injection 30 Units  30 Units Subcutaneous Q24H Lewie Loronukov, Magadalene S, NP   30 Units at 03/15/17 0948  . ipratropium-albuterol (DUONEB) 0.5-2.5 (3) MG/3ML nebulizer solution 3 mL  3 mL Nebulization Q4H Erin FullingKasa, Kurian, MD   3 mL at 03/15/17 0802  . MEDLINE mouth rinse  15 mL Mouth Rinse BID Erin FullingKasa, Kurian, MD      .  ondansetron (ZOFRAN) tablet 4 mg  4 mg Oral Q6H PRN Auburn BilberryPatel, Shreyang, MD       Or  . ondansetron (ZOFRAN) injection 4 mg  4 mg Intravenous Q6H PRN Auburn BilberryPatel, Shreyang, MD      . sodium chloride flush (NS) 0.9 % injection 3 mL  3 mL Intravenous Q12H Auburn BilberryPatel, Shreyang, MD   3 mL at 03/15/17 0948  . sodium chloride flush (NS) 0.9 % injection 3 mL  3 mL Intravenous PRN Auburn BilberryPatel, Shreyang, MD      . valACYclovir (VALTREX) tablet 1,000 mg  1,000 mg Oral TID Auburn BilberryPatel, Shreyang, MD   1,000 mg at  03/15/17 0947     Discharge Medications: Please see discharge summary for a list of discharge medications.  Relevant Imaging Results:  Relevant Lab Results:   Additional Information SS# [id number]  Judi CongKaren M Myonna Chisom, LCSW

## 2017-03-16 DIAGNOSIS — L8990 Pressure ulcer of unspecified site, unspecified stage: Secondary | ICD-10-CM

## 2017-03-16 LAB — MAGNESIUM: MAGNESIUM: 2.2 mg/dL (ref 1.7–2.4)

## 2017-03-16 LAB — COMPREHENSIVE METABOLIC PANEL
ALBUMIN: 3.1 g/dL — AB (ref 3.5–5.0)
ALK PHOS: 58 U/L (ref 38–126)
ALT: 5 U/L — ABNORMAL LOW (ref 17–63)
AST: 16 U/L (ref 15–41)
AST: 17 U/L (ref 15–41)
Albumin: 3 g/dL — ABNORMAL LOW (ref 3.5–5.0)
Alkaline Phosphatase: 60 U/L (ref 38–126)
Anion gap: 7 (ref 5–15)
Anion gap: 8 (ref 5–15)
BILIRUBIN TOTAL: 1.1 mg/dL (ref 0.3–1.2)
BUN: 40 mg/dL — AB (ref 6–20)
BUN: 42 mg/dL — AB (ref 6–20)
CHLORIDE: 98 mmol/L — AB (ref 101–111)
CO2: 30 mmol/L (ref 22–32)
CO2: 32 mmol/L (ref 22–32)
CREATININE: 1.52 mg/dL — AB (ref 0.61–1.24)
Calcium: 8.4 mg/dL — ABNORMAL LOW (ref 8.9–10.3)
Calcium: 8.4 mg/dL — ABNORMAL LOW (ref 8.9–10.3)
Chloride: 97 mmol/L — ABNORMAL LOW (ref 101–111)
Creatinine, Ser: 1.45 mg/dL — ABNORMAL HIGH (ref 0.61–1.24)
GFR calc Af Amer: 47 mL/min — ABNORMAL LOW (ref >60)
GFR calc non Af Amer: 38 mL/min — ABNORMAL LOW (ref >60)
GFR calc non Af Amer: 41 mL/min — ABNORMAL LOW (ref >60)
GFR, EST AFRICAN AMERICAN: 44 mL/min — AB (ref >60)
GLUCOSE: 154 mg/dL — AB (ref 65–99)
Glucose, Bld: 160 mg/dL — ABNORMAL HIGH (ref 65–99)
POTASSIUM: 4.7 mmol/L (ref 3.5–5.1)
POTASSIUM: 4.8 mmol/L (ref 3.5–5.1)
SODIUM: 136 mmol/L (ref 135–145)
SODIUM: 136 mmol/L (ref 135–145)
TOTAL PROTEIN: 6.4 g/dL — AB (ref 6.5–8.1)
Total Bilirubin: 1.3 mg/dL — ABNORMAL HIGH (ref 0.3–1.2)
Total Protein: 6.3 g/dL — ABNORMAL LOW (ref 6.5–8.1)

## 2017-03-16 LAB — GLUCOSE, CAPILLARY
GLUCOSE-CAPILLARY: 126 mg/dL — AB (ref 65–99)
GLUCOSE-CAPILLARY: 150 mg/dL — AB (ref 65–99)
GLUCOSE-CAPILLARY: 153 mg/dL — AB (ref 65–99)
Glucose-Capillary: 170 mg/dL — ABNORMAL HIGH (ref 65–99)

## 2017-03-16 MED ORDER — BUDESONIDE 0.5 MG/2ML IN SUSP
0.5000 mg | Freq: Two times a day (BID) | RESPIRATORY_TRACT | Status: DC
  Administered 2017-03-16 – 2017-03-18 (×4): 0.5 mg via RESPIRATORY_TRACT
  Filled 2017-03-16 (×4): qty 2

## 2017-03-16 MED ORDER — RENA-VITE PO TABS
1.0000 | ORAL_TABLET | Freq: Every day | ORAL | Status: DC
  Administered 2017-03-16 – 2017-03-17 (×2): 1 via ORAL
  Filled 2017-03-16 (×3): qty 1

## 2017-03-16 MED ORDER — ENSURE ENLIVE PO LIQD
237.0000 mL | Freq: Three times a day (TID) | ORAL | Status: DC
  Administered 2017-03-16 – 2017-03-18 (×6): 237 mL via ORAL

## 2017-03-16 MED ORDER — ACETYLCYSTEINE 20 % IN SOLN
2.0000 mL | Freq: Two times a day (BID) | RESPIRATORY_TRACT | Status: DC
  Administered 2017-03-16 – 2017-03-17 (×4): 2 mL via RESPIRATORY_TRACT
  Administered 2017-03-18: 4 mL via RESPIRATORY_TRACT
  Filled 2017-03-16 (×6): qty 4

## 2017-03-16 MED ORDER — APIXABAN 2.5 MG PO TABS
2.5000 mg | ORAL_TABLET | Freq: Two times a day (BID) | ORAL | Status: DC
  Administered 2017-03-16 – 2017-03-18 (×4): 2.5 mg via ORAL
  Filled 2017-03-16 (×4): qty 1

## 2017-03-16 MED ORDER — OCUVITE-LUTEIN PO CAPS
1.0000 | ORAL_CAPSULE | Freq: Every day | ORAL | Status: DC
  Administered 2017-03-16 – 2017-03-18 (×3): 1 via ORAL
  Filled 2017-03-16 (×3): qty 1

## 2017-03-16 MED ORDER — TAMSULOSIN HCL 0.4 MG PO CAPS
0.4000 mg | ORAL_CAPSULE | Freq: Every day | ORAL | Status: DC
  Administered 2017-03-16 – 2017-03-18 (×3): 0.4 mg via ORAL
  Filled 2017-03-16 (×3): qty 1

## 2017-03-16 NOTE — Clinical Social Work Note (Signed)
Troy [name] at [facility] has stated she will not be offering on patient. Thus, bed offer at [facility] has been accepted. [name] MSW,LCSW [phone]

## 2017-03-16 NOTE — Progress Notes (Signed)
Initial Nutrition Assessment  DOCUMENTATION CODES:   Obesity unspecified  INTERVENTION:   Recommend monitor K, P, and Mg as pt at high refeeding risk  Recommend check vitamin D labs  Ocuvite daily  Renal MVI daily   Ensure Enlive po TID, each supplement provides 350 kcal and 20 grams of protein  Liberalize diet until oral intake improves   Recommend daily weights  NUTRITION DIAGNOSIS:   Inadequate oral intake related to acute illness as evidenced by meal completion < 25%.  GOAL:   Patient will meet greater than or equal to 90% of their needs  MONITOR:   PO intake, Supplement acceptance, Labs, Weight trends, I & O's, Skin  REASON FOR ASSESSMENT:   LOS    ASSESSMENT:   83 y.o. white male with hypertension, COPD, coronary artery disease status post CABG, congestive heart failure, atrial fibrillation, AICD placement, CVA, parkinson's, hypothyroidism, diabetes mellitus type II, who was admitted to [facility] on 03/09/2017 for SOB   Met with pt and pt's wife in room today. Pt sleeping at time of RD visit so history obtained from pt's wife. Pt's wife reports that pt has had poor appetite and oral intake at home for the past two weeks. Wife reports that pt has only been eating bites of meals for over one week now. Pt ate <25% of his breakfast this morning and his lunch tray was untouched on his bedside table. Pt does not drink any supplements at home. Pt s/p RT IJ cath and initiated on HD 11/9. Pt with moderate edema in BLE and is 8lbs up from his UBW. Per pt's wife, pt is weight stable pta. Pt with pressure injury on his heel and bruising on his BUE. RD suspects pt with vitamin C deficiency secondary to poor appetite, wound healing, and dialysis. RD will order Ocuvite and Renavite; the combination of these two vitamins will promote wound healing and replace losses from HD. Pt noted to have low B12 level on 10/25; pt may need IV supplementation. Pt with elevated PTH; recommend check  vitamin D levels; recommend maintain levels >30ng/ml to help suppress PTH and promote bone health. RD will order Ensure as pt likes chocolate and strawberry flavors; can switch to Nepro if needed. Recommend monitor K, P, and Mg as pt at high refeeding risk. RD will liberalize diet to try and increase oral intake. Pt may need NGT placement and enteral feeds if oral intake does not improve in 2-3 days.   Medications reviewed and include: Tums, pepcid, lasix, heparin, insulin  Labs reviewed: Cl 97(L), BUN 40(H), creat 1.45(H), Ca 8.4(L) adj. 9.12 wnl, alb 3.1(L) Wbc- 12.7(H) cbgs- 125, 160, 154 x 48 hrs AIC 9.1(H)- 11/16 PTH- 377(H)- 11/9 B12- 161(L)- 10/25  Nutrition-Focused physical exam completed. Findings are no fat depletion, no muscle depletion, and moderate edema in BLE. Pt noted to have bruising on BUE.    Diet Order:  Diet heart healthy/carb modified Room service appropriate? No; Fluid consistency: Thin  EDUCATION NEEDS:   Not appropriate for education at this time  Skin:  Skin Integrity Issues:(deep tissue injury heel)  Last BM:  11/12- type 5  Height:   Ht Readings from Last 1 Encounters:  03/10/17 5' 9.5" (1.765 m)    Weight:   Wt Readings from Last 1 Encounters:  03/14/17 247 lb 12.8 oz (112.4 kg)    Ideal Body Weight:  74 kg  BMI:  Body mass index is 36.07 kg/m.  Estimated Nutritional Needs:   Kcal:  2100-2400kcal/day  Protein:  120-130g/day   Fluid:  >2L/day or per MD  Koleen Distance MS, RD, LDN Pager #[phone] After Hours Pager: [phone]

## 2017-03-16 NOTE — Progress Notes (Signed)
Physical Therapy Treatment Patient Details Name: Troy BurnRobert [name] MRN: [id number] DOB: Dec 24, 1923 Today's Date: 03/16/2017    History of Present Illness Troy [name] is a 83 y.o. white male with hypertension, COPD, coronary artery disease status post CABG, congestive heart failure, atrial fibrillation, AICD placement, CVA, parkinson's, hypothyroidism, diabetes mellitus type II, who was admitted to [facility] on 03/09/2017      PT Comments    Patient with significant generalized weakness this date, requiring increased assist for all functional activities.  Tolerates unsupported sitting activities x8-10 minutes this date, but requires return to supine for rest periods. Unable to complete/attempt lateral scooting or sit/stand transfers due to weakness/fatigue. Educated patient/wife in positioning/skin protection to L heel for pressure relief; bilat prevalon booties donned end of session. Mod SOB with minimal activities, sats >90% on supplemental O2 throughout session. Wife hopeful for removal of R IJ dialysis catheter next date.   Follow Up Recommendations  SNF     Equipment Recommendations  None recommended by PT    Recommendations for Other Services       Precautions / Restrictions Precautions Precautions: Fall;ICD/Pacemaker Precaution Comments: R IJ temp dialysis cath Restrictions Weight Bearing Restrictions: No    Mobility  Bed Mobility Overal bed mobility: Needs Assistance Bed Mobility: Supine to Sit;Sit to Supine     Supine to sit: Mod assist;Max assist Sit to supine: Max assist   General bed mobility comments: mobilizes LEs off edge of bed, but requires extensive assist for truncal elevation  Transfers                 General transfer comment: unsafe/unable to tolerate due to extreme weakness/fatigue  Ambulation/Gait             General Gait Details: unsafe/unable   Stairs            Wheelchair Mobility    Modified Rankin (Stroke  Patients Only)       Balance Overall balance assessment: Needs assistance Sitting-balance support: No upper extremity supported;Feet supported Sitting balance-Leahy Scale: Fair                                      Cognition Arousal/Alertness: Awake/alert Behavior During Therapy: WFL for tasks assessed/performed Overall Cognitive Status: Within Functional Limits for tasks assessed                                        Exercises Other Exercises Other Exercises: Maintains unsupported sitting balance with cga/close sup x8 minutes; notably fatigued with exertion/activity Other Exercises: Seated LE therex, 1x15, AROM for muscular strength/endurance wtih functional activities.  Min cuing for slow, controlled movements and ROM through full, available range. Other Exercises: Attempted lateral scooting edge of bed-unable to generate lift off or lateral movement from bed surface without dep assist this date Other Exercises: Educated patient/wife in positioning and pressure-relief to heels (noted darkened, blister to L heel); applied prevalon booties to bilat feet end of session.    General Comments        Pertinent Vitals/Pain Pain Assessment: No/denies pain    Home Living                      Prior Function            PT Goals (current goals  can now be found in the care plan section) Acute Rehab PT Goals Patient Stated Goal: To go home. PT Goal Formulation: With patient Time For Goal Achievement: 03/28/17 Potential to Achieve Goals: Good    Frequency    Min 2X/week      PT Plan      Co-evaluation              AM-PAC PT "6 Clicks" Daily Activity  Outcome Measure  Difficulty turning over in bed (including adjusting bedclothes, sheets and blankets)?: Unable Difficulty moving from lying on back to sitting on the side of the bed? : Unable Difficulty sitting down on and standing up from a chair with arms (e.g., wheelchair,  bedside commode, etc,.)?: Unable Help needed moving to and from a bed to chair (including a wheelchair)?: Total Help needed walking in hospital room?: Total Help needed climbing 3-5 steps with a railing? : Total 6 Click Score: 6    End of Session Equipment Utilized During Treatment: Gait belt;Oxygen Activity Tolerance: Patient limited by fatigue Patient left: in bed;with call bell/phone within reach;with bed alarm set;with family/visitor present Nurse Communication: Mobility status PT Visit Diagnosis: Unsteadiness on feet (R26.81);Muscle weakness (generalized) (M62.81);Difficulty in walking, not elsewhere classified (R26.2)     Time: [id number] PT Time Calculation (min) (ACUTE ONLY): 26 min  Charges:  $Therapeutic Exercise: 8-22 mins $Therapeutic Activity: 8-22 mins                    G Codes:       Ilisa Hayworth H. Manson PasseyBrown, PT, DPT, NCS 03/16/17, 11:53 AM [phone]

## 2017-03-16 NOTE — Progress Notes (Signed)
Patient ID: Troy [name], male   DOB: 1923/11/13, 83 y.o.   MRN: [id number]   [facility] PROGRESS NOTE  Troy [name] WGN:[id number] DOB: 30-Oct-1923 DOA: 03/09/2017 PCP: Barbette Reichmann, MD  HPI/Subjective: Patient still with some upper airway congestion and some shortness of breath.  Still feels very weak.  Appetite not that good.  Some pain in his left heel.  Also a rash on his left ankle  Objective: Vitals:   03/16/17 0832 03/16/17 1250  BP: (!) 131/49 (!) 141/72  Pulse: 90 80  Resp:  18  Temp: 98.7 F (37.1 C) 98.2 F (36.8 C)  SpO2:  95%    Filed Weights   03/13/17 1728 03/14/17 1540 03/14/17 1853  Weight: 117.7 kg (259 lb 7.7 oz) 116.2 kg (256 lb 2.8 oz) 112.4 kg (247 lb 12.8 oz)    ROS: Review of Systems  Constitutional: Negative for chills and fever.  Eyes: Negative for blurred vision.  Respiratory: Positive for cough and shortness of breath.   Cardiovascular: Negative for chest pain.  Gastrointestinal: Negative for abdominal pain, constipation, diarrhea, nausea and vomiting.  Genitourinary: Negative for dysuria.  Musculoskeletal: Negative for joint pain.  Neurological: Positive for weakness. Negative for dizziness and headaches.   Exam: Physical Exam  Constitutional: He is oriented to person, place, and time.  HENT:  Nose: No mucosal edema.  Mouth/Throat: No oropharyngeal exudate or posterior oropharyngeal edema.  Eyes: Conjunctivae, EOM and lids are normal. Pupils are equal, round, and reactive to light.  Neck: No JVD present. Carotid bruit is not present. No edema present. No thyroid mass and no thyromegaly present.  Cardiovascular: S1 normal, S2 normal and normal heart sounds. Exam reveals no gallop.  No murmur heard. Pulses:      Dorsalis pedis pulses are 2+ on the right side, and 2+ on the left side.  Respiratory: No respiratory distress. He has decreased breath sounds in the right lower field and the left lower field. He has no wheezes. He  has no rhonchi. He has rales in the left lower field.  GI: Soft. Bowel sounds are normal. He exhibits distension. There is no tenderness.  Musculoskeletal:       Right wrist: He exhibits swelling.       Left wrist: He exhibits swelling.       Right ankle: He exhibits no swelling.       Left ankle: He exhibits no swelling.  Lymphadenopathy:    He has no cervical adenopathy.  Neurological: He is alert and oriented to person, place, and time. No cranial nerve deficit.  Skin: Skin is warm. Nails show no clubbing.  Left heel discoloration and softness.  Left ankle some erythematous macules which are blanching  Psychiatric: He has a normal mood and affect.      Data Reviewed: Basic Metabolic Panel: Recent Labs  Lab 03/13/17 1335 03/13/17 1924 03/14/17 0525 03/14/17 2217 03/15/17 0416 03/16/17 0011 03/16/17 0318  NA 128* 130* 130* 136 137 136 136  K 4.8 4.4 5.8* 4.9 4.9 4.8 4.7  CL 88* 92* 94* 97* 98* 98* 97*  CO2 25 25 24 28 29 30  32  GLUCOSE 231* 142* 263* 92 125* 160* 154*  BUN 121* 97* 96* 51* 50* 42* 40*  CREATININE 4.67* 3.82* 3.38* 1.66* 1.63* 1.52* 1.45*  CALCIUM 7.3* 7.4* 7.7* 8.2* 8.3* 8.4* 8.4*  MG  --  2.3 2.4 2.2 2.3 2.2  --   PHOS 7.7* 6.3* 6.9* 3.7 3.4  --   --  Liver Function Tests: Recent Labs  Lab 03/11/17 1026 03/13/17 1335 03/16/17 0011 03/16/17 0318  AST 35 20 16 17   ALT 8* 5* <5* 5*  ALKPHOS 78 67 60 58  BILITOT 0.9 0.7 1.3* 1.1  PROT 7.2 6.5 6.3* 6.4*  ALBUMIN 3.4* 3.3* 3.0* 3.1*   CBC: Recent Labs  Lab 03/10/17 0431 03/11/17 1026 03/13/17 1335 03/14/17 0525 03/15/17 0416  WBC 13.0* 24.1* 13.1* 11.1* 12.7*  HGB 12.3* 13.0 12.1* 12.5* 12.6*  HCT 37.9* 40.3 36.7* 38.6* 38.5*  MCV 93.8 92.3 91.0 91.4 91.8  PLT 152 183 165 152 159    CBG: Recent Labs  Lab 03/15/17 1252 03/15/17 1635 03/15/17 2128 03/16/17 0746 03/16/17 1158  GLUCAP 89 160* 137* 126* 150*    Recent Results (from the past 240 hour(s))  MRSA PCR Screening      Status: None   Collection Time: 03/10/17  1:43 PM  Result Value Ref Range Status   MRSA by PCR NEGATIVE NEGATIVE Final    Comment:        The GeneXpert MRSA Assay (FDA approved for NASAL specimens only), is one component of a comprehensive MRSA colonization surveillance program. It is not intended to diagnose MRSA infection nor to guide or monitor treatment for MRSA infections.      Studies: Dg Chest Port 1 View  Result Date: 03/15/2017 CLINICAL DATA:  Respiratory failure EXAM: PORTABLE CHEST 1 VIEW COMPARISON:  March 13, 2017 FINDINGS: Central catheter tip is in superior vena cava. There is a pacemaker with lead tip attached to right ventricle. No pneumothorax. There is atelectatic change in the left base. There is no edema or consolidation. There is cardiomegaly with pulmonary venous hypertension. IMPRESSION: No pneumothorax evident. Cardiomegaly with pulmonary vascular congestion. No frank edema or consolidation. There is atelectasis in the left base. Electronically Signed   By: Bretta BangWilliam  Woodruff III M.D.   On: 03/15/2017 07:10    Scheduled Meds: . acetylcysteine  2 mL Nebulization BID  . amiodarone  200 mg Oral BID  . apixaban  2.5 mg Oral BID  . budesonide (PULMICORT) nebulizer solution  0.5 mg Nebulization BID  . calcium carbonate  1 tablet Oral BID  . carbidopa-levodopa  1 tablet Oral QID  . famotidine  40 mg Oral Daily  . feeding supplement (ENSURE ENLIVE)  237 mL Oral TID BM  . furosemide  40 mg Oral Daily  . gabapentin  100 mg Oral TID  . guaiFENesin  600 mg Oral BID  . insulin aspart  0-15 Units Subcutaneous TID WC  . insulin aspart  0-5 Units Subcutaneous QHS  . insulin glargine  20 Units Subcutaneous Daily  . ipratropium-albuterol  3 mL Nebulization Q6H  . mouth rinse  15 mL Mouth Rinse BID  . multivitamin  1 tablet Oral Daily  . multivitamin-lutein  1 capsule Oral Daily  . sodium chloride flush  3 mL Intravenous Q12H  . tamsulosin  0.4 mg Oral Daily    Continuous Infusions: . sodium chloride      Assessment/Plan:  1. Acute on chronic diastolic congestive heart failure with acute on chronic respiratory failure.  Nephrology holding on hemodialysis.  Patient on oral Lasix. 2. Acute kidney injury on chronic kidney disease stage III.  Acute cardiorenal syndrome. Nephrology holding on hemodialysis at this time.  Patient does not want long-term hemodialysis.  Creatinine improved today.  Continue to monitor 3. Hyperkalemia treated with dialysis previously 4. Atrial fibrillation with rapid ventricular rate.  Amiodarone drip switched  to oral.  Restart low-dose Eliquis. 5. Insulin-dependent diabetes mellitus with hyperglycemia.  Decrease Lantus dose and just sliding scale for now. 6. COPD exacerbation off steroids.  Added Mucomyst nebulizers to try to break up upper airway wheeze.  Continue DuoNeb and budesonide nebulizers 7. Parkinson's disease on Sinemet 8. Weakness.  Physical therapy recommended rehab 9. Diabetic neuropathy on gabapentin 10. Urinary retention.  Started Flomax hopefully can get the Foley catheter out 11. Discontinue Valtrex 12. Rash on the left lower extremity.  Send off a hepatitis C 13. Heel decubiti difficult to determine stage but skin is intact.[name].  Heel protectors while in bed  Code Status:     Code Status Orders  (From admission, onward)        Start     Ordered   03/09/17 1937  Do not attempt resuscitation (DNR)  Continuous    Question Answer Comment  In the event of cardiac or respiratory ARREST Do not call a "code blue"   In the event of cardiac or respiratory ARREST Do not perform Intubation, CPR, defibrillation or ACLS   In the event of cardiac or respiratory ARREST Use medication by any route, position, wound care, and other measures to relive pain and suffering. May use oxygen, suction and manual treatment of airway obstruction as needed for comfort.      03/09/17 1936    Code Status History    Date  Active Date Inactive Code Status Order ID Comments User Context   05/09/2016 15:37 05/13/2016 18:31 DNR [id number]  Katha HammingKonidena, Snehalatha, MD ED   03/19/2016 16:54 03/21/2016 18:26 DNR [id number]  Gracelyn NurseJohnston, John D, MD Inpatient   12/14/2015 12:09 12/16/2015 19:58 DNR [id number]  Altamese DillingVachhani, Vaibhavkumar, MD Inpatient   12/13/2015 20:55 12/14/2015 12:09 Full Code [id number]  Tonye RoyaltyHugelmeyer, Alexis, DO ED   12/13/2015 20:50 12/13/2015 20:55 DNR [id number]  Tonye RoyaltyHugelmeyer, Alexis, DO ED   12/07/2015 06:27 12/11/2015 17:06 DNR [id number]  Ihor AustinPyreddy, Pavan, MD ED   09/25/2015 01:18 09/28/2015 20:53 DNR [id number]  Oralia ManisWillis, David, MD Inpatient   08/07/2015 19:50 08/09/2015 15:23 DNR [id number]  Darrol JumpBarrett, Rhonda G, PA-C Inpatient   08/03/2015 16:58 08/07/2015 19:50 DNR [id number]  Houston SirenSainani, Vivek J, MD ED   02/25/2015 19:56 03/01/2015 19:21 Full Code [id number]  Gale JourneyWalsh, Catherine P, MD ED    Advance Directive Documentation     Most Recent Value  Type of Advance Directive  Living will  Pre-existing out of facility DNR order (yellow form or pink MOST form)  No data  "MOST" Form in Place?  No data     Family Communication: Wife today Disposition Plan: Will need rehab  Consultants:  Critical care specialist  Nephrology  Procedures:  Temporary dialysis catheter in right neck  Time spent: 25 minutes  Alford HighlandWIETING, Roxi Hlavaty  [employer]

## 2017-03-16 NOTE — Progress Notes (Signed)
ANTICOAGULATION CONSULT NOTE - Initial Consult  Pharmacy Consult for Apixaban Indication: atrial fibrillation  No Known Allergies  Patient Measurements: Height: 5' 9.5" (176.5 cm) Weight: 247 lb 12.8 oz (112.4 kg) IBW/kg (Calculated) : 71.85  Vital Signs: Temp: 98.2 F (36.8 C) (11/12 1250) Temp Source: Oral (11/12 1250) BP: 141/72 (11/12 1250) Pulse Rate: 80 (11/12 1250)  Labs: Recent Labs    03/14/17 0525  03/15/17 0416 03/16/17 0011 03/16/17 0318  HGB 12.5*  --  12.6*  --   --   HCT 38.6*  --  38.5*  --   --   PLT 152  --  159  --   --   CREATININE 3.38*   < > 1.63* 1.52* 1.45*   < > = values in this interval not displayed.    Estimated Creatinine Clearance: 41.3 mL/min (A) (by C-G formula based on SCr of 1.45 mg/dL (H)).  Assessment: 83 yo male on apixaban 2.5 mg PO BID PTA. Last dose of SQ heparin at 1324 today.   Goal of Therapy:  Monitor platelets by anticoagulation protocol: Yes   Plan:  Will restart home dose apixaban 2.5 mg PO BID - SCr borderline, dosing discussed with hospitalist.  SCr and CBC at least every three days per policy.   Crist FatWang, Deshanna Kama L 03/16/2017,3:25 PM

## 2017-03-16 NOTE — Clinical Social Work Note (Signed)
CSW spoke with patient and patient's wife this morning and extended bed offers. [facility] currently is the only facility that has not responded. CSW checking with [facility] to see if they can offer. Patient's wife stated that if they cannot, that she wants [facility]. York SpanielMonica Zavian Slowey MSW,LCSW [phone]

## 2017-03-16 NOTE — Progress Notes (Signed)
[facility]  ROUNDING NOTE   Subjective:   Urine output appears to be improving Last 24 hours 1800 cc 725 cc so far.  Foley collection Patient  is trying to eat more No acute shortness of breath  Objective:  Vital signs in last 24 hours:  Temp:  [97.7 F (36.5 C)-98.9 F (37.2 C)] 98.7 F (37.1 C) (11/12 0832) Pulse Rate:  [85-98] 90 (11/12 0832) Resp:  [14-22] 22 (11/12 0449) BP: (122-150)/(49-85) 131/49 (11/12 0832) SpO2:  [92 %-96 %] 96 % (11/12 0449)  Weight change:  Filed Weights   03/13/17 1728 03/14/17 1540 03/14/17 1853  Weight: 117.7 kg (259 lb 7.7 oz) 116.2 kg (256 lb 2.8 oz) 112.4 kg (247 lb 12.8 oz)    Intake/Output: I/O last 3 completed shifts: In: 1200 [P.O.:1190; I.V.:10] Out: 3000 [Urine:3000]   Intake/Output this shift:  No intake/output data recorded.  Physical Exam: General: Ill appearing  Head: Normocephalic, atraumatic. Moist oral mucosal membranes  Eyes: Anicteric,  Neck: Supple, trachea midline  Lungs:  Clear, 2 L [place]  Heart: irregular  Abdomen:  Soft, nontender, obese   Extremities: trace peripheral edema.  Neurologic: Nonfocal, moving all four extremities  Skin: No lesions  Access: RIJ temp cath Dr. Belia HemanKasa 11/9  GU: foley    Basic Metabolic Panel: Recent Labs  Lab 03/13/17 1335 03/13/17 1924 03/14/17 0525 03/14/17 2217 03/15/17 0416 03/16/17 0011 03/16/17 0318  NA 128* 130* 130* 136 137 136 136  K 4.8 4.4 5.8* 4.9 4.9 4.8 4.7  CL 88* 92* 94* 97* 98* 98* 97*  CO2 25 25 24 28 29 30  32  GLUCOSE 231* 142* 263* 92 125* 160* 154*  BUN 121* 97* 96* 51* 50* 42* 40*  CREATININE 4.67* 3.82* 3.38* 1.66* 1.63* 1.52* 1.45*  CALCIUM 7.3* 7.4* 7.7* 8.2* 8.3* 8.4* 8.4*  MG  --  2.3 2.4 2.2 2.3 2.2  --   PHOS 7.7* 6.3* 6.9* 3.7 3.4  --   --     Liver Function Tests: Recent Labs  Lab 03/11/17 1026 03/13/17 1335 03/16/17 0011 03/16/17 0318  AST 35 20 16 17   ALT 8* 5* <5* 5*  ALKPHOS 78 67 60 58  BILITOT 0.9 0.7 1.3*  1.1  PROT 7.2 6.5 6.3* 6.4*  ALBUMIN 3.4* 3.3* 3.0* 3.1*   No results for input(s): LIPASE, AMYLASE in the last 168 hours. No results for input(s): AMMONIA in the last 168 hours.  CBC: Recent Labs  Lab 03/10/17 0431 03/11/17 1026 03/13/17 1335 03/14/17 0525 03/15/17 0416  WBC 13.0* 24.1* 13.1* 11.1* 12.7*  HGB 12.3* 13.0 12.1* 12.5* 12.6*  HCT 37.9* 40.3 36.7* 38.6* 38.5*  MCV 93.8 92.3 91.0 91.4 91.8  PLT 152 183 165 152 159    Cardiac Enzymes: Recent Labs  Lab 03/09/17 1115  TROPONINI 0.03*    BNP: Invalid input(s): POCBNP  CBG: Recent Labs  Lab 03/15/17 0722 03/15/17 1252 03/15/17 1635 03/15/17 2128 03/16/17 0746  GLUCAP 128* 89 160* 137* 126*    Microbiology: Results for orders placed or performed during the hospital encounter of 03/09/17  MRSA PCR Screening     Status: None   Collection Time: 03/10/17  1:43 PM  Result Value Ref Range Status   MRSA by PCR NEGATIVE NEGATIVE Final    Comment:        The GeneXpert MRSA Assay (FDA approved for NASAL specimens only), is one component of a comprehensive MRSA colonization surveillance program. It is not intended to diagnose MRSA  infection nor to guide or monitor treatment for MRSA infections.     Coagulation Studies: No results for input(s): LABPROT, INR in the last 72 hours.  Urinalysis: No results for input(s): COLORURINE, LABSPEC, PHURINE, GLUCOSEU, HGBUR, BILIRUBINUR, KETONESUR, PROTEINUR, UROBILINOGEN, NITRITE, LEUKOCYTESUR in the last 72 hours.  Invalid input(s): APPERANCEUR    Imaging: Dg Chest Port 1 View  Result Date: 03/15/2017 CLINICAL DATA:  Respiratory failure EXAM: PORTABLE CHEST 1 VIEW COMPARISON:  March 13, 2017 FINDINGS: Central catheter tip is in superior vena cava. There is a pacemaker with lead tip attached to right ventricle. No pneumothorax. There is atelectatic change in the left base. There is no edema or consolidation. There is cardiomegaly with pulmonary venous  hypertension. IMPRESSION: No pneumothorax evident. Cardiomegaly with pulmonary vascular congestion. No frank edema or consolidation. There is atelectasis in the left base. Electronically Signed   By: Bretta BangWilliam  Woodruff III M.D.   On: 03/15/2017 07:10     Medications:   . sodium chloride    . amiodarone Stopped (03/11/17 1004)   . amiodarone  200 mg Oral BID  . budesonide (PULMICORT) nebulizer solution  0.25 mg Nebulization BID  . calcium carbonate  1 tablet Oral BID  . carbidopa-levodopa  1 tablet Oral QID  . famotidine  40 mg Oral Daily  . furosemide  40 mg Oral Daily  . gabapentin  100 mg Oral TID  . guaiFENesin  600 mg Oral BID  . heparin subcutaneous  5,000 Units Subcutaneous Q8H  . insulin aspart  0-15 Units Subcutaneous TID WC  . insulin aspart  0-5 Units Subcutaneous QHS  . insulin aspart  5 Units Subcutaneous TID WC  . insulin glargine  20 Units Subcutaneous Daily  . ipratropium-albuterol  3 mL Nebulization Q6H  . mouth rinse  15 mL Mouth Rinse BID  . sodium chloride flush  3 mL Intravenous Q12H  . valACYclovir  1,000 mg Oral TID   sodium chloride, acetaminophen **OR** acetaminophen, albuterol, alum & mag hydroxide-simeth, amiodarone, dextrose, heparin, HYDROcodone-acetaminophen, ondansetron **OR** ondansetron (ZOFRAN) IV, sodium chloride flush  Assessment/ Plan:  [name] is a 83 y.o. white male with hypertension, COPD, coronary artery disease status post CABG, congestive heart failure, atrial fibrillation, AICD placement, CVA, parkinson's, hypothyroidism, diabetes mellitus type II, who [facility]    1. Acute renal failure on chronic kidney disease stage III: baseline creatinine of 1.23, GFR of 49.  Acute renal failure secondary to acute cardiorenal syndrome.  Chronic kidney disease secondary to diabetes and hypertension  Nonoliguric urine output. Creatinine improving. Volume status acceptable. Potassium, sodium and acidosis have  improved.  No acute indication for dialysis.  Continue to monitor urine output closely Keep the dialysis catheter in for 1-2 days   2. Hypertension: with acute exacerbation of congestive heart failure systolic and atrial fibrillation - amiodarone - restart furosemide - home dose 40mg  PO daily.   3. Acute respiratory failure: Acute exacerbation of congestive heart failure (echo 11/6 with preserved systolic function).  Acute exacerbation of COPD.   - continue supportive care.  -Oxygen supplementation     LOS: 6 Marylynne Keelin 11/12/20189:15 AM

## 2017-03-16 NOTE — Progress Notes (Signed)
Pt had a 5 beat run of multifocal Vtach. MD aware and CMP and Mag. ordered.

## 2017-03-16 NOTE — Care Management Important Message (Signed)
Important Message  Patient Details  Name: Troy [name] MRN: [id number] Date of Birth: 12/06/1923   Medicare Important Message Given:  Yes    Chapman FitchBOWEN, Harmonie Verrastro T, RN 03/16/2017, 11:51 AM

## 2017-03-17 LAB — BASIC METABOLIC PANEL
ANION GAP: 10 (ref 5–15)
BUN: 35 mg/dL — ABNORMAL HIGH (ref 6–20)
CALCIUM: 8.6 mg/dL — AB (ref 8.9–10.3)
CO2: 31 mmol/L (ref 22–32)
CREATININE: 1.32 mg/dL — AB (ref 0.61–1.24)
Chloride: 94 mmol/L — ABNORMAL LOW (ref 101–111)
GFR, EST AFRICAN AMERICAN: 53 mL/min — AB (ref >60)
GFR, EST NON AFRICAN AMERICAN: 45 mL/min — AB (ref >60)
Glucose, Bld: 217 mg/dL — ABNORMAL HIGH (ref 65–99)
Potassium: 4.5 mmol/L (ref 3.5–5.1)
Sodium: 135 mmol/L (ref 135–145)

## 2017-03-17 LAB — GLUCOSE, CAPILLARY
GLUCOSE-CAPILLARY: 238 mg/dL — AB (ref 65–99)
GLUCOSE-CAPILLARY: 283 mg/dL — AB (ref 65–99)
Glucose-Capillary: 232 mg/dL — ABNORMAL HIGH (ref 65–99)
Glucose-Capillary: 328 mg/dL — ABNORMAL HIGH (ref 65–99)

## 2017-03-17 MED ORDER — NYSTATIN 100000 UNIT/ML MT SUSP
5.0000 mL | Freq: Four times a day (QID) | OROMUCOSAL | Status: DC
  Administered 2017-03-17 – 2017-03-18 (×5): 500000 [IU] via ORAL
  Filled 2017-03-17 (×5): qty 5

## 2017-03-17 MED ORDER — OXYCODONE HCL 5 MG PO TABS
5.0000 mg | ORAL_TABLET | Freq: Four times a day (QID) | ORAL | Status: DC | PRN
  Administered 2017-03-17 – 2017-03-18 (×2): 5 mg via ORAL
  Filled 2017-03-17 (×2): qty 1

## 2017-03-17 NOTE — Clinical Social Work Note (Signed)
CSW spoke with patient's wife and let her know that [facility] still is not able to offer. She has changed her mind from wanting [facility] to now wanting [facility]. CSW has accepted bed offer from [facility]. York SpanielMonica Rahkim Rabalais MSW,LCSW [phone]

## 2017-03-17 NOTE — Progress Notes (Signed)
[facility]  ROUNDING NOTE   Subjective:   Denies any acute c/o Some cough UOP 2800 cc Serum creatinine improved to 1.32 States he ate breakfast.  No nausea vomiting reported   Objective:  Vital signs in last 24 hours:  Temp:  [98.2 F (36.8 C)-98.6 F (37 C)] 98.6 F (37 C) (11/13 0456) Pulse Rate:  [67-85] 85 (11/13 0456) Resp:  [18-20] 19 (11/13 0456) BP: (135-154)/(72-82) 154/72 (11/13 0456) SpO2:  [93 %-95 %] 94 % (11/13 0456)  Weight change:  Filed Weights   03/13/17 1728 03/14/17 1540 03/14/17 1853  Weight: 117.7 kg (259 lb 7.7 oz) 116.2 kg (256 lb 2.8 oz) 112.4 kg (247 lb 12.8 oz)    Intake/Output: I/O last 3 completed shifts: In: 720 [P.O.:720] Out: 4225 [Urine:4225]   Intake/Output this shift:  No intake/output data recorded.  Physical Exam: General:  No acute distress, laying in the bed  Head: Normocephalic, atraumatic. Moist oral mucosal membranes  Eyes: Anicteric,  Neck: Supple,   Lungs:   Clear to auscultation anteriorly and laterally  Heart: irregular  Abdomen:  Soft, nontender, obese   Extremities: trace peripheral edema.  Feet in bilateral soft boot support  Neurologic: Nonfocal, moving all four extremities     Access: RIJ temp cath Dr. Belia HemanKasa 11/9  GU: foley    Basic Metabolic Panel: Recent Labs  Lab 03/13/17 1335 03/13/17 1924 03/14/17 0525 03/14/17 2217 03/15/17 0416 03/16/17 0011 03/16/17 0318 03/17/17 0536  NA 128* 130* 130* 136 137 136 136 135  K 4.8 4.4 5.8* 4.9 4.9 4.8 4.7 4.5  CL 88* 92* 94* 97* 98* 98* 97* 94*  CO2 25 25 24 28 29 30  32 31  GLUCOSE 231* 142* 263* 92 125* 160* 154* 217*  BUN 121* 97* 96* 51* 50* 42* 40* 35*  CREATININE 4.67* 3.82* 3.38* 1.66* 1.63* 1.52* 1.45* 1.32*  CALCIUM 7.3* 7.4* 7.7* 8.2* 8.3* 8.4* 8.4* 8.6*  MG  --  2.3 2.4 2.2 2.3 2.2  --   --   PHOS 7.7* 6.3* 6.9* 3.7 3.4  --   --   --     Liver Function Tests: Recent Labs  Lab 03/11/17 1026 03/13/17 1335 03/16/17 0011  03/16/17 0318  AST 35 20 16 17   ALT 8* 5* <5* 5*  ALKPHOS 78 67 60 58  BILITOT 0.9 0.7 1.3* 1.1  PROT 7.2 6.5 6.3* 6.4*  ALBUMIN 3.4* 3.3* 3.0* 3.1*   No results for input(s): LIPASE, AMYLASE in the last 168 hours. No results for input(s): AMMONIA in the last 168 hours.  CBC: Recent Labs  Lab 03/11/17 1026 03/13/17 1335 03/14/17 0525 03/15/17 0416  WBC 24.1* 13.1* 11.1* 12.7*  HGB 13.0 12.1* 12.5* 12.6*  HCT 40.3 36.7* 38.6* 38.5*  MCV 92.3 91.0 91.4 91.8  PLT 183 165 152 159    Cardiac Enzymes: No results for input(s): CKTOTAL, CKMB, CKMBINDEX, TROPONINI in the last 168 hours.  BNP: Invalid input(s): POCBNP  CBG: Recent Labs  Lab 03/16/17 0746 03/16/17 1158 03/16/17 1641 03/16/17 2115 03/17/17 0748  GLUCAP 126* 150* 153* 170* 232*    Microbiology: Results for orders placed or performed during the hospital encounter of 03/09/17  MRSA PCR Screening     Status: None   Collection Time: 03/10/17  1:43 PM  Result Value Ref Range Status   MRSA by PCR NEGATIVE NEGATIVE Final    Comment:        The GeneXpert MRSA Assay (FDA approved for NASAL specimens  only), is one component of a comprehensive MRSA colonization surveillance program. It is not intended to diagnose MRSA infection nor to guide or monitor treatment for MRSA infections.     Coagulation Studies: No results for input(s): LABPROT, INR in the last 72 hours.  Urinalysis: No results for input(s): COLORURINE, LABSPEC, PHURINE, GLUCOSEU, HGBUR, BILIRUBINUR, KETONESUR, PROTEINUR, UROBILINOGEN, NITRITE, LEUKOCYTESUR in the last 72 hours.  Invalid input(s): APPERANCEUR    Imaging: No results found.   Medications:   . sodium chloride     . acetylcysteine  2 mL Nebulization BID  . amiodarone  200 mg Oral BID  . apixaban  2.5 mg Oral BID  . budesonide (PULMICORT) nebulizer solution  0.5 mg Nebulization BID  . calcium carbonate  1 tablet Oral BID  . carbidopa-levodopa  1 tablet Oral QID  .  famotidine  40 mg Oral Daily  . feeding supplement (ENSURE ENLIVE)  237 mL Oral TID BM  . furosemide  40 mg Oral Daily  . gabapentin  100 mg Oral TID  . guaiFENesin  600 mg Oral BID  . insulin aspart  0-15 Units Subcutaneous TID WC  . insulin aspart  0-5 Units Subcutaneous QHS  . insulin glargine  20 Units Subcutaneous Daily  . ipratropium-albuterol  3 mL Nebulization Q6H  . mouth rinse  15 mL Mouth Rinse BID  . multivitamin  1 tablet Oral Daily  . multivitamin-lutein  1 capsule Oral Daily  . sodium chloride flush  3 mL Intravenous Q12H  . tamsulosin  0.4 mg Oral Daily   sodium chloride, acetaminophen **OR** acetaminophen, albuterol, alum & mag hydroxide-simeth, dextrose, heparin, HYDROcodone-acetaminophen, ondansetron **OR** ondansetron (ZOFRAN) IV, sodium chloride flush  Assessment/ Plan:  [name] is a 83 y.o. white male with hypertension, COPD, coronary artery disease status post CABG, congestive heart failure, atrial fibrillation, AICD placement, CVA, parkinson's, hypothyroidism, diabetes mellitus type II, who [facility]    1. Acute renal failure on chronic kidney disease stage III: baseline creatinine of 1.23, GFR of 49.  Acute renal failure secondary to acute cardiorenal syndrome.  Chronic kidney disease secondary to diabetes and hypertension  Nonoliguric urine output. Creatinine  appears to be close to baseline Volume status acceptable. No further need for dialysis is anticipated.  May remove the dialysis catheter.  2. Hypertension: with acute exacerbation of congestive heart failure systolic and atrial fibrillation - amiodarone -Continue furosemide at home dose 40mg  PO daily.   3. Acute respiratory failure: (echo 11/6 with preserved systolic function).  Acute exacerbation of COPD.   - continue supportive care.  -Oxygen supplementation     LOS: 7 Troy [name] 11/13/20188:58 AM

## 2017-03-17 NOTE — Progress Notes (Signed)
Patient ID: Troy [name], male   DOB: 02-01-1924, 83 y.o.   MRN: [id number]   Troy [name] PROGRESS NOTE  Troy [name]:[id number] DOB: November 20, 1923 Troy [name] PCP: Troy Reichmann, MD  HPI/Subjective: Patient states he is having some difficulty in his bladder and back.  Still with some wheeze and cough.  Still feeling weak.  Objective: Vitals:   03/17/17 1257 03/17/17 1342  BP: (!) 142/74   Pulse: 88   Resp: 18   Temp: 98.1 F (36.7 C)   SpO2: 96% 93%    Filed Weights   03/13/17 1728 03/14/17 1540 03/14/17 1853  Weight: 117.7 kg (259 lb 7.7 oz) 116.2 kg (256 lb 2.8 oz) 112.4 kg (247 lb 12.8 oz)    ROS: Review of Systems  Constitutional: Negative for chills and fever.  Eyes: Negative for blurred vision.  Respiratory: Positive for cough, shortness of breath and wheezing.   Cardiovascular: Negative for chest pain.  Gastrointestinal: Negative for abdominal pain, constipation, diarrhea, nausea and vomiting.  Genitourinary: Negative for dysuria.  Musculoskeletal: Negative for joint pain.  Neurological: Positive for weakness. Negative for dizziness and headaches.   Exam: Physical Exam  Constitutional: He is oriented to person, place, and time.  HENT:  Nose: No mucosal edema.  Mouth/Throat: No oropharyngeal exudate or posterior oropharyngeal edema.  Eyes: Conjunctivae, EOM and lids are normal. Pupils are equal, round, and reactive to light.  Neck: No JVD present. Carotid bruit is not present. No edema present. No thyroid mass and no thyromegaly present.  Cardiovascular: S1 normal, S2 normal and normal heart sounds. Exam reveals no gallop.  No murmur heard. Pulses:      Dorsalis pedis pulses are 2+ on the right side, and 2+ on the left side.  Respiratory: No respiratory distress. He has decreased breath sounds in the right lower field and the left lower field. He has no wheezes. He has no rhonchi. He has rales in the left lower field.  GI: Soft. Bowel sounds  are normal. He exhibits distension. There is no tenderness.  Musculoskeletal:       Right wrist: He exhibits swelling.       Left wrist: He exhibits swelling.       Right ankle: He exhibits no swelling.       Left ankle: He exhibits no swelling.  Lymphadenopathy:    He has no cervical adenopathy.  Neurological: He is alert and oriented to person, place, and time. No cranial nerve deficit.  Skin: Skin is warm. Nails show no clubbing.  Left heel discoloration and softness.  Left ankle some erythematous macules which are blanching which are less prominent and less in amount today than yesterday.  Psychiatric: He has a normal mood and affect.      Data Reviewed: Basic Metabolic Panel: Recent Labs  Lab 03/13/17 1335 03/13/17 1924 03/14/17 0525 03/14/17 2217 03/15/17 0416 03/16/17 0011 03/16/17 0318 03/17/17 0536  NA 128* 130* 130* 136 137 136 136 135  K 4.8 4.4 5.8* 4.9 4.9 4.8 4.7 4.5  CL 88* 92* 94* 97* 98* 98* 97* 94*  CO2 25 25 24 28 29 30  32 31  GLUCOSE 231* 142* 263* 92 125* 160* 154* 217*  BUN 121* 97* 96* 51* 50* 42* 40* 35*  CREATININE 4.67* 3.82* 3.38* 1.66* 1.63* 1.52* 1.45* 1.32*  CALCIUM 7.3* 7.4* 7.7* 8.2* 8.3* 8.4* 8.4* 8.6*  MG  --  2.3 2.4 2.2 2.3 2.2  --   --   PHOS 7.7* 6.3*  6.9* 3.7 3.4  --   --   --    Liver Function Tests: Recent Labs  Lab 03/11/17 1026 03/13/17 1335 03/16/17 0011 03/16/17 0318  AST 35 20 16 17   ALT 8* 5* <5* 5*  ALKPHOS 78 67 60 58  BILITOT 0.9 0.7 1.3* 1.1  PROT 7.2 6.5 6.3* 6.4*  ALBUMIN 3.4* 3.3* 3.0* 3.1*   CBC: Recent Labs  Lab 03/11/17 1026 03/13/17 1335 03/14/17 0525 03/15/17 0416  WBC 24.1* 13.1* 11.1* 12.7*  HGB 13.0 12.1* 12.5* 12.6*  HCT 40.3 36.7* 38.6* 38.5*  MCV 92.3 91.0 91.4 91.8  PLT 183 165 152 159    CBG: Recent Labs  Lab 03/16/17 1158 03/16/17 1641 03/16/17 2115 03/17/17 0748 03/17/17 1147  GLUCAP 150* 153* 170* 232* 238*    Recent Results (from the past 240 hour(s))  MRSA PCR  Screening     Status: None   Collection Time: 03/10/17  1:43 PM  Result Value Ref Range Status   MRSA by PCR NEGATIVE NEGATIVE Final    Comment:        The GeneXpert MRSA Assay (FDA approved for NASAL specimens only), is one component of a comprehensive MRSA colonization surveillance program. It is not intended to diagnose MRSA infection nor to guide or monitor treatment for MRSA infections.       Scheduled Meds: . acetylcysteine  2 mL Nebulization BID  . amiodarone  200 mg Oral BID  . apixaban  2.5 mg Oral BID  . budesonide (PULMICORT) nebulizer solution  0.5 mg Nebulization BID  . calcium carbonate  1 tablet Oral BID  . carbidopa-levodopa  1 tablet Oral QID  . famotidine  40 mg Oral Daily  . feeding supplement (ENSURE ENLIVE)  237 mL Oral TID BM  . furosemide  40 mg Oral Daily  . gabapentin  100 mg Oral TID  . guaiFENesin  600 mg Oral BID  . insulin aspart  0-15 Units Subcutaneous TID WC  . insulin aspart  0-5 Units Subcutaneous QHS  . insulin glargine  20 Units Subcutaneous Daily  . ipratropium-albuterol  3 mL Nebulization Q6H  . mouth rinse  15 mL Mouth Rinse BID  . multivitamin  1 tablet Oral Daily  . multivitamin-lutein  1 capsule Oral Daily  . nystatin  5 mL Oral QID  . sodium chloride flush  3 mL Intravenous Q12H  . tamsulosin  0.4 mg Oral Daily   Continuous Infusions: . sodium chloride      Assessment/Plan:  1. Acute on chronic diastolic congestive heart failure with acute on chronic respiratory failure.  As per nephrology, discontinue dialysis catheter in the right neck.  Patient on oral Lasix.  Patient on 3 L nasal cannula.  Taper as able to do so. 2. Acute kidney injury on chronic kidney disease stage III.  Acute cardiorenal syndrome.  Temporary dialysis catheter in the right neck removed today.  Creatinine 1.32. 3. Hyperkalemia treated previously with dialysis 4. Atrial fibrillation with rapid ventricular rate.  Amiodarone drip switched to oral.   Restarted low-dose Eliquis. 5. Insulin-dependent diabetes mellitus with hyperglycemia.  Continue Lantus dose and just sliding scale for now.  Patient eating better today so sugars have been higher than previous. 6. COPD exacerbation off steroids.  Added Mucomyst nebulizers to try to break up upper airway wheeze.  Continue DuoNeb and budesonide nebulizers. 7. Parkinson's disease on Sinemet 8. Weakness.  Physical therapy recommended rehab 9. Diabetic neuropathy on gabapentin 10. Urinary retention.  Started Flomax  hopefully can get the Foley catheter out in a few more days.  Likely will need voiding trial at the rehab. 11. Discontinue Valtrex 12. Rash on the left lower extremity.  Sent off a hepatitis C which is still pending. 13. Heel decubiti difficult to determine stage but skin is intact.[name].  Heel protectors while in bed.  Will likely need podiatry as outpatient  Code Status:     Code Status Orders  (From admission, onward)        Start     Ordered   03/09/17 1937  Do not attempt resuscitation (DNR)  Continuous    Question Answer Comment  In the event of cardiac or respiratory ARREST Do not call a "code blue"   In the event of cardiac or respiratory ARREST Do not perform Intubation, CPR, defibrillation or ACLS   In the event of cardiac or respiratory ARREST Use medication by any route, position, wound care, and other measures to relive pain and suffering. May use oxygen, suction and manual treatment of airway obstruction as needed for comfort.      03/09/17 1936    Code Status History    Date Active Date Inactive Code Status Order ID Comments User Context   05/09/2016 15:37 05/13/2016 18:31 DNR [id number]  Katha HammingKonidena, Snehalatha, MD ED   03/19/2016 16:54 03/21/2016 18:26 DNR [id number]  Gracelyn NurseJohnston, John D, MD Inpatient   12/14/2015 12:09 12/16/2015 19:58 DNR [id number]  Altamese DillingVachhani, Vaibhavkumar, MD Inpatient   12/13/2015 20:55 12/14/2015 12:09 Full Code [id number]  Tonye RoyaltyHugelmeyer, Alexis, DO ED    12/13/2015 20:50 12/13/2015 20:55 DNR [id number]  Tonye RoyaltyHugelmeyer, Alexis, DO ED   12/07/2015 06:27 12/11/2015 17:06 DNR [id number]  Ihor AustinPyreddy, Pavan, MD ED   09/25/2015 01:18 09/28/2015 20:53 DNR [id number]  Oralia ManisWillis, David, MD Inpatient   08/07/2015 19:50 08/09/2015 15:23 DNR [id number]  Darrol JumpBarrett, Rhonda G, PA-C Inpatient   08/03/2015 16:58 08/07/2015 19:50 DNR [id number]  Houston SirenSainani, Vivek J, MD ED   02/25/2015 19:56 03/01/2015 19:21 Full Code [id number]  Gale JourneyWalsh, Catherine P, MD ED    Advance Directive Documentation     Most Recent Value  Type of Advance Directive  Living will  Pre-existing out of facility DNR order (yellow form or pink MOST form)  No data  "MOST" Form in Place?  No data     Family Communication: Wife today Disposition Plan:  will go out to rehab, potentially tomorrow  Consultants:  Critical care specialist  Nephrology  Procedures:  Temporary dialysis catheter in right neck  Dialysis catheter removal  Time spent: 25 minutes  Alford HighlandWIETING, Taylor Levick  [employer] [name]

## 2017-03-18 LAB — CBC
HCT: 38.1 % — ABNORMAL LOW (ref 40.0–52.0)
Hemoglobin: 12.4 g/dL — ABNORMAL LOW (ref 13.0–18.0)
MCH: 30.5 pg (ref 26.0–34.0)
MCHC: 32.6 g/dL (ref 32.0–36.0)
MCV: 93.5 fL (ref 80.0–100.0)
PLATELETS: 178 10*3/uL (ref 150–440)
RBC: 4.08 MIL/uL — AB (ref 4.40–5.90)
RDW: 14.8 % — ABNORMAL HIGH (ref 11.5–14.5)
WBC: 8.6 10*3/uL (ref 3.8–10.6)

## 2017-03-18 LAB — BASIC METABOLIC PANEL
Anion gap: 10 (ref 5–15)
BUN: 33 mg/dL — AB (ref 6–20)
CHLORIDE: 94 mmol/L — AB (ref 101–111)
CO2: 29 mmol/L (ref 22–32)
CREATININE: 1.27 mg/dL — AB (ref 0.61–1.24)
Calcium: 8.6 mg/dL — ABNORMAL LOW (ref 8.9–10.3)
GFR calc Af Amer: 55 mL/min — ABNORMAL LOW (ref >60)
GFR, EST NON AFRICAN AMERICAN: 48 mL/min — AB (ref >60)
GLUCOSE: 306 mg/dL — AB (ref 65–99)
POTASSIUM: 4.5 mmol/L (ref 3.5–5.1)
Sodium: 133 mmol/L — ABNORMAL LOW (ref 135–145)

## 2017-03-18 LAB — GLUCOSE, CAPILLARY
GLUCOSE-CAPILLARY: 318 mg/dL — AB (ref 65–99)
Glucose-Capillary: 296 mg/dL — ABNORMAL HIGH (ref 65–99)

## 2017-03-18 LAB — HEPATITIS C ANTIBODY: HCV Ab: <0.1 s/co ratio (ref 0.0–0.9)

## 2017-03-18 MED ORDER — OCUVITE-LUTEIN PO CAPS: 1.0000 | ORAL_CAPSULE | Freq: Every day | ORAL | 0 refills | Status: AC

## 2017-03-18 MED ORDER — BUDESONIDE 0.5 MG/2ML IN SUSP: 0.5000 mg | Freq: Two times a day (BID) | RESPIRATORY_TRACT | 12 refills | Status: AC

## 2017-03-18 MED ORDER — AMIODARONE HCL 200 MG PO TABS: 200.0000 mg | ORAL_TABLET | Freq: Two times a day (BID) | ORAL | Status: AC

## 2017-03-18 MED ORDER — ENSURE ENLIVE PO LIQD: 237.0000 mL | Freq: Three times a day (TID) | ORAL | 12 refills | Status: AC

## 2017-03-18 MED ORDER — CALCIUM CARBONATE ANTACID 500 MG PO CHEW: 1.0000 | CHEWABLE_TABLET | Freq: Two times a day (BID) | ORAL | Status: AC

## 2017-03-18 MED ORDER — FAMOTIDINE 40 MG PO TABS: 40.0000 mg | ORAL_TABLET | Freq: Every day | ORAL | Status: AC

## 2017-03-18 MED ORDER — TAMSULOSIN HCL 0.4 MG PO CAPS: 0.4000 mg | ORAL_CAPSULE | Freq: Every day | ORAL | Status: AC

## 2017-03-18 MED ORDER — HYDROCODONE-ACETAMINOPHEN 5-325 MG PO TABS: 1.0000 | ORAL_TABLET | Freq: Four times a day (QID) | ORAL | 0 refills | Status: DC | PRN

## 2017-03-18 NOTE — Progress Notes (Signed)
Pt discharged to [facility]. Pt transported via EMS. All discharge paperwork, prescription, and DNR given sent with EMS to facility. IV removed. All questions of pt and family answered to satisfaction.

## 2017-03-18 NOTE — Discharge Instructions (Signed)
Heart healthy and ADA diet. Continue O2 [place] 3L. Foley catheter out in a few more days.  Likely will need voiding trial at the rehab.

## 2017-03-18 NOTE — Progress Notes (Signed)
[facility]  ROUNDING NOTE   Subjective:   Patient denies any acute complaints Serum creatinine down to 1.27 States that he is able to eat without nausea or vomiting Foley still in place Patient's wife is at bedside   Objective:  Vital signs in last 24 hours:  Temp:  [98.3 F (36.8 C)-98.9 F (37.2 C)] 98.3 F (36.8 C) (11/14 1206) Pulse Rate:  [83-94] 84 (11/14 1206) Resp:  [20] 20 (11/14 1206) BP: (135-142)/(76-82) 135/76 (11/14 1206) SpO2:  [90 %-95 %] 95 % (11/14 1206)  Weight change:  Filed Weights   03/13/17 1728 03/14/17 1540 03/14/17 1853  Weight: 117.7 kg (259 lb 7.7 oz) 116.2 kg (256 lb 2.8 oz) 112.4 kg (247 lb 12.8 oz)    Intake/Output: I/O last 3 completed shifts: In: 480 [P.O.:480] Out: 2900 [Urine:2900]   Intake/Output this shift:  Total I/O In: 120 [P.O.:120] Out: -   Physical Exam: General:  No acute distress, laying in the bed  Head: Normocephalic, atraumatic. Moist oral mucosal membranes  Eyes: Anicteric,  Neck: Supple,   Lungs:   Clear to auscultation anteriorly and laterally  Heart: irregular  Abdomen:  Soft, nontender, obese   Extremities: trace peripheral edema.  Feet in bilateral soft boot support  Neurologic: Nonfocal, moving all four extremities        GU: foley    Basic Metabolic Panel: Recent Labs  Lab 03/13/17 1335 03/13/17 1924 03/14/17 0525 03/14/17 2217 03/15/17 0416 03/16/17 0011 03/16/17 0318 03/17/17 0536 03/18/17 0556  NA 128* 130* 130* 136 137 136 136 135 133*  K 4.8 4.4 5.8* 4.9 4.9 4.8 4.7 4.5 4.5  CL 88* 92* 94* 97* 98* 98* 97* 94* 94*  CO2 25 25 24 28 29 30 32 31 29   GLUCOSE 231* 142* 263* 92 125* 160* 154* 217* 306*  BUN 121* 97* 96* 51* 50* 42* 40* 35* 33*  CREATININE 4.67* 3.82* 3.38* 1.66* 1.63* 1.52* 1.45* 1.32* 1.27*  CALCIUM 7.3* 7.4* 7.7* 8.2* 8.3* 8.4* 8.4* 8.6* 8.6*  MG  --  2.3 2.4 2.2 2.3 2.2  --   --   --   PHOS 7.7* 6.3* 6.9* 3.7 3.4  --   --   --   --     Liver Function  Tests: Recent Labs  Lab 03/13/17 1335 03/16/17 0011 03/16/17 0318  AST 20 16 17   ALT 5* <5* 5*  ALKPHOS 67 60 58  BILITOT 0.7 1.3* 1.1  PROT 6.5 6.3* 6.4*  ALBUMIN 3.3* 3.0* 3.1*   No results for input(s): LIPASE, AMYLASE in the last 168 hours. No results for input(s): AMMONIA in the last 168 hours.  CBC: Recent Labs  Lab 03/13/17 1335 03/14/17 0525 03/15/17 0416 03/18/17 0556  WBC 13.1* 11.1* 12.7* 8.6  HGB 12.1* 12.5* 12.6* 12.4*  HCT 36.7* 38.6* 38.5* 38.1*  MCV 91.0 91.4 91.8 93.5  PLT 165 152 159 178    Cardiac Enzymes: No results for input(s): CKTOTAL, CKMB, CKMBINDEX, TROPONINI in the last 168 hours.  BNP: Invalid input(s): POCBNP  CBG: Recent Labs  Lab 03/17/17 1147 03/17/17 1630 03/17/17 2221 03/18/17 0730 03/18/17 1156  GLUCAP 238* 283* 328* 296* 318*    Microbiology: Results for orders placed or performed during the hospital encounter of 03/09/17  MRSA PCR Screening     Status: None   Collection Time: 03/10/17  1:43 PM  Result Value Ref Range Status   MRSA by PCR NEGATIVE NEGATIVE Final    Comment:  The GeneXpert MRSA Assay (FDA approved for NASAL specimens only), is one component of a comprehensive MRSA colonization surveillance program. It is not intended to diagnose MRSA infection nor to guide or monitor treatment for MRSA infections.     Coagulation Studies: No results for input(s): LABPROT, INR in the last 72 hours.  Urinalysis: No results for input(s): COLORURINE, LABSPEC, PHURINE, GLUCOSEU, HGBUR, BILIRUBINUR, KETONESUR, PROTEINUR, UROBILINOGEN, NITRITE, LEUKOCYTESUR in the last 72 hours.  Invalid input(s): APPERANCEUR    Imaging: No results found.   Medications:   . sodium chloride     . acetylcysteine  2 mL Nebulization BID  . amiodarone  200 mg Oral BID  . apixaban  2.5 mg Oral BID  . budesonide (PULMICORT) nebulizer solution  0.5 mg Nebulization BID  . calcium carbonate  1 tablet Oral BID  .  carbidopa-levodopa  1 tablet Oral QID  . famotidine  40 mg Oral Daily  . feeding supplement (ENSURE ENLIVE)  237 mL Oral TID BM  . furosemide  40 mg Oral Daily  . gabapentin  100 mg Oral TID  . guaiFENesin  600 mg Oral BID  . insulin aspart  0-15 Units Subcutaneous TID WC  . insulin aspart  0-5 Units Subcutaneous QHS  . insulin glargine  20 Units Subcutaneous Daily  . ipratropium-albuterol  3 mL Nebulization Q6H  . mouth rinse  15 mL Mouth Rinse BID  . multivitamin  1 tablet Oral Daily  . multivitamin-lutein  1 capsule Oral Daily  . nystatin  5 mL Oral QID  . sodium chloride flush  3 mL Intravenous Q12H  . tamsulosin  0.4 mg Oral Daily   sodium chloride, acetaminophen **OR** acetaminophen, albuterol, alum & mag hydroxide-simeth, dextrose, heparin, ondansetron **OR** ondansetron (ZOFRAN) IV, oxyCODONE, sodium chloride flush  Assessment/ Plan:  Mr. Dannielle BurnRobert S Mohammad is a 83 y.o. white male with hypertension, COPD, coronary artery disease status post CABG, congestive heart failure, atrial fibrillation, AICD placement, CVA, parkinson's, hypothyroidism, diabetes mellitus type II, who [facility]    1. Acute renal failure on chronic kidney disease stage III: baseline creatinine of 1.23, GFR of 49.  Acute renal failure secondary to acute cardiorenal syndrome.  Chronic kidney disease secondary to diabetes and hypertension  Creatinine is close to baseline.  Dialysis catheter has been removed We will follow-up with him as outpatient.  He is expected to be discharged to nursing home today  2. Hypertension: with acute exacerbation of congestive heart failure systolic and atrial fibrillation - amiodarone -Continue furosemide at home dose 40mg  PO daily.   3. Acute respiratory failure: (echo 11/6 with preserved systolic function).  Acute exacerbation of COPD.   - continue supportive care.  -Oxygen supplementation     LOS: 8 Jolena Kittle 11/14/20181:50 PM

## 2017-03-18 NOTE — Progress Notes (Signed)
Inpatient Diabetes Program Recommendations  AACE/ADA: New Consensus Statement on Inpatient Glycemic Control (2015)  Target Ranges:  Prepandial:   less than 140 mg/dL      Peak postprandial:   less than 180 mg/dL (1-2 hours)      Critically ill patients:  140 - 180 mg/dL   Results for Troy [name], Troy [name] (MRN [id number]) as of 03/18/2017 08:55  Ref. Range 03/17/2017 07:48 03/17/2017 11:47 03/17/2017 16:30 03/17/2017 22:21 03/18/2017 07:30  Glucose-Capillary Latest Ref Range: 65 - 99 mg/dL 409232 (H) 811238 (H) 914283 (H) 328 (H) 296 (H)    Review of Glycemic Control  Current orders for Inpatient glycemic control: Lantus 20 units Daily, Novolog 0-15 units TID with meals, Novolog 0-5 units QHS  Inpatient Diabetes Program Recommendations: Insulin - Basal: Please consider increasing Lantus to 22 units daily. Diet: If appropriate, please consider changing Ensure Enlive to Glucerna. Also, consider changing diet from Regular to Carb Modified.  Thanks, Orlando PennerMarie Ernestine Langworthy, RN, MSN, CDE Diabetes Coordinator Inpatient Diabetes Program [phone] (Team Pager from 8am to 5pm)

## 2017-03-18 NOTE — Discharge Summary (Signed)
[facility] [place] [facility]   PATIENT NAME: Troy [name]    MR#:  [id number]  DATE OF BIRTH:  1924-02-22  DATE OF ADMISSION:  03/09/2017   ADMITTING PHYSICIAN: Auburn BilberryShreyang Patel, MD  DATE OF DISCHARGE: 03/18/2017 PRIMARY CARE PHYSICIAN: Barbette ReichmannHande, Vishwanath, MD   ADMISSION DIAGNOSIS:  COPD exacerbation (HCC) [J44.1] Acute respiratory failure with hypoxia (HCC) [J96.01] Atrial fibrillation with RVR (HCC) [I48.91] Community acquired pneumonia, unspecified laterality [J18.9] DISCHARGE DIAGNOSIS:  Active Problems:   Acute respiratory failure (HCC)   Acute kidney injury (HCC)   Pressure injury of skin  SECONDARY DIAGNOSIS:   Past Medical History:  Diagnosis Date  . Chronic atrial fibrillation (HCC)    a. on Couamdin; b. CHADS2VASc at least 7 (CHF, HTN, age x 2, DM, stroke x 2); c. s/p MAZE 1999  . Chronic systolic CHF (congestive heart failure) (HCC)    a. echo 08/01/2015: EF of 45%, mild LVH, mitral valve with annular calcification, mitral valve partially mobile  . CKD (chronic kidney disease), stage III (HCC)   . COPD (chronic obstructive pulmonary disease) (HCC)   . Diabetes mellitus without complication (HCC)   . Hypertension   . Hypothyroidism   . Legally blind   . Mitral valve prolapse    a. s/p mitral valve repair 1999  . Normal coronary arteries    a. by cardiac cath 1999  . Parkinson's disease (HCC)   . Stroke (HCC)   . TIA (transient ischemic attack)   . UTI (lower urinary tract infection)    HOSPITAL COURSE:   1. Acute on chronic diastolic congestive heart failure with acute on chronic respiratory failure.  As per nephrology, discontinued dialysis catheter in the right neck.  Patient on oral Lasix.  Patient on 3 L nasal cannula.  Taper as able to do so. 2. Acute kidney injury on chronic kidney disease stage III.  Acute cardiorenal syndrome.  Temporary dialysis catheter in the right neck removed yesterday.  Creatinine  1.27. 3. Hyperkalemia treated previously with dialysis. Improved. 4. Atrial fibrillation with rapid ventricular rate.  Amiodarone drip switched to oral.  Restarted low-dose Eliquis. 5. Insulin-dependent diabetes mellitus with hyperglycemia.  Continue Lantus dose and just sliding scale for now.  6. COPD exacerbation off steroids.  Added Mucomyst nebulizers to try to break up upper airway wheeze.  Continue DuoNeb and budesonide nebulizers. 7. Parkinson's disease on Sinemet 8. Weakness.  Physical therapy recommended rehab 9. Diabetic neuropathy on gabapentin 10. Urinary retention.  Started Flomax hopefully can get the Foley catheter out in a few more days.  Likely will need voiding trial at the rehab. 11. Discontinued Valtrex 12. Rash on the left lower extremity.  Sent off a hepatitis C which is still pending. Follow up with PCP. 13. Heel decubiti difficult to determine stage but skin is intact.[name].  Heel protectors while in bed.  Will likely need podiatry as outpatient  DISCHARGE CONDITIONS:  Stable, discharge to SNF today. CONSULTS OBTAINED:  Treatment Team:  Lamont DowdyKolluru, Sarath, MD Erin FullingKasa, Kurian, MD DRUG ALLERGIES:  No Known Allergies DISCHARGE MEDICATIONS:   Allergies as of 03/18/2017   No Known Allergies     Medication List    STOP taking these medications   lisinopril 10 MG tablet Commonly known as:  PRINIVIL,ZESTRIL   predniSONE 10 MG (21) Tbpk tablet Commonly known as:  STERAPRED UNI-PAK 21 TAB   saxagliptin HCl 5 MG Tabs tablet Commonly known as:  ONGLYZA   valACYclovir 1000 MG tablet Commonly known  as:  VALTREX     TAKE these medications   albuterol 108 (90 Base) MCG/ACT inhaler Commonly known as:  PROVENTIL HFA;VENTOLIN HFA Inhale 2 puffs every 4 (four) hours as needed into the lungs for wheezing or shortness of breath.   albuterol (2.5 MG/3ML) 0.083% nebulizer solution Commonly known as:  PROVENTIL Take 3 mLs (2.5 mg total) by nebulization every 6 (six) hours as  needed for wheezing or shortness of breath.   amiodarone 200 MG tablet Commonly known as:  PACERONE Take 1 tablet (200 mg total) 2 (two) times daily by mouth.   budesonide 0.5 MG/2ML nebulizer solution Commonly known as:  PULMICORT Take 2 mLs (0.5 mg total) 2 (two) times daily by nebulization.   calcium carbonate 500 MG chewable tablet Commonly known as:  TUMS - dosed in mg elemental calcium Chew 1 tablet (200 mg of elemental calcium total) 2 (two) times daily by mouth.   carbidopa-levodopa 25-250 MG tablet Commonly known as:  SINEMET IR Take 1 tablet by mouth 4 (four) times daily.   cetirizine 10 MG tablet Commonly known as:  ZYRTEC Take 10 mg by mouth daily.   ELIQUIS 2.5 MG Tabs tablet Generic drug:  apixaban Take 2.5 mg 2 (two) times daily by mouth.   famotidine 40 MG tablet Commonly known as:  PEPCID Take 1 tablet (40 mg total) daily by mouth.   feeding supplement (ENSURE ENLIVE) Liqd Take 237 mLs 3 (three) times daily between meals by mouth.   fluticasone-salmeterol 115-21 MCG/ACT inhaler Commonly known as:  ADVAIR HFA Inhale 2 puffs into the lungs 2 (two) times daily.   furosemide 40 MG tablet Commonly known as:  LASIX Take 1 tablet (40 mg total) by mouth daily.   gabapentin 100 MG capsule Commonly known as:  NEURONTIN Take 100 mg 3 (three) times daily by mouth.   glipiZIDE 10 MG tablet Commonly known as:  GLUCOTROL Take 10 mg by mouth daily before breakfast.   HYDROcodone-acetaminophen 5-325 MG tablet Commonly known as:  NORCO/VICODIN Take 1 tablet every 6 (six) hours as needed by mouth for moderate pain.   insulin aspart 100 UNIT/ML injection Commonly known as:  novoLOG Inject 0-5 Units into the skin at bedtime.   insulin glargine 100 UNIT/ML injection Commonly known as:  LANTUS Inject 0.32 mLs (32 Units total) into the skin daily. What changed:    how much to take  when to take this   insulin regular 100 units/mL injection Commonly known  as:  NOVOLIN R,HUMULIN R Inject 15-25 Units 2 (two) times daily before a meal into the skin.   metroNIDAZOLE 0.75 % gel Commonly known as:  METROGEL Apply 1 application topically daily.   multivitamin-lutein Caps capsule Take 1 capsule daily by mouth.   potassium chloride 10 MEQ CR capsule Commonly known as:  MICRO-K Take 10 mEq by mouth daily.   tamsulosin 0.4 MG Caps capsule Commonly known as:  FLOMAX Take 1 capsule (0.4 mg total) daily by mouth.        DISCHARGE INSTRUCTIONS:  See AVS.  If you experience worsening of your admission symptoms, develop shortness of breath, life threatening emergency, suicidal or homicidal thoughts you must seek medical attention immediately by calling 911 or calling your MD immediately  if symptoms less severe.  You Must read complete instructions/literature along with all the possible adverse reactions/side effects for all the Medicines you take and that have been prescribed to you. Take any new Medicines after you have completely understood and accpet  all the possible adverse reactions/side effects.   Please note  You were cared for by a hospitalist during your hospital stay. If you have any questions about your discharge medications or the care you received while you were in the hospital after you are discharged, you can call the unit and asked to speak with the hospitalist on call if the hospitalist that took care of you is not available. Once you are discharged, your primary care physician will handle any further medical issues. Please note that NO REFILLS for any discharge medications will be authorized once you are discharged, as it is imperative that you return to your primary care physician (or establish a relationship with a primary care physician if you do not have one) for your aftercare needs so that they can reassess your need for medications and monitor your lab values.    On the day of Discharge:  VITAL SIGNS:  Blood pressure (!)  142/76, pulse 83, temperature 98.9 F (37.2 C), temperature source Oral, resp. rate 20, height 5' 9.5" (1.765 m), weight 247 lb 12.8 oz (112.4 kg), SpO2 94 %. PHYSICAL EXAMINATION:  GENERAL:  83 y.o.-year-old patient lying in the bed with no acute distress.  EYES: Pupils equal, round, reactive to light and accommodation. No scleral icterus. Extraocular muscles intact.  HEENT: Head atraumatic, normocephalic. Oropharynx and nasopharynx clear.  NECK:  Supple, no jugular venous distention. No thyroid enlargement, no tenderness.  LUNGS: Normal breath sounds bilaterally, no wheezing, rales,rhonchi or crepitation. No use of accessory muscles of respiration.  CARDIOVASCULAR: S1, S2 normal. No murmurs, rubs, or gallops.  ABDOMEN: Soft, non-tender, non-distended. Bowel sounds present. No organomegaly or mass.  EXTREMITIES: No pedal edema, cyanosis, or clubbing.  NEUROLOGIC: Cranial nerves II through XII are intact. Muscle strength 5/5 in all extremities. Sensation intact. Gait not checked.  PSYCHIATRIC: The patient is alert and oriented x 3.  SKIN: No obvious rash, lesion, or ulcer.  DATA REVIEW:   CBC Recent Labs  Lab 03/18/17 0556  WBC 8.6  HGB 12.4*  HCT 38.1*  PLT 178    Chemistries  Recent Labs  Lab 03/16/17 0011 03/16/17 0318  03/18/17 0556  NA 136 136   < > 133*  K 4.8 4.7   < > 4.5  CL 98* 97*   < > 94*  CO2 30 32   < > 29  GLUCOSE 160* 154*   < > 306*  BUN 42* 40*   < > 33*  CREATININE 1.52* 1.45*   < > 1.27*  CALCIUM 8.4* 8.4*   < > 8.6*  MG 2.2  --   --   --   AST 16 17  --   --   ALT <5* 5*  --   --   ALKPHOS 60 58  --   --   BILITOT 1.3* 1.1  --   --    < > = values in this interval not displayed.     Microbiology Results  Results for orders placed or performed during the hospital encounter of 03/09/17  MRSA PCR Screening     Status: None   Collection Time: 03/10/17  1:43 PM  Result Value Ref Range Status   MRSA by PCR NEGATIVE NEGATIVE Final    Comment:         The GeneXpert MRSA Assay (FDA approved for NASAL specimens only), is one component of a comprehensive MRSA colonization surveillance program. It is not intended to diagnose MRSA infection nor to guide  or monitor treatment for MRSA infections.     RADIOLOGY:  No results found.   Management plans discussed with the patient, his wife and they are in agreement.  CODE STATUS: DNR   TOTAL TIME TAKING CARE OF THIS PATIENT: 36 minutes.    Shaune Pollack M.D on 03/18/2017 at 9:33 AM  Between 7am to 6pm - Pager - [phone]  After 6pm go to [email or web address] - Social research officer, government  [facility]  Office  [phone]  CC: Primary care physician; Barbette Reichmann, MD   Note: This dictation was prepared with Dragon dictation along with smaller phrase technology. Any transcriptional errors that result from this process are unintentional.

## 2017-03-18 NOTE — Clinical Social Work Placement (Signed)
   CLINICAL SOCIAL WORK PLACEMENT  NOTE  Date:  03/18/2017  Patient Details  Name: Troy BurnRobert S Terrill MRN: [id number] Date of Birth: 09/04/1923  Clinical Social Work is seeking post-discharge placement for this patient at the Skilled  Nursing Facility level of care (*CSW will initial, date and re-position this form in  chart as items are completed):  Yes   Patient/family provided with [place] Clinical Social Work Department's list of facilities offering this level of care within the geographic area requested by the patient (or if unable, by the patient's family).  Yes   Patient/family informed of their freedom to choose among providers that offer the needed level of care, that participate in Medicare, Medicaid or managed care program needed by the patient, have an available bed and are willing to accept the patient.  Yes   Patient/family informed of [facility]'s ownership interest in [facility] and [facility], as well as of the fact that they are under no obligation to receive care at these facilities.  PASRR submitted to EDS on       PASRR number received on       Existing PASRR number confirmed on 03/15/17     FL2 transmitted to all facilities in geographic area requested by pt/family on 03/15/17     FL2 transmitted to all facilities within larger geographic area on       Patient informed that his/her managed care company has contracts with or will negotiate with certain facilities, including the following:        Yes   Patient/family informed of bed offers received.  Patient chooses bed at [facility]([place])     Physician recommends and patient chooses bed at [facility](SNF)    Patient to be transferred to [facility] on 03/18/17.  Patient to be transferred to facility by (EMS)     Patient family notified on 03/18/17 of transfer.  Name of family member notified:  wife     PHYSICIAN       Additional Comment:    _______________________________________________ York SpanielMonica  Isak Sotomayor, LCSW 03/18/2017, 10:13 AM

## 2017-03-18 NOTE — Progress Notes (Signed)
Called for EMS transport. [facility] [employer] [facility] [facility].

## 2017-04-22 ENCOUNTER — Encounter (INDEPENDENT_AMBULATORY_CARE_PROVIDER_SITE_OTHER): Payer: Self-pay | Admitting: Vascular Surgery

## 2017-04-22 ENCOUNTER — Ambulatory Visit (INDEPENDENT_AMBULATORY_CARE_PROVIDER_SITE_OTHER): Payer: Medicare Other | Admitting: Vascular Surgery

## 2017-04-22 VITALS — BP 126/69 | HR 85 | Resp 19 | Ht 69.5 in | Wt 253.0 lb

## 2017-04-22 DIAGNOSIS — Z794 Long term (current) use of insulin: Secondary | ICD-10-CM

## 2017-04-22 DIAGNOSIS — L89629 Pressure ulcer of left heel, unspecified stage: Secondary | ICD-10-CM | POA: Diagnosis not present

## 2017-04-22 DIAGNOSIS — E119 Type 2 diabetes mellitus without complications: Secondary | ICD-10-CM

## 2017-04-22 DIAGNOSIS — I10 Essential (primary) hypertension: Secondary | ICD-10-CM | POA: Diagnosis not present

## 2017-04-22 NOTE — Progress Notes (Signed)
Subjective:    Patient ID: Troy [name], male    DOB: 03/07/1924, 83 y.o.   MRN: [id number] Chief Complaint  Patient presents with  . New Patient (Initial Visit)    Arterial studies with ABI   Presents as a new patient referred by Dr. Maryellen PileEason for evaluation of peripheral artery disease.  The patient was recently hospitalized the beginning of November 2018 for approximately 10 days and developed a pressure ulcer to the left heel.  Seen with his wife who is a very good advocate for the patient.  The patient has been non-ambulatory for about a month as per the wife.  Prior the patient did not ambulate much.  The patient denies any bilateral lower extremity claudication when he did ambulate, rest pain or new/worsening ulceration.  The patient has been receiving wound care at his nursing residence.  The wife states she is being told by the nursing home that his left heel ulceration is "healing".  The patient denies any fever, nausea vomiting.   Review of Systems  Constitutional: Negative.   HENT: Negative.   Eyes: Negative.   Respiratory: Negative.   Cardiovascular: Negative.   Gastrointestinal: Negative.   Endocrine: Negative.   Genitourinary: Negative.   Musculoskeletal: Negative.   Skin: Positive for wound.  Allergic/Immunologic: Negative.   Neurological: Negative.   Hematological: Negative.   Psychiatric/Behavioral: Negative.       Objective:   Physical Exam  Constitutional: He is oriented to person, place, and time. He appears well-developed and well-nourished. No distress.  HENT:  Head: Normocephalic and atraumatic.  Eyes: Conjunctivae are normal. Pupils are equal, round, and reactive to light.  Neck: Normal range of motion.  Cardiovascular: Normal rate, regular rhythm, normal heart sounds and intact distal pulses.  Pulses:      Radial pulses are 2+ on the right side, and 2+ on the left side.  Hard to palpate pedal pulses bilaterally however his bilateral feet are warm.    Pulmonary/Chest: Effort normal and breath sounds normal.  Musculoskeletal: Normal range of motion. He exhibits edema (Mild to moderate nonpitting edema noted bilaterally).  Neurological: He is alert and oriented to person, place, and time.  Skin: He is not diaphoretic.  There is a 4 cm x 4 cm circular wound to the left heel.  The wound bed is noted to have granulation tissue.  There is no foul odor.  There is no drainage.  There is no surrounding cellulitis.  The surrounding skin is healthy.  Psychiatric: He has a normal mood and affect. His behavior is normal. Judgment and thought content normal.  Vitals reviewed.  BP 126/69 (BP Location: Right Arm, Patient Position: Sitting)   Pulse 85   Resp 19   Ht 5' 9.5" (1.765 m)   Wt 253 lb (114.8 kg)   BMI 36.83 kg/m   Past Medical History:  Diagnosis Date  . Chronic atrial fibrillation (HCC)    a. on Couamdin; b. CHADS2VASc at least 7 (CHF, HTN, age x 2, DM, stroke x 2); c. s/p MAZE 1999  . Chronic systolic CHF (congestive heart failure) (HCC)    a. echo 08/01/2015: EF of 45%, mild LVH, mitral valve with annular calcification, mitral valve partially mobile  . CKD (chronic kidney disease), stage III (HCC)   . COPD (chronic obstructive pulmonary disease) (HCC)   . Diabetes mellitus without complication (HCC)   . Hypertension   . Hypothyroidism   . Legally blind   . Mitral valve prolapse  a. s/p mitral valve repair 1999  . Normal coronary arteries    a. by cardiac cath 1999  . Parkinson's disease (HCC)   . Stroke (HCC)   . TIA (transient ischemic attack)   . UTI (lower urinary tract infection)    Social History   Socioeconomic History  . Marital status: Married    Spouse name: Not on file  . Number of children: Not on file  . Years of education: Not on file  . Highest education level: Not on file  Social Needs  . Financial resource strain: Not on file  . Food insecurity - worry: Not on file  . Food insecurity - inability: Not  on file  . Transportation needs - medical: Not on file  . Transportation needs - non-medical: Not on file  Occupational History  . Occupation: retired  Tobacco Use  . Smoking status: Former Smoker    Packs/day: 4.00    Years: 20.00    Pack years: 80.00    Types: Cigarettes  . Smokeless tobacco: Never Used  Substance and Sexual Activity  . Alcohol use: No    Alcohol/week: 0.0 oz  . Drug use: No  . Sexual activity: Not on file  Other Topics Concern  . Not on file  Social History Narrative  . Not on file   Past Surgical History:  Procedure Laterality Date  . CARDIAC CATHETERIZATION N/A 08/08/2015   Procedure: Temporary Pacemaker;  Surgeon: Peter M Swaziland, MD;  Location: [facility] INVASIVE CV LAB;  Service: Cardiovascular;  Laterality: N/A;  . CARDIAC SURGERY    . CHOLECYSTECTOMY    . EP IMPLANTABLE DEVICE N/A 08/08/2015   Procedure: Pacemaker Implant;  Surgeon: Will Jorja Loa, MD;  Location: [facility] INVASIVE CV LAB;  Service: Cardiovascular;  Laterality: N/A;   Family History  Problem Relation Age of Onset  . Hypertension Mother   . Stroke Mother   . CAD Father   . Lymphoma Sister   . Lung disease Brother    No Known Allergies     Assessment & Plan:  Presents as a new patient referred by Dr. Maryellen Pile for evaluation of peripheral artery disease.  The patient was recently hospitalized the beginning of November 2018 for approximately 10 days and developed a pressure ulcer to the left heel.  Seen with his wife who is a very good advocate for the patient.  The patient has been non-ambulatory for about a month as per the wife.  Prior the patient did not ambulate much.  The patient denies any bilateral lower extremity claudication when he did ambulate, rest pain or new/worsening ulceration.  The patient has been receiving wound care at his nursing residence.  The wife states she is being told by the nursing home that his left heel ulceration is "healing".  The patient denies any fever, nausea  vomiting.  1. Pressure injury of skin of left heel, unspecified injury stage - New Patient with a left heel pressure ulceration sustained during a 10-day inpatient stay at [facility] The patient has been receiving local wound care from a wound doctor who visits his nursing home The patient has multiple risk factors for peripheral artery disease I am unable to palpate pedal pulses bilaterally I would like the patient undergo an bilateral ABI and left lower extremity arterial duplex At this time, the patient is very hesitant to move forward with these tests. I had a long discussion about each test what we would be looking for (atherosclerotic disease /  arterial blood flow /ability to heal his wound).  At this time, the patient is refusing to move forward.  If he changes his mind he will call the office  - VAS US ABI WITH/WO TBI; Future - VAS US LOWER EXTREMITY ARTERIAL DUPLEX; Future  2. Type 2 diabetes mellitus without complication, with long-term current use of insulin (HCC)  - Stable Encouraged good control as its slows the progression of atherosclerotic disease and impede wound healing  3. Essential hypertension - Stable Encouraged good control as its slows the progression of atherosclerotic disease  Current Outpatient Medications on File Prior to Visit  Medication Sig Dispense Refill  . albuterol (PROVENTIL HFA;VENTOLIN HFA) 108 (90 Base) MCG/ACT inhaler Inhale 2 puffs every 4 (four) hours as needed into the lungs for wheezing or shortness of breath.    [name]. albuterol (PROVENTIL) (2.5 MG/3ML) 0.083% nebulizer solution Take 3 mLs (2.5 mg total) by nebulization every 6 (six) hours as needed for wheezing or shortness of breath. 75 mL 12  . amiodarone (PACERONE) 200 MG tablet Take 1 tablet (200 mg total) 2 (two) times daily by mouth.    [name]. apixaban (ELIQUIS) 2.5 MG TABS tablet Take 2.5 mg 2 (two) times daily by mouth.    . budesonide (PULMICORT) 0.5 MG/2ML nebulizer  solution Take 2 mLs (0.5 mg total) 2 (two) times daily by nebulization.  12  . calcium carbonate (TUMS - DOSED IN MG ELEMENTAL CALCIUM) 500 MG chewable tablet Chew 1 tablet (200 mg of elemental calcium total) 2 (two) times daily by mouth.    . carbidopa-levodopa (SINEMET IR) 25-250 MG tablet Take 1 tablet by mouth 4 (four) times daily.     . cetirizine (ZYRTEC) 10 MG tablet Take 10 mg by mouth daily.    . famotidine (PEPCID) 40 MG tablet Take 1 tablet (40 mg total) daily by mouth.    . feeding supplement, ENSURE ENLIVE, (ENSURE ENLIVE) LIQD Take 237 mLs 3 (three) times daily between meals by mouth. 237 mL 12  . fluticasone-salmeterol (ADVAIR HFA) 115-21 MCG/ACT inhaler Inhale 2 puffs into the lungs 2 (two) times daily. 1 Inhaler 0  . furosemide (LASIX) 40 MG tablet Take 1 tablet (40 mg total) by mouth daily. 30 tablet 6  . gabapentin (NEURONTIN) 100 MG capsule Take 100 mg 3 (three) times daily by mouth.    [name]. glipiZIDE (GLUCOTROL) 10 MG tablet Take 10 mg by mouth daily before breakfast.    . HYDROcodone-acetaminophen (NORCO/VICODIN) 5-325 MG tablet Take 1 tablet every 6 (six) hours as needed by mouth for moderate pain. 12 tablet 0  . insulin regular (NOVOLIN R,HUMULIN R) 100 units/mL injection Inject 15-25 Units 2 (two) times daily before a meal into the skin.     [name]. metroNIDAZOLE (METROGEL) 0.75 % gel Apply 1 application topically daily.    . multivitamin-lutein (OCUVITE-LUTEIN) CAPS capsule Take 1 capsule daily by mouth.  0  . potassium chloride (MICRO-K) 10 MEQ CR capsule Take 10 mEq by mouth daily.    . predniSONE (DELTASONE) 5 MG tablet Take by mouth.    . tamsulosin (FLOMAX) 0.4 MG CAPS capsule Take 1 capsule (0.4 mg total) daily by mouth. 30 capsule   . insulin aspart (NOVOLOG) 100 UNIT/ML injection Inject 0-5 Units into the skin at bedtime. (Patient not taking: Reported on 03/09/2017) 10 mL 11  . insulin glargine (LANTUS) 100 UNIT/ML injection Inject 0.32 mLs (32 Units total) into the skin  daily. (Patient not taking: Reported on 04/22/2017) 10 mL 11  .  saxagliptin HCl (ONGLYZA) 5 MG TABS tablet TAKE 1 TABLET BY MOUTH DAILY     No current facility-administered medications on file prior to visit.    There are no Patient Instructions on file for this visit. No Follow-up on file.  Derita Michelsen A Mina Babula, PA-C

## 2017-04-25 ENCOUNTER — Emergency Department: Payer: Medicare Other

## 2017-04-25 ENCOUNTER — Other Ambulatory Visit: Payer: Self-pay

## 2017-04-25 ENCOUNTER — Inpatient Hospital Stay
Admission: EM | Admit: 2017-04-25 | Discharge: 2017-04-30 | DRG: 291 | Disposition: A | Discharge status: Skilled Nursing Facility | Payer: Medicare Other | Attending: Internal Medicine | Admitting: Internal Medicine

## 2017-04-25 ENCOUNTER — Encounter: Payer: Self-pay | Admitting: Emergency Medicine

## 2017-04-25 DIAGNOSIS — Z8673 Personal history of transient ischemic attack (TIA), and cerebral infarction without residual deficits: Secondary | ICD-10-CM

## 2017-04-25 DIAGNOSIS — J810 Acute pulmonary edema: Secondary | ICD-10-CM | POA: Diagnosis not present

## 2017-04-25 DIAGNOSIS — Z9981 Dependence on supplemental oxygen: Secondary | ICD-10-CM | POA: Diagnosis not present

## 2017-04-25 DIAGNOSIS — Z7901 Long term (current) use of anticoagulants: Secondary | ICD-10-CM | POA: Diagnosis not present

## 2017-04-25 DIAGNOSIS — H548 Legal blindness, as defined in USA: Secondary | ICD-10-CM | POA: Diagnosis present

## 2017-04-25 DIAGNOSIS — J9621 Acute and chronic respiratory failure with hypoxia: Secondary | ICD-10-CM

## 2017-04-25 DIAGNOSIS — Z950 Presence of cardiac pacemaker: Secondary | ICD-10-CM

## 2017-04-25 DIAGNOSIS — J9690 Respiratory failure, unspecified, unspecified whether with hypoxia or hypercapnia: Secondary | ICD-10-CM

## 2017-04-25 DIAGNOSIS — R0602 Shortness of breath: Secondary | ICD-10-CM | POA: Diagnosis present

## 2017-04-25 DIAGNOSIS — I482 Chronic atrial fibrillation: Secondary | ICD-10-CM | POA: Diagnosis present

## 2017-04-25 DIAGNOSIS — Z66 Do not resuscitate: Secondary | ICD-10-CM | POA: Diagnosis present

## 2017-04-25 DIAGNOSIS — Z823 Family history of stroke: Secondary | ICD-10-CM | POA: Diagnosis not present

## 2017-04-25 DIAGNOSIS — Z7951 Long term (current) use of inhaled steroids: Secondary | ICD-10-CM

## 2017-04-25 DIAGNOSIS — G20 Parkinson's disease: Secondary | ICD-10-CM | POA: Diagnosis present

## 2017-04-25 DIAGNOSIS — N179 Acute kidney failure, unspecified: Secondary | ICD-10-CM | POA: Diagnosis present

## 2017-04-25 DIAGNOSIS — Z7952 Long term (current) use of systemic steroids: Secondary | ICD-10-CM | POA: Diagnosis not present

## 2017-04-25 DIAGNOSIS — Z8249 Family history of ischemic heart disease and other diseases of the circulatory system: Secondary | ICD-10-CM

## 2017-04-25 DIAGNOSIS — Z9049 Acquired absence of other specified parts of digestive tract: Secondary | ICD-10-CM | POA: Diagnosis not present

## 2017-04-25 DIAGNOSIS — Z794 Long term (current) use of insulin: Secondary | ICD-10-CM | POA: Diagnosis not present

## 2017-04-25 DIAGNOSIS — D649 Anemia, unspecified: Secondary | ICD-10-CM | POA: Diagnosis present

## 2017-04-25 DIAGNOSIS — R0902 Hypoxemia: Secondary | ICD-10-CM

## 2017-04-25 DIAGNOSIS — I130 Hypertensive heart and chronic kidney disease with heart failure and stage 1 through stage 4 chronic kidney disease, or unspecified chronic kidney disease: Secondary | ICD-10-CM | POA: Diagnosis present

## 2017-04-25 DIAGNOSIS — E039 Hypothyroidism, unspecified: Secondary | ICD-10-CM | POA: Diagnosis present

## 2017-04-25 DIAGNOSIS — J189 Pneumonia, unspecified organism: Secondary | ICD-10-CM | POA: Diagnosis present

## 2017-04-25 DIAGNOSIS — I509 Heart failure, unspecified: Secondary | ICD-10-CM

## 2017-04-25 DIAGNOSIS — E1122 Type 2 diabetes mellitus with diabetic chronic kidney disease: Secondary | ICD-10-CM | POA: Diagnosis present

## 2017-04-25 DIAGNOSIS — J440 Chronic obstructive pulmonary disease with acute lower respiratory infection: Secondary | ICD-10-CM | POA: Diagnosis present

## 2017-04-25 DIAGNOSIS — I5043 Acute on chronic combined systolic (congestive) and diastolic (congestive) heart failure: Secondary | ICD-10-CM | POA: Diagnosis present

## 2017-04-25 DIAGNOSIS — N183 Chronic kidney disease, stage 3 (moderate): Secondary | ICD-10-CM | POA: Diagnosis present

## 2017-04-25 DIAGNOSIS — E118 Type 2 diabetes mellitus with unspecified complications: Secondary | ICD-10-CM | POA: Diagnosis present

## 2017-04-25 DIAGNOSIS — Z807 Family history of other malignant neoplasms of lymphoid, hematopoietic and related tissues: Secondary | ICD-10-CM | POA: Diagnosis not present

## 2017-04-25 DIAGNOSIS — J9600 Acute respiratory failure, unspecified whether with hypoxia or hypercapnia: Secondary | ICD-10-CM

## 2017-04-25 DIAGNOSIS — I5021 Acute systolic (congestive) heart failure: Secondary | ICD-10-CM

## 2017-04-25 DIAGNOSIS — L89622 Pressure ulcer of left heel, stage 2: Secondary | ICD-10-CM | POA: Diagnosis present

## 2017-04-25 DIAGNOSIS — I5031 Acute diastolic (congestive) heart failure: Secondary | ICD-10-CM | POA: Diagnosis not present

## 2017-04-25 DIAGNOSIS — J9601 Acute respiratory failure with hypoxia: Secondary | ICD-10-CM | POA: Diagnosis not present

## 2017-04-25 LAB — COMPREHENSIVE METABOLIC PANEL
ALT: <5 U/L — ABNORMAL LOW (ref 17–63)
ANION GAP: 12 (ref 5–15)
AST: 17 U/L (ref 15–41)
Albumin: 3.3 g/dL — ABNORMAL LOW (ref 3.5–5.0)
Alkaline Phosphatase: 76 U/L (ref 38–126)
BILIRUBIN TOTAL: 1.5 mg/dL — AB (ref 0.3–1.2)
BUN: 28 mg/dL — ABNORMAL HIGH (ref 6–20)
CHLORIDE: 95 mmol/L — AB (ref 101–111)
CO2: 32 mmol/L (ref 22–32)
Calcium: 8.7 mg/dL — ABNORMAL LOW (ref 8.9–10.3)
Creatinine, Ser: 2.08 mg/dL — ABNORMAL HIGH (ref 0.61–1.24)
GFR, EST AFRICAN AMERICAN: 30 mL/min — AB (ref >60)
GFR, EST NON AFRICAN AMERICAN: 26 mL/min — AB (ref >60)
Glucose, Bld: 191 mg/dL — ABNORMAL HIGH (ref 65–99)
POTASSIUM: 3.8 mmol/L (ref 3.5–5.1)
Sodium: 139 mmol/L (ref 135–145)
TOTAL PROTEIN: 7.2 g/dL (ref 6.5–8.1)

## 2017-04-25 LAB — CBC WITH DIFFERENTIAL/PLATELET
BASOS ABS: 0.1 10*3/uL (ref 0–0.1)
BASOS PCT: 1 %
Eosinophils Absolute: 0.6 10*3/uL (ref 0–0.7)
Eosinophils Relative: 9 %
HEMATOCRIT: 35.9 % — AB (ref 40.0–52.0)
HEMOGLOBIN: 11.6 g/dL — AB (ref 13.0–18.0)
LYMPHS PCT: 17 %
Lymphs Abs: 1.2 10*3/uL (ref 1.0–3.6)
MCH: 30.7 pg (ref 26.0–34.0)
MCHC: 32.5 g/dL (ref 32.0–36.0)
MCV: 94.5 fL (ref 80.0–100.0)
MONO ABS: 0.7 10*3/uL (ref 0.2–1.0)
Monocytes Relative: 11 %
NEUTROS ABS: 4.2 10*3/uL (ref 1.4–6.5)
NEUTROS PCT: 62 %
Platelets: 248 10*3/uL (ref 150–440)
RBC: 3.79 MIL/uL — ABNORMAL LOW (ref 4.40–5.90)
RDW: 16.6 % — AB (ref 11.5–14.5)
WBC: 6.8 10*3/uL (ref 3.8–10.6)

## 2017-04-25 LAB — TROPONIN I: Troponin I: <0.03 ng/mL (ref <0.03)

## 2017-04-25 LAB — GLUCOSE, CAPILLARY
GLUCOSE-CAPILLARY: 158 mg/dL — AB (ref 65–99)
Glucose-Capillary: 141 mg/dL — ABNORMAL HIGH (ref 65–99)

## 2017-04-25 LAB — MRSA PCR SCREENING: MRSA BY PCR: NEGATIVE

## 2017-04-25 LAB — APTT: APTT: 59 s — AB (ref 24–36)

## 2017-04-25 LAB — BRAIN NATRIURETIC PEPTIDE: B Natriuretic Peptide: 146 pg/mL — ABNORMAL HIGH (ref 0.0–100.0)

## 2017-04-25 LAB — INFLUENZA PANEL BY PCR (TYPE A & B)
INFLAPCR: NEGATIVE
INFLBPCR: NEGATIVE

## 2017-04-25 MED ORDER — ONDANSETRON HCL 4 MG/2ML IJ SOLN: 4.0000 mg | Freq: Four times a day (QID) | INTRAMUSCULAR | Status: DC | PRN

## 2017-04-25 MED ORDER — PANTOPRAZOLE SODIUM 40 MG IV SOLR
INTRAVENOUS | Status: AC
  Filled 2017-04-25: qty 40

## 2017-04-25 MED ORDER — PANTOPRAZOLE SODIUM 40 MG IV SOLR: 40.0000 mg | INTRAVENOUS | Status: DC

## 2017-04-25 MED ORDER — INSULIN ASPART 100 UNIT/ML ~~LOC~~ SOLN
0.0000 [IU] | Freq: Three times a day (TID) | SUBCUTANEOUS | Status: DC
  Administered 2017-04-26 – 2017-04-27 (×2): 1 [IU] via SUBCUTANEOUS
  Administered 2017-04-27 – 2017-04-29 (×5): 2 [IU] via SUBCUTANEOUS
  Administered 2017-04-29 (×2): 3 [IU] via SUBCUTANEOUS
  Administered 2017-04-30: 2 [IU] via SUBCUTANEOUS
  Administered 2017-04-30: 3 [IU] via SUBCUTANEOUS
  Filled 2017-04-25 (×12): qty 1

## 2017-04-25 MED ORDER — SODIUM CHLORIDE 0.9% FLUSH: 3.0000 mL | INTRAVENOUS | Status: DC | PRN

## 2017-04-25 MED ORDER — ACETAMINOPHEN 325 MG PO TABS: 650.0000 mg | ORAL_TABLET | ORAL | Status: DC | PRN

## 2017-04-25 MED ORDER — SODIUM CHLORIDE 0.9 % IV SOLN: 250.0000 mL | INTRAVENOUS | Status: DC | PRN

## 2017-04-25 MED ORDER — PANTOPRAZOLE SODIUM 40 MG IV SOLR
40.0000 mg | INTRAVENOUS | Status: DC
  Administered 2017-04-25 – 2017-04-26 (×2): 40 mg via INTRAVENOUS
  Filled 2017-04-25 (×2): qty 40

## 2017-04-25 MED ORDER — BUDESONIDE 0.5 MG/2ML IN SUSP
0.5000 mg | Freq: Two times a day (BID) | RESPIRATORY_TRACT | Status: DC
  Administered 2017-04-25 – 2017-04-27 (×4): 0.5 mg via RESPIRATORY_TRACT
  Filled 2017-04-25 (×4): qty 2

## 2017-04-25 MED ORDER — INSULIN ASPART 100 UNIT/ML ~~LOC~~ SOLN: 0.0000 [IU] | Freq: Every day | SUBCUTANEOUS | Status: DC

## 2017-04-25 MED ORDER — HEPARIN (PORCINE) IN NACL 100-0.45 UNIT/ML-% IJ SOLN
1450.0000 [IU]/h | INTRAMUSCULAR | Status: DC
  Administered 2017-04-25: 1450 [IU]/h via INTRAVENOUS
  Filled 2017-04-25 (×2): qty 250

## 2017-04-25 MED ORDER — SODIUM CHLORIDE 0.9% FLUSH
3.0000 mL | Freq: Two times a day (BID) | INTRAVENOUS | Status: DC
  Administered 2017-04-25 – 2017-04-30 (×10): 3 mL via INTRAVENOUS

## 2017-04-25 MED ORDER — ALBUTEROL SULFATE (2.5 MG/3ML) 0.083% IN NEBU
2.5000 mg | INHALATION_SOLUTION | RESPIRATORY_TRACT | Status: DC | PRN
  Administered 2017-04-30: 2.5 mg via RESPIRATORY_TRACT
  Filled 2017-04-25 (×2): qty 3

## 2017-04-25 MED ORDER — FUROSEMIDE 10 MG/ML IJ SOLN
40.0000 mg | Freq: Two times a day (BID) | INTRAMUSCULAR | Status: DC
  Administered 2017-04-26 – 2017-04-29 (×7): 40 mg via INTRAVENOUS
  Filled 2017-04-25 (×7): qty 4

## 2017-04-25 MED ORDER — FUROSEMIDE 10 MG/ML IJ SOLN
40.0000 mg | Freq: Once | INTRAMUSCULAR | Status: AC
  Administered 2017-04-25: 40 mg via INTRAVENOUS
  Filled 2017-04-25: qty 4

## 2017-04-25 NOTE — Progress Notes (Signed)
ANTICOAGULATION CONSULT NOTE - Initial Consult  Pharmacy Consult for heparin  Indication: atrial fibrillation  No Known Allergies  Patient Measurements: Height: 5' 9.5" (176.5 cm) Weight: 253 lb (114.8 kg) IBW/kg (Calculated) : 71.85 Heparin Dosing Weight: 97.3 kg   Vital Signs: Temp: 97.5 F (36.4 C) (12/22 1359) Temp Source: Oral (12/22 1359) BP: 131/85 (12/22 1530) Pulse Rate: 82 (12/22 1542)  Labs: Recent Labs    04/25/17 1404  HGB 11.6*  HCT 35.9*  PLT 248  CREATININE 2.08*  TROPONINI <0.03    Estimated Creatinine Clearance: 29.2 mL/min (A) (by C-G formula based on SCr of 2.08 mg/dL (H)).   Medical History: Past Medical History:  Diagnosis Date  . Chronic atrial fibrillation (HCC)    a. on Couamdin; b. CHADS2VASc at least 7 (CHF, HTN, age x 2, DM, stroke x 2); c. s/p MAZE 1999  . Chronic systolic CHF (congestive heart failure) (HCC)    a. echo 08/01/2015: EF of 45%, mild LVH, mitral valve with annular calcification, mitral valve partially mobile  . CKD (chronic kidney disease), stage III (HCC)   . COPD (chronic obstructive pulmonary disease) (HCC)   . Diabetes mellitus without complication (HCC)   . Hypertension   . Hypothyroidism   . Legally blind   . Mitral valve prolapse    a. s/p mitral valve repair 1999  . Normal coronary arteries    a. by cardiac cath 1999  . Parkinson's disease (HCC)   . Stroke (HCC)   . TIA (transient ischemic attack)   . UTI (lower urinary tract infection)     Assessment: 83 year old patient with home medication of apixaban was brought into ED for being hypoxemic and SOB. Last apixaban dose was this AM. Starting heparin drip for AFib.   Goal of Therapy:  Heparin level 0.3-0.7 units/ml Monitor platelets by anticoagulation protocol: Yes    Plan:  Will start heparin infusion at 1450 U/hr. Will not bolus per MD. Per med rec from nursing home, patient received apixaban 2.5mg  dose this morning.  Will check daily heparin level  and CBC while on heparin.  Right now, will get baseline APTT and will need to dose per APTT until level within range and correlation with Heparin level. Will check levels 8 hours after starting heparin infusion.    Troy [name]  Pharmacy Resident  04/25/2017,4:47 PM

## 2017-04-25 NOTE — Progress Notes (Signed)
eLink Physician-Brief Progress Note Patient Name: Troy BurnRobert [name] DOB: 12-Oct-1923 MRN: [id number]   Date of Service  04/25/2017  HPI/Events of Note  [age] M presenting with hypoxia and SOB.  Placed on BiPAP for CHF.  Given IV lasix.    On camera check the patient is HD stable with BP of 131/77 (95), HR of 72, RR of 15 and sats of 97%.  He is comfortable on BiPAP.  He has had 850 cc UOP.  He is in AF on heparin gtt. Patient is a DNR  eICU Interventions  Plan of care per primary admitting team Continue with current therapies PCCM to see at bedside Monitor via [facility]     Intervention Category Evaluation Type: New Patient Evaluation  DETERDING,ELIZABETH 04/25/2017, 8:51 PM

## 2017-04-25 NOTE — H&P (Signed)
[facility] - [place] at [facility]   PATIENT NAME: Troy [name]    MR#:  [id number]  DATE OF BIRTH:  1924-03-13  DATE OF ADMISSION:  04/25/2017  PRIMARY CARE PHYSICIAN: Barbette Reichmann, MD   REQUESTING/REFERRING PHYSICIAN: Dorothea Glassman  CHIEF COMPLAINT:  Shortness of breath  HISTORY OF PRESENT ILLNESS:  Troy [name]  is a 83 y.o. male with a known history of chronic diastolic congestive heart failure, chronic atrial fibrillation on amiodarone and ELIQUIS is brought into the ED from the nursing home for being hypoxemic and short of breath. Patient's pulse ox was 70% on 2 L of oxygen. EMS bumped up his oxygen and other brought him into the ED. Chest x-ray respiratory congestion. She was given IV Lasix and hospitalist team is called to admit the patient. Eventually patient was requiring 5-6 L of oxygen, and being still hypoxic Patient was placed on BiPAP and admitting him to stepdown unit. Wife at bedside  PAST MEDICAL HISTORY:   Past Medical History:  Diagnosis Date  . Chronic atrial fibrillation (HCC)    a. on Couamdin; b. CHADS2VASc at least 7 (CHF, HTN, age x 2, DM, stroke x 2); c. s/p MAZE 1999  . Chronic systolic CHF (congestive heart failure) (HCC)    a. echo 08/01/2015: EF of 45%, mild LVH, mitral valve with annular calcification, mitral valve partially mobile  . CKD (chronic kidney disease), stage III (HCC)   . COPD (chronic obstructive pulmonary disease) (HCC)   . Diabetes mellitus without complication (HCC)   . Hypertension   . Hypothyroidism   . Legally blind   . Mitral valve prolapse    a. s/p mitral valve repair 1999  . Normal coronary arteries    a. by cardiac cath 1999  . Parkinson's disease (HCC)   . Stroke (HCC)   . TIA (transient ischemic attack)   . UTI (lower urinary tract infection)     PAST SURGICAL HISTOIRY:   Past Surgical History:  Procedure Laterality Date  . CARDIAC CATHETERIZATION N/A 08/08/2015   Procedure:  Temporary Pacemaker;  Surgeon: Peter M Swaziland, MD;  Location: [facility] INVASIVE CV [name];  Service: Cardiovascular;  Laterality: N/A;  . CARDIAC SURGERY    . CHOLECYSTECTOMY    . EP IMPLANTABLE DEVICE N/A 08/08/2015   Procedure: Pacemaker Implant;  Surgeon: Will Jorja Loa, MD;  Location: [facility] INVASIVE CV [name];  Service: Cardiovascular;  Laterality: N/A;    SOCIAL HISTORY:   Social History   Tobacco Use  . Smoking status: Former Smoker    Packs/day: 4.00    Years: 20.00    Pack years: 80.00    Types: Cigarettes  . Smokeless tobacco: Never Used  Substance Use Topics  . Alcohol use: No    Alcohol/week: 0.0 oz    FAMILY HISTORY:   Family History  Problem Relation Age of Onset  . Hypertension Mother   . Stroke Mother   . CAD Father   . Lymphoma Sister   . Lung disease Brother     DRUG ALLERGIES:  No Known Allergies  REVIEW OF SYSTEMS:  CONSTITUTIONAL: No fever, fatigue or weakness.  EYES: No blurred or double vision.  EARS, NOSE, AND THROAT: No tinnitus or ear pain.  RESPIRATORY: No cough, reporting shortness of breath, denies wheezing or hemoptysis.  CARDIOVASCULAR: No chest pain, orthopnea, edema.  GASTROINTESTINAL: No nausea, vomiting, diarrhea or abdominal pain.  GENITOURINARY: No dysuria, hematuria.  ENDOCRINE: No polyuria, nocturia,  HEMATOLOGY: No anemia, easy bruising or  bleeding SKIN: No rash or lesion. MUSCULOSKELETAL: No joint pain or arthritis.   NEUROLOGIC: No tingling, numbness, weakness.  PSYCHIATRY: No anxiety or depression.   MEDICATIONS AT HOME:   Prior to Admission medications   Medication Sig Start Date End Date Taking? Authorizing Provider  albuterol (PROVENTIL HFA;VENTOLIN HFA) 108 (90 Base) MCG/ACT inhaler Inhale 2 puffs every 4 (four) hours as needed into the lungs for wheezing or shortness of breath.    [provider]  albuterol (PROVENTIL) (2.5 MG/3ML) 0.083% nebulizer solution Take 3 mLs (2.5 mg total) by nebulization every 6 (six)  hours as needed for wheezing or shortness of breath. 05/13/16   Katha HammingKonidena, Snehalatha, MD  amiodarone (PACERONE) 200 MG tablet Take 1 tablet (200 mg total) 2 (two) times daily by mouth. 03/18/17   Shaune Pollackhen, Qing, MD  apixaban (ELIQUIS) 2.5 MG TABS tablet Take 2.5 mg 2 (two) times daily by mouth.    [provider]  budesonide (PULMICORT) 0.5 MG/2ML nebulizer solution Take 2 mLs (0.5 mg total) 2 (two) times daily by nebulization. 03/18/17   Shaune Pollackhen, Qing, MD  calcium carbonate (TUMS - DOSED IN MG ELEMENTAL CALCIUM) 500 MG chewable tablet Chew 1 tablet (200 mg of elemental calcium total) 2 (two) times daily by mouth. 03/18/17   Shaune Pollackhen, Qing, MD  carbidopa-levodopa (SINEMET IR) 25-250 MG tablet Take 1 tablet by mouth 4 (four) times daily.     [provider]  cetirizine (ZYRTEC) 10 MG tablet Take 10 mg by mouth daily.    [provider]  famotidine (PEPCID) 40 MG tablet Take 1 tablet (40 mg total) daily by mouth. 03/18/17   Shaune Pollackhen, Qing, MD  feeding supplement, ENSURE ENLIVE, (ENSURE ENLIVE) LIQD Take 237 mLs 3 (three) times daily between meals by mouth. 03/18/17   Shaune Pollackhen, Qing, MD  fluticasone-salmeterol (ADVAIR [facility]) [phone] MCG/ACT inhaler Inhale 2 puffs into the lungs 2 (two) times daily. 12/11/15   Alford HighlandWieting, Richard, MD  furosemide (LASIX) 40 MG tablet Take 1 tablet (40 mg total) by mouth daily. 03/21/16   Katharina CaperVaickute, Rima, MD  gabapentin (NEURONTIN) 100 MG capsule Take 100 mg 3 (three) times daily by mouth.    [provider]  glipiZIDE (GLUCOTROL) 10 MG tablet Take 10 mg by mouth daily before breakfast.    [provider]  HYDROcodone-acetaminophen (NORCO/VICODIN) 5-325 MG tablet Take 1 tablet every 6 (six) hours as needed by mouth for moderate pain. 03/18/17   Shaune Pollackhen, Qing, MD  insulin aspart (NOVOLOG) 100 UNIT/ML injection Inject 0-5 Units into the skin at bedtime. Patient not taking: Reported on 03/09/2017 05/13/16   Katha HammingKonidena, Snehalatha, MD  insulin glargine (LANTUS) 100  UNIT/ML injection Inject 0.32 mLs (32 Units total) into the skin daily. Patient not taking: Reported on 04/22/2017 05/14/16   Katha HammingKonidena, Snehalatha, MD  insulin regular (NOVOLIN R,HUMULIN R) 100 units/mL injection Inject 15-25 Units 2 (two) times daily before a meal into the skin.     [provider]  metroNIDAZOLE (METROGEL) 0.75 % gel Apply 1 application topically daily.    [provider]  multivitamin-lutein [facility](OCUVITE-LUTEIN) CAPS capsule Take 1 capsule daily by mouth. 03/18/17   Shaune Pollackhen, Qing, MD  potassium chloride (MICRO-K) 10 MEQ CR capsule Take 10 mEq by mouth daily.    [provider]  predniSONE (DELTASONE) 5 MG tablet Take by mouth. 11/13/16   [provider]  saxagliptin HCl (ONGLYZA) 5 MG TABS tablet TAKE 1 TABLET BY MOUTH DAILY 10/28/16   [provider]  tamsulosin (FLOMAX) 0.4 MG  CAPS capsule Take 1 capsule (0.4 mg total) daily by mouth. 03/18/17   Shaune Pollackhen, Qing, MD      VITAL SIGNS:  Blood pressure 131/85, pulse 82, temperature (!) 97.5 F (36.4 C), temperature source Oral, resp. rate 19, height 5' 9.5" (1.765 m), weight 114.8 kg (253 lb), SpO2 (!) 85 %.  PHYSICAL EXAMINATION:  GENERAL:  83 y.o.-year-old patient lying in the bed with no acute distress.  EYES: Pupils equal, round, reactive to light and accommodation. No scleral icterus. Extraocular muscles intact.  HEENT: Head atraumatic, normocephalic. Oropharynx and nasopharynx clear.  NECK:  Supple, no jugular venous distention. No thyroid enlargement, no tenderness.  LUNGS: Diminished breath sounds bilaterally, with rales and rhonchi, no wheezing,crepitation. No use of accessory muscles of respiration.  CARDIOVASCULAR: S1, S2 normal. No murmurs, rubs, or gallops.  ABDOMEN: Soft, nontender, nondistended. Bowel sounds present. No organomegaly or mass.  EXTREMITIES: 1+Pedal edema,  no cyanosis, or clubbing.  NEUROLOGIC: Cranial nerves II through XII are intact.  Sensation intact. Gait not  checked.  PSYCHIATRIC: The patient is alert and oriented x2- 3.  SKIN: No obvious rash, lesion, or ulcer.   LABORATORY PANEL:   CBC Recent Labs  [name] 04/25/17 1404  WBC 6.8  HGB 11.6*  HCT 35.9*  PLT 248   ------------------------------------------------------------------------------------------------------------------  Chemistries  Recent Labs  [name] 04/25/17 1404  NA 139  K 3.8  CL 95*  CO2 32  GLUCOSE 191*  BUN 28*  CREATININE 2.08*  CALCIUM 8.7*  AST 17  ALT <5*  ALKPHOS 76  BILITOT 1.5*   ------------------------------------------------------------------------------------------------------------------  Cardiac Enzymes Recent Labs  [name] 04/25/17 1404  TROPONINI <0.03   ------------------------------------------------------------------------------------------------------------------  RADIOLOGY:  Dg Chest Portable 1 View  Result Date: 04/25/2017 CLINICAL DATA:  Shortness of breath. EXAM: PORTABLE CHEST 1 VIEW COMPARISON:  03/15/2017 FINDINGS: Stable enlargement of the cardiac silhouette. Again noted is enlargement of the central vascular structures. Increased densities at the left lung base and increased hazy densities at the right lung base. Stable position of the left cardiac pacemaker. Prior cardiac valve surgery. IMPRESSION: Cardiomegaly with enlarged central vascular structures and slightly increased interstitial markings, particularly the lower lungs. Findings are suggestive for interstitial pulmonary edema. Cannot exclude small effusions. Electronically Signed   By: Richarda OverlieAdam  Henn M.D.   On: 04/25/2017 14:47    EKG:   Orders placed or performed during the hospital encounter of 04/25/17  . ED EKG  . ED EKG    IMPRESSION AND PLAN:   Mayer CamelRobert Wardlow  is a 83 y.o. male with a known history of chronic diastolic congestive heart failure, chronic atrial fibrillation on amiodarone and ELIQUIS is brought into the ED from the nursing home for being hypoxemic and  short of breath. Patient's pulse ox was 70% on 2 L of oxygen. EMS bumped up his oxygen and other brought him into the ED. Chest x-ray respiratory congestion. She was given IV Lasix and hospitalist team is called to admit the patient  #Acute hypoxic respiratory failure second to CHF exacerbation Admit to stepdown unit On BiPAP machine, wean off as tolerated Pulmonology and cardiac consult placed Echocardiogram  #Acute on chronic diastolic congestive heart failure On BiPAP IV Lasix Monitor daily weights and intake and output Get echocardiogram Cardiology consult placed to [facility] gr\ Holding ACE inhibitor in view of acute renal insufficiency Holding beta blocker as patient is nothing by mouth on BiPAP with acute decompensation  #Acute kidney injury on chronic kidney disease Baseline creatinine seems to  be at 1.27 With IV diuresis renal function might get better with improvement of fluid overload, will continue close monitoring Nephrology consult Avoid nephrotoxins  #Diabetes mellitus Sliding scale insulin while patient is nothing by mouth  #Chronic atrial fibrillation Rate controlled. Hold by mouth medications amiodarone and eliquis while patient is on BiPAP Start heparin gtt while holding eliquis     GI and DVT prophylaxis will be provided    All the records are reviewed and case discussed with ED provider. Management plans discussed with the patient, family and they are in agreement.  CODE STATUS: DNR ,son Troy [name] is HCPOA   TOTAL CRITICAL CARE TIME TAKING CARE OF THIS PATIENT: 45 minutes.   Note: This dictation was prepared with Dragon dictation along with smaller phrase technology. Any transcriptional errors that result from this process are unintentional.  Troy [name] M.D on 04/25/2017 at 4:36 PM  Between 7am to 6pm - Pager - [phone]  After 6pm go to [email or web address] - password [email or web address] [facility]  [facility] [place] [facility]  Office  [phone]  CC: Primary  care physician; Barbette Reichmann, MD

## 2017-04-25 NOTE — ED Notes (Signed)
CBG done by this NT; 158 RN Victorino DikeJennifer informed

## 2017-04-25 NOTE — ED Notes (Signed)
RT called for transport. Unable to assist at this time

## 2017-04-25 NOTE — ED Triage Notes (Signed)
Per ems the actual call for them was the staff at [facility] was unable to get his sp02 above 80's

## 2017-04-25 NOTE — ED Provider Notes (Signed)
[facility] Emergency Department Provider Note   ____________________________________________   First MD Initiated Contact with Patient 04/25/17 1405     (approximate)  I have reviewed the triage vital signs and the nursing notes.   HISTORY  Chief Complaint Shortness of Breath   HPI Troy [name] is a 83 y.o. male [facility] was reportedly having a pulse ox in the 70s with no distress not turning color 021 up with oxygen per EMS. Patient is usually on 3 L. Here patient is on 3 L and his oxygen level was 94% he is in no distress is not coughing at present although his wife reports he occasionally does cough and has COPD. His legs were swollen but they're not as swollen now as they were. Patient is talking complete sentences and looks well.patient is not having any pain   Past Medical History:  Diagnosis Date  . Chronic atrial fibrillation (HCC)    a. on Couamdin; b. CHADS2VASc at least 7 (CHF, HTN, age x 2, DM, stroke x 2); c. s/p MAZE 1999  . Chronic systolic CHF (congestive heart failure) (HCC)    a. echo 08/01/2015: EF of 45%, mild LVH, mitral valve with annular calcification, mitral valve partially mobile  . CKD (chronic kidney disease), stage III (HCC)   . COPD (chronic obstructive pulmonary disease) (HCC)   . Diabetes mellitus without complication (HCC)   . Hypertension   . Hypothyroidism   . Legally blind   . Mitral valve prolapse    a. s/p mitral valve repair 1999  . Normal coronary arteries    a. by cardiac cath 1999  . Parkinson's disease (HCC)   . Stroke (HCC)   . TIA (transient ischemic attack)   . UTI (lower urinary tract infection)     Patient Active Problem List   Diagnosis Date Noted  . Pressure injury of skin 03/16/2017  . Acute kidney injury (HCC) 03/10/2017  . Acute respiratory failure (HCC) 03/09/2017  . Acute on chronic respiratory failure (HCC) 05/09/2016  . Dehydration 03/21/2016  .  Hypotension 03/21/2016  . Fall 03/21/2016  . Chest wall contusion, left, initial encounter 03/21/2016  . Hypoxia 03/21/2016  . Generalized weakness 03/21/2016  . Leukocytosis 03/21/2016  . Acute on chronic renal failure (HCC) 03/19/2016  . Acute exacerbation of chronic obstructive pulmonary disease (COPD) (HCC) 12/07/2015  . Acute on chronic respiratory failure with hypoxia (HCC) 09/28/2015  . HTN (hypertension) 09/25/2015  . Parkinson's disease (HCC) 09/25/2015  . Hypothyroidism 09/25/2015  . COPD exacerbation (HCC) 09/12/2015  . Diabetes (HCC) 08/20/2015  . SSS (sick sinus syndrome) (HCC) 08/07/2015  . Chronic atrial fibrillation (HCC)   . Sinus pause   . Sick sinus syndrome (HCC)   . Acute on chronic diastolic CHF (congestive heart failure), NYHA class 1 (HCC) 08/03/2015  . Lower extremity weakness 02/25/2015    Past Surgical History:  Procedure Laterality Date  . CARDIAC CATHETERIZATION N/A 08/08/2015   Procedure: Temporary Pacemaker;  Surgeon: Peter M Swaziland, MD;  Location: [facility] INVASIVE CV LAB;  Service: Cardiovascular;  Laterality: N/A;  . CARDIAC SURGERY    . CHOLECYSTECTOMY    . EP IMPLANTABLE DEVICE N/A 08/08/2015   Procedure: Pacemaker Implant;  Surgeon: Will Jorja Loa, MD;  Location: [facility] INVASIVE CV LAB;  Service: Cardiovascular;  Laterality: N/A;    Prior to Admission medications   Medication Sig Start Date End Date Taking? Authorizing Provider  albuterol (PROVENTIL HFA;VENTOLIN HFA) 108 (90 Base)  MCG/ACT inhaler Inhale 2 puffs every 4 (four) hours as needed into the lungs for wheezing or shortness of breath.    [provider]  albuterol (PROVENTIL) (2.5 MG/3ML) 0.083% nebulizer solution Take 3 mLs (2.5 mg total) by nebulization every 6 (six) hours as needed for wheezing or shortness of breath. 05/13/16   Katha Hamming, MD  amiodarone (PACERONE) 200 MG tablet Take 1 tablet (200 mg total) 2 (two) times daily by mouth. 03/18/17   Shaune Pollack, MD  apixaban  (ELIQUIS) 2.5 MG TABS tablet Take 2.5 mg 2 (two) times daily by mouth.    [provider]  budesonide (PULMICORT) 0.5 MG/2ML nebulizer solution Take 2 mLs (0.5 mg total) 2 (two) times daily by nebulization. 03/18/17   Shaune Pollack, MD  calcium carbonate (TUMS - DOSED IN MG ELEMENTAL CALCIUM) 500 MG chewable tablet Chew 1 tablet (200 mg of elemental calcium total) 2 (two) times daily by mouth. 03/18/17   Shaune Pollack, MD  carbidopa-levodopa (SINEMET IR) 25-250 MG tablet Take 1 tablet by mouth 4 (four) times daily.     [provider]  cetirizine (ZYRTEC) 10 MG tablet Take 10 mg by mouth daily.    [provider]  famotidine (PEPCID) 40 MG tablet Take 1 tablet (40 mg total) daily by mouth. 03/18/17   Shaune Pollack, MD  feeding supplement, ENSURE ENLIVE, (ENSURE ENLIVE) LIQD Take 237 mLs 3 (three) times daily between meals by mouth. 03/18/17   Shaune Pollack, MD  fluticasone-salmeterol (ADVAIR [facility]) [phone] MCG/ACT inhaler Inhale 2 puffs into the lungs 2 (two) times daily. 12/11/15   Alford Highland, MD  furosemide (LASIX) 40 MG tablet Take 1 tablet (40 mg total) by mouth daily. 03/21/16   Katharina Caper, MD  gabapentin (NEURONTIN) 100 MG capsule Take 100 mg 3 (three) times daily by mouth.    [provider]  glipiZIDE (GLUCOTROL) 10 MG tablet Take 10 mg by mouth daily before breakfast.    [provider]  HYDROcodone-acetaminophen (NORCO/VICODIN) 5-325 MG tablet Take 1 tablet every 6 (six) hours as needed by mouth for moderate pain. 03/18/17   Shaune Pollack, MD  insulin aspart (NOVOLOG) 100 UNIT/ML injection Inject 0-5 Units into the skin at bedtime. Patient not taking: Reported on 03/09/2017 05/13/16   Katha Hamming, MD  insulin glargine (LANTUS) 100 UNIT/ML injection Inject 0.32 mLs (32 Units total) into the skin daily. Patient not taking: Reported on 04/22/2017 05/14/16   Katha Hamming, MD  insulin regular (NOVOLIN R,HUMULIN R) 100 units/mL injection Inject  15-25 Units 2 (two) times daily before a meal into the skin.     [provider]  metroNIDAZOLE (METROGEL) 0.75 % gel Apply 1 application topically daily.    [provider]  multivitamin-lutein [facility]) CAPS capsule Take 1 capsule daily by mouth. 03/18/17   Shaune Pollack, MD  potassium chloride (MICRO-K) 10 MEQ CR capsule Take 10 mEq by mouth daily.    [provider]  predniSONE (DELTASONE) 5 MG tablet Take by mouth. 11/13/16   [provider]  saxagliptin HCl (ONGLYZA) 5 MG TABS tablet TAKE 1 TABLET BY MOUTH DAILY 10/28/16   [provider]  tamsulosin (FLOMAX) 0.4 MG CAPS capsule Take 1 capsule (0.4 mg total) daily by mouth. 03/18/17   Shaune Pollack, MD    Allergies Patient has no known allergies.  Family History  Problem Relation Age of Onset  . Hypertension Mother   . Stroke Mother   . CAD Father   . Lymphoma Sister   .  Lung disease Brother     Social History Social History   Tobacco Use  . Smoking status: Former Smoker    Packs/day: 4.00    Years: 20.00    Pack years: 80.00    Types: Cigarettes  . Smokeless tobacco: Never Used  Substance Use Topics  . Alcohol use: No    Alcohol/week: 0.0 oz  . Drug use: No    Review of Systems  Constitutional: No fever/chills Eyes: No visual changes. ENT: No sore throat. Cardiovascular: Denies chest pain. Respiratory: Denies shortness of breath!! Gastrointestinal: No abdominal pain.  No nausea, no vomiting.  No diarrhea.  No constipation. Genitourinary: Negative for dysuria. Musculoskeletal: Negative for back pain. Skin: Negative for rash. Neurological: Negative for headaches, focal weakness  ____________________________________________   PHYSICAL EXAM:  VITAL SIGNS: ED Triage Vitals  Enc Vitals Group     BP 04/25/17 1359 131/69     Pulse Rate 04/25/17 1359 85     Resp 04/25/17 1359 19     Temp 04/25/17 1359 (!) 97.5 F (36.4 C)     Temp Source 04/25/17 1359 Oral      SpO2 04/25/17 1359 94 %     Weight 04/25/17 1357 253 lb (114.8 kg)     Height 04/25/17 1357 5' 9.5" (1.765 m)     Head Circumference --      Peak Flow --      Pain Score --      Pain Loc --      Pain Edu? --      Excl. in GC? --     Constitutional: Alert and oriented. Well appearing and in no acute distress. Eyes: Conjunctivae are normal.  Head: Atraumatic. Nose: No congestion/rhinnorhea. Mouth/Throat: Mucous membranes are moist.  Oropharynx non-erythematous. Neck: No stridor.   Cardiovascular: Normal rate, regular rhythm. Grossly normal heart sounds.  Good peripheral circulation. Respiratory: Normal respiratory effort.  No retractions. Lungs CTAB. Gastrointestinal: Soft and nontender. No distention. No abdominal bruits. No CVA tenderness. Musculoskeletal: No lower extremity tenderness 1+edemabut reportedly much better than usual.  No joint effusions. Neurologic:  Normal speech and language. No gross focal neurologic deficits are appreciated. . Skin:  Skin is warm, dry and intact. No rash noted.except for chronic venous stasis changes in the legs Psychiatric: Mood and affect are normal. Speech and behavior are normal.  ____________________________________________   LABS (all labs ordered are listed, but only abnormal results are displayed)  Labs Reviewed  COMPREHENSIVE METABOLIC PANEL - Abnormal; Notable for the following components:      Result Value   Chloride 95 (*)    Glucose, Bld 191 (*)    BUN 28 (*)    Creatinine, Ser 2.08 (*)    Calcium 8.7 (*)    Albumin 3.3 (*)    ALT <5 (*)    Total Bilirubin 1.5 (*)    GFR calc non Af Amer 26 (*)    GFR calc Af Amer 30 (*)    All other components within normal limits  CBC WITH DIFFERENTIAL/PLATELET - Abnormal; Notable for the following components:   RBC 3.79 (*)    Hemoglobin 11.6 (*)    HCT 35.9 (*)    RDW 16.6 (*)    All other components within normal limits  TROPONIN I  INFLUENZA PANEL BY PCR (TYPE A & B)  BRAIN  NATRIURETIC PEPTIDE   ____________________________________________  EKG  EKG read and interpreted by me shows A. fib at a rate of 87 normal axis decreased  R-wave progression but no acute ST-T wave changesEKG looks similar to one from last month. ____________________________________________  RADIOLOGY chest x-ray consistent with congestive heart failure ____________________________________________   PROCEDURES  Procedure(s) performed:   Procedures  Critical Care performed:   ____________________________________________   INITIAL IMPRESSION / ASSESSMENT AND PLAN / ED COURSE  patient signed out to Dr. Derrill KayGoodman labs are pending anticipated admission      ____________________________________________   FINAL CLINICAL IMPRESSION(S) / ED DIAGNOSES  Final diagnoses:  Hypoxia  Acute systolic congestive heart failure [facility](HCC)     ED Discharge Orders    None       Note:  This document was prepared using Dragon voice recognition software and may include unintentional dictation errors.    Arnaldo NatalMalinda, Zamere Pasternak F, MD 04/25/17 1500

## 2017-04-25 NOTE — ED Notes (Signed)
RT called for transport to ICU.  

## 2017-04-25 NOTE — ED Triage Notes (Signed)
Pt [facility] [place] who called for his sob - was in the 70's with no distress or discoloration per ems sp02 better with nrb. Usually on 3 liters. No other complaints

## 2017-04-25 NOTE — ED Notes (Signed)
ID braclet from facility cut off. Metal clasp caused stage 2 skin breakdown

## 2017-04-25 NOTE — ED Notes (Signed)
Patient continuing to desat into the 80's on 5L [place]. O2 increased to 6L with no improvement. Spoke with EDP who instructed this RN to place non-rebreather back on patient at 10L. Oxygenation improved to 99%. Admitting doctor made aware.

## 2017-04-25 NOTE — ED Notes (Signed)
Admitting at bedside.  Raised pt oxygen from 4L to 5L nasal cannula d/t pt maintaining oxygen saturation at 85%. Came up to 91%. Will continue to monitor.

## 2017-04-25 NOTE — Consult Note (Signed)
Name: Troy BurnRobert [name] MRN: [id number] DOB: 02/16/1924    ADMISSION DATE:  04/25/2017 CONSULTATION DATE: 04/25/2017  REFERRING MD : Dr. Amado CoeGouru  CHIEF COMPLAINT: Shortness of Breath   BRIEF PATIENT DESCRIPTION:  83 yo male admitted 12/22 to Stepdown Unit with acute on chronic hypoxic respiratory failure secondary to CHF exacerbation requiring Bipap  SIGNIFICANT EVENTS  [facility]   STUDIES:  None   HISTORY OF PRESENT ILLNESS:   This is a 83 yo male with a PMH of UTI, TIA, Stroke, Parkinson's Disease, Mitral Valve Prolapse, Legally Blind, COPD, Chronic Home O2 @3L , HTN, Hypothyroidism, Diabetes Mellitus, CKD Stage III, Chronic Systolic CHF, and Chronic Atrial Fibrillation on Coumadin.  [facility] [place] EMS with shortness of breath O2 sats in the 70's on 3L O2, therefore he was placed on NRB mask by EMS. In the ER it was noted the pt had bilateral lower extremity edema, CXR revealed interstitial pulmonary edema.  In the ER he remained hypoxic, therefore he was placed on Bipap and subsequently admitted to the Stepdown Unit by hospitalist team.   PAST MEDICAL HISTORY :   has a past medical history of Chronic atrial fibrillation (HCC), Chronic systolic CHF (congestive heart failure) (HCC), CKD (chronic kidney disease), stage III (HCC), COPD (chronic obstructive pulmonary disease) (HCC), Diabetes mellitus without complication (HCC), Hypertension, Hypothyroidism, Legally blind, Mitral valve prolapse, Normal coronary arteries, Parkinson's disease (HCC), Stroke (HCC), TIA (transient ischemic attack), and UTI (lower urinary tract infection).  has a past surgical history that includes Cardiac surgery; Cholecystectomy; Cardiac catheterization (N/A, 08/08/2015); and Cardiac catheterization (N/A, 08/08/2015). Prior to Admission medications   Medication Sig Start Date End Date Taking? Authorizing Provider  albuterol (PROVENTIL HFA;VENTOLIN HFA) 108  (90 Base) MCG/ACT inhaler Inhale 2 puffs every 4 (four) hours as needed into the lungs for wheezing or shortness of breath.    [provider]  albuterol (PROVENTIL) (2.5 MG/3ML) 0.083% nebulizer solution Take 3 mLs (2.5 mg total) by nebulization every 6 (six) hours as needed for wheezing or shortness of breath. 05/13/16   Katha HammingKonidena, Snehalatha, MD  amiodarone (PACERONE) 200 MG tablet Take 1 tablet (200 mg total) 2 (two) times daily by mouth. 03/18/17   Shaune Pollackhen, Qing, MD  apixaban (ELIQUIS) 2.5 MG TABS tablet Take 2.5 mg 2 (two) times daily by mouth.    [provider]  budesonide (PULMICORT) 0.5 MG/2ML nebulizer solution Take 2 mLs (0.5 mg total) 2 (two) times daily by nebulization. 03/18/17   Shaune Pollackhen, Qing, MD  calcium carbonate (TUMS - DOSED IN MG ELEMENTAL CALCIUM) 500 MG chewable tablet Chew 1 tablet (200 mg of elemental calcium total) 2 (two) times daily by mouth. 03/18/17   Shaune Pollackhen, Qing, MD  carbidopa-levodopa (SINEMET IR) 25-250 MG tablet Take 1 tablet by mouth 4 (four) times daily.     [provider]  cetirizine (ZYRTEC) 10 MG tablet Take 10 mg by mouth daily.    [provider]  famotidine (PEPCID) 40 MG tablet Take 1 tablet (40 mg total) daily by mouth. 03/18/17   Shaune Pollackhen, Qing, MD  feeding supplement, ENSURE ENLIVE, (ENSURE ENLIVE) LIQD Take 237 mLs 3 (three) times daily between meals by mouth. 03/18/17   Shaune Pollackhen, Qing, MD  fluticasone-salmeterol ([facility]IR [facility]) [phone] MCG/ACT inhaler Inhale 2 puffs into the lungs 2 (two) times daily. 12/11/15   Alford HighlandWieting, Richard, MD  furosemide (LASIX) 40 MG tablet Take 1 tablet (40 mg total) by mouth daily. 03/21/16  Katharina Caper, MD  gabapentin (NEURONTIN) 100 MG capsule Take 100 mg 3 (three) times daily by mouth.    [provider]  glipiZIDE (GLUCOTROL) 10 MG tablet Take 10 mg by mouth daily before breakfast.    [provider]  HYDROcodone-acetaminophen (NORCO/VICODIN) 5-325 MG tablet Take 1 tablet every 6 (six)  hours as needed by mouth for moderate pain. 03/18/17   Shaune Pollack, MD  insulin aspart (NOVOLOG) 100 UNIT/ML injection Inject 0-5 Units into the skin at bedtime. Patient not taking: Reported on 03/09/2017 05/13/16   Katha Hamming, MD  insulin glargine (LANTUS) 100 UNIT/ML injection Inject 0.32 mLs (32 Units total) into the skin daily. Patient not taking: Reported on 04/22/2017 05/14/16   Katha Hamming, MD  insulin regular (NOVOLIN R,HUMULIN R) 100 units/mL injection Inject 15-25 Units 2 (two) times daily before a meal into the skin.     [provider]  metroNIDAZOLE (METROGEL) 0.75 % gel Apply 1 application topically daily.    [provider]  multivitamin-lutein [facility]) CAPS capsule Take 1 capsule daily by mouth. 03/18/17   Shaune Pollack, MD  potassium chloride (MICRO-K) 10 MEQ CR capsule Take 10 mEq by mouth daily.    [provider]  predniSONE (DELTASONE) 5 MG tablet Take by mouth. 11/13/16   [provider]  saxagliptin HCl (ONGLYZA) 5 MG TABS tablet TAKE 1 TABLET BY MOUTH DAILY 10/28/16   [provider]  tamsulosin (FLOMAX) 0.4 MG CAPS capsule Take 1 capsule (0.4 mg total) daily by mouth. 03/18/17   Shaune Pollack, MD   No Known Allergies  FAMILY HISTORY:  family history includes CAD in his father; Hypertension in his mother; Lung disease in his brother; Lymphoma in his sister; Stroke in his mother. SOCIAL HISTORY:  reports that he has quit smoking. His smoking use included cigarettes. He has a 80.00 pack-year smoking history. he has never used smokeless tobacco. He reports that he does not drink alcohol or use drugs.  REVIEW OF SYSTEMS: Positives in BOLD  Constitutional: Negative for fever, chills, weight loss, malaise/fatigue and diaphoresis.  HENT: Negative for hearing loss, ear pain, nosebleeds, congestion, sore throat, neck pain, tinnitus and ear discharge.   Eyes: Negative for blurred vision, double vision, photophobia, pain,  discharge and redness.  Respiratory:cough, hemoptysis, sputum production, shortness of breath, wheezing and stridor.   Cardiovascular: Negative for chest pain, palpitations, orthopnea, claudication, leg swelling and PND.  Gastrointestinal: Negative for heartburn, nausea, vomiting, abdominal pain, diarrhea, constipation, blood in stool and melena.  Genitourinary: Negative for dysuria, urgency, frequency, hematuria and flank pain.  Musculoskeletal: Negative for myalgias, back pain, joint pain and falls.  Skin: Negative for itching and rash.  Neurological: Negative for dizziness, tingling, tremors, sensory change, speech change, focal weakness, seizures, loss of consciousness, weakness and headaches.  Endo/Heme/Allergies: Negative for environmental allergies and polydipsia. Does not bruise/bleed easily.  SUBJECTIVE:  Pt states his breathing has improved since he was placed on Bipap.  VITAL SIGNS: Temp:  [97.5 F (36.4 C)] 97.5 F (36.4 C) (12/22 1359) Pulse Rate:  [79-85] 82 (12/22 1542) Resp:  [18-23] 19 (12/22 1542) BP: (125-136)/(69-85) 131/85 (12/22 1530) SpO2:  [85 %-94 %] 85 % (12/22 1542) Weight:  [114.8 kg (253 lb)] 114.8 kg (253 lb) (12/22 1357)  PHYSICAL EXAMINATION: General: well developed, well nourished male, NAD on Bipap  Neuro: oriented follows commands HEENT: supple, no JVD  Cardiovascular: irregular, irregular, no M Lungs: faint crackles throughout, even, non labored  Abdomen: +BS x4, obese, soft,  non tender, non distended  Musculoskeletal: trace bilateral lower extremity edema, normal tone Skin: Stage I left heel pressure ulcer  Recent Labs  Lab 04/25/17 1404  NA 139  K 3.8  CL 95*  CO2 32  BUN 28*  CREATININE 2.08*  GLUCOSE 191*   Recent Labs  Lab 04/25/17 1404  HGB 11.6*  HCT 35.9*  WBC 6.8  PLT 248   Dg Chest Portable 1 View  Result Date: 04/25/2017 CLINICAL DATA:  Shortness of breath. EXAM: PORTABLE CHEST 1 VIEW COMPARISON:  03/15/2017  FINDINGS: Stable enlargement of the cardiac silhouette. Again noted is enlargement of the central vascular structures. Increased densities at the left lung base and increased hazy densities at the right lung base. Stable position of the left cardiac pacemaker. Prior cardiac valve surgery. IMPRESSION: Cardiomegaly with enlarged central vascular structures and slightly increased interstitial markings, particularly the lower lungs. Findings are suggestive for interstitial pulmonary edema. Cannot exclude small effusions. Electronically Signed   By: Richarda OverlieAdam  Henn M.D.   On: 04/25/2017 14:47    ASSESSMENT / PLAN: Acute on Chronic Hypoxic Respiratory Failure secondary to CHF Exacerbation  Acute on Chronic Renal Failure  Anemia without blood loss  Stage I Left Heel Pressure Ulcer  Hx: Diabetes Mellitus, COPD, Stroke, and Chronic Atrial Fibrillation  P: Prn Bipap for dyspnea and/or hypoxia Maintain O2 sats 88% to 92% Repeat CXR in the am  Prn bronchodilator therapy  Continue scheduled nebulized steroids IV lasix  Continuous telemetry monitoring Trend troponin's  Cardiology consulted appreciate input  Echo pending  CBG's ac/hs and SSI  Trend BMP Replace electrolytes as indicated Monitor UOP  Heparin gtt dosing per pharmacy  Trend CBC Monitor for s/sx of bleeding  Transfuse for hgb <7  Sonda Rumbleana Carlton Sweaney, AGNP  Pulmonary/Critical Care Pager [phone] (please enter 7 digits) PCCM Consult Pager [phone] (please enter 7 digits)

## 2017-04-25 NOTE — ED Notes (Signed)
Urinal in place for patients comfort

## 2017-04-26 ENCOUNTER — Inpatient Hospital Stay: Payer: Medicare Other

## 2017-04-26 DIAGNOSIS — J9621 Acute and chronic respiratory failure with hypoxia: Secondary | ICD-10-CM

## 2017-04-26 LAB — BASIC METABOLIC PANEL
ANION GAP: 6 (ref 5–15)
BUN: 24 mg/dL — ABNORMAL HIGH (ref 6–20)
CALCIUM: 8.4 mg/dL — AB (ref 8.9–10.3)
CO2: 36 mmol/L — AB (ref 22–32)
Chloride: 98 mmol/L — ABNORMAL LOW (ref 101–111)
Creatinine, Ser: 1.85 mg/dL — ABNORMAL HIGH (ref 0.61–1.24)
GFR, EST AFRICAN AMERICAN: 35 mL/min — AB (ref >60)
GFR, EST NON AFRICAN AMERICAN: 30 mL/min — AB (ref >60)
GLUCOSE: 139 mg/dL — AB (ref 65–99)
POTASSIUM: 3.5 mmol/L (ref 3.5–5.1)
Sodium: 140 mmol/L (ref 135–145)

## 2017-04-26 LAB — CBC
HCT: 33.2 % — ABNORMAL LOW (ref 40.0–52.0)
HCT: 34.3 % — ABNORMAL LOW (ref 40.0–52.0)
Hemoglobin: 10.7 g/dL — ABNORMAL LOW (ref 13.0–18.0)
Hemoglobin: 11.2 g/dL — ABNORMAL LOW (ref 13.0–18.0)
MCH: 30.8 pg (ref 26.0–34.0)
MCH: 31 pg (ref 26.0–34.0)
MCHC: 32.3 g/dL (ref 32.0–36.0)
MCHC: 32.8 g/dL (ref 32.0–36.0)
MCV: 95.3 fL (ref 80.0–100.0)
MCV: 94.5 fL (ref 80.0–100.0)
PLATELETS: 181 10*3/uL (ref 150–440)
Platelets: 184 10*3/uL (ref 150–440)
RBC: 3.49 MIL/uL — ABNORMAL LOW (ref 4.40–5.90)
RBC: 3.63 MIL/uL — ABNORMAL LOW (ref 4.40–5.90)
RDW: 16.3 % — ABNORMAL HIGH (ref 11.5–14.5)
RDW: 16.1 % — AB (ref 11.5–14.5)
WBC: 5.6 10*3/uL (ref 3.8–10.6)
WBC: 5.7 10*3/uL (ref 3.8–10.6)

## 2017-04-26 LAB — GLUCOSE, CAPILLARY
GLUCOSE-CAPILLARY: 125 mg/dL — AB (ref 65–99)
GLUCOSE-CAPILLARY: 148 mg/dL — AB (ref 65–99)
GLUCOSE-CAPILLARY: 152 mg/dL — AB (ref 65–99)
GLUCOSE-CAPILLARY: 154 mg/dL — AB (ref 65–99)
Glucose-Capillary: 138 mg/dL — ABNORMAL HIGH (ref 65–99)
Glucose-Capillary: 104 mg/dL — ABNORMAL HIGH (ref 65–99)
Glucose-Capillary: 133 mg/dL — ABNORMAL HIGH (ref 65–99)

## 2017-04-26 LAB — APTT
APTT: 71 s — AB (ref 24–36)
aPTT: 134 s — ABNORMAL HIGH (ref 24–36)
aPTT: 90 seconds — ABNORMAL HIGH (ref 24–36)

## 2017-04-26 LAB — HEPARIN LEVEL (UNFRACTIONATED)
HEPARIN UNFRACTIONATED: 1.88 [IU]/mL — AB (ref 0.30–0.70)
Heparin Unfractionated: 1.52 IU/mL — ABNORMAL HIGH (ref 0.30–0.70)

## 2017-04-26 LAB — TROPONIN I
Troponin I: <0.03 ng/mL
Troponin I: <0.03 ng/mL (ref <0.03)

## 2017-04-26 MED ORDER — ORAL CARE MOUTH RINSE
15.0000 mL | Freq: Two times a day (BID) | OROMUCOSAL | Status: DC
  Administered 2017-04-27 – 2017-04-30 (×7): 15 mL via OROMUCOSAL

## 2017-04-26 MED ORDER — PIPERACILLIN-TAZOBACTAM 3.375 G IVPB
3.3750 g | Freq: Three times a day (TID) | INTRAVENOUS | Status: DC
  Administered 2017-04-26 – 2017-04-27 (×3): 3.375 g via INTRAVENOUS
  Filled 2017-04-26 (×3): qty 50

## 2017-04-26 MED ORDER — HEPARIN (PORCINE) IN NACL 100-0.45 UNIT/ML-% IJ SOLN
1200.0000 [IU]/h | INTRAMUSCULAR | Status: DC
  Administered 2017-04-26: 1200 [IU]/h via INTRAVENOUS
  Administered 2017-04-27: 1000 [IU]/h via INTRAVENOUS
  Filled 2017-04-26 (×2): qty 250

## 2017-04-26 MED ORDER — CHLORHEXIDINE GLUCONATE 0.12 % MT SOLN
15.0000 mL | Freq: Two times a day (BID) | OROMUCOSAL | Status: DC
  Administered 2017-04-26 – 2017-04-30 (×8): 15 mL via OROMUCOSAL
  Filled 2017-04-26 (×7): qty 15

## 2017-04-26 NOTE — Progress Notes (Signed)
Name: Troy BurnRobert [name] MRN: [id number] DOB: Jul 13, 1923     CONSULTATION DATE: 04/25/2017  Subjective: No complaints today  Objective: Remains on the BiPAP in no distress PAST MEDICAL HISTORY :   has a past medical history of Chronic atrial fibrillation (HCC), Chronic systolic CHF (congestive heart failure) (HCC), CKD (chronic kidney disease), stage III (HCC), COPD (chronic obstructive pulmonary disease) (HCC), Diabetes mellitus without complication (HCC), Hypertension, Hypothyroidism, Legally blind, Mitral valve prolapse, Normal coronary arteries, Parkinson's disease (HCC), Stroke (HCC), TIA (transient ischemic attack), and UTI (lower urinary tract infection).  has a past surgical history that includes Cardiac surgery; Cholecystectomy; Cardiac catheterization (N/A, 08/08/2015); and Cardiac catheterization (N/A, 08/08/2015). Prior to Admission medications   Medication Sig Start Date End Date Taking? Authorizing Provider  amiodarone (PACERONE) 200 MG tablet Take 1 tablet (200 mg total) 2 (two) times daily by mouth. 03/18/17  Yes Shaune Pollackhen, Qing, MD  apixaban (ELIQUIS) 2.5 MG TABS tablet Take 2.5 mg 2 (two) times daily by mouth.   Yes [provider]  calcium carbonate (TUMS - DOSED IN MG ELEMENTAL CALCIUM) 500 MG chewable tablet Chew 1 tablet (200 mg of elemental calcium total) 2 (two) times daily by mouth. 03/18/17  Yes Shaune Pollackhen, Qing, MD  carbidopa-levodopa (SINEMET IR) 25-250 MG tablet Take 1 tablet by mouth 4 (four) times daily.    Yes [provider]  cetirizine (ZYRTEC) 10 MG tablet Take 10 mg by mouth daily.   Yes [provider]  famotidine (PEPCID) 40 MG tablet Take 1 tablet (40 mg total) daily by mouth. 03/18/17  Yes Shaune Pollackhen, Qing, MD  gabapentin (NEURONTIN) 100 MG capsule Take 100 mg 3 (three) times daily by mouth.   Yes [provider]  glipiZIDE (GLUCOTROL) 10 MG tablet Take 10 mg by mouth daily before breakfast.   Yes [provider]  insulin  glargine (LANTUS) 100 UNIT/ML injection Inject 0.32 mLs (32 Units total) into the skin daily. 05/14/16  Yes Katha HammingKonidena, Snehalatha, MD  insulin regular (NOVOLIN R,HUMULIN R) 100 units/mL injection Inject 15-25 Units 2 (two) times daily before a meal into the skin.    Yes [provider]  metroNIDAZOLE (METROGEL) 0.75 % gel Apply 1 application topically daily.   Yes [provider]  multivitamin-lutein [facility](OCUVITE-LUTEIN) CAPS capsule Take 1 capsule daily by mouth. 03/18/17  Yes Shaune Pollackhen, Qing, MD  potassium chloride (MICRO-K) 10 MEQ CR capsule Take 10 mEq by mouth daily.   Yes [provider]  tamsulosin (FLOMAX) 0.4 MG CAPS capsule Take 1 capsule (0.4 mg total) daily by mouth. 03/18/17  Yes Shaune Pollackhen, Qing, MD  albuterol (PROVENTIL HFA;VENTOLIN HFA) 108 (90 Base) MCG/ACT inhaler Inhale 2 puffs every 4 (four) hours as needed into the lungs for wheezing or shortness of breath.    [provider]  albuterol (PROVENTIL) (2.5 MG/3ML) 0.083% nebulizer solution Take 3 mLs (2.5 mg total) by nebulization every 6 (six) hours as needed for wheezing or shortness of breath. 05/13/16   Katha HammingKonidena, Snehalatha, MD  budesonide (PULMICORT) 0.5 MG/2ML nebulizer solution Take 2 mLs (0.5 mg total) 2 (two) times daily by nebulization. 03/18/17   Shaune Pollackhen, Qing, MD  feeding supplement, ENSURE ENLIVE, (ENSURE ENLIVE) LIQD Take 237 mLs 3 (three) times daily between meals by mouth. 03/18/17   Shaune Pollackhen, Qing, MD  fluticasone-salmeterol (ADVAIR [facility]) [phone] MCG/ACT inhaler Inhale 2 puffs into the lungs 2 (two) times daily. Patient not taking: Reported on 04/25/2017 12/11/15   Alford HighlandWieting, Richard, MD  furosemide (LASIX) 40 MG tablet Take 1 tablet (  40 mg total) by mouth daily. Patient not taking: Reported on 04/25/2017 03/21/16   Katharina Caper, MD  HYDROcodone-acetaminophen (NORCO/VICODIN) 5-325 MG tablet Take 1 tablet every 6 (six) hours as needed by mouth for moderate pain. Patient not taking: Reported on 04/25/2017  03/18/17   Shaune Pollack, MD  insulin aspart (NOVOLOG) 100 UNIT/ML injection Inject 0-5 Units into the skin at bedtime. Patient not taking: Reported on 03/09/2017 05/13/16   Katha Hamming, MD   No Known Allergies     VITAL SIGNS: Temp:  [97.5 F (36.4 C)-98.2 F (36.8 C)] 98.2 F (36.8 C) (12/23 0400) Pulse Rate:  [65-85] 78 (12/23 0800) Resp:  [12-25] 15 (12/23 0800) BP: (104-146)/(58-86) 130/60 (12/23 0800) SpO2:  [82 %-98 %] 93 % (12/23 0800) FiO2 (%):  [40 %] 40 % (12/23 0700) Weight:  [111.3 kg (245 lb 6 oz)-114.8 kg (253 lb)] 111.3 kg (245 lb 6 oz) (12/23 0500)  Physical Examination:   -Awake and oriented in no acute neurological deficits -Tolerating BiPAP 12/5-40%, no distress, bilateral equal air entry and no adventitious sounds. -S1 and S2 are audible no murmur -Benign abdomen was normal peristalsis -Extremities: no peripheral edema.  Dressed left heel ulcer     Recent Labs  Lab 04/25/17 1404 04/26/17 0212  NA 139 140  K 3.8 3.5  CL 95* 98*  CO2 32 36*  BUN 28* 24*  CREATININE 2.08* 1.85*  GLUCOSE 191* 139*   Recent Labs  Lab 04/25/17 1404 04/26/17 0212 04/26/17 0811  HGB 11.6* 10.7* 11.2*  HCT 35.9* 33.2* 34.3*  WBC 6.8 5.6 5.7  PLT 248 184 181   Dg Chest Port 1 View  Result Date: 04/26/2017 CLINICAL DATA:  Acute respiratory failure EXAM: PORTABLE CHEST 1 VIEW COMPARISON:  Yesterday FINDINGS: Marked cardiomegaly, chronic status post mitral valve repair. Single chamber pacer implant. Vascular congestion/ edema and vascular pedicle widening. No air bronchogram, effusion, or pneumothorax. IMPRESSION: 1. Stable CHF pattern. 2. Pneumonia could be obscured, especially at the left base Electronically Signed   By: Marnee Spring M.D.   On: 04/26/2017 07:12   Dg Chest Portable 1 View  Result Date: 04/25/2017 CLINICAL DATA:  Shortness of breath. EXAM: PORTABLE CHEST 1 VIEW COMPARISON:  03/15/2017 FINDINGS: Stable enlargement of the cardiac silhouette.  Again noted is enlargement of the central vascular structures. Increased densities at the left lung base and increased hazy densities at the right lung base. Stable position of the left cardiac pacemaker. Prior cardiac valve surgery. IMPRESSION: Cardiomegaly with enlarged central vascular structures and slightly increased interstitial markings, particularly the lower lungs. Findings are suggestive for interstitial pulmonary edema. Cannot exclude small effusions. Electronically Signed   By: Richarda Overlie M.D.   On: 04/25/2017 14:47    ASSESSMENT / PLAN:  -Acute on chronic respiratory failure, tolerating BiPAP settings of 04/09/39%.  Baseline home O2 2 L/min nasal cannula.   -Chronic A. Fib, s/p pacemaker placement amiodarone for rate control and currently on heparin drip.  -CHF with diastolic dysfunction, mitral valve prolapse status post mitral valve repair 1999.  -Atelectasis and pneumonia (bilateral airspace disease).  Worsening airspace disease on chest x-ray was increased FiO2 requirement.  Start on empiric coverage with Zosyn, monitor chest x-ray + CBC + and FiO2 requirement..  -CKD.  Avoid nephrotoxins, monitor renal panel and urine output.  -Diabetes mellitus.  Optimize glycemic control.  -COPD.  Bronchodilators and Pulmicort.  -HTN.  Denies antihypertensives and monitor hemodynamics.  -Hypothyroidism.  Monitor TSH and free T4.  -Parkinson's  disease.  On Sinemet.  -Anemia.  Monitor H and H and keep hemoglobin more than 7 g/dL.  -Legally blind.  Supportive care  -DNR.  -DVT and GI prophylaxis.  Continue with supportive care.  *Patient was updated about his status and plan of care. Critical care time 35 minutes

## 2017-04-26 NOTE — Progress Notes (Signed)
ANTICOAGULATION CONSULT NOTE - Initial Consult  Pharmacy Consult for heparin  Indication: atrial fibrillation  No Known Allergies  Patient Measurements: Height: 6' (182.9 cm) Weight: 245 lb 6 oz (111.3 kg) IBW/kg (Calculated) : 77.6 Heparin Dosing Weight: 97 kg   Vital Signs: Temp: 98.7 F (37.1 C) (12/23 1930) Temp Source: Axillary (12/23 1930) BP: 117/81 (12/23 2020) Pulse Rate: 74 (12/23 2020)  Labs: Recent Labs    04/25/17 1404  04/25/17 2036 04/26/17 0212 04/26/17 0811 04/26/17 1201 04/26/17 2021  HGB 11.6*  --   --  10.7* 11.2*  --   --   HCT 35.9*  --   --  33.2* 34.3*  --   --   PLT 248  --   --  184 181  --   --   APTT  --    < > 59* 134*  --  71* 90*  HEPARINUNFRC  --   --   --  1.88*  --   --   --   CREATININE 2.08*  --   --  1.85*  --   --   --   TROPONINI <0.03  --  <0.03 <0.03 <0.03  --   --    < > = values in this interval not displayed.    Estimated Creatinine Clearance: 33.5 mL/min (A) (by C-G formula based on SCr of 1.85 mg/dL (H)).   Assessment: 83 year old patient with home medication of apixaban was brought into ED for being hypoxemic and SOB. Last apixaban dose was this AM. Starting heparin drip for AFib.   Goal of Therapy:  APTT 66 - 102 seconds Heparin level 0.3-0.7 units/ml Monitor platelets by anticoagulation protocol: Yes    Plan:  APTT = 71 seconds is therapeutic. Continue heparin drip at current rate of 1200 units/hr and will order confirmatory APTT in 8 hours.  Dosing off of APTT since baseline HL was elevated due to patient being on apixaban.  CBC ordered with AM labs tomorrow.  12/23:  APTT @ 20:21 = 90 Will continue pt on current rate and recheck aPTT and HL on 12/24 with AM labs.   Madelin Weseman D Clinical Pharmacist 04/26/2017

## 2017-04-26 NOTE — Progress Notes (Signed)
ANTICOAGULATION CONSULT NOTE - Initial Consult  Pharmacy Consult for heparin  Indication: atrial fibrillation  No Known Allergies  Patient Measurements: Height: 6' (182.9 cm) Weight: 245 lb 6 oz (111.3 kg) IBW/kg (Calculated) : 77.6 Heparin Dosing Weight: 97.3 kg   Vital Signs: Temp: 97.6 F (36.4 C) (12/23 0000) Temp Source: Axillary (12/23 0000) BP: 104/58 (12/23 0000) Pulse Rate: 68 (12/23 0100)  Labs: Recent Labs    04/25/17 1404 04/25/17 2036 04/26/17 0212  HGB 11.6*  --  10.7*  HCT 35.9*  --  33.2*  PLT 248  --  184  APTT  --  59* 134*  CREATININE 2.08*  --  1.85*  TROPONINI <0.03 <0.03 <0.03    Estimated Creatinine Clearance: 33.5 mL/min (A) (by C-G formula based on SCr of 1.85 mg/dL (H)).   Medical History: Past Medical History:  Diagnosis Date  . Chronic atrial fibrillation (HCC)    a. on Couamdin; b. CHADS2VASc at least 7 (CHF, HTN, age x 2, DM, stroke x 2); c. s/p MAZE 1999  . Chronic systolic CHF (congestive heart failure) (HCC)    a. echo 08/01/2015: EF of 45%, mild LVH, mitral valve with annular calcification, mitral valve partially mobile  . CKD (chronic kidney disease), stage III (HCC)   . COPD (chronic obstructive pulmonary disease) (HCC)   . Diabetes mellitus without complication (HCC)   . Hypertension   . Hypothyroidism   . Legally blind   . Mitral valve prolapse    a. s/p mitral valve repair 1999  . Normal coronary arteries    a. by cardiac cath 1999  . Parkinson's disease (HCC)   . Stroke (HCC)   . TIA (transient ischemic attack)   . UTI (lower urinary tract infection)     Assessment: 83 year old patient with home medication of apixaban was brought into ED for being hypoxemic and SOB. Last apixaban dose was this AM. Starting heparin drip for AFib.   Goal of Therapy:  Heparin level 0.3-0.7 units/ml Monitor platelets by anticoagulation protocol: Yes    Plan:  Will start heparin infusion at 1450 U/hr. Will not bolus per MD. Per med  rec from nursing home, patient received apixaban 2.5mg  dose this morning.  Will check daily heparin level and CBC while on heparin.  Right now, will get baseline APTT and will need to dose per APTT until level within range and correlation with Heparin level. Will check levels 8 hours after starting heparin infusion.   12/22 @ 0200 aPTT 134 supratherapeutic. Will hold heparin drip for 1 hour and resume @ 0400 at 1200 units/hr and will recheck HL/aPTT @ 1200. Will monitor CBC w/ am labs.  Thomasene Rippleavid Tyannah Sane, PharmD, BCPS Clinical Pharmacist 04/26/2017

## 2017-04-26 NOTE — Progress Notes (Signed)
ANTIBIOTIC CONSULT NOTE - INITIAL  Pharmacy Consult for Zosyn Indication: pneumonia  No Known Allergies  Patient Measurements: Height: 6' (182.9 cm) Weight: 245 lb 6 oz (111.3 kg) IBW/kg (Calculated) : 77.6 Adjusted Body Weight:   Vital Signs: Temp: 98.2 F (36.8 C) (12/23 0400) Temp Source: Oral (12/23 0400) BP: 130/60 (12/23 0800) Pulse Rate: 78 (12/23 0800) Intake/Output from previous day: 12/22 0701 - 12/23 0700 In: 151.7 [I.V.:151.7] Out: 1870 [Urine:1870] Intake/Output from this shift: No intake/output data recorded.  Labs: Recent Labs    04/25/17 1404 04/26/17 0212 04/26/17 0811  WBC 6.8 5.6 5.7  HGB 11.6* 10.7* 11.2*  PLT 248 184 181  CREATININE 2.08* 1.85*  --    Estimated Creatinine Clearance: 33.5 mL/min (A) (by C-G formula based on SCr of 1.85 mg/dL (H)). No results for input(s): VANCOTROUGH, VANCOPEAK, VANCORANDOM, GENTTROUGH, GENTPEAK, GENTRANDOM, TOBRATROUGH, TOBRAPEAK, TOBRARND, AMIKACINPEAK, AMIKACINTROU, AMIKACIN in the last 72 hours.   Microbiology: Recent Results (from the past 720 hour(s))  MRSA PCR Screening     Status: None   Collection Time: 04/25/17  8:40 PM  Result Value Ref Range Status   MRSA by PCR NEGATIVE NEGATIVE Final    Comment:        The GeneXpert MRSA Assay (FDA approved for NASAL specimens only), is one component of a comprehensive MRSA colonization surveillance program. It is not intended to diagnose MRSA infection nor to guide or monitor treatment for MRSA infections. Performed at [facility], [address] [place], [place] [address]     Medical History: Past Medical History:  Diagnosis Date  . Chronic atrial fibrillation (HCC)    a. on Couamdin; b. CHADS2VASc at least 7 (CHF, HTN, age x 2, DM, stroke x 2); c. s/p MAZE 1999  . Chronic systolic CHF (congestive heart failure) (HCC)    a. echo 08/01/2015: EF of 45%, mild LVH, mitral valve with annular calcification, mitral valve partially mobile  . CKD  (chronic kidney disease), stage III (HCC)   . COPD (chronic obstructive pulmonary disease) (HCC)   . Diabetes mellitus without complication (HCC)   . Hypertension   . Hypothyroidism   . Legally blind   . Mitral valve prolapse    a. s/p mitral valve repair 1999  . Normal coronary arteries    a. by cardiac cath 1999  . Parkinson's disease (HCC)   . Stroke (HCC)   . TIA (transient ischemic attack)   . UTI (lower urinary tract infection)     Medications:  Medications Prior to Admission  Medication Sig Dispense Refill Last Dose  . amiodarone (PACERONE) 200 MG tablet Take 1 tablet (200 mg total) 2 (two) times daily by mouth.   04/25/2017 at Unknown time  . apixaban (ELIQUIS) 2.5 MG TABS tablet Take 2.5 mg 2 (two) times daily by mouth.   04/25/2017 at Unknown time  . calcium carbonate (TUMS - DOSED IN MG ELEMENTAL CALCIUM) 500 MG chewable tablet Chew 1 tablet (200 mg of elemental calcium total) 2 (two) times daily by mouth.   04/25/2017 at Unknown time  . carbidopa-levodopa (SINEMET IR) 25-250 MG tablet Take 1 tablet by mouth 4 (four) times daily.    04/25/2017 at Unknown time  . cetirizine (ZYRTEC) 10 MG tablet Take 10 mg by mouth daily.   04/25/2017 at Unknown time  . famotidine (PEPCID) 40 MG tablet Take 1 tablet (40 mg total) daily by mouth.   04/25/2017 at Unknown time  . gabapentin (NEURONTIN) 100 MG capsule Take 100  mg 3 (three) times daily by mouth.   04/25/2017 at Unknown time  . glipiZIDE (GLUCOTROL) 10 MG tablet Take 10 mg by mouth daily before breakfast.   04/25/2017 at Unknown time  . insulin glargine (LANTUS) 100 UNIT/ML injection Inject 0.32 mLs (32 Units total) into the skin daily. 10 mL 11 Past Week at Unknown time  . insulin regular (NOVOLIN R,HUMULIN R) 100 units/mL injection Inject 15-25 Units 2 (two) times daily before a meal into the skin.    04/25/2017 at Unknown time  . metroNIDAZOLE (METROGEL) 0.75 % gel Apply 1 application topically daily.   Past Week at Unknown time   . multivitamin-lutein (OCUVITE-LUTEIN) CAPS capsule Take 1 capsule daily by mouth.  0 04/25/2017 at Unknown time  . potassium chloride (MICRO-K) 10 MEQ CR capsule Take 10 mEq by mouth daily.   04/25/2017 at Unknown time  . tamsulosin (FLOMAX) 0.4 MG CAPS capsule Take 1 capsule (0.4 mg total) daily by mouth. 30 capsule  04/25/2017 at Unknown time  . albuterol (PROVENTIL HFA;VENTOLIN HFA) 108 (90 Base) MCG/ACT inhaler Inhale 2 puffs every 4 (four) hours as needed into the lungs for wheezing or shortness of breath.   prn at prn  . albuterol (PROVENTIL) (2.5 MG/3ML) 0.083% nebulizer solution Take 3 mLs (2.5 mg total) by nebulization every 6 (six) hours as needed for wheezing or shortness of breath. 75 mL 12 prn at prn  . budesonide (PULMICORT) 0.5 MG/2ML nebulizer solution Take 2 mLs (0.5 mg total) 2 (two) times daily by nebulization.  12 prn at prn  . feeding supplement, ENSURE ENLIVE, (ENSURE ENLIVE) LIQD Take 237 mLs 3 (three) times daily between meals by mouth. 237 mL 12 prn at prn  . fluticasone-salmeterol (ADVAIR HFA) 115-21 MCG/ACT inhaler Inhale 2 puffs into the lungs 2 (two) times daily. (Patient not taking: Reported on 04/25/2017) 1 Inhaler 0 Not Taking at Unknown time  . furosemide (LASIX) 40 MG tablet Take 1 tablet (40 mg total) by mouth daily. (Patient not taking: Reported on 04/25/2017) 30 tablet 6 Not Taking at Unknown time  . HYDROcodone-acetaminophen (NORCO/VICODIN) 5-325 MG tablet Take 1 tablet every 6 (six) hours as needed by mouth for moderate pain. (Patient not taking: Reported on 04/25/2017) 12 tablet 0 Not Taking at Unknown time  . insulin aspart (NOVOLOG) 100 UNIT/ML injection Inject 0-5 Units into the skin at bedtime. (Patient not taking: Reported on 03/09/2017) 10 mL 11 Not Taking at Unknown time   Scheduled:  . budesonide  0.5 mg Nebulization BID  . chlorhexidine  15 mL Mouth Rinse BID  . furosemide  40 mg Intravenous Q12H  . insulin aspart  0-5 Units Subcutaneous QHS  .  insulin aspart  0-9 Units Subcutaneous TID WC  . mouth rinse  15 mL Mouth Rinse q12n4p  . pantoprazole (PROTONIX) IV  40 mg Intravenous Q24H  . sodium chloride flush  3 mL Intravenous Q12H   Assessment: Pharmacy to dose and monitor Zosyn in this 83 year old male being treated for CAP.   Goal of Therapy:    Plan:  Will start Zosyn 3.375 g IV q8 hours.   Mishka Stegemann D 04/26/2017,11:38 AM

## 2017-04-26 NOTE — Progress Notes (Signed)
[facility] [place] [facility]   PATIENT NAME: Troy [name]    MR#:  [id number]  DATE OF BIRTH:  29-Jun-1923  SUBJECTIVE:  CHIEF COMPLAINT:   Chief Complaint  Patient presents with  . Shortness of Breath   Better shortness of breath and cough.  Off BiPAP, on oxygen by nasal cannula 3 L. REVIEW OF SYSTEMS:  Review of Systems  Constitutional: Positive for malaise/fatigue. Negative for chills and fever.  HENT: Negative for sore throat.   Eyes: Negative for blurred vision and double vision.  Respiratory: Positive for cough, sputum production and shortness of breath. Negative for hemoptysis, wheezing and stridor.   Cardiovascular: Negative for chest pain, palpitations, orthopnea and leg swelling.  Gastrointestinal: Negative for abdominal pain, blood in stool, diarrhea, melena, nausea and vomiting.  Genitourinary: Negative for dysuria, flank pain and hematuria.  Musculoskeletal: Negative for back pain and joint pain.  Neurological: Negative for dizziness, sensory change, focal weakness, seizures, loss of consciousness, weakness and headaches.  Endo/Heme/Allergies: Negative for polydipsia.  Psychiatric/Behavioral: Negative for depression. The patient is not nervous/anxious.     DRUG ALLERGIES:  No Known Allergies VITALS:  Blood pressure 130/60, pulse 78, temperature 98.2 F (36.8 C), temperature source Oral, resp. rate 15, height 6' (1.829 m), weight 245 lb 6 oz (111.3 kg), SpO2 93 %. PHYSICAL EXAMINATION:  Physical Exam  Constitutional: He is oriented to person, place, and time and well-developed, well-nourished, and in no distress.  Obesity.  HENT:  Head: Normocephalic.  Mouth/Throat: Oropharynx is clear and moist.  Eyes: Conjunctivae and EOM are normal. Pupils are equal, round, and reactive to light. No scleral icterus.  Neck: Normal range of motion. Neck supple. No JVD present. No tracheal deviation present.  Cardiovascular: Normal rate, regular  rhythm and normal heart sounds. Exam reveals no gallop.  No murmur heard. Pulmonary/Chest: Effort normal. No respiratory distress. He has no wheezes. He has rales.  Abdominal: Soft. Bowel sounds are normal. He exhibits no distension. There is no tenderness. There is no rebound.  Musculoskeletal: Normal range of motion. He exhibits no edema or tenderness.  Left heel in dressing.  Neurological: He is alert and oriented to person, place, and time. No cranial nerve deficit.  Skin: No rash noted. No erythema.  Psychiatric: Affect normal.   LABORATORY PANEL:  Male CBC Recent Labs  Lab 04/26/17 0811  WBC 5.7  HGB 11.2*  HCT 34.3*  PLT 181   ------------------------------------------------------------------------------------------------------------------ Chemistries  Recent Labs  Lab 04/25/17 1404 04/26/17 0212  NA 139 140  K 3.8 3.5  CL 95* 98*  CO2 32 36*  GLUCOSE 191* 139*  BUN 28* 24*  CREATININE 2.08* 1.85*  CALCIUM 8.7* 8.4*  AST 17  --   ALT <5*  --   ALKPHOS 76  --   BILITOT 1.5*  --    RADIOLOGY:  Dg Chest Port 1 View  Result Date: 04/26/2017 CLINICAL DATA:  Acute respiratory failure EXAM: PORTABLE CHEST 1 VIEW COMPARISON:  Yesterday FINDINGS: Marked cardiomegaly, chronic status post mitral valve repair. Single chamber pacer implant. Vascular congestion/ edema and vascular pedicle widening. No air bronchogram, effusion, or pneumothorax. IMPRESSION: 1. Stable CHF pattern. 2. Pneumonia could be obscured, especially at the left base Electronically Signed   By: Marnee SpringJonathon  Watts M.D.   On: 04/26/2017 07:12   Dg Chest Portable 1 View  Result Date: 04/25/2017 CLINICAL DATA:  Shortness of breath. EXAM: PORTABLE CHEST 1 VIEW COMPARISON:  03/15/2017 FINDINGS: Stable enlargement of the  cardiac silhouette. Again noted is enlargement of the central vascular structures. Increased densities at the left lung base and increased hazy densities at the right lung base. Stable position of  the left cardiac pacemaker. Prior cardiac valve surgery. IMPRESSION: Cardiomegaly with enlarged central vascular structures and slightly increased interstitial markings, particularly the lower lungs. Findings are suggestive for interstitial pulmonary edema. Cannot exclude small effusions. Electronically Signed   By: Richarda OverlieAdam  Henn M.D.   On: 04/25/2017 14:47   ASSESSMENT AND PLAN:   Troy [name]  is a 83 y.o. male with a known history of chronic diastolic congestive heart failure, chronic atrial fibrillation on amiodarone and ELIQUIS is brought into the ED from the nursing home for being hypoxemic and short of breath. Patient's pulse ox was 70% on 2 L of oxygen. EMS bumped up his oxygen and other brought him into the ED. Chest x-ray respiratory congestion. She was given IV Lasix and hospitalist team is called to admit the patient  #Acute hypoxic respiratory failure second to CHF exacerbation Off BiPAP, continue oxygen by nasal counter, DuoNeb as needed.   #Acute on chronic diastolic congestive heart failure, LV EF: 60% -   65% (03/10/2017 echo) IV Lasix Holding ACE inhibitor in view of acute renal insufficiency Resume beta blocker.  #Acute kidney injury on chronic kidney disease Baseline creatinine seems to be at 1.27 F/u BMP while on lasix.  #Diabetes mellitus Sliding scale insulin.  #Chronic atrial fibrillation Rate controlled. Resume amiodarone and eliquis, discontinue heparin gtt.  Discussed with intensivist Dr. Duanne LimerickSamaan. All the records are reviewed and case discussed with Care Management/Social Worker. Management plans discussed with the patient, his son and they are in agreement.  CODE STATUS: DNR  TOTAL TIME TAKING CARE OF THIS PATIENT: 37 minutes.   More than 50% of the time was spent in counseling/coordination of care: YES  POSSIBLE D/C IN 2-3 DAYS, DEPENDING ON CLINICAL CONDITION.   Shaune PollackQing Matin Mattioli M.D on 04/26/2017 at 12:01 PM  Between 7am to 6pm - Pager -  [phone]  After 6pm go to [email or web address] - Therapist, nutritionalpassword [email or web address]  [facility]

## 2017-04-26 NOTE — Progress Notes (Signed)
ANTICOAGULATION CONSULT NOTE - Initial Consult  Pharmacy Consult for heparin  Indication: atrial fibrillation  No Known Allergies  Patient Measurements: Height: 6' (182.9 cm) Weight: 245 lb 6 oz (111.3 kg) IBW/kg (Calculated) : 77.6 Heparin Dosing Weight: 97 kg   Vital Signs: Temp: 98.2 F (36.8 C) (12/23 0400) Temp Source: Oral (12/23 0400) BP: 130/60 (12/23 0800) Pulse Rate: 78 (12/23 0800)  Labs: Recent Labs    04/25/17 1404 04/25/17 2036 04/26/17 0212 04/26/17 0811 04/26/17 1201  HGB 11.6*  --  10.7* 11.2*  --   HCT 35.9*  --  33.2* 34.3*  --   PLT 248  --  184 181  --   APTT  --  59* 134*  --  71*  HEPARINUNFRC  --   --  1.88*  --   --   CREATININE 2.08*  --  1.85*  --   --   TROPONINI <0.03 <0.03 <0.03 <0.03  --     Estimated Creatinine Clearance: 33.5 mL/min (A) (by C-G formula based on SCr of 1.85 mg/dL (H)).   Assessment: 83 year old patient with home medication of apixaban was brought into ED for being hypoxemic and SOB. Last apixaban dose was this AM. Starting heparin drip for AFib.   Goal of Therapy:  APTT 66 - 102 seconds Heparin level 0.3-0.7 units/ml Monitor platelets by anticoagulation protocol: Yes    Plan:  APTT = 71 seconds is therapeutic. Continue heparin drip at current rate of 1200 units/hr and will order confirmatory APTT in 8 hours.  Dosing off of APTT since baseline HL was elevated due to patient being on apixaban.  CBC ordered with AM labs tomorrow.  Cindi CarbonMary M Ahnyla Mendel, PharmD, BCPS Clinical Pharmacist 04/26/2017

## 2017-04-27 ENCOUNTER — Other Ambulatory Visit: Payer: Self-pay

## 2017-04-27 ENCOUNTER — Inpatient Hospital Stay: Payer: Medicare Other

## 2017-04-27 DIAGNOSIS — J9601 Acute respiratory failure with hypoxia: Secondary | ICD-10-CM

## 2017-04-27 DIAGNOSIS — J810 Acute pulmonary edema: Secondary | ICD-10-CM

## 2017-04-27 DIAGNOSIS — I5031 Acute diastolic (congestive) heart failure: Secondary | ICD-10-CM

## 2017-04-27 LAB — APTT
APTT: 124 s — AB (ref 24–36)
APTT: 40 s — AB (ref 24–36)
aPTT: 84 seconds — ABNORMAL HIGH (ref 24–36)

## 2017-04-27 LAB — BASIC METABOLIC PANEL
Anion gap: 11 (ref 5–15)
BUN: 23 mg/dL — AB (ref 6–20)
CALCIUM: 8.2 mg/dL — AB (ref 8.9–10.3)
CO2: 32 mmol/L (ref 22–32)
CREATININE: 1.81 mg/dL — AB (ref 0.61–1.24)
Chloride: 98 mmol/L — ABNORMAL LOW (ref 101–111)
GFR calc Af Amer: 36 mL/min — ABNORMAL LOW (ref >60)
GFR, EST NON AFRICAN AMERICAN: 31 mL/min — AB (ref >60)
GLUCOSE: 135 mg/dL — AB (ref 65–99)
Potassium: 3.5 mmol/L (ref 3.5–5.1)
Sodium: 141 mmol/L (ref 135–145)

## 2017-04-27 LAB — GLUCOSE, CAPILLARY
GLUCOSE-CAPILLARY: 133 mg/dL — AB (ref 65–99)
GLUCOSE-CAPILLARY: 165 mg/dL — AB (ref 65–99)
Glucose-Capillary: 134 mg/dL — ABNORMAL HIGH (ref 65–99)
Glucose-Capillary: 119 mg/dL — ABNORMAL HIGH (ref 65–99)
Glucose-Capillary: 139 mg/dL — ABNORMAL HIGH (ref 65–99)
Glucose-Capillary: 160 mg/dL — ABNORMAL HIGH (ref 65–99)

## 2017-04-27 LAB — PHOSPHORUS: PHOSPHORUS: 3.4 mg/dL (ref 2.5–4.6)

## 2017-04-27 LAB — MAGNESIUM: Magnesium: 2.3 mg/dL (ref 1.7–2.4)

## 2017-04-27 LAB — CBC
HCT: 34.2 % — ABNORMAL LOW (ref 40.0–52.0)
Hemoglobin: 11.2 g/dL — ABNORMAL LOW (ref 13.0–18.0)
MCH: 31.9 pg (ref 26.0–34.0)
MCHC: 32.9 g/dL (ref 32.0–36.0)
MCV: 96.9 fL (ref 80.0–100.0)
PLATELETS: 176 10*3/uL (ref 150–440)
RBC: 3.52 MIL/uL — ABNORMAL LOW (ref 4.40–5.90)
RDW: 16.3 % — AB (ref 11.5–14.5)
WBC: 5.6 10*3/uL (ref 3.8–10.6)

## 2017-04-27 LAB — HEPARIN LEVEL (UNFRACTIONATED)
Heparin Unfractionated: 0.92 IU/mL — ABNORMAL HIGH (ref 0.30–0.70)
Heparin Unfractionated: 0.72 IU/mL — ABNORMAL HIGH (ref 0.30–0.70)

## 2017-04-27 MED ORDER — POTASSIUM CHLORIDE CRYS ER 20 MEQ PO TBCR
40.0000 meq | EXTENDED_RELEASE_TABLET | Freq: Once | ORAL | Status: DC
  Administered 2017-04-27: 40 meq via ORAL

## 2017-04-27 MED ORDER — POTASSIUM CHLORIDE CRYS ER 20 MEQ PO TBCR
40.0000 meq | EXTENDED_RELEASE_TABLET | Freq: Two times a day (BID) | ORAL | Status: AC
  Administered 2017-04-27 – 2017-04-28 (×3): 40 meq via ORAL
  Filled 2017-04-27 (×4): qty 2

## 2017-04-27 NOTE — Progress Notes (Addendum)
ANTICOAGULATION CONSULT NOTE - Initial Consult  Pharmacy Consult for heparin  Indication: atrial fibrillation  No Known Allergies  Patient Measurements: Height: 6' (182.9 cm) Weight: 237 lb 7 oz (107.7 kg) IBW/kg (Calculated) : 77.6 Heparin Dosing Weight: 97 kg   Vital Signs: Temp: 98.3 F (36.8 C) (12/24 1925) Temp Source: Oral (12/24 1925) BP: 112/52 (12/24 1925) Pulse Rate: 70 (12/24 1925)  Labs: Recent Labs    04/25/17 1404  04/25/17 2036  04/26/17 0212 04/26/17 0811 04/26/17 1201  04/27/17 0427 04/27/17 1305 04/27/17 2251  HGB 11.6*  --   --   --  10.7* 11.2*  --   --  11.2*  --   --   HCT 35.9*  --   --   --  33.2* 34.3*  --   --  34.2*  --   --   PLT 248  --   --   --  184 181  --   --  176  --   --   APTT  --    < > 59*  --  134*  --  71*   < > 124* 84* 40*  HEPARINUNFRC  --   --   --    < > 1.88*  --  1.52*  --  0.92* 0.72*  --   CREATININE 2.08*  --   --   --  1.85*  --   --   --  1.81*  --   --   TROPONINI <0.03  --  <0.03  --  <0.03 <0.03  --   --   --   --   --    < > = values in this interval not displayed.    Estimated Creatinine Clearance: 33.7 mL/min (A) (by C-G formula based on SCr of 1.81 mg/dL (H)).   Assessment: 83 year old patient with home medication of apixaban was brought into ED for being hypoxemic and SOB. Last apixaban dose was this AM. Starting heparin drip for AFib.   Goal of Therapy:  APTT 66 - 102 seconds Heparin level 0.3-0.7 units/ml Monitor platelets by anticoagulation protocol: Yes    Plan:  APTT = 71 seconds is therapeutic. Continue heparin drip at current rate of 1200 units/hr and will order confirmatory APTT in 8 hours.  Dosing off of APTT since baseline HL was elevated due to patient being on apixaban.  CBC ordered with AM labs tomorrow.  12/23:  APTT @ 20:21 = 90 Will continue pt on current rate and recheck aPTT and HL on 12/24 with AM labs.   12/24 @ 0500 HL 0.92, aPTT 124 sec, both supratherapeutic. Will decrease  heparin rate to 1000 units/hr and will recheck HL/aPTT @ 1300. CBC stable.  12/24 @ 1400 HL 0.72, aPTT 84 Will continue infusion at current rate and check a confirmatory aPTT at 2200 and aPTT/HL/CBC in AM.  Luisa HartScott Christy, PharmD Clinical Pharmacist  04/27/2017    12/24 2300 aPTT 40. Increase rate to 1100 units/hr. Recheck aPTT/CBC/HL with AM labs.  12/25 AM aPTT 60, heparin level 0.31. Increase rate to 1200 units/hr and recheck aPTT in 6 hours.  Fulton ReekMatt Nicci Vaughan, PharmD, BCPS  04/27/17 11:47 PM

## 2017-04-27 NOTE — Progress Notes (Signed)
Report called to nurse on 2A. Pt has progressed from BIPAP to HFNC to 6Lnc today. Sats good. Up in chair for 2 hrs. Dyspnea with exertion. Fine crackles bibasilar. Appetite fairly good. UOP good. Afib with PVC's. No c/o pain.  Wife made aware of transfer to room 249.

## 2017-04-27 NOTE — Progress Notes (Signed)
Per Dr. Sung AmabileSimonds, no need for cardiology consult.

## 2017-04-27 NOTE — Progress Notes (Signed)
[facility] - [place] [facility]   PATIENT NAME: Troy [name]    MR#:  [id number]  DATE OF BIRTH:  10-02-1923  SUBJECTIVE:  CHIEF COMPLAINT:   Chief Complaint  Patient presents with  . Shortness of Breath   Better shortness of breath and cough.  Off BiPAP, on HF O2 REVIEW OF SYSTEMS:  Review of Systems  Constitutional: Positive for malaise/fatigue. Negative for chills and fever.  HENT: Negative for sore throat.   Eyes: Negative for blurred vision and double vision.  Respiratory: Positive for cough and shortness of breath. Negative for hemoptysis, sputum production, wheezing and stridor.   Cardiovascular: Negative for chest pain, palpitations, orthopnea and leg swelling.  Gastrointestinal: Negative for abdominal pain, blood in stool, diarrhea, melena, nausea and vomiting.  Genitourinary: Negative for dysuria, flank pain and hematuria.  Musculoskeletal: Negative for back pain and joint pain.  Skin: Negative for rash.  Neurological: Positive for weakness. Negative for dizziness, sensory change, focal weakness, seizures, loss of consciousness and headaches.  Endo/Heme/Allergies: Negative for polydipsia.  Psychiatric/Behavioral: Negative for depression. The patient is not nervous/anxious.     DRUG ALLERGIES:  No Known Allergies VITALS:  Blood pressure 139/68, pulse 77, temperature 98 F (36.7 C), temperature source Axillary, resp. rate 15, height 6' (1.829 m), weight 237 lb 7 oz (107.7 kg), SpO2 94 %. PHYSICAL EXAMINATION:  Physical Exam  Constitutional: He is oriented to person, place, and time and well-developed, well-nourished, and in no distress.  Obesity.  HENT:  Head: Normocephalic.  Mouth/Throat: Oropharynx is clear and moist.  Eyes: Conjunctivae and EOM are normal. Pupils are equal, round, and reactive to light. No scleral icterus.  Neck: Normal range of motion. Neck supple. No JVD present. No tracheal deviation present.  Cardiovascular: Normal  rate, regular rhythm and normal heart sounds. Exam reveals no gallop.  No murmur heard. Pulmonary/Chest: Effort normal. No respiratory distress. He has no wheezes. He has rales.  Abdominal: Soft. Bowel sounds are normal. He exhibits no distension. There is no tenderness. There is no rebound.  Musculoskeletal: Normal range of motion. He exhibits no edema or tenderness.  Left heel in dressing.  Neurological: He is alert and oriented to person, place, and time. No cranial nerve deficit.  Skin: No rash noted. No erythema.  Psychiatric: Affect normal.   LABORATORY PANEL:  Male CBC Recent Labs  Lab 04/27/17 0427  WBC 5.6  HGB 11.2*  HCT 34.2*  PLT 176   ------------------------------------------------------------------------------------------------------------------ Chemistries  Recent Labs  Lab 04/25/17 1404  04/27/17 0427  NA 139   < > 141  K 3.8   < > 3.5  CL 95*   < > 98*  CO2 32   < > 32  GLUCOSE 191*   < > 135*  BUN 28*   < > 23*  CREATININE 2.08*   < > 1.81*  CALCIUM 8.7*   < > 8.2*  MG  --   --  2.3  AST 17  --   --   ALT <5*  --   --   ALKPHOS 76  --   --   BILITOT 1.5*  --   --    < > = values in this interval not displayed.   RADIOLOGY:  Dg Chest Port 1 View  Result Date: 04/27/2017 CLINICAL DATA:  Respiratory failure EXAM: PORTABLE CHEST 1 VIEW COMPARISON:  Yesterday FINDINGS: Low volume chest with interstitial coarsening that appears improved. Poor visualization of the left lower lobe, likely  primarily from patient's size and underpenetration. Status post mitral valve repair. Chronic cardiomegaly and vascular pedicle widening. Single chamber pacer from the left. IMPRESSION: 1. Question mild improvement in edema. 2. Low volume chest. 3. Opacified or obscured left lower lobe. Electronically Signed   By: Marnee SpringJonathon  Watts M.D.   On: 04/27/2017 09:10   ASSESSMENT AND PLAN:   Troy [name]  is a 83 y.o. male with a known history of chronic diastolic congestive heart  failure, chronic atrial fibrillation on amiodarone and ELIQUIS is brought into the ED from the nursing home for being hypoxemic and short of breath. Patient's pulse ox was 70% on 2 L of oxygen. EMS bumped up his oxygen and other brought him into the ED. Chest x-ray respiratory congestion. She was given IV Lasix and hospitalist team is called to admit the patient  #Acute hypoxic respiratory failure second to CHF exacerbation Off BiPAP, on HF, try to wean down on Garrett, DuoNeb as needed.   #Acute on chronic diastolic congestive heart failure, LV EF: 60% -   65% (03/10/2017 echo) IV Lasix Holding ACE inhibitor in view of acute renal insufficiency Resumed beta blocker.  #Acute kidney injury on chronic kidney disease Baseline creatinine seems to be at 1.27 F/u BMP while on lasix.  #Diabetes mellitus Sliding scale insulin.  #Chronic atrial fibrillation Rate controlled. On heparin gtt.  Generalized weakness.  PT evaluation when patient was stable. All the records are reviewed and case discussed with Care Management/Social Worker. Management plans discussed with the patient, his son and they are in agreement.  CODE STATUS: DNR  TOTAL TIME TAKING CARE OF THIS PATIENT: 33 minutes.   More than 50% of the time was spent in counseling/coordination of care: YES  POSSIBLE D/C IN 2-3 DAYS, DEPENDING ON CLINICAL CONDITION.   Shaune PollackQing Merial Moritz M.D on 04/27/2017 at 1:37 PM  Between 7am to 6pm - Pager - [phone]  After 6pm go to [email or web address] - Therapist, nutritionalpassword [email or web address]  [facility] [place] [facility]

## 2017-04-27 NOTE — Progress Notes (Signed)
Has transitioned from BiPAP to HFNC 35% - appears comfortable Cognition appears intact Denies pain  Vitals:   04/27/17 0500 04/27/17 0600 04/27/17 0800 04/27/17 1200  BP:  137/75 128/73   Pulse:  72 70   Resp:  13    Temp:   98.2 F (36.8 C) 98 F (36.7 C)  TempSrc:   Axillary Axillary  SpO2:  99% 97%   Weight: 107.7 kg (237 lb 7 oz)     Height:         Gen: NAD HEENT: NCAT, sclerae white, oropharynx normal Neck: JVP cannot be visualized Lungs: No wheezes, bibasilar crackles Cardiovascular: paced <> slow AF, no M noted Abdomen: Soft, NT, +BS Ext: Trace symmetric ankle and pedal edema Neuro: grossly intact   BMP Latest Ref Rng & Units 04/27/2017 04/26/2017 04/25/2017  Glucose 65 - 99 mg/dL 161(W135(H) 960(A139(H) 540(J191(H)  BUN 6 - 20 mg/dL 81(X23(H) 91(Y24(H) 78(G28(H)  Creatinine 0.61 - 1.24 mg/dL 9.56(O1.81(H) 1.30(Q1.85(H) 6.57(Q2.08(H)  Sodium 135 - 145 mmol/L 141 140 139  Potassium 3.5 - 5.1 mmol/L 3.5 3.5 3.8  Chloride 101 - 111 mmol/L 98(L) 98(L) 95(L)  CO2 22 - 32 mmol/L 32 36(H) 32  Calcium 8.9 - 10.3 mg/dL 8.2(L) 8.4(L) 8.7(L)    CBC Latest Ref Rng & Units 04/27/2017 04/26/2017 04/26/2017  WBC 3.8 - 10.6 K/uL 5.6 5.7 5.6  Hemoglobin 13.0 - 18.0 g/dL 11.2(L) 11.2(L) 10.7(L)  Hematocrit 40.0 - 52.0 % 34.2(L) 34.3(L) 33.2(L)  Platelets 150 - 440 K/uL 176 181 184    CXR: Cardiomegaly, vascular congestion, interstitial prominence, left lower lobe effusion versus atelectasis versus infiltrate  IMPRESSION: Acute hypoxemic respiratory failure Pulmonary edema pattern on chest x-ray Doubt pneumonia Chronic atrial fibrillation, rate controlled History of CHF due to diastolic dysfunction and valvular heart disease Type 2 diabetes CKD Advanced age DNR  PLAN/REC: Continue diuresis as permitted by BP and renal function Discontinue antibiotics Monitor and SDU.  Possible transfer out later today  Billy Fischeravid Simonds, MD PCCM service Mobile [phone] Pager [phone] 04/27/2017 12:26 PM

## 2017-04-27 NOTE — Progress Notes (Signed)
ANTICOAGULATION CONSULT NOTE - Initial Consult  Pharmacy Consult for heparin  Indication: atrial fibrillation  No Known Allergies  Patient Measurements: Height: 6' (182.9 cm) Weight: 237 lb 7 oz (107.7 kg) IBW/kg (Calculated) : 77.6 Heparin Dosing Weight: 97 kg   Vital Signs: Temp: 98.4 F (36.9 C) (12/24 0400) Temp Source: Axillary (12/24 0400) BP: 121/72 (12/24 0200) Pulse Rate: 70 (12/24 0200)  Labs: Recent Labs    04/25/17 1404  04/25/17 2036 04/26/17 0212 04/26/17 0811 04/26/17 1201 04/26/17 2021 04/27/17 0427  HGB 11.6*  --   --  10.7* 11.2*  --   --  11.2*  HCT 35.9*  --   --  33.2* 34.3*  --   --  34.2*  PLT 248  --   --  184 181  --   --  176  APTT  --    < > 59* 134*  --  71* 90* 124*  HEPARINUNFRC  --   --   --  1.88*  --  1.52*  --  0.92*  CREATININE 2.08*  --   --  1.85*  --   --   --  1.81*  TROPONINI <0.03  --  <0.03 <0.03 <0.03  --   --   --    < > = values in this interval not displayed.    Estimated Creatinine Clearance: 33.7 mL/min (A) (by C-G formula based on SCr of 1.81 mg/dL (H)).   Assessment: 83 year old patient with home medication of apixaban was brought into ED for being hypoxemic and SOB. Last apixaban dose was this AM. Starting heparin drip for AFib.   Goal of Therapy:  APTT 66 - 102 seconds Heparin level 0.3-0.7 units/ml Monitor platelets by anticoagulation protocol: Yes    Plan:  APTT = 71 seconds is therapeutic. Continue heparin drip at current rate of 1200 units/hr and will order confirmatory APTT in 8 hours.  Dosing off of APTT since baseline HL was elevated due to patient being on apixaban.  CBC ordered with AM labs tomorrow.  12/23:  APTT @ 20:21 = 90 Will continue pt on current rate and recheck aPTT and HL on 12/24 with AM labs.   12/24 @ 0500 HL 0.92, aPTT 124 sec, both supratherapeutic. Will decrease heparin rate to 1000 units/hr and will recheck HL/aPTT @ 1300. CBC stable.  Thomasene Rippleavid Seung Nidiffer, PharmD, BCPS Clinical  Pharmacist 04/27/2017

## 2017-04-27 NOTE — Progress Notes (Addendum)
ANTICOAGULATION CONSULT NOTE - Initial Consult  Pharmacy Consult for heparin  Indication: atrial fibrillation  No Known Allergies  Patient Measurements: Height: 6' (182.9 cm) Weight: 237 lb 7 oz (107.7 kg) IBW/kg (Calculated) : 77.6 Heparin Dosing Weight: 97 kg   Vital Signs: Temp: 98 F (36.7 C) (12/24 1200) Temp Source: Axillary (12/24 1200) BP: 139/68 (12/24 1200) Pulse Rate: 77 (12/24 1200)  Labs: Recent Labs    04/25/17 1404  04/25/17 2036  04/26/17 0212 04/26/17 0811 04/26/17 1201 04/26/17 2021 04/27/17 0427 04/27/17 1305  HGB 11.6*  --   --   --  10.7* 11.2*  --   --  11.2*  --   HCT 35.9*  --   --   --  33.2* 34.3*  --   --  34.2*  --   PLT 248  --   --   --  184 181  --   --  176  --   APTT  --    < > 59*  --  134*  --  71* 90* 124* 84*  HEPARINUNFRC  --   --   --    < > 1.88*  --  1.52*  --  0.92* 0.72*  CREATININE 2.08*  --   --   --  1.85*  --   --   --  1.81*  --   TROPONINI <0.03  --  <0.03  --  <0.03 <0.03  --   --   --   --    < > = values in this interval not displayed.    Estimated Creatinine Clearance: 33.7 mL/min (A) (by C-G formula based on SCr of 1.81 mg/dL (H)).   Assessment: 83 year old patient with home medication of apixaban was brought into ED for being hypoxemic and SOB. Last apixaban dose was this AM. Starting heparin drip for AFib.   Goal of Therapy:  APTT 66 - 102 seconds Heparin level 0.3-0.7 units/ml Monitor platelets by anticoagulation protocol: Yes    Plan:  APTT = 71 seconds is therapeutic. Continue heparin drip at current rate of 1200 units/hr and will order confirmatory APTT in 8 hours.  Dosing off of APTT since baseline HL was elevated due to patient being on apixaban.  CBC ordered with AM labs tomorrow.  12/23:  APTT @ 20:21 = 90 Will continue pt on current rate and recheck aPTT and HL on 12/24 with AM labs.   12/24 @ 0500 HL 0.92, aPTT 124 sec, both supratherapeutic. Will decrease heparin rate to 1000 units/hr and  will recheck HL/aPTT @ 1300. CBC stable.  12/24 @ 1400 HL 0.72, aPTT 84 Will continue infusion at current rate and check a confirmatory aPTT at 2200 and aPTT/HL/CBC in AM.  Luisa HartScott Cobain Morici, PharmD Clinical Pharmacist  04/27/2017

## 2017-04-28 LAB — CBC
HCT: 36.6 % — ABNORMAL LOW (ref 40.0–52.0)
HEMOGLOBIN: 11.8 g/dL — AB (ref 13.0–18.0)
MCH: 31.1 pg (ref 26.0–34.0)
MCHC: 32.3 g/dL (ref 32.0–36.0)
MCV: 96.2 fL (ref 80.0–100.0)
Platelets: 185 10*3/uL (ref 150–440)
RBC: 3.81 MIL/uL — AB (ref 4.40–5.90)
RDW: 16 % — ABNORMAL HIGH (ref 11.5–14.5)
WBC: 7.1 10*3/uL (ref 3.8–10.6)

## 2017-04-28 LAB — GLUCOSE, CAPILLARY
Glucose-Capillary: 173 mg/dL — ABNORMAL HIGH (ref 65–99)
Glucose-Capillary: 199 mg/dL — ABNORMAL HIGH (ref 65–99)
Glucose-Capillary: 166 mg/dL — ABNORMAL HIGH (ref 65–99)
Glucose-Capillary: 156 mg/dL — ABNORMAL HIGH (ref 65–99)

## 2017-04-28 LAB — BASIC METABOLIC PANEL
ANION GAP: 9 (ref 5–15)
BUN: 21 mg/dL — ABNORMAL HIGH (ref 6–20)
CALCIUM: 8.7 mg/dL — AB (ref 8.9–10.3)
CO2: 30 mmol/L (ref 22–32)
Chloride: 99 mmol/L — ABNORMAL LOW (ref 101–111)
Creatinine, Ser: 1.86 mg/dL — ABNORMAL HIGH (ref 0.61–1.24)
GFR, EST AFRICAN AMERICAN: 35 mL/min — AB (ref >60)
GFR, EST NON AFRICAN AMERICAN: 30 mL/min — AB (ref >60)
Glucose, Bld: 170 mg/dL — ABNORMAL HIGH (ref 65–99)
Potassium: 4.2 mmol/L (ref 3.5–5.1)
SODIUM: 138 mmol/L (ref 135–145)

## 2017-04-28 LAB — HEPARIN LEVEL (UNFRACTIONATED): HEPARIN UNFRACTIONATED: 0.31 [IU]/mL (ref 0.30–0.70)

## 2017-04-28 LAB — APTT: APTT: 60 s — AB (ref 24–36)

## 2017-04-28 MED ORDER — AMLODIPINE BESYLATE 5 MG PO TABS: 5.0000 mg | ORAL_TABLET | Freq: Every day | ORAL | Status: DC

## 2017-04-28 MED ORDER — TAMSULOSIN HCL 0.4 MG PO CAPS
0.4000 mg | ORAL_CAPSULE | Freq: Every day | ORAL | Status: DC
  Administered 2017-04-28 – 2017-04-30 (×3): 0.4 mg via ORAL
  Filled 2017-04-28 (×2): qty 1

## 2017-04-28 MED ORDER — BUDESONIDE 0.5 MG/2ML IN SUSP
0.5000 mg | Freq: Two times a day (BID) | RESPIRATORY_TRACT | Status: DC
  Administered 2017-04-28 – 2017-04-30 (×4): 0.5 mg via RESPIRATORY_TRACT
  Filled 2017-04-28 (×4): qty 2

## 2017-04-28 MED ORDER — HYDRALAZINE HCL 20 MG/ML IJ SOLN: 10.0000 mg | Freq: Four times a day (QID) | INTRAMUSCULAR | Status: DC | PRN

## 2017-04-28 MED ORDER — GABAPENTIN 100 MG PO CAPS
100.0000 mg | ORAL_CAPSULE | Freq: Three times a day (TID) | ORAL | Status: DC
  Administered 2017-04-28 – 2017-04-30 (×5): 100 mg via ORAL
  Filled 2017-04-28 (×5): qty 1

## 2017-04-28 MED ORDER — AMIODARONE HCL 200 MG PO TABS
200.0000 mg | ORAL_TABLET | Freq: Two times a day (BID) | ORAL | Status: DC
  Administered 2017-04-28 – 2017-04-30 (×4): 200 mg via ORAL
  Filled 2017-04-28 (×4): qty 1

## 2017-04-28 MED ORDER — APIXABAN 2.5 MG PO TABS
2.5000 mg | ORAL_TABLET | Freq: Two times a day (BID) | ORAL | Status: DC
  Administered 2017-04-28 – 2017-04-30 (×5): 2.5 mg via ORAL
  Filled 2017-04-28 (×5): qty 1

## 2017-04-28 MED ORDER — AMLODIPINE BESYLATE 5 MG PO TABS
5.0000 mg | ORAL_TABLET | Freq: Every day | ORAL | Status: DC
  Administered 2017-04-28: 5 mg via ORAL

## 2017-04-28 MED ORDER — LORATADINE 10 MG PO TABS
10.0000 mg | ORAL_TABLET | Freq: Every day | ORAL | Status: DC
  Administered 2017-04-29 – 2017-04-30 (×2): 10 mg via ORAL
  Filled 2017-04-28 (×2): qty 1

## 2017-04-28 MED ORDER — FAMOTIDINE 20 MG PO TABS
40.0000 mg | ORAL_TABLET | Freq: Every day | ORAL | Status: DC
  Administered 2017-04-29 – 2017-04-30 (×2): 40 mg via ORAL
  Filled 2017-04-28 (×2): qty 2

## 2017-04-28 MED ORDER — POTASSIUM CHLORIDE CRYS ER 10 MEQ PO TBCR
10.0000 meq | EXTENDED_RELEASE_TABLET | Freq: Every day | ORAL | Status: DC
  Administered 2017-04-28 – 2017-04-30 (×3): 10 meq via ORAL
  Filled 2017-04-28 (×2): qty 1

## 2017-04-28 MED ORDER — CARBIDOPA-LEVODOPA 25-250 MG PO TABS
1.0000 | ORAL_TABLET | Freq: Four times a day (QID) | ORAL | Status: DC
  Administered 2017-04-28 – 2017-04-30 (×6): 1 via ORAL
  Filled 2017-04-28 (×9): qty 1

## 2017-04-28 NOTE — Progress Notes (Signed)
ANTICOAGULATION CONSULT NOTE - Initial Consult  Pharmacy Consult for heparin  Indication: atrial fibrillation  No Known Allergies  Patient Measurements: Height: 6' (182.9 cm) Weight: 235 lb 12.8 oz (107 kg) IBW/kg (Calculated) : 77.6 Heparin Dosing Weight: 97 kg   Vital Signs: Temp: 98 F (36.7 C) (12/25 0423) BP: 161/70 (12/25 0423) Pulse Rate: 73 (12/25 0423)  Labs: Recent Labs    04/25/17 2036 04/26/17 0212 04/26/17 0811  04/27/17 0427 04/27/17 1305 04/27/17 2251 04/28/17 0513  HGB  --  10.7* 11.2*  --  11.2*  --   --  11.8*  HCT  --  33.2* 34.3*  --  34.2*  --   --  36.6*  PLT  --  184 181  --  176  --   --  185  APTT 59* 134*  --    < > 124* 84* 40* 60*  HEPARINUNFRC  --  1.88*  --    < > 0.92* 0.72*  --  0.31  CREATININE  --  1.85*  --   --  1.81*  --   --  1.86*  TROPONINI <0.03 <0.03 <0.03  --   --   --   --   --    < > = values in this interval not displayed.    Estimated Creatinine Clearance: 32.7 mL/min (A) (by C-G formula based on SCr of 1.86 mg/dL (H)).   Assessment: 83 year old patient with home medication of apixaban was brought into ED for being hypoxemic and SOB. Last apixaban dose was this AM. Starting heparin drip for AFib.   Goal of Therapy:  APTT 66 - 102 seconds Heparin level 0.3-0.7 units/ml Monitor platelets by anticoagulation protocol: Yes    Plan:  APTT = 71 seconds is therapeutic. Continue heparin drip at current rate of 1200 units/hr and will order confirmatory APTT in 8 hours.  Dosing off of APTT since baseline HL was elevated due to patient being on apixaban.  CBC ordered with AM labs tomorrow.  12/23:  APTT @ 20:21 = 90 Will continue pt on current rate and recheck aPTT and HL on 12/24 with AM labs.   12/24 @ 0500 HL 0.92, aPTT 124 sec, both supratherapeutic. Will decrease heparin rate to 1000 units/hr and will recheck HL/aPTT @ 1300. CBC stable.  12/24 @ 1400 HL 0.72, aPTT 84 Will continue infusion at current rate and check a  confirmatory aPTT at 2200 and aPTT/HL/CBC in AM.  Luisa HartScott Christy, PharmD Clinical Pharmacist  04/28/2017    12/24 2300 aPTT 40. Increase rate to 1100 units/hr. Recheck aPTT/CBC/HL with AM labs.  12/25 AM aPTT 60, heparin level 0.31. Increase rate to 1200 units/hr and recheck aPTT in 6 hours.  Fulton ReekMatt McBane, PharmD, BCPS  04/28/17 7:25 AM    12/25 @0730  Stopping heparin gtt today at 0930, starting apixaban 2.5mg  po bid   Luan PullingGarrett Clayton Jarmon, PharmD, MBA, Liz ClaiborneBCGP Clinical Pharmacist [facility]

## 2017-04-28 NOTE — Plan of Care (Signed)
  Progressing Clinical Measurements: Ability to maintain clinical measurements within normal limits will improve 04/28/2017 1231 - Progressing by Myles GipKimrey, Aleda Madl M, RN Will remain free from infection 04/28/2017 1231 - Progressing by Myles GipKimrey, Roanne Haye M, RN Diagnostic test results will improve 04/28/2017 1231 - Progressing by Myles GipKimrey, Kanaya Gunnarson M, RN Respiratory complications will improve 04/28/2017 1231 - Progressing by Myles GipKimrey, Zakariyya Helfman M, RN Cardiovascular complication will be avoided 04/28/2017 1231 - Progressing by Myles GipKimrey, Garrick Midgley M, RN Nutrition: Adequate nutrition will be maintained 04/28/2017 1231 - Progressing by Myles GipKimrey, Lyrah Bradt M, RN Pain Managment: General experience of comfort will improve 04/28/2017 1231 - Progressing by Myles GipKimrey, Godwin Tedesco M, RN Safety: Ability to remain free from injury will improve 04/28/2017 1231 - Progressing by Myles GipKimrey, Aaleeyah Bias M, RN

## 2017-04-28 NOTE — Plan of Care (Signed)
  Progressing Education: Knowledge of General Education information will improve 04/28/2017 0559 - Progressing by Dorna LeitzNesbitt, Keidy Thurgood M, RN Safety: Ability to remain free from injury will improve 04/28/2017 0559 - Progressing by Dorna LeitzNesbitt, Nijae Doyel M, RN Cardiac: Ability to achieve and maintain adequate cardiopulmonary perfusion will improve 04/28/2017 0559 - Progressing by Dorna LeitzNesbitt, Jalen Daluz M, RN

## 2017-04-28 NOTE — Progress Notes (Addendum)
[facility] - [place] at [facility]   PATIENT NAME: Troy [name]    MR#:  [id number]  DATE OF BIRTH:  05-10-1923  SUBJECTIVE:  CHIEF COMPLAINT:   Chief Complaint  Patient presents with  . Shortness of Breath   Better shortness of breath and cough.  Off BiPAP, on O2 [place] 5 L. REVIEW OF SYSTEMS:  Review of Systems  Constitutional: Positive for malaise/fatigue. Negative for chills and fever.  HENT: Negative for sore throat.   Eyes: Negative for blurred vision and double vision.  Respiratory: Positive for cough and shortness of breath. Negative for hemoptysis, sputum production, wheezing and stridor.   Cardiovascular: Negative for chest pain, palpitations, orthopnea and leg swelling.  Gastrointestinal: Negative for abdominal pain, blood in stool, diarrhea, melena, nausea and vomiting.  Genitourinary: Negative for dysuria, flank pain and hematuria.  Musculoskeletal: Negative for back pain and joint pain.  Skin: Negative for rash.  Neurological: Positive for weakness. Negative for dizziness, sensory change, focal weakness, seizures, loss of consciousness and headaches.  Endo/Heme/Allergies: Negative for polydipsia.  Psychiatric/Behavioral: Negative for depression. The patient is not nervous/anxious.     DRUG ALLERGIES:  No Known Allergies VITALS:  Blood pressure (!) 144/77, pulse 81, temperature 98 F (36.7 C), resp. rate 20, height 6' (1.829 m), weight 235 lb 12.8 oz (107 kg), SpO2 98 %. PHYSICAL EXAMINATION:  Physical Exam  Constitutional: He is oriented to person, place, and time and well-developed, well-nourished, and in no distress.  Obesity.  HENT:  Head: Normocephalic.  Mouth/Throat: Oropharynx is clear and moist.  Eyes: Conjunctivae and EOM are normal. Pupils are equal, round, and reactive to light. No scleral icterus.  Neck: Normal range of motion. Neck supple. No JVD present. No tracheal deviation present.  Cardiovascular: Normal rate, regular  rhythm and normal heart sounds. Exam reveals no gallop.  No murmur heard. Pulmonary/Chest: Effort normal. No respiratory distress. He has no wheezes. He has rales.  Abdominal: Soft. Bowel sounds are normal. He exhibits no distension. There is no tenderness. There is no rebound.  Musculoskeletal: Normal range of motion. He exhibits no edema or tenderness.  Left heel in dressing.  Neurological: He is alert and oriented to person, place, and time. No cranial nerve deficit.  Skin: No rash noted. No erythema.  Psychiatric: Affect normal.   LABORATORY PANEL:  Male CBC Recent Labs  Lab 04/28/17 0513  WBC 7.1  HGB 11.8*  HCT 36.6*  PLT 185   ------------------------------------------------------------------------------------------------------------------ Chemistries  Recent Labs  Lab 04/25/17 1404  04/27/17 0427 04/28/17 0513  NA 139   < > 141 138  K 3.8   < > 3.5 4.2  CL 95*   < > 98* 99*  CO2 32   < > 32 30  GLUCOSE 191*   < > 135* 170*  BUN 28*   < > 23* 21*  CREATININE 2.08*   < > 1.81* 1.86*  CALCIUM 8.7*   < > 8.2* 8.7*  MG  --   --  2.3  --   AST 17  --   --   --   ALT <5*  --   --   --   ALKPHOS 76  --   --   --   BILITOT 1.5*  --   --   --    < > = values in this interval not displayed.   RADIOLOGY:  No results found. ASSESSMENT AND PLAN:   Troy [name]  is a 83 y.o.  male with a known history of chronic diastolic congestive heart failure, chronic atrial fibrillation on amiodarone and ELIQUIS is brought into the ED from the nursing home for being hypoxemic and short of breath. Patient's pulse ox was 70% on 2 L of oxygen. EMS bumped up his oxygen and other brought him into the ED. Chest x-ray respiratory congestion. She was given IV Lasix and hospitalist team is called to admit the patient  #Acute on chronic hypoxic respiratory failure second to CHF exacerbation Off BiPAP, on O [place], try to wean down to baseline 2 L, DuoNeb as needed.  Resume Pulmicort.  #Acute on  chronic diastolic congestive heart failure, LV EF: 60% -   65% (03/10/2017 echo) Continue IV Lasix Holding ACE inhibitor in view of acute renal insufficiency Resumed beta blocker.  #Acute kidney injury on chronic kidney disease Baseline creatinine seems to be at 1.27 F/u BMP while on lasix.  #Diabetes mellitus Sliding scale insulin.  #Chronic atrial fibrillation Rate controlled. Discontinue heparin drip and resume Eliquis.  Resume amiodarone.  Left heel ulcer, stage 2, wound care.  Generalized weakness.  PT evaluation is pending. All the records are reviewed and case discussed with Care Management/Social Worker. Management plans discussed with the patient, his son and they are in agreement.  CODE STATUS: DNR  TOTAL TIME TAKING CARE OF THIS PATIENT: 33 minutes.   More than 50% of the time was spent in counseling/coordination of care: YES  POSSIBLE D/C IN 2-3 DAYS, DEPENDING ON CLINICAL CONDITION.   Shaune PollackQing Milika Ventress M.D on 04/28/2017 at 11:25 AM  Between 7am to 6pm - Pager - [phone]  After 6pm go to [email or web address] - Therapist, nutritionalpassword EPAS [facility]  [facility] [place] [facility]

## 2017-04-28 NOTE — Progress Notes (Addendum)
Pt foley was removed at 7:20am peed around 9:30 am. We didn't get the chance to measure it because pt forgot to use his urinal. Will continue to monitor

## 2017-04-29 LAB — BASIC METABOLIC PANEL
Anion gap: 7 (ref 5–15)
BUN: 18 mg/dL (ref 6–20)
CHLORIDE: 101 mmol/L (ref 101–111)
CO2: 30 mmol/L (ref 22–32)
CREATININE: 1.5 mg/dL — AB (ref 0.61–1.24)
Calcium: 8.8 mg/dL — ABNORMAL LOW (ref 8.9–10.3)
GFR calc Af Amer: 45 mL/min — ABNORMAL LOW (ref >60)
GFR calc non Af Amer: 39 mL/min — ABNORMAL LOW (ref >60)
Glucose, Bld: 149 mg/dL — ABNORMAL HIGH (ref 65–99)
Potassium: 4 mmol/L (ref 3.5–5.1)
Sodium: 138 mmol/L (ref 135–145)

## 2017-04-29 LAB — GLUCOSE, CAPILLARY
GLUCOSE-CAPILLARY: 204 mg/dL — AB (ref 65–99)
GLUCOSE-CAPILLARY: 156 mg/dL — AB (ref 65–99)
Glucose-Capillary: 156 mg/dL — ABNORMAL HIGH (ref 65–99)
Glucose-Capillary: 212 mg/dL — ABNORMAL HIGH (ref 65–99)

## 2017-04-29 LAB — MAGNESIUM: Magnesium: 2.2 mg/dL (ref 1.7–2.4)

## 2017-04-29 MED ORDER — FUROSEMIDE 40 MG PO TABS
40.0000 mg | ORAL_TABLET | Freq: Every day | ORAL | Status: DC
  Administered 2017-04-29: 40 mg via ORAL
  Filled 2017-04-29: qty 1

## 2017-04-29 MED ORDER — FUROSEMIDE 40 MG PO TABS: 40.0000 mg | ORAL_TABLET | Freq: Once | ORAL | Status: DC

## 2017-04-29 MED ORDER — FUROSEMIDE 40 MG PO TABS
40.0000 mg | ORAL_TABLET | Freq: Two times a day (BID) | ORAL | Status: DC
  Administered 2017-04-29 – 2017-04-30 (×2): 40 mg via ORAL
  Filled 2017-04-29 (×2): qty 1

## 2017-04-29 NOTE — Progress Notes (Signed)
[facility] - [place] at [facility]   PATIENT NAME: Troy [name]    MR#:  [id number]  DATE OF BIRTH:  May 13, 1923  SUBJECTIVE:  CHIEF COMPLAINT:   Chief Complaint  Patient presents with  . Shortness of Breath   The patient is a sleepy. on O2 [place] 3 L. REVIEW OF SYSTEMS:  Review of Systems  Constitutional: Positive for malaise/fatigue. Negative for chills and fever.  HENT: Negative for sore throat.   Eyes: Negative for blurred vision and double vision.  Respiratory: Positive for cough and shortness of breath. Negative for hemoptysis, sputum production, wheezing and stridor.   Cardiovascular: Negative for chest pain, palpitations, orthopnea and leg swelling.  Gastrointestinal: Negative for abdominal pain, blood in stool, diarrhea, melena, nausea and vomiting.  Genitourinary: Negative for dysuria, flank pain and hematuria.  Musculoskeletal: Negative for back pain and joint pain.  Skin: Negative for rash.  Neurological: Positive for weakness. Negative for dizziness, sensory change, focal weakness, seizures, loss of consciousness and headaches.  Endo/Heme/Allergies: Negative for polydipsia.  Psychiatric/Behavioral: Negative for depression. The patient is not nervous/anxious.     DRUG ALLERGIES:  No Known Allergies VITALS:  Blood pressure 138/63, pulse 73, temperature 97.9 F (36.6 C), temperature source Oral, resp. rate 18, height 6' (1.829 m), weight 235 lb 12.8 oz (107 kg), SpO2 92 %. PHYSICAL EXAMINATION:  Physical Exam  Constitutional: He is oriented to person, place, and time and well-developed, well-nourished, and in no distress.  Obesity.  HENT:  Head: Normocephalic.  Mouth/Throat: Oropharynx is clear and moist.  Eyes: Conjunctivae and EOM are normal. Pupils are equal, round, and reactive to light. No scleral icterus.  Neck: Normal range of motion. Neck supple. No JVD present. No tracheal deviation present.  Cardiovascular: Normal rate, regular rhythm  and normal heart sounds. Exam reveals no gallop.  No murmur heard. Pulmonary/Chest: Effort normal. No respiratory distress. He has no wheezes. He has rales.  Abdominal: Soft. Bowel sounds are normal. He exhibits no distension. There is no tenderness. There is no rebound.  Musculoskeletal: Normal range of motion. He exhibits no edema or tenderness.  Left heel in dressing.  Neurological: He is alert and oriented to person, place, and time. No cranial nerve deficit.  Skin: No rash noted. No erythema.  Psychiatric: Affect normal.   LABORATORY PANEL:  Male CBC Recent Labs  Lab 04/28/17 0513  WBC 7.1  HGB 11.8*  HCT 36.6*  PLT 185   ------------------------------------------------------------------------------------------------------------------ Chemistries  Recent Labs  Lab 04/25/17 1404  04/29/17 0423  NA 139   < > 138  K 3.8   < > 4.0  CL 95*   < > 101  CO2 32   < > 30  GLUCOSE 191*   < > 149*  BUN 28*   < > 18  CREATININE 2.08*   < > 1.50*  CALCIUM 8.7*   < > 8.8*  MG  --    < > 2.2  AST 17  --   --   ALT <5*  --   --   ALKPHOS 76  --   --   BILITOT 1.5*  --   --    < > = values in this interval not displayed.   RADIOLOGY:  No results found. ASSESSMENT AND PLAN:   Troy [name]  is a 83 y.o. male with a known history of chronic diastolic congestive heart failure, chronic atrial fibrillation on amiodarone and ELIQUIS is brought into the ED from the nursing  home for being hypoxemic and short of breath. Patient's pulse ox was 70% on 2 L of oxygen. EMS bumped up his oxygen and other brought him into the ED. Chest x-ray respiratory congestion. She was given IV Lasix and hospitalist team is called to admit the patient  #Acute on chronic hypoxic respiratory failure second to CHF exacerbation Off BiPAP, on O [place], weaned down to baseline 2 L, DuoNeb as needed.  Resumed Pulmicort.  #Acute on chronic diastolic congestive heart failure, LV EF: 60% -   65% (03/10/2017  echo) Change to po Lasix bid. Holding ACE inhibitor in view of acute renal insufficiency Resumed beta blocker.  #Acute kidney injury on chronic kidney disease Baseline creatinine seems to be at 1.27 F/u BMP while on lasix.  #Diabetes mellitus Sliding scale insulin.  #Chronic atrial fibrillation Rate controlled. Discontinue heparin drip and resumed Eliquis.  Resumed amiodarone.  Left heel ulcer, stage 2, wound care.  HTN. Continue lasix.  Generalized weakness.  PT evaluation is pending. All the records are reviewed and case discussed with Care Management/Social Worker. Management plans discussed with the patient, his son and they are in agreement.  CODE STATUS: DNR  TOTAL TIME TAKING CARE OF THIS PATIENT: 30 minutes.   More than 50% of the time was spent in counseling/coordination of care: YES  POSSIBLE D/C IN 2 DAYS, DEPENDING ON CLINICAL CONDITION.   Shaune PollackQing Danzig Macgregor M.D on 04/29/2017 at 1:46 PM  Between 7am to 6pm - Pager - [phone]  After 6pm go to [email or web address] - Therapist, nutritionalpassword EPAS [facility]  [facility] [place] [facility]

## 2017-04-29 NOTE — Progress Notes (Signed)
PT Cancellation Note  Patient Details Name: Troy BurnRobert [name] MRN: [id number] DOB: Sep 21, 1923   Cancelled Treatment:    Reason Eval/Treat Not Completed: Medical issues which prohibited therapy. Patient has decreased O2 saturation 83% and is CI at this time.   [address], [name], [place] DPT 04/29/2017, 4:17 PM

## 2017-04-29 NOTE — Care Management Important Message (Signed)
Important Message  Patient Details  Name: Troy BurnRobert [name] MRN: [id number] Date of Birth: 1923-07-13   Medicare Important Message Given:  Yes Signed IM notice given    Eber HongGreene, Fusae Florio R, RN 04/29/2017, 2:25 PM

## 2017-04-29 NOTE — Consult Note (Signed)
WOC Nurse wound consult note Reason for Consult: Full thickness skin injury (deep tissue injury) to left heel, present on admission.  Wound type: Pressure injury Pressure Injury POA: Yes Measurement:left heel:  2 cm x 2 cm intact, boggy  Wound ZOX:WRUEAVbed:intact skin Drainage (amount, consistency, odor) none Periwound: intact Dressing procedure/placement/frequency: Cleanse left heel with soap and water and pat dry.  Offload pressure to left heel.  Heel silicone foam in place.  Will add Prevalon offloading boot. Will not follow at this time.  Please re-consult if needed.  Maple HudsonKaren Saleem Coccia RN BSN CWON Pager [phone]

## 2017-04-29 NOTE — Progress Notes (Signed)
Pt had no complaints duration of shift. Will conrinue to monitor.

## 2017-04-29 NOTE — Plan of Care (Signed)
  Progressing Education: Knowledge of General Education information will improve 04/29/2017 1139 - Progressing by Myles GipKimrey, Shawnda Mauney M, RN Clinical Measurements: Will remain free from infection 04/29/2017 1139 - Progressing by Myles GipKimrey, Amen Staszak M, RN Coping: Level of anxiety will decrease 04/29/2017 1139 - Progressing by Myles GipKimrey, Hang Ammon M, RN Pain Managment: General experience of comfort will improve 04/29/2017 1139 - Progressing by Myles GipKimrey, Ariza Evans M, RN Skin Integrity: Risk for impaired skin integrity will decrease 04/29/2017 1139 - Progressing by Myles GipKimrey, Jurnie Garritano M, RN

## 2017-04-30 LAB — BASIC METABOLIC PANEL
ANION GAP: 8 (ref 5–15)
BUN: 19 mg/dL (ref 6–20)
CALCIUM: 8.6 mg/dL — AB (ref 8.9–10.3)
CO2: 33 mmol/L — AB (ref 22–32)
CREATININE: 1.56 mg/dL — AB (ref 0.61–1.24)
Chloride: 98 mmol/L — ABNORMAL LOW (ref 101–111)
GFR, EST AFRICAN AMERICAN: 43 mL/min — AB (ref >60)
GFR, EST NON AFRICAN AMERICAN: 37 mL/min — AB (ref >60)
Glucose, Bld: 170 mg/dL — ABNORMAL HIGH (ref 65–99)
Potassium: 3.9 mmol/L (ref 3.5–5.1)
SODIUM: 139 mmol/L (ref 135–145)

## 2017-04-30 LAB — GLUCOSE, CAPILLARY
Glucose-Capillary: 154 mg/dL — ABNORMAL HIGH (ref 65–99)
Glucose-Capillary: 202 mg/dL — ABNORMAL HIGH (ref 65–99)

## 2017-04-30 MED ORDER — FUROSEMIDE 40 MG PO TABS: 40.0000 mg | ORAL_TABLET | Freq: Two times a day (BID) | ORAL | 1 refills | Status: AC

## 2017-04-30 NOTE — Plan of Care (Signed)
Patient needs to placed in recliner for meals and while awake. Patient is wheel chair bound.

## 2017-04-30 NOTE — Clinical Social Work Note (Signed)
[facility] to [facility].  Patient and family agreeable to plans will transport via ems RN to call report.  CSW left message on patient's wife voice mail and also on patient's son's voice mail.  Windell MouldingEric Sady Monaco, MSW, Theresia MajorsLCSWA [phone]

## 2017-04-30 NOTE — Consult Note (Addendum)
Went to speak with pt about CHF. Pt already w/ "living better w/ heart failure" booklet in room. Pt is to be d/c back to nursing home. Pt states they were not weighing him at facility. Explained the importance of daily weights and that the facility was asked to do this in his discharge paperwork. Reviewed new medication-furosemide. Discussed use and side effects. Also discussed apixaban-reason for use and side effects Discussed s/sx of bleeding and fluid overload and what to report to facility. Reminded pt of his appointment with the heart failure clinic and to make follow up with his cardiologist as well  Olene FlossMelissa D Kory Rains, Pharm.D, BCPS Clinical Pharmacist

## 2017-04-30 NOTE — [place] FL2 (Signed)
[place] MEDICAID FL2 LEVEL OF CARE SCREENING TOOL     IDENTIFICATION  Patient Name: Troy BurnRobert [name] Birthdate: Jan 29, 1924 Sex: male Admission Date (Current Location): 04/25/2017  [place]   Facility and Address:  [facility], [address], [place], [place] [address]      Provider Number: [id number]  Attending Physician Name and Address:  Shaune Pollackhen, Qing, MD  Relative Name and Phone Number:  Denton ArGilmore,Edith N Spouse [phone]  [phone] or Jamesetta OrleansGilmore,Randy Son [phone]  [phone]  or Lawrence MarseillesGilmore,Joel Son [phone]  [phone]     Current Level of Care: Hospital Recommended Level of Care: Skilled Nursing Facility Prior Approval Number:    Date Approved/Denied:   PASRR Number: [id number]  Discharge Plan: SNF    Current Diagnoses: Patient Active Problem List   Diagnosis Date Noted  . Acute CHF (congestive heart failure) (HCC) 04/25/2017  . Pressure injury of skin 03/16/2017  . Acute kidney injury (HCC) 03/10/2017  . Acute respiratory failure (HCC) 03/09/2017  . Acute on chronic respiratory failure (HCC) 05/09/2016  . Dehydration 03/21/2016  . Hypotension 03/21/2016  . Fall 03/21/2016  . Chest wall contusion, left, initial encounter 03/21/2016  . Hypoxia 03/21/2016  . Generalized weakness 03/21/2016  . Leukocytosis 03/21/2016  . Acute on chronic renal failure (HCC) 03/19/2016  . Acute exacerbation of chronic obstructive pulmonary disease (COPD) (HCC) 12/07/2015  . Acute on chronic respiratory failure with hypoxia (HCC) 09/28/2015  . HTN (hypertension) 09/25/2015  . Parkinson's disease (HCC) 09/25/2015  . Hypothyroidism 09/25/2015  . COPD exacerbation (HCC) 09/12/2015  . Diabetes (HCC) 08/20/2015  . SSS (sick sinus syndrome) (HCC) 08/07/2015  . Chronic atrial fibrillation (HCC)   . Sinus pause   . Sick sinus syndrome (HCC)   . Acute on chronic diastolic CHF (congestive heart failure), NYHA class 1  (HCC) 08/03/2015  . Lower extremity weakness 02/25/2015    Orientation RESPIRATION BLADDER Height & Weight     Self, Situation, Place  O2(2L) Incontinent Weight: (bed is messed up can't weigh at this time) Height:  6' (182.9 cm)  BEHAVIORAL SYMPTOMS/MOOD NEUROLOGICAL BOWEL NUTRITION STATUS      Continent Diet(Carb modified)  AMBULATORY STATUS COMMUNICATION OF NEEDS Skin   Extensive Assist Verbally PU Stage and Appropriate Care   PU Stage 2 Dressing: (Every 3 days)                   Personal Care Assistance Level of Assistance  Bathing, Feeding, Dressing Bathing Assistance: Limited assistance Feeding assistance: Independent Dressing Assistance: Limited assistance     Functional Limitations Info  Sight, Hearing, Speech Sight Info: Adequate Hearing Info: Adequate Speech Info: Adequate    SPECIAL CARE FACTORS FREQUENCY  PT (By licensed PT)     PT Frequency: 5x a week              Contractures Contractures Info: Not present    Additional Factors Info  Code Status, Allergies, Insulin Sliding Scale Code Status Info: DNR Allergies Info: NKA   Insulin Sliding Scale Info: insulin aspart (novoLOG) injection 0-9 Units 3x a day with meals       Current Medications (04/30/2017):  This is the current hospital active medication list Current Facility-Administered Medications  Medication Dose Route Frequency Provider Last Rate Last Dose  . 0.9 %  sodium chloride infusion  250 mL Intravenous PRN Ramonita LabGouru, Aruna, MD   Stopped at 04/26/17 0300  . acetaminophen (TYLENOL) tablet 650 mg  650 mg Oral Q4H PRN  Gouru, Deanna ArtisAruna, MD      . albuterol (PROVENTIL) (2.5 MG/3ML) 0.083% nebulizer solution 2.5 mg  2.5 mg Nebulization Q4H PRN Gouru, Aruna, MD      . amiodarone (PACERONE) tablet 200 mg  200 mg Oral BID Shaune Pollackhen, Qing, MD   200 mg at 04/30/17 [id number]  . apixaban (ELIQUIS) tablet 2.5 mg  2.5 mg Oral BID Coffee, Gerre PebblesGarrett, RPH   2.5 mg at 04/30/17 [id number]  . budesonide (PULMICORT) nebulizer  solution 0.5 mg  0.5 mg Nebulization BID Shaune Pollackhen, Qing, MD   0.5 mg at 04/30/17 0807  . carbidopa-levodopa (SINEMET IR) 25-250 MG per tablet immediate release 1 tablet  1 tablet Oral QID Shaune Pollackhen, Qing, MD   1 tablet at 04/30/17 0840  . chlorhexidine (PERIDEX) 0.12 % solution 15 mL  15 mL Mouth Rinse BID Eugenie NorrieBlakeney, Dana G, NP   15 mL at 04/30/17 0838  . famotidine (PEPCID) tablet 40 mg  40 mg Oral Daily Shaune Pollackhen, Qing, MD   40 mg at 04/30/17 0837  . furosemide (LASIX) tablet 40 mg  40 mg Oral BID Shaune Pollackhen, Qing, MD   40 mg at 04/30/17 [phone]  . gabapentin (NEURONTIN) capsule 100 mg  100 mg Oral TID Shaune Pollackhen, Qing, MD   100 mg at 04/30/17 [id number]  . hydrALAZINE (APRESOLINE) injection 10 mg  10 mg Intravenous Q6H PRN Shaune Pollackhen, Qing, MD      . insulin aspart (novoLOG) injection 0-5 Units  0-5 Units Subcutaneous QHS Gouru, Aruna, MD      . insulin aspart (novoLOG) injection 0-9 Units  0-9 Units Subcutaneous TID WC Ramonita LabGouru, Aruna, MD   2 Units at 04/30/17 0839  . loratadine (CLARITIN) tablet 10 mg  10 mg Oral Daily Shaune Pollackhen, Qing, MD   10 mg at 04/30/17 [phone]  . MEDLINE mouth rinse  15 mL Mouth Rinse q12n4p Eugenie NorrieBlakeney, Dana G, NP   15 mL at 04/29/17 1520  . ondansetron (ZOFRAN) injection 4 mg  4 mg Intravenous Q6H PRN Gouru, Aruna, MD      . potassium chloride (K-DUR,KLOR-CON) CR tablet 10 mEq  10 mEq Oral Daily Shaune Pollackhen, Qing, MD   10 mEq at 04/30/17 [id number]  . sodium chloride flush (NS) 0.9 % injection 3 mL  3 mL Intravenous Q12H Gouru, Aruna, MD   3 mL at 04/30/17 0840  . sodium chloride flush (NS) 0.9 % injection 3 mL  3 mL Intravenous PRN Gouru, Aruna, MD      . tamsulosin (FLOMAX) capsule 0.4 mg  0.4 mg Oral Daily Shaune Pollackhen, Qing, MD   0.4 mg at 04/30/17 [id number]     Discharge Medications: Please see discharge summary for a list of discharge medications.  Relevant Imaging Results:  Relevant Lab Results:   Additional Information SS# [id number]  [place], Thedore Pickel R, LCSWA

## 2017-04-30 NOTE — Progress Notes (Signed)
Dannielle Burnobert S Smalling to be D/C'd Skilled [facility] per MD order. [facility] [place] [facility].  Discussed prescriptions and follow up appointments with the patient and wife. Prescriptions given to patient, medication list explained in detail. Pt verbalized understanding.  Allergies as of 04/30/2017   No Known Allergies     Medication List    STOP taking these medications   HYDROcodone-acetaminophen 5-325 MG tablet Commonly known as:  NORCO/VICODIN   insulin glargine 100 UNIT/ML injection Commonly known as:  LANTUS   insulin regular 100 units/mL injection Commonly known as:  NOVOLIN R,HUMULIN R   metroNIDAZOLE 0.75 % gel Commonly known as:  METROGEL     TAKE these medications   albuterol 108 (90 Base) MCG/ACT inhaler Commonly known as:  PROVENTIL HFA;VENTOLIN HFA Inhale 2 puffs every 4 (four) hours as needed into the lungs for wheezing or shortness of breath.   albuterol (2.5 MG/3ML) 0.083% nebulizer solution Commonly known as:  PROVENTIL Take 3 mLs (2.5 mg total) by nebulization every 6 (six) hours as needed for wheezing or shortness of breath.   amiodarone 200 MG tablet Commonly known as:  PACERONE Take 1 tablet (200 mg total) 2 (two) times daily by mouth.   budesonide 0.5 MG/2ML nebulizer solution Commonly known as:  PULMICORT Take 2 mLs (0.5 mg total) 2 (two) times daily by nebulization.   calcium carbonate 500 MG chewable tablet Commonly known as:  TUMS - dosed in mg elemental calcium Chew 1 tablet (200 mg of elemental calcium total) 2 (two) times daily by mouth.   carbidopa-levodopa 25-250 MG tablet Commonly known as:  SINEMET IR Take 1 tablet by mouth 4 (four) times daily.   cetirizine 10 MG tablet Commonly known as:  ZYRTEC Take 10 mg by mouth daily.   ELIQUIS 2.5 MG Tabs tablet Generic drug:  apixaban Take 2.5 mg 2 (two) times daily by mouth.   famotidine 40 MG tablet Commonly known as:  PEPCID Take 1 tablet (40 mg total) daily  by mouth.   feeding supplement (ENSURE ENLIVE) Liqd Take 237 mLs 3 (three) times daily between meals by mouth.   fluticasone-salmeterol 115-21 MCG/ACT inhaler Commonly known as:  ADVAIR HFA Inhale 2 puffs into the lungs 2 (two) times daily.   furosemide 40 MG tablet Commonly known as:  LASIX Take 1 tablet (40 mg total) by mouth 2 (two) times daily. Hold if SBP<110 or DBP<65 What changed:    when to take this  additional instructions   gabapentin 100 MG capsule Commonly known as:  NEURONTIN Take 100 mg 3 (three) times daily by mouth.   glipiZIDE 10 MG tablet Commonly known as:  GLUCOTROL Take 10 mg by mouth daily before breakfast.   insulin aspart 100 UNIT/ML injection Commonly known as:  novoLOG Inject 0-5 Units into the skin at bedtime.   multivitamin-lutein Caps capsule Take 1 capsule daily by mouth.   potassium chloride 10 MEQ CR capsule Commonly known as:  MICRO-K Take 10 mEq by mouth daily.   tamsulosin 0.4 MG Caps capsule Commonly known as:  FLOMAX Take 1 capsule (0.4 mg total) daily by mouth.       Vitals:   04/30/17 0835 04/30/17 1435  BP: 115/60 125/68  Pulse: 72 90  Resp: 18   Temp:  98.4 F (36.9 C)  SpO2: 96% 93%    Tele box removed and returned.Skin clean, dry and intact without evidence of skin break down, no evidence of skin tears noted. IV catheter discontinued  intact. Site without signs and symptoms of complications. Dressing and pressure applied. Pt denies pain at this time. No complaints noted.  An After Visit Summary was printed and sent with patient. Patient escorted via EMS. Rigoberto NoelErica Y Kais Monje

## 2017-04-30 NOTE — Discharge Instructions (Signed)
Heart Failure Clinic appointment on May 07 2017 at 2:00pm with Clarisa Kindredina Hackney, FNP. Please call [phone] to reschedule. Follow up cardiologist in 1 week.  Heart healthy, low sodium and ADA diet. Daily weight. Home O2 [place] 2-3 L  Information on my medicine - ELIQUIS (apixaban)  This medication education was reviewed with me or my healthcare representative as part of my discharge preparation.  The pharmacist that spoke with me during my hospital stay was:  Olene FlossMelissa D Jarl Sellitto, [facility]  Why was Eliquis prescribed for you? Eliquis was prescribed for you to reduce the risk of a blood clot forming that can cause a stroke if you have a medical condition called atrial fibrillation (a type of irregular heartbeat).  What do You need to know about Eliquis ? Take your Eliquis TWICE DAILY - one tablet in the morning and one tablet in the evening with or without food. If you have difficulty swallowing the tablet whole please discuss with your pharmacist how to take the medication safely.  Take Eliquis exactly as prescribed by your doctor and DO NOT stop taking Eliquis without talking to the doctor who prescribed the medication.  Stopping may increase your risk of developing a stroke.  Refill your prescription before you run out.  After discharge, you should have regular check-up appointments with your healthcare provider that is prescribing your Eliquis.  In the future your dose may need to be changed if your kidney function or weight changes by a significant amount or as you get older.  What do you do if you miss a dose? If you miss a dose, take it as soon as you remember on the same day and resume taking twice daily.  Do not take more than one dose of ELIQUIS at the same time to make up a missed dose.  Important Safety Information A possible side effect of Eliquis is bleeding. You should call your healthcare provider right away if you experience any of the following: ? Bleeding from an injury or  your nose that does not stop. ? Unusual colored urine (red or dark brown) or unusual colored stools (red or black). ? Unusual bruising for unknown reasons. ? A serious fall or if you hit your head (even if there is no bleeding).  Some medicines may interact with Eliquis and might increase your risk of bleeding or clotting while on Eliquis. To help avoid this, consult your healthcare provider or pharmacist prior to using any new prescription or non-prescription medications, including herbals, vitamins, non-steroidal anti-inflammatory drugs (NSAIDs) and supplements.  This website has more information on Eliquis (apixaban): http://[email or web address].com/eliquis/home

## 2017-04-30 NOTE — Progress Notes (Signed)
Unable to obtain daily weight due to broken equipment. Will notify [employer] to fix equipment.

## 2017-04-30 NOTE — Discharge Summary (Signed)
[facility] [place] [facility]   PATIENT NAME: Troy [name]    MR#:  [id number]  DATE OF BIRTH:  03-Apr-1924  DATE OF ADMISSION:  04/25/2017   ADMITTING PHYSICIAN: Troy LabAruna Gouru, MD  DATE OF DISCHARGE: 04/30/2017  PRIMARY CARE PHYSICIAN: Troy ReichmannHande, Vishwanath, MD   ADMISSION DIAGNOSIS:  Hypoxia [R09.02] Acute systolic congestive heart failure (HCC) [I50.21] Acute respiratory failure with hypoxia (HCC) [J96.01] DISCHARGE DIAGNOSIS:  Active Problems:   Acute CHF (congestive heart failure) (HCC)  SECONDARY DIAGNOSIS:   Past Medical History:  Diagnosis Date  . Chronic atrial fibrillation (HCC)    a. on Couamdin; b. CHADS2VASc at least 7 (CHF, HTN, age x 2, DM, stroke x 2); c. s/p MAZE 1999  . Chronic systolic CHF (congestive heart failure) (HCC)    a. echo 08/01/2015: EF of 45%, mild LVH, mitral valve with annular calcification, mitral valve partially mobile  . CKD (chronic kidney disease), stage III (HCC)   . COPD (chronic obstructive pulmonary disease) (HCC)   . Diabetes mellitus without complication (HCC)   . Hypertension   . Hypothyroidism   . Legally blind   . Mitral valve prolapse    a. s/p mitral valve repair 1999  . Normal coronary arteries    a. by cardiac cath 1999  . Parkinson's disease (HCC)   . Stroke (HCC)   . TIA (transient ischemic attack)   . UTI (lower urinary tract infection)    HOSPITAL COURSE:  Troy [name] a83 y.o.malewith a known history of chronic diastolic congestive heart failure, chronic atrial fibrillation on amiodarone andELIQUISis brought into the ED from the nursing home for being hypoxemic and short of breath. Patient's pulse ox was 70% on 2 L of oxygen. EMS bumped up his oxygen and other brought him into the ED. Chest x-ray respiratory congestion. She was given IV Lasix and hospitalist team is called to admit the patient  #Acute on chronic hypoxic respiratory failure second to CHF exacerbation Off BiPAP, on  O2 [place], weaned down to baseline 2 L, DuoNeb as needed.  Resumed Pulmicort.  #Acute on chronic diastolic congestive heart failure, LV EF: 60% - 65% (03/10/2017 echo) Changed to po Lasix bid. Holding ACE inhibitor in view of acute renal insufficiency Resumed beta blocker.  #Acute kidney injury on chronic kidney disease Baseline creatinine seems to be at 1.27 F/u BMP while on lasix. Cr. Is stable.  #Diabetes mellitus Sliding scale insulin.  #Chronic atrial fibrillation Rate controlled. Discontinued heparin drip and resumed Eliquis.  Resumed amiodarone.  Left heel ulcer, stage 2, wound care.  HTN. Continue lasix.  Generalized weakness.  PT DISCHARGE CONDITIONS:  Stable, discharge to SNF today. CONSULTS OBTAINED:   DRUG ALLERGIES:  No Known Allergies DISCHARGE MEDICATIONS:   Allergies as of 04/30/2017   No Known Allergies     Medication List    STOP taking these medications   HYDROcodone-acetaminophen 5-325 MG tablet Commonly known as:  NORCO/VICODIN   insulin glargine 100 UNIT/ML injection Commonly known as:  LANTUS   insulin regular 100 units/mL injection Commonly known as:  NOVOLIN R,HUMULIN R   metroNIDAZOLE 0.75 % gel Commonly known as:  METROGEL     TAKE these medications   albuterol 108 (90 Base) MCG/ACT inhaler Commonly known as:  PROVENTIL HFA;VENTOLIN HFA Inhale 2 puffs every 4 (four) hours as needed into the lungs for wheezing or shortness of breath.   albuterol (2.5 MG/3ML) 0.083% nebulizer solution Commonly known as:  PROVENTIL Take 3 mLs (2.5 mg total)  by nebulization every 6 (six) hours as needed for wheezing or shortness of breath.   amiodarone 200 MG tablet Commonly known as:  PACERONE Take 1 tablet (200 mg total) 2 (two) times daily by mouth.   budesonide 0.5 MG/2ML nebulizer solution Commonly known as:  PULMICORT Take 2 mLs (0.5 mg total) 2 (two) times daily by nebulization.   calcium carbonate 500 MG chewable tablet Commonly  known as:  TUMS - dosed in mg elemental calcium Chew 1 tablet (200 mg of elemental calcium total) 2 (two) times daily by mouth.   carbidopa-levodopa 25-250 MG tablet Commonly known as:  SINEMET IR Take 1 tablet by mouth 4 (four) times daily.   cetirizine 10 MG tablet Commonly known as:  ZYRTEC Take 10 mg by mouth daily.   ELIQUIS 2.5 MG Tabs tablet Generic drug:  apixaban Take 2.5 mg 2 (two) times daily by mouth.   famotidine 40 MG tablet Commonly known as:  PEPCID Take 1 tablet (40 mg total) daily by mouth.   feeding supplement (ENSURE ENLIVE) Liqd Take 237 mLs 3 (three) times daily between meals by mouth.   fluticasone-salmeterol 115-21 MCG/ACT inhaler Commonly known as:  ADVAIR HFA Inhale 2 puffs into the lungs 2 (two) times daily.   furosemide 40 MG tablet Commonly known as:  LASIX Take 1 tablet (40 mg total) by mouth 2 (two) times daily. Hold if SBP<110 or DBP<65 What changed:    when to take this  additional instructions   gabapentin 100 MG capsule Commonly known as:  NEURONTIN Take 100 mg 3 (three) times daily by mouth.   glipiZIDE 10 MG tablet Commonly known as:  GLUCOTROL Take 10 mg by mouth daily before breakfast.   insulin aspart 100 UNIT/ML injection Commonly known as:  novoLOG Inject 0-5 Units into the skin at bedtime.   multivitamin-lutein Caps capsule Take 1 capsule daily by mouth.   potassium chloride 10 MEQ CR capsule Commonly known as:  MICRO-K Take 10 mEq by mouth daily.   tamsulosin 0.4 MG Caps capsule Commonly known as:  FLOMAX Take 1 capsule (0.4 mg total) daily by mouth.        DISCHARGE INSTRUCTIONS:  See AVS.   If you experience worsening of your admission symptoms, develop shortness of breath, life threatening emergency, suicidal or homicidal thoughts you must seek medical attention immediately by calling 911 or calling your MD immediately  if symptoms less severe.  You Must read complete instructions/literature along with  all the possible adverse reactions/side effects for all the Medicines you take and that have been prescribed to you. Take any new Medicines after you have completely understood and accpet all the possible adverse reactions/side effects.   Please note  You were cared for by a hospitalist during your hospital stay. If you have any questions about your discharge medications or the care you received while you were in the hospital after you are discharged, you can call the unit and asked to speak with the hospitalist on call if the hospitalist that took care of you is not available. Once you are discharged, your primary care physician will handle any further medical issues. Please note that NO REFILLS for any discharge medications will be authorized once you are discharged, as it is imperative that you return to your primary care physician (or establish a relationship with a primary care physician if you do not have one) for your aftercare needs so that they can reassess your need for medications and monitor your lab  values.    On the day of Discharge:  VITAL SIGNS:  Blood pressure 125/72, pulse 82, temperature 98 F (36.7 C), temperature source Oral, resp. rate 18, height 6' (1.829 m), weight 235 lb 12.8 oz (107 kg), SpO2 91 %. PHYSICAL EXAMINATION:  GENERAL:  83 y.o.-year-old patient lying in the bed with no acute distress. Obese.  EYES: Pupils equal, round, reactive to light and accommodation. No scleral icterus. Extraocular muscles intact.  HEENT: Head atraumatic, normocephalic. Oropharynx and nasopharynx clear.  NECK:  Supple, no jugular venous distention. No thyroid enlargement, no tenderness.  LUNGS: Normal breath sounds bilaterally, tiny wheezing, no rales,rhonchi or crepitation. No use of accessory muscles of respiration.  CARDIOVASCULAR: S1, S2 normal. No murmurs, rubs, or gallops.  ABDOMEN: Soft, non-tender, non-distended. Bowel sounds present. No organomegaly or mass.  EXTREMITIES: No  pedal edema, cyanosis, or clubbing. Left heel ulcer stage 2 in dressing. NEUROLOGIC: Cranial nerves II through XII are intact. Muscle strength 3-4/5 in all extremities. Sensation intact. Gait not checked.  PSYCHIATRIC: The patient is alert and oriented x 3.  SKIN: No obvious rash, lesion, or ulcer.  DATA REVIEW:   CBC Recent Labs  Lab 04/28/17 0513  WBC 7.1  HGB 11.8*  HCT 36.6*  PLT 185    Chemistries  Recent Labs  Lab 04/25/17 1404  04/29/17 0423 04/30/17 0405  NA 139   < > 138 139  K 3.8   < > 4.0 3.9  CL 95*   < > 101 98*  CO2 32   < > 30 33*  GLUCOSE 191*   < > 149* 170*  BUN 28*   < > 18 19  CREATININE 2.08*   < > 1.50* 1.56*  CALCIUM 8.7*   < > 8.8* 8.6*  MG  --    < > 2.2  --   AST 17  --   --   --   ALT <5*  --   --   --   ALKPHOS 76  --   --   --   BILITOT 1.5*  --   --   --    < > = values in this interval not displayed.     Microbiology Results  Results for orders placed or performed during the hospital encounter of 04/25/17  MRSA PCR Screening     Status: None   Collection Time: 04/25/17  8:40 PM  Result Value Ref Range Status   MRSA by PCR NEGATIVE NEGATIVE Final    Comment:        The GeneXpert MRSA Assay (FDA approved for NASAL specimens only), is one component of a comprehensive MRSA colonization surveillance program. It is not intended to diagnose MRSA infection nor to guide or monitor treatment for MRSA infections. Performed at [facility], [address] [place], [place] [address]   CULTURE, BLOOD (ROUTINE X 2) w Reflex to ID Panel     Status: None (Preliminary result)   Collection Time: 04/26/17 12:01 PM  Result Value Ref Range Status   Specimen Description BLOOD LEFT ANTECUBITAL  Final   Special Requests   Final    BOTTLES DRAWN AEROBIC AND ANAEROBIC Blood Culture adequate volume   Culture   Final    NO GROWTH 3 DAYS Performed at [facility] [address] [place], [place] [address]    Report Status  PENDING  Incomplete  CULTURE, BLOOD (ROUTINE X 2) w Reflex to ID Panel     Status: None (Preliminary  result)   Collection Time: 04/26/17 12:12 PM  Result Value Ref Range Status   Specimen Description BLOOD BLOOD LEFT HAND  Final   Special Requests   Final    BOTTLES DRAWN AEROBIC AND ANAEROBIC Blood Culture adequate volume   Culture   Final    NO GROWTH 3 DAYS Performed at [facility], [address] [place], [place] [address]    Report Status PENDING  Incomplete    RADIOLOGY:  No results found.   Management plans discussed with the patient, family and they are in agreement.  CODE STATUS: DNR   TOTAL TIME TAKING CARE OF THIS PATIENT: 35 minutes.    Shaune Pollack M.D on 04/30/2017 at 8:13 AM  Between 7am to 6pm - Pager - [phone]  After 6pm go to [email or web address] - Social research officer, government  [facility] [place] [facility]  Office  [phone]  CC: Primary care physician; Troy Reichmann, MD   Note: This dictation was prepared with Dragon dictation along with smaller phrase technology. Any transcriptional errors that result from this process are unintentional.

## 2017-04-30 NOTE — Progress Notes (Signed)
Report called to [place] [facility]. Awaiting EMS for transport.

## 2017-05-01 LAB — CULTURE, BLOOD (ROUTINE X 2)
CULTURE: NO GROWTH
Culture: NO GROWTH
SPECIAL REQUESTS: ADEQUATE
Special Requests: ADEQUATE

## 2017-05-07 ENCOUNTER — Ambulatory Visit: Payer: Medicare Other | Admitting: Family

## 2017-05-13 NOTE — Progress Notes (Signed)
Patient ID: Troy [name], male    DOB: 04-30-1924, 85 y.o.   MRN: [id number]  HPI  Troy [name] is a 84 y/o male with a history of DM, HTN, CKD, stroke, thyroid disease, parkinson's disease, atrial fibrillation, COPD and chronic heart failure.   Echo report from 03/10/17 reviewed and showed an EF of  60-65% along with mild Troy. Cardiac catheterization done 08/08/15.  Admitted 04/25/17 due to HF exacerbation. Initially needed IV diuretics and then transitioned to oral diuretics. Discharged back to SNF after 5 days. Admitted 03/09/17 due to HF exacerbation and acute kidney disease. Cardiology, nephrology and palliative care consults were obtained. Needed temporary dialysis. Amiodarone drip initially needed but then transitioned to oral amiodarone. Discharged to SNF after 9 days. Was in the ED 01/16/17 due to shingles where he was treated and released.  He presents today for a follow-up visit although hasn't been seen since May 2017. Patient presents with moderate fatigue with minimal exertion. He describes this as chronic in nature having been present for several years with varying levels of severity. He has associated cough and easy bruising along with this. He denies any cough, shortness of breath, wheezing, chest pain, edema, palpitations, dizziness or weight gain. He says that he gets weighed on a weekly basis. Has had numerous falls recently because the SNF can no longer have bed rails on the bed.   Past Medical History:  Diagnosis Date  . Chronic atrial fibrillation (HCC)    a. on Couamdin; b. CHADS2VASc at least 7 (CHF, HTN, age x 2, DM, stroke x 2); c. s/p MAZE 1999  . Chronic systolic CHF (congestive heart failure) (HCC)    a. echo 08/01/2015: EF of 45%, mild LVH, mitral valve with annular calcification, mitral valve partially mobile  . CKD (chronic kidney disease), stage III (HCC)   . COPD (chronic obstructive pulmonary disease) (HCC)   . Diabetes mellitus without complication (HCC)   .  Hypertension   . Hypothyroidism   . Legally blind   . Mitral valve prolapse    a. s/p mitral valve repair 1999  . Normal coronary arteries    a. by cardiac cath 1999  . Parkinson's disease (HCC)   . Stroke (HCC)   . TIA (transient ischemic attack)   . UTI (lower urinary tract infection)    Past Surgical History:  Procedure Laterality Date  . CARDIAC CATHETERIZATION N/A 08/08/2015   Procedure: Temporary Pacemaker;  Surgeon: Peter M Swaziland, MD;  Location: [facility] INVASIVE CV LAB;  Service: Cardiovascular;  Laterality: N/A;  . CARDIAC SURGERY    . CHOLECYSTECTOMY    . EP IMPLANTABLE DEVICE N/A 08/08/2015   Procedure: Pacemaker Implant;  Surgeon: Will Jorja Loa, MD;  Location: [facility] INVASIVE CV LAB;  Service: Cardiovascular;  Laterality: N/A;   Family History  Problem Relation Age of Onset  . Hypertension Mother   . Stroke Mother   . CAD Father   . Lymphoma Sister   . Lung disease Brother    Social History   Tobacco Use  . Smoking status: Former Smoker    Packs/day: 4.00    Years: 20.00    Pack years: 80.00    Types: Cigarettes  . Smokeless tobacco: Never Used  Substance Use Topics  . Alcohol use: No    Alcohol/week: 0.0 oz   No Known Allergies Prior to Admission medications   Medication Sig Start Date End Date Taking? Authorizing Provider  albuterol (PROVENTIL HFA;VENTOLIN HFA) 108 (90 Base) MCG/ACT inhaler  Inhale 2 puffs every 4 (four) hours as needed into the lungs for wheezing or shortness of breath.   Yes [provider]  albuterol (PROVENTIL) (2.5 MG/3ML) 0.083% nebulizer solution Take 3 mLs (2.5 mg total) by nebulization every 6 (six) hours as needed for wheezing or shortness of breath. 05/13/16  Yes Katha Hamming, MD  amiodarone (PACERONE) 200 MG tablet Take 1 tablet (200 mg total) 2 (two) times daily by mouth. 03/18/17  Yes Shaune Pollack, MD  apixaban (ELIQUIS) 2.5 MG TABS tablet Take 2.5 mg 2 (two) times daily by mouth.   Yes [provider]   budesonide (PULMICORT) 0.5 MG/2ML nebulizer solution Take 2 mLs (0.5 mg total) 2 (two) times daily by nebulization. 03/18/17  Yes Shaune Pollack, MD  calcium carbonate (TUMS - DOSED IN MG ELEMENTAL CALCIUM) 500 MG chewable tablet Chew 1 tablet (200 mg of elemental calcium total) 2 (two) times daily by mouth. 03/18/17  Yes Shaune Pollack, MD  carbidopa-levodopa (SINEMET IR) 25-250 MG tablet Take 1 tablet by mouth 4 (four) times daily.    Yes [provider]  cetirizine (ZYRTEC) 10 MG tablet Take 10 mg by mouth daily.   Yes [provider]  famotidine (PEPCID) 40 MG tablet Take 1 tablet (40 mg total) daily by mouth. 03/18/17  Yes Shaune Pollack, MD  fluticasone-salmeterol (ADVAIR [facility]) [phone] MCG/ACT inhaler Inhale 2 puffs into the lungs 2 (two) times daily. 12/11/15  Yes Wieting, Richard, MD  furosemide (LASIX) 40 MG tablet Take 1 tablet (40 mg total) by mouth 2 (two) times daily. Hold if SBP<110 or DBP<65 04/30/17  Yes Shaune Pollack, MD  gabapentin (NEURONTIN) 100 MG capsule Take 100 mg 3 (three) times daily by mouth.   Yes [provider]  glipiZIDE (GLUCOTROL) 10 MG tablet Take 10 mg by mouth daily before breakfast.   Yes [provider]  insulin aspart (NOVOLOG) 100 UNIT/ML injection Inject 0-5 Units into the skin at bedtime. 05/13/16  Yes Katha Hamming, MD  multivitamin-lutein [facility]) CAPS capsule Take 1 capsule daily by mouth. 03/18/17  Yes Shaune Pollack, MD  potassium chloride (MICRO-K) 10 MEQ CR capsule Take 10 mEq by mouth daily.   Yes [provider]  tamsulosin (FLOMAX) 0.4 MG CAPS capsule Take 1 capsule (0.4 mg total) daily by mouth. 03/18/17  Yes Shaune Pollack, MD  feeding supplement, ENSURE ENLIVE, (ENSURE ENLIVE) LIQD Take 237 mLs 3 (three) times daily between meals by mouth. Patient not taking: Reported on 05/14/2017 03/18/17   Shaune Pollack, MD   Review of Systems  Constitutional: Positive for fatigue. Negative for appetite change.  HENT: Positive  for congestion. Negative for postnasal drip and sore throat.   Eyes: Negative.   Respiratory: Positive for cough. Negative for chest tightness, shortness of breath and wheezing.   Cardiovascular: Negative for chest pain, palpitations and leg swelling.  Gastrointestinal: Negative for abdominal distention and abdominal pain.  Endocrine: Negative.   Genitourinary: Negative.   Musculoskeletal: Negative for back pain and neck pain.  Skin: Positive for wound (right lower arm due to skin tear).  Allergic/Immunologic: Negative.   Neurological: Negative for dizziness and light-headedness.  Hematological: Negative for adenopathy. Bruises/bleeds easily.  Psychiatric/Behavioral: Negative for dysphoric mood and sleep disturbance (wearing oxygen at 3L around the clock). The patient is not nervous/anxious.    Vitals:   05/14/17 0847  BP: (!) 114/48  Pulse: 74  Resp: 18  SpO2: 96%  Weight: 237 lb (107.5 kg)  Height: 5\' 9"  (1.753 m)  Wt Readings from Last 3 Encounters:  05/14/17 237 lb (107.5 kg)  04/28/17 235 lb 12.8 oz (107 kg)  04/22/17 253 lb (114.8 kg)   Lab Results  Component Value Date   CREATININE 1.56 (H) 04/30/2017   CREATININE 1.50 (H) 04/29/2017   CREATININE 1.86 (H) 04/28/2017    Physical Exam  Constitutional: He is oriented to person, place, and time. He appears well-developed and well-nourished.  HENT:  Head: Normocephalic and atraumatic.  Neck: Normal range of motion. Neck supple. No JVD present.  Cardiovascular: Normal rate. An irregular rhythm present.  Pulmonary/Chest: Effort normal. He has no wheezes. He has rhonchi in the right upper field.  Abdominal: Soft. He exhibits no distension. There is no tenderness.  Musculoskeletal: He exhibits no edema or tenderness.  Neurological: He is alert and oriented to person, place, and time.  Skin: Skin is warm and dry.  Psychiatric: He has a normal mood and affect. His behavior is normal. Thought content normal.  Nursing note  and vitals reviewed.   Assessment & Plan  1: Chronic heart failure with preserved ejection fraction- - NYHA class III - euvolemic today - order written for patient to be weighed daily and to call for an overnight weight gain of >2 pounds or a weekly weight gain of >5 pounds - not adding salt to his food but does eat salty foods like bacon for breakfast - receiving PT at the facility five days/week - patient reports receiving his flu vaccine for the season - BNP from 04/25/17 was 146.0  2: HTN-  - BP looks good today - saw PCP (Hande) 01/22/17 & is now seeing PCP at the facility - BMP from 04/30/17 reviewed and showed sodium 139, potassium 3.9 and GFR 37  3: Atrial fibrillation-  - pacemaker in place - currently rate controlled on amiodarone and also is on apixaban  4: Diabetes-  - glucose being checked nightly only at the facility and has been running 148-429 - nonfasting glucose in the clinic today was 292; patient had 2 glasses of OJ, coffee, toast and 4 slices of bacon for breakfast - order written for a fasting glucose every morning - A1c from 01/22/17 was 10.2%  Patient currently has a skin tear wound on his right lower arm that is wrapped. Wife says that he's had multiple falls out of bed as the SNF can no longer have bed rails on the beds.   Facility medication list was reviewed.  Return in 3 months or sooner for any questions/problems before then.

## 2017-05-14 ENCOUNTER — Encounter: Payer: Self-pay | Admitting: Family

## 2017-05-14 ENCOUNTER — Other Ambulatory Visit: Payer: Self-pay | Admitting: Family

## 2017-05-14 ENCOUNTER — Ambulatory Visit: Payer: Medicare Other | Attending: Family | Admitting: Family

## 2017-05-14 VITALS — BP 114/48 | HR 74 | Resp 18 | Ht 69.0 in | Wt 237.0 lb

## 2017-05-14 DIAGNOSIS — Z823 Family history of stroke: Secondary | ICD-10-CM | POA: Diagnosis not present

## 2017-05-14 DIAGNOSIS — I341 Nonrheumatic mitral (valve) prolapse: Secondary | ICD-10-CM | POA: Insufficient documentation

## 2017-05-14 DIAGNOSIS — Z8619 Personal history of other infectious and parasitic diseases: Secondary | ICD-10-CM | POA: Insufficient documentation

## 2017-05-14 DIAGNOSIS — Z9049 Acquired absence of other specified parts of digestive tract: Secondary | ICD-10-CM | POA: Insufficient documentation

## 2017-05-14 DIAGNOSIS — Z794 Long term (current) use of insulin: Secondary | ICD-10-CM | POA: Insufficient documentation

## 2017-05-14 DIAGNOSIS — Z8249 Family history of ischemic heart disease and other diseases of the circulatory system: Secondary | ICD-10-CM | POA: Insufficient documentation

## 2017-05-14 DIAGNOSIS — E1122 Type 2 diabetes mellitus with diabetic chronic kidney disease: Secondary | ICD-10-CM | POA: Diagnosis not present

## 2017-05-14 DIAGNOSIS — N183 Chronic kidney disease, stage 3 unspecified: Secondary | ICD-10-CM

## 2017-05-14 DIAGNOSIS — Z807 Family history of other malignant neoplasms of lymphoid, hematopoietic and related tissues: Secondary | ICD-10-CM | POA: Diagnosis not present

## 2017-05-14 DIAGNOSIS — I482 Chronic atrial fibrillation, unspecified: Secondary | ICD-10-CM

## 2017-05-14 DIAGNOSIS — I130 Hypertensive heart and chronic kidney disease with heart failure and stage 1 through stage 4 chronic kidney disease, or unspecified chronic kidney disease: Secondary | ICD-10-CM | POA: Diagnosis not present

## 2017-05-14 DIAGNOSIS — Z9889 Other specified postprocedural states: Secondary | ICD-10-CM | POA: Insufficient documentation

## 2017-05-14 DIAGNOSIS — Z8673 Personal history of transient ischemic attack (TIA), and cerebral infarction without residual deficits: Secondary | ICD-10-CM | POA: Insufficient documentation

## 2017-05-14 DIAGNOSIS — Z87891 Personal history of nicotine dependence: Secondary | ICD-10-CM | POA: Diagnosis not present

## 2017-05-14 DIAGNOSIS — Z950 Presence of cardiac pacemaker: Secondary | ICD-10-CM | POA: Diagnosis not present

## 2017-05-14 DIAGNOSIS — R296 Repeated falls: Secondary | ICD-10-CM | POA: Insufficient documentation

## 2017-05-14 DIAGNOSIS — Z836 Family history of other diseases of the respiratory system: Secondary | ICD-10-CM | POA: Insufficient documentation

## 2017-05-14 DIAGNOSIS — E039 Hypothyroidism, unspecified: Secondary | ICD-10-CM | POA: Insufficient documentation

## 2017-05-14 DIAGNOSIS — I5032 Chronic diastolic (congestive) heart failure: Secondary | ICD-10-CM | POA: Insufficient documentation

## 2017-05-14 DIAGNOSIS — Z79899 Other long term (current) drug therapy: Secondary | ICD-10-CM | POA: Diagnosis not present

## 2017-05-14 DIAGNOSIS — G20 Parkinson's disease: Secondary | ICD-10-CM | POA: Insufficient documentation

## 2017-05-14 DIAGNOSIS — Z992 Dependence on renal dialysis: Secondary | ICD-10-CM | POA: Diagnosis not present

## 2017-05-14 DIAGNOSIS — Z7902 Long term (current) use of antithrombotics/antiplatelets: Secondary | ICD-10-CM | POA: Insufficient documentation

## 2017-05-14 DIAGNOSIS — I10 Essential (primary) hypertension: Secondary | ICD-10-CM

## 2017-05-14 DIAGNOSIS — J449 Chronic obstructive pulmonary disease, unspecified: Secondary | ICD-10-CM | POA: Diagnosis not present

## 2017-05-14 DIAGNOSIS — I5042 Chronic combined systolic (congestive) and diastolic (congestive) heart failure: Secondary | ICD-10-CM | POA: Diagnosis present

## 2017-05-14 LAB — GLUCOSE, CAPILLARY: Glucose-Capillary: 292 mg/dL — ABNORMAL HIGH (ref 65–99)

## 2017-05-14 NOTE — Patient Instructions (Signed)
Begin weighing daily and call for an overnight weight gain of > 2 pounds or a weekly weight gain of >5 pounds. 

## 2017-06-05 ENCOUNTER — Ambulatory Visit: Payer: Medicare Other | Admitting: Physician Assistant

## 2017-06-12 ENCOUNTER — Encounter: Attending: Physician Assistant | Admitting: Physician Assistant

## 2017-06-12 DIAGNOSIS — G20 Parkinson's disease: Secondary | ICD-10-CM | POA: Diagnosis not present

## 2017-06-12 DIAGNOSIS — Z823 Family history of stroke: Secondary | ICD-10-CM | POA: Insufficient documentation

## 2017-06-12 DIAGNOSIS — R1310 Dysphagia, unspecified: Secondary | ICD-10-CM | POA: Insufficient documentation

## 2017-06-12 DIAGNOSIS — I110 Hypertensive heart disease with heart failure: Secondary | ICD-10-CM | POA: Diagnosis not present

## 2017-06-12 DIAGNOSIS — I509 Heart failure, unspecified: Secondary | ICD-10-CM | POA: Insufficient documentation

## 2017-06-12 DIAGNOSIS — M1990 Unspecified osteoarthritis, unspecified site: Secondary | ICD-10-CM | POA: Insufficient documentation

## 2017-06-12 DIAGNOSIS — Z809 Family history of malignant neoplasm, unspecified: Secondary | ICD-10-CM | POA: Insufficient documentation

## 2017-06-12 DIAGNOSIS — E039 Hypothyroidism, unspecified: Secondary | ICD-10-CM | POA: Diagnosis not present

## 2017-06-12 DIAGNOSIS — E11621 Type 2 diabetes mellitus with foot ulcer: Secondary | ICD-10-CM | POA: Diagnosis not present

## 2017-06-12 DIAGNOSIS — J449 Chronic obstructive pulmonary disease, unspecified: Secondary | ICD-10-CM | POA: Insufficient documentation

## 2017-06-12 DIAGNOSIS — E1151 Type 2 diabetes mellitus with diabetic peripheral angiopathy without gangrene: Secondary | ICD-10-CM | POA: Diagnosis not present

## 2017-06-12 DIAGNOSIS — I482 Chronic atrial fibrillation: Secondary | ICD-10-CM | POA: Insufficient documentation

## 2017-06-12 DIAGNOSIS — F0390 Unspecified dementia without behavioral disturbance: Secondary | ICD-10-CM | POA: Insufficient documentation

## 2017-06-12 DIAGNOSIS — Z87891 Personal history of nicotine dependence: Secondary | ICD-10-CM | POA: Insufficient documentation

## 2017-06-12 DIAGNOSIS — L89623 Pressure ulcer of left heel, stage 3: Secondary | ICD-10-CM | POA: Diagnosis not present

## 2017-06-14 NOTE — Progress Notes (Addendum)
Troy [name], Troy [name] ([id number]) Visit Report for 06/12/2017 Allergy List Details Patient Name: Troy [name], Troy [name]. Date of Service: 06/12/2017 10:30 AM Medical Record Number: [id number] Patient Account Number: [id number] Date of Birth/Sex: 1923-05-28 (84 y.o. Male) Treating RN: Phillis Haggis Primary Care Barry Faircloth: Barbette Reichmann Other Clinician: Referring Takeisha Cianci: Barbette Reichmann Treating Wanona Stare/Extender: STONE III, HOYT Weeks in Treatment: 0 Allergies Active Allergies NKDA Allergy Notes Electronic Signature(s) Signed: 06/12/2017 4:52:03 PM By: Alejandro Mulling Entered By: Alejandro Mulling on 06/12/2017 11:09:59 Troy [name] ([id number]) -------------------------------------------------------------------------------- Arrival Information Details Patient Name: Troy [name] Date of Service: 06/12/2017 10:30 AM Medical Record Number: [id number] Patient Account Number: [id number] Date of Birth/Sex: 08/24/1923 (84 y.o. Male) Treating RN: Phillis Haggis Primary Care Teondra Newburg: Barbette Reichmann Other Clinician: Referring Erin Obando: Barbette Reichmann Treating Gabriellah Rabel/Extender: Linwood Dibbles, HOYT Weeks in Treatment: 0 Visit Information Patient Arrived: Wheel Chair Arrival Time: 11:01 Accompanied By: wife Transfer Assistance: EasyPivot Patient Lift Patient Identification Verified: Yes Secondary Verification Process Yes Completed: Patient Requires Transmission-Based No Precautions: Patient Has Alerts: Yes Patient Alerts: DM II Electronic Signature(s) Signed: 06/12/2017 4:52:03 PM By: Alejandro Mulling Entered By: Alejandro Mulling on 06/12/2017 11:06:29 Troy [name] ([id number]) -------------------------------------------------------------------------------- Clinic Level of Care Assessment Details Patient Name: Troy [name] Date of Service: 06/12/2017 10:30 AM Medical Record Number: [id number] Patient Account Number: [id number] Date of Birth/Sex: [birth year] (84 y.o.  Male) Treating RN: Phillis Haggis Primary Care Guida Asman: Barbette Reichmann Other Clinician: Referring Zackeriah Kissler: Barbette Reichmann Treating Gillermo Poch/Extender: STONE III, HOYT Weeks in Treatment: 0 Clinic Level of Care Assessment Items TOOL 1 Quantity Score X - Use when EandM and Procedure is performed on INITIAL visit 1 0 ASSESSMENTS - Nursing Assessment / Reassessment X - General Physical Exam (combine w/ comprehensive assessment (listed just below) when 1 20 performed on new pt. evals) X- 1 25 Comprehensive Assessment (HX, ROS, Risk Assessments, Wounds Hx, etc.) ASSESSMENTS - Wound and Skin Assessment / Reassessment []  - Dermatologic / Skin Assessment (not related to wound area) 0 ASSESSMENTS - Ostomy and/or Continence Assessment and Care []  - Incontinence Assessment and Management 0 []  - 0 Ostomy Care Assessment and Management (repouching, etc.) PROCESS - Coordination of Care []  - Simple Patient / Family Education for ongoing care 0 X- 1 20 Complex (extensive) Patient / Family Education for ongoing care X- 1 10 Staff obtains Chiropractor, Records, Test Results / Process Orders X- 1 10 Staff telephones HHA, Nursing Homes / Clarify orders / etc []  - 0 Routine Transfer to another Facility (non-emergent condition) []  - 0 Routine Hospital Admission (non-emergent condition) X- 1 15 New Admissions / Manufacturing engineer / Ordering NPWT, Apligraf, etc. []  - 0 Emergency Hospital Admission (emergent condition) PROCESS - Special Needs []  - Pediatric / Minor Patient Management 0 []  - 0 Isolation Patient Management []  - 0 Hearing / Language / Visual special needs []  - 0 Assessment of Community assistance (transportation, D/C planning, etc.) []  - 0 Additional assistance / Altered mentation []  - 0 Support Surface(s) Assessment (bed, cushion, seat, etc.) Troy [name], BOHNET. ([id number]) INTERVENTIONS - Miscellaneous []  - External ear exam 0 []  - 0 Patient Transfer (multiple  staff / Nurse, adult / Similar devices) []  - 0 Simple Staple / Suture removal (25 or less) []  - 0 Complex Staple / Suture removal (26 or more) []  - 0 Hypo/Hyperglycemic Management (do not check if billed separately) X- 1 15 Ankle / Brachial Index (ABI) - do not check if billed separately Has the patient been seen at the hospital within the last  three years: Yes Total Score: 115 Level Of Care: New/Established - Level 3 Electronic Signature(s) Signed: 06/12/2017 4:52:03 PM By: Alejandro Mulling Entered By: Alejandro Mulling on 06/12/2017 13:39:04 Troy [name] ([id number]) -------------------------------------------------------------------------------- Encounter Discharge Information Details Patient Name: Troy [name], BOCOCK. Date of Service: 06/12/2017 10:30 AM Medical Record Number: [id number] Patient Account Number: [id number] Date of Birth/Sex: Aug 07, 1923 (84 y.o. Male) Treating RN: Phillis Haggis Primary Care Turkessa Ostrom: Barbette Reichmann Other Clinician: Referring Rohini Jaroszewski: Barbette Reichmann Treating Crysten Kaman/Extender: Linwood Dibbles, HOYT Weeks in Treatment: 0 Encounter Discharge Information Items Discharge Pain Level: 0 Discharge Condition: Stable Ambulatory Status: Wheelchair Discharge Destination: Home Private Transportation: Auto Accompanied By: wife Schedule Follow-up Appointment: Yes Medication Reconciliation completed and provided No to Patient/Care Kolbi Altadonna: Clinical Summary of Care: Provided Form Type Recipient Paper Patient rg Electronic Signature(s) Signed: 06/12/2017 12:28:37 PM By: Dayton Martes RCP, RRT, CHT Entered By: Dayton Martes on 06/12/2017 12:28:37 Troy [name] ([id number]) -------------------------------------------------------------------------------- Lower Extremity Assessment Details Patient Name: Troy [name]. Date of Service: 06/12/2017 10:30 AM Medical Record Number: [id number] Patient Account Number:  [id number] Date of Birth/Sex: 04-30-1924 (84 y.o. Male) Treating RN: Ashok Cordia, Debi Primary Care Lillyan Hitson: Barbette Reichmann Other Clinician: Referring Brice Kossman: Barbette Reichmann Treating Samanthamarie Ezzell/Extender: Linwood Dibbles, HOYT Weeks in Treatment: 0 Edema Assessment Assessed: [Left: No] [Right: No] Edema: [Left: Ye] [Right: s] Calf Left: Right: Point of Measurement: 33 cm From Medial Instep 37 cm cm Ankle Left: Right: Point of Measurement: 13 cm From Medial Instep 23.5 cm cm Vascular Assessment Pulses: Posterior Tibial Extremity colors, hair growth, and conditions: Extremity Color: [Left:Normal] Hair Growth on Extremity: [Left:Yes] Temperature of Extremity: [Left:Warm] Capillary Refill: [Left:< 3 seconds] Blood Pressure: Brachial: [Left:130] Dorsalis Pedis: 170 [Left:Dorsalis Pedis:] Ankle: Posterior Tibial: 178 [Left:Posterior Tibial: 1.37] Toe Nail Assessment Left: Right: Thick: Yes Discolored: Yes Deformed: Yes Improper Length and Hygiene: Yes Electronic Signature(s) Signed: 06/12/2017 4:52:03 PM By: Alejandro Mulling Entered By: Alejandro Mulling on 06/12/2017 11:32:20 Troy [name] ([id number]) -------------------------------------------------------------------------------- Multi Wound Chart Details Patient Name: Troy [name]. Date of Service: 06/12/2017 10:30 AM Medical Record Number: [id number] Patient Account Number: [id number] Date of Birth/Sex: [birth year] (84 y.o. Male) Treating RN: Phillis Haggis Primary Care Emalene Welte: Barbette Reichmann Other Clinician: Referring Keeley Sussman: Barbette Reichmann Treating Bryony Kaman/Extender: STONE III, HOYT Weeks in Treatment: 0 Vital Signs Height(in): 69 Pulse(bpm): 67 Weight(lbs): 226 Blood Pressure(mmHg): 112/59 Body Mass Index(BMI): 33 Temperature(F): 98.0 Respiratory Rate 18 (breaths/min): Photos: [1:No Photos] [N/A:N/A] Wound Location: [1:Left Calcaneus] [N/A:N/A] Wounding Event: [1:Pressure Injury]  [N/A:N/A] Primary Etiology: [1:Pressure Ulcer] [N/A:N/A] Comorbid History: [1:Asthma, Arrhythmia, Congestive Heart Failure, Hypertension, Type II Diabetes, Osteoarthritis] [N/A:N/A] Date Acquired: [1:03/12/2017] [N/A:N/A] Weeks of Treatment: [1:0] [N/A:N/A] Wound Status: [1:Open] [N/A:N/A] Measurements L x W x D [1:0.2x0.2x0.1] [N/A:N/A] (cm) Area (cm) : [1:0.031] [N/A:N/A] Volume (cm) : [1:0.003] [N/A:N/A] % Reduction in Area: [1:97.40%] [N/A:N/A] % Reduction in Volume: [1:97.50%] [N/A:N/A] Classification: [1:Category/Stage III] [N/A:N/A] Exudate Amount: [1:Large] [N/A:N/A] Exudate Type: [1:Serosanguineous] [N/A:N/A] Exudate Color: [1:red, brown] [N/A:N/A] Wound Margin: [1:Distinct, outline attached] [N/A:N/A] Exposed Structures: [1:Fascia: No Fat Layer (Subcutaneous Tissue) Exposed: No Tendon: No Muscle: No Joint: No Bone: No] [N/A:N/A] Epithelialization: [1:None] [N/A:N/A] Periwound Skin Texture: [1:No Abnormalities Noted] [N/A:N/A] Periwound Skin Moisture: [1:No Abnormalities Noted] [N/A:N/A] Periwound Skin Color: [1:No Abnormalities Noted] [N/A:N/A] Temperature: [1:No Abnormality] [N/A:N/A] Tenderness on Palpation: [1:Yes] [N/A:N/A] Wound Preparation: [1:Ulcer Cleansing: Rinsed/Irrigated with Saline] [N/A:N/A] Topical Anesthetic Applied: Other: lidocaine 4% Treatment Notes Electronic Signature(s) Signed: 06/12/2017 4:52:03 PM By: Alejandro Mulling Entered By: Alejandro Mulling on 06/12/2017 12:09:56 Troy [name] ([id number]) --------------------------------------------------------------------------------  Multi-Disciplinary Care Plan Details Patient Name: Troy [name], Troy [name]. Date of Service: 06/12/2017 10:30 AM Medical Record Number: [id number] Patient Account Number: [id number] Date of Birth/Sex: Oct 08, 1923 (84 y.o. Male) Treating RN: Phillis Haggis Primary Care Dupree Givler: Barbette Reichmann Other Clinician: Referring Wynter Grave: Barbette Reichmann Treating  Mandee Pluta/Extender: Linwood Dibbles, HOYT Weeks in Treatment: 0 Active Inactive ` Abuse / Safety / Falls / Self Care Management Nursing Diagnoses: History of Falls Potential for falls Goals: Patient will not develop complications from immobility Date Initiated: 06/12/2017 Target Resolution Date: 10/10/2017 Goal Status: Active Patient will not experience any injury related to falls Date Initiated: 06/12/2017 Target Resolution Date: 10/10/2017 Goal Status: Active Interventions: Assess Activities of Daily Living upon admission and as needed Assess fall risk on admission and as needed Assess: immobility, friction, shearing, incontinence upon admission and as needed Assess impairment of mobility on admission and as needed per policy Assess personal safety and home safety (as indicated) on admission and as needed Assess self care needs on admission and as needed Notes: ` Nutrition Nursing Diagnoses: Imbalanced nutrition Impaired glucose control: actual or potential Potential for alteratiion in Nutrition/Potential for imbalanced nutrition Goals: Patient/caregiver agrees to and verbalizes understanding of need to use nutritional supplements and/or vitamins as prescribed Date Initiated: 06/12/2017 Target Resolution Date: 09/12/2017 Goal Status: Active Patient/caregiver will maintain therapeutic glucose control Date Initiated: 06/12/2017 Target Resolution Date: 09/12/2017 Goal Status: Active Interventions: Troy [name], Troy [name] ([id number]) Assess patient nutrition upon admission and as needed per policy Notes: ` Orientation to the Wound Care Program Nursing Diagnoses: Knowledge deficit related to the wound healing center program Goals: Patient/caregiver will verbalize understanding of the Wound Healing Center Program Date Initiated: 06/12/2017 Target Resolution Date: 07/11/2017 Goal Status: Active Interventions: Provide education on orientation to the wound center Notes: ` Pressure Nursing  Diagnoses: Knowledge deficit related to causes and risk factors for pressure ulcer development Knowledge deficit related to management of pressures ulcers Potential for impaired tissue integrity related to pressure, friction, moisture, and shear Goals: Patient will remain free from development of additional pressure ulcers Date Initiated: 06/12/2017 Target Resolution Date: 09/12/2017 Goal Status: Active Interventions: Assess: immobility, friction, shearing, incontinence upon admission and as needed Assess offloading mechanisms upon admission and as needed Assess potential for pressure ulcer upon admission and as needed Provide education on pressure ulcers Notes: ` Wound/Skin Impairment Nursing Diagnoses: Impaired tissue integrity Knowledge deficit related to ulceration/compromised skin integrity Goals: Ulcer/skin breakdown will have a volume reduction of 80% by week 12 Date Initiated: 06/12/2017 Target Resolution Date: 10/10/2017 Goal Status: Active Interventions: Troy [name], Troy [name] ([id number]) Assess patient/caregiver ability to perform ulcer/skin care regimen upon admission and as needed Assess ulceration(s) every visit Notes: Electronic Signature(s) Signed: 06/12/2017 4:52:03 PM By: Alejandro Mulling Entered By: Alejandro Mulling on 06/12/2017 12:09:43 Troy [name] ([id number]) -------------------------------------------------------------------------------- Pain Assessment Details Patient Name: Troy [name]. Date of Service: 06/12/2017 10:30 AM Medical Record Number: [id number] Patient Account Number: [id number] Date of Birth/Sex: Jul 05, 1923 (84 y.o. Male) Treating RN: Phillis Haggis Primary Care Jyssica Rief: Barbette Reichmann Other Clinician: Referring Raliyah Montella: Barbette Reichmann Treating Clancey Welton/Extender: Linwood Dibbles, HOYT Weeks in Treatment: 0 Active Problems Location of Pain Severity and Description of Pain Patient Has Paino No Site Locations Pain Management and  Medication Current Pain Management: Electronic Signature(s) Signed: 06/12/2017 4:52:03 PM By: Alejandro Mulling Entered By: Alejandro Mulling on 06/12/2017 11:06:38 Troy [name] ([id number]) -------------------------------------------------------------------------------- Patient/Caregiver Education Details Patient Name: Troy [name] Date of Service: 06/12/2017 10:30 AM Medical Record Number: [id number] Patient Account Number: [id number] Date of  Birth/Gender: 08-Oct-1923 [age] y.o. Male) Treating RN: Ashok CordiaPinkerton, Debi Primary Care Physician: Barbette ReichmannHande, Vishwanath Other Clinician: Referring Physician: Barbette ReichmannHande, Vishwanath Treating Physician/Extender: Linwood DibblesSTONE III, HOYT Weeks in Treatment: 0 Education Assessment Education Provided To: Patient and Caregiver wife Education Topics Provided Pressure: Handouts: Pressure Ulcers: Care and Offloading Methods: Explain/Verbal Responses: State content correctly Welcome To [facility]: Handouts: Welcome To [facility] Methods: Explain/Verbal Responses: State content correctly Wound/Skin Impairment: Handouts: Caring for Your Ulcer, Other: change dressing as ordered Methods: Demonstration, Explain/Verbal Responses: State content correctly Electronic Signature(s) Signed: 06/12/2017 4:52:03 PM By: Alejandro MullingPinkerton, Debra Entered By: Alejandro MullingPinkerton, Debra on 06/12/2017 12:16:13 Troy [name], Troy S. ([id number]) -------------------------------------------------------------------------------- Wound Assessment Details Patient Name: Troy [name], Troy S. Date of Service: 06/12/2017 10:30 AM Medical Record Number: [id number] Patient Account Number: [id number] Date of Birth/Sex: 08-Oct-1923 [age] y.o. Male) Treating RN: Phillis HaggisPinkerton, Debi Primary Care Ellar Hakala: Barbette ReichmannHande, Vishwanath Other Clinician: Referring Deanna Boehlke: Barbette ReichmannHande, Vishwanath Treating Nathanal Hermiz/Extender: STONE III, HOYT Weeks in Treatment: 0 Wound Status Wound Number: 1 Primary Pressure  Ulcer Etiology: Wound Location: Left Calcaneus Wound Open Wounding Event: Pressure Injury Status: Date Acquired: 03/12/2017 Comorbid Asthma, Arrhythmia, Congestive Heart Failure, Weeks Of Treatment: 0 History: Hypertension, Type II Diabetes, Osteoarthritis Clustered Wound: No Photos Photo Uploaded By: Alejandro MullingPinkerton, Debra on 06/12/2017 16:51:06 Wound Measurements Length: (cm) 0.2 Width: (cm) 0.2 Depth: (cm) 0.1 Area: (cm) 0.031 Volume: (cm) 0.003 % Reduction in Area: 97.4% % Reduction in Volume: 97.5% Epithelialization: None Tunneling: No Undermining: No Wound Description Classification: Category/Stage III Wound Margin: Distinct, outline attached Exudate Amount: Large Exudate Type: Serosanguineous Exudate Color: red, brown Foul Odor After Cleansing: No Slough/Fibrino Yes Wound Bed Exposed Structure Fascia Exposed: No Fat Layer (Subcutaneous Tissue) Exposed: No Tendon Exposed: No Muscle Exposed: No Joint Exposed: No Bone Exposed: No Periwound Skin Texture Troy [name], Troy S. ([id number]) Texture Color No Abnormalities Noted: No No Abnormalities Noted: No Moisture Temperature / Pain No Abnormalities Noted: No Temperature: No Abnormality Tenderness on Palpation: Yes Wound Preparation Ulcer Cleansing: Rinsed/Irrigated with Saline Topical Anesthetic Applied: Other: lidocaine 4%, Treatment Notes Wound #1 (Left Calcaneus) 1. Cleansed with: Clean wound with Normal Saline 2. Anesthetic Topical Lidocaine 4% cream to wound bed prior to debridement 4. Dressing Applied: Prisma Ag 5. Secondary Dressing Applied Dry Gauze Kerlix/Conform 7. Secured with Tape Notes heel cup, stretch netting, O2 3 LPM via [place] Electronic Signature(s) Signed: 06/12/2017 4:52:03 PM By: Alejandro MullingPinkerton, Debra Entered By: Alejandro MullingPinkerton, Debra on 06/12/2017 12:07:51 Troy [name], Kellan S. ([id number]) -------------------------------------------------------------------------------- Vitals Details Patient  Name: Troy [name], Oris S. Date of Service: 06/12/2017 10:30 AM Medical Record Number: [id number] Patient Account Number: [id number] Date of Birth/Sex: 08-Oct-1923 [age] y.o. Male) Treating RN: Phillis HaggisPinkerton, Debi Primary Care Deven Audi: Barbette ReichmannHande, Vishwanath Other Clinician: Referring Genia Perin: Barbette ReichmannHande, Vishwanath Treating Jhordyn Hoopingarner/Extender: STONE III, HOYT Weeks in Treatment: 0 Vital Signs Time Taken: 11:06 Temperature (F): 98.0 Height (in): 69 Pulse (bpm): 67 Source: Stated Respiratory Rate (breaths/min): 18 Weight (lbs): 226 Blood Pressure (mmHg): 112/59 Source: Stated Reference Range: 80 - 120 mg / dl Body Mass Index (BMI): 33.4 Electronic Signature(s) Signed: 06/12/2017 4:52:03 PM By: Alejandro MullingPinkerton, Debra Entered By: Alejandro MullingPinkerton, Debra on 06/12/2017 11:08:52

## 2017-06-14 NOTE — Progress Notes (Signed)
QUANELL, LOUGHNEY ([id number]) Visit Report for 06/12/2017 Abuse/Suicide Risk Screen Details Patient Name: Troy [name], Troy [name]. Date of Service: 06/12/2017 10:30 AM Medical Record Number: [id number] Patient Account Number: [id number] Date of Birth/Sex: [birth year] (84 y.o. Male) Treating RN: Phillis Haggis Primary Care Ahan Eisenberger: Barbette Reichmann Other Clinician: Referring Jahaad Penado: Barbette Reichmann Treating Keeton Kassebaum/Extender: STONE III, HOYT Weeks in Treatment: 0 Abuse/Suicide Risk Screen Items Answer ABUSE/SUICIDE RISK SCREEN: Has anyone close to you tried to hurt or harm you recentlyo No Do you feel uncomfortable with anyone in your familyo No Has anyone forced you do things that you didnot want to doo No Do you have any thoughts of harming yourselfo No Patient displays signs or symptoms of abuse and/or neglect. No Electronic Signature(s) Signed: 06/12/2017 4:52:03 PM By: Alejandro Mulling Entered By: Alejandro Mulling on 06/12/2017 11:19:08 Troy [name] ([id number]) -------------------------------------------------------------------------------- Activities of Daily Living Details Patient Name: Troy [name], Troy [name]. Date of Service: 06/12/2017 10:30 AM Medical Record Number: [id number] Patient Account Number: [id number] Date of Birth/Sex: 07-30-1923 (84 y.o. Male) Treating RN: Phillis Haggis Primary Care Alfhild Partch: Barbette Reichmann Other Clinician: Referring Yassmin Binegar: Barbette Reichmann Treating Shelton Soler/Extender: Linwood Dibbles, HOYT Weeks in Treatment: 0 Activities of Daily Living Items Answer Activities of Daily Living (Please select one for each item) Drive Automobile Not Able Take Medications Need Assistance Use Telephone Need Assistance Care for Appearance Need Assistance Use Toilet Need Assistance Bath / Shower Need Assistance Dress Self Need Assistance Feed Self Completely Able Walk Need Assistance Get In / Out Bed Need Assistance Housework Not Able Prepare Meals Not  Able Handle Money Not Able Shop for Self Not Able Electronic Signature(s) Signed: 06/12/2017 4:52:03 PM By: Alejandro Mulling Entered By: Alejandro Mulling on 06/12/2017 11:20:05 Troy [name] ([id number]) -------------------------------------------------------------------------------- Education Assessment Details Patient Name: Troy [name] Date of Service: 06/12/2017 10:30 AM Medical Record Number: [id number] Patient Account Number: [id number] Date of Birth/Sex: Jun 24, 1923 (84 y.o. Male) Treating RN: Phillis Haggis Primary Care Analese Sovine: Barbette Reichmann Other Clinician: Referring Sharine Cadle: Barbette Reichmann Treating Everhett Bozard/Extender: Linwood Dibbles, HOYT Weeks in Treatment: 0 Primary Learner Assessed: Patient Learning Preferences/Education Level/Primary Language Learning Preference: Explanation, Printed Material Highest Education Level: College or Above Preferred Language: English Cognitive Barrier Assessment/Beliefs Language Barrier: No Translator Needed: No Memory Deficit: No Emotional Barrier: No Cultural/Religious Beliefs Affecting Medical Care: No Physical Barrier Assessment Impaired Vision: No Impaired Hearing: No Decreased Hand dexterity: No Knowledge/Comprehension Assessment Knowledge Level: Medium Comprehension Level: Medium Ability to understand written Medium instructions: Ability to understand verbal Medium instructions: Motivation Assessment Anxiety Level: Calm Cooperation: Cooperative Education Importance: Acknowledges Need Interest in Health Problems: Asks Questions Perception: Coherent Willingness to Engage in Self- Medium Management Activities: Readiness to Engage in Self- Medium Management Activities: Electronic Signature(s) Signed: 06/12/2017 4:52:03 PM By: Alejandro Mulling Entered By: Alejandro Mulling on 06/12/2017 11:20:24 Troy [name] ([id number]) -------------------------------------------------------------------------------- Fall  Risk Assessment Details Patient Name: Troy [name] Date of Service: 06/12/2017 10:30 AM Medical Record Number: [id number] Patient Account Number: [id number] Date of Birth/Sex: [birth year] (84 y.o. Male) Treating RN: Phillis Haggis Primary Care Kaito Schulenburg: Barbette Reichmann Other Clinician: Referring Ancelmo Hunt: Barbette Reichmann Treating Audreanna Torrisi/Extender: Linwood Dibbles, HOYT Weeks in Treatment: 0 Fall Risk Assessment Items Have you had 2 or more falls in the last 12 monthso 0 No Have you had any fall that resulted in injury in the last 12 monthso 0 No FALL RISK ASSESSMENT: History of falling - immediate or within 3 months 25 Yes Secondary diagnosis 15 Yes Ambulatory aid None/bed rest/wheelchair/nurse 0 Yes Crutches/cane/walker 15 Yes  Furniture 0 No IV Access/Saline Lock 0 No Gait/Training Normal/bed rest/immobile 0 No Weak 10 Yes Impaired 20 Yes Mental Status Oriented to own ability 0 Yes Electronic Signature(s) Signed: 06/12/2017 4:52:03 PM By: Alejandro MullingPinkerton, Debra Entered By: Alejandro MullingPinkerton, Debra on 06/12/2017 11:21:00 Troy BurnGILMORE, Kaelyn S. ([id number]) -------------------------------------------------------------------------------- Foot Assessment Details Patient Name: Troy BurnGILMORE, Troy S. Date of Service: 06/12/2017 10:30 AM Medical Record Number: [id number] Patient Account Number: [id number] Date of Birth/Sex: 05-18-1923 [age] y.o. Male) Treating RN: Phillis HaggisPinkerton, Debi Primary Care Turner Baillie: Barbette ReichmannHande, Vishwanath Other Clinician: Referring Janeliz Prestwood: Barbette ReichmannHande, Vishwanath Treating Finn Altemose/Extender: Linwood DibblesSTONE III, HOYT Weeks in Treatment: 0 Foot Assessment Items Site Locations + = Sensation present, - = Sensation absent, C = Callus, U = Ulcer R = Redness, W = Warmth, M = Maceration, PU = Pre-ulcerative lesion F = Fissure, S = Swelling, D = Dryness Assessment Right: Left: Other Deformity: No No Prior Foot Ulcer: No No Prior Amputation: No No Charcot Joint: No No Ambulatory Status: Ambulatory With  Help Assistance Device: Walker Gait: Surveyor, miningUnsteady Electronic Signature(s) Signed: 06/12/2017 4:52:03 PM By: Alejandro MullingPinkerton, Debra Entered By: Alejandro MullingPinkerton, Debra on 06/12/2017 11:23:36 Troy BurnGILMORE, Troy S. ([id number]) -------------------------------------------------------------------------------- Nutrition Risk Assessment Details Patient Name: Troy BurnGILMORE, Troy S. Date of Service: 06/12/2017 10:30 AM Medical Record Number: [id number] Patient Account Number: [id number] Date of Birth/Sex: 05-18-1923 [age] y.o. Male) Treating RN: Phillis HaggisPinkerton, Debi Primary Care Baron Parmelee: Barbette ReichmannHande, Vishwanath Other Clinician: Referring Mahaila Tischer: Barbette ReichmannHande, Vishwanath Treating Ilanna Deihl/Extender: STONE III, HOYT Weeks in Treatment: 0 Height (in): 69 Weight (lbs): 226 Body Mass Index (BMI): 33.4 Nutrition Risk Assessment Items NUTRITION RISK SCREEN: I have an illness or condition that made me change the kind and/or amount of 2 Yes food I eat I eat fewer than two meals per day 0 No I eat few fruits and vegetables, or milk products 0 No I have three or more drinks of beer, liquor or wine almost every day 0 No I have tooth or mouth problems that make it hard for me to eat 0 No I don't always have enough money to buy the food I need 0 No I eat alone most of the time 0 No I take three or more different prescribed or over-the-counter drugs a day 0 No Without wanting to, I have lost or gained 10 pounds in the last six months 0 No I am not always physically able to shop, cook and/or feed myself 0 No Nutrition Protocols Good Risk Protocol Moderate Risk Protocol Electronic Signature(s) Signed: 06/12/2017 4:52:03 PM By: Alejandro MullingPinkerton, Debra Entered By: Alejandro MullingPinkerton, Debra on 06/12/2017 11:21:08

## 2017-06-17 NOTE — Progress Notes (Addendum)
Troy [name] ([id number]) Visit Report for 06/12/2017 Chief Complaint Document Details Patient Name: Troy [name], Troy [name]. Date of Service: 06/12/2017 10:30 AM Medical Record Number: [id number] Patient Account Number: [id number] Date of Birth/Sex: 1923/06/08 (84 y.o. Male) Treating RN: Phillis Haggis Primary Care Provider: Barbette Reichmann Other Clinician: Referring Provider: Barbette Reichmann Treating Provider/Extender: Linwood Dibbles, HOYT Weeks in Treatment: 0 Information Obtained from: Patient Chief Complaint Left heel pressure ulcer Electronic Signature(s) Signed: 06/15/2017 9:06:28 AM By: Lenda Kelp PA-C Entered By: Lenda Kelp on 06/15/2017 07:57:05 Troy [name] ([id number]) -------------------------------------------------------------------------------- Debridement Details Patient Name: Troy [name]. Date of Service: 06/12/2017 10:30 AM Medical Record Number: [id number] Patient Account Number: [id number] Date of Birth/Sex: 1923/12/05 (84 y.o. Male) Treating RN: Phillis Haggis Primary Care Provider: Barbette Reichmann Other Clinician: Referring Provider: Barbette Reichmann Treating Provider/Extender: Linwood Dibbles, HOYT Weeks in Treatment: 0 Debridement Performed for Wound #1 Left Calcaneus Assessment: Performed By: Physician STONE III, HOYT E., PA-C Debridement: Open Wound/Selective Debridement Description: Selective Pre-procedure Verification/Time Yes - 12:08 Out Taken: Start Time: 12:09 Pain Control: Lidocaine 4% Topical Solution Level: Non-Viable Tissue Total Area Debrided (L x W): 0.2 (cm) x 0.2 (cm) = 0.04 (cm) Tissue and other material Viable, Non-Viable, Callus debrided: Instrument: Curette Bleeding: None End Time: 12:11 Procedural Pain: 0 Post Procedural Pain: 0 Response to Treatment: Procedure was tolerated well Post Debridement Measurements of Total Wound Length: (cm) 0.2 Stage: Category/Stage III Width: (cm) 0.2 Depth: (cm) 0.2 Volume:  (cm) 0.006 Character of Wound/Ulcer Post Improved Debridement: Post Procedure Diagnosis Same as Pre-procedure Electronic Signature(s) Signed: 06/15/2017 9:06:28 AM By: Lenda Kelp PA-C Signed: 06/16/2017 4:46:02 PM By: Alejandro Mulling Previous Signature: 06/12/2017 4:52:03 PM Version By: Alejandro Mulling Entered By: Lenda Kelp on 06/15/2017 08:59:14 Troy [name] ([id number]) -------------------------------------------------------------------------------- HPI Details Patient Name: Troy [name]. Date of Service: 06/12/2017 10:30 AM Medical Record Number: [id number] Patient Account Number: [id number] Date of Birth/Sex: October 13, 1923 (84 y.o. Male) Treating RN: Phillis Haggis Primary Care Provider: Barbette Reichmann Other Clinician: Referring Provider: Barbette Reichmann Treating Provider/Extender: STONE III, HOYT Weeks in Treatment: 0 History of Present Illness Associated Signs and Symptoms: Patient has a history of diabetes mellitus type II, muscle weakness, altered mental status, COPD, Parkinson's disease, dysphagia, hypertension, chronic atrial fibrillation, heart failure, hypothyroidism. HPI Description: Skilled nursing facility notes: 04/09/17 on evaluation today patient appears to have a pressure injury to the left heel which is unfavorable at this point. Apparently this has been present since admission to the facility on 03/18/17. Unfortunately following the referral for my valuation there was confusion and this patient was inadvertently scheduled on by scheduled for Thanksgiving Day and obviously I was not there during that holiday. Therefore I missed this referral last week when I was at the facility. I did explain this to patient as well as nursing staff and apologized for the oversight. Nonetheless patient does have a pressure injury to the left heel which is a little tender to palpation although he did sleep to the majority of the evaluation today. This does not  appear to be hurting him too badly although secondary to mental status he is unable to rate or describe his pain. No arterial studies have been performed at this point. Currently a dry dressing has been utilized to cover the wound bed but otherwise no specific treatment has been initiated. No fevers, chills, nausea, or vomiting noted at this time. 04/23/17 on evaluation today patient appears to be doing much better in regard to his left heel  wound. He is not having any significant discomfort which is excellent news. The Santyl does seem to be helping him as far as the slough is concerned and the majority of this has been removed as of evaluation today. No fevers, chills, nausea, or vomiting noted at this time. Overall I'm extremely pleased with the progression he has made and there does not appear to be any new pressure injury he is still wearing the problem on offloading boots. 05/28/17 on evaluation today patient appears to be doing excellent in regard to his left heel ulcer. There does appear to be hyper granular tissue but other than this there is not any evidence of significant infection is good news there is also no slough noted at this point in time. Overall patient appears to be doing well other than the hyper granular tissue he's not having any significant discomfort. Patient's wife was present during the evaluation today fortunately I was able to discuss his care with her as well. 06/03/17 on evaluation today patient appears to be doing excellent in regard to his left heel ulcer. He has been tolerating the dressing changes without complication. The good news is the hyper granulation appears to be improved and the wound is smaller. I do believe the silver nitrate sticks did have a great benefit for him. No fevers, chills, nausea, or vomiting noted at this time. Admission Wound Center: 06/12/17 patient presents today for initial evaluation in the clinic as a transfer patient whom I have been  seen at [facility] nursing [facility] [place]. Fortunately he has been doing extremely well with current treatments despite the fact that I was not able to obtain [facility] him. In fact on presentation today this ulcer appears to be almost completely healed. He does not seem to be have any discomfort which is great news. Patient does have dementia and is currently going to be under the care of hospice although they have not started taking care of his ulcer as of yet. Patient's wife states that she has just been using dry dressing's over-the-counter for her husband to take care of his wound since she did not have any specific dressing given to her by the nursing facility and hospice also has not been present to perform the dressing changes. Electronic Signature(s) Signed: 06/15/2017 9:06:28 AM By: Lenda KelpStone III, Hoyt PA-C Entered By: Lenda KelpStone III, Hoyt on 06/15/2017 08:00:56 Troy BurnGILMORE, Powell S. ([id number] [name]) Nole, Maxximus S. ([id number]) -------------------------------------------------------------------------------- Physical Exam Details Patient Name: Troy BurnGILMORE, Roylee S. Date of Service: 06/12/2017 10:30 AM Medical Record Number: [id number] Patient Account Number: [id number] Date of Birth/Sex: 1923/08/24 63(84 y.o. Male) Treating RN: Phillis HaggisPinkerton, Debi Primary Care Provider: Barbette ReichmannHande, Vishwanath Other Clinician: Referring Provider: Barbette ReichmannHande, Vishwanath Treating Provider/Extender: STONE III, HOYT Weeks in Treatment: 0 Constitutional sitting or standing blood pressure is within target range for patient.. pulse regular and within target range for patient.[name]. respirations regular, non-labored and within target range for patient.[name]. temperature within target range for patient.. Well- nourished and well-hydrated in no acute distress. Eyes conjunctiva clear no eyelid edema noted. pupils equal round and reactive to light and accommodation. Ears, Nose, Mouth, and Throat no gross  abnormality of ear auricles or external auditory canals. normal hearing noted during conversation. mucus membranes moist. Respiratory normal breathing without difficulty. clear to auscultation bilaterally. Cardiovascular regular rate and rhythm with normal S1, S2. 2+ dorsalis pedis/posterior tibialis pulses. no clubbing, cyanosis, significant edema, <3 sec cap refill. Gastrointestinal (GI) soft, non-tender, non-distended, +BS. no ventral hernia noted.  Musculoskeletal unsteady while walking. no significant deformity or arthritic changes, no loss or range of motion, no clubbing. Psychiatric Patient is not able to cooperate in decision making regarding care. Patient has dementia. pleasant and cooperative. Notes Patient's wound bed shows evidence of excellent granulation and no slough noted and there is just a very small central area which is still open the majority of the ulcer has actually closed compared to my last evaluation at the nursing facility which was one week ago. There is no need for sharp debridement. Electronic Signature(s) Signed: 06/15/2017 9:06:28 AM By: Lenda Kelp PA-C Entered By: Lenda Kelp on 06/15/2017 07:59:45 Troy [name] ([id number]) -------------------------------------------------------------------------------- Physician Orders Details Patient Name: Troy [name] Date of Service: 06/12/2017 10:30 AM Medical Record Number: [id number] Patient Account Number: [id number] Date of Birth/Sex: 09-12-1923 (84 y.o. Male) Treating RN: Phillis Haggis Primary Care Provider: Barbette Reichmann Other Clinician: Referring Provider: Barbette Reichmann Treating Provider/Extender: Linwood Dibbles, HOYT Weeks in Treatment: 0 Verbal / Phone Orders: Yes Clinician: Pinkerton, Debi Read Back and Verified: Yes Diagnosis Coding Wound Cleansing Wound #1 Left Calcaneus o Clean wound with Normal Saline. o Cleanse wound with mild soap and water o May Shower, gently pat  wound dry prior to applying new dressing. Anesthetic (add to Medication List) Wound #1 Left Calcaneus o Topical Lidocaine 4% cream applied to wound bed prior to debridement (In Clinic Only). Primary Wound Dressing Wound #1 Left Calcaneus o Prisma Ag - moisten with saline Secondary Dressing Wound #1 Left Calcaneus o Dry Gauze o Conform/Kerlix o Other - allevyn heel cup, stretch netting #5 Dressing Change Frequency Wound #1 Left Calcaneus o Change dressing every other day. Follow-up Appointments Wound #1 Left Calcaneus o Return Appointment in 1 week. Edema Control Wound #1 Left Calcaneus o Elevate legs to the level of the heart and pump ankles as often as possible Off-Loading Wound #1 Left Calcaneus o Turn and reposition every 2 hours o Other: - elevate heels Additional Orders / Instructions Wound #1 Left Calcaneus o Increase protein intake. ABYAN, CADMAN ([id number]) Home Health Wound #1 Left Calcaneus o Continue Home Health Visits - [facility] o Home Health Nurse may visit PRN to address patientos wound care needs. o FACE TO FACE ENCOUNTER: MEDICARE and MEDICAID PATIENTS: I certify that this patient is under my care and that I had a face-to-face encounter that meets the physician face-to-face encounter requirements with this patient on this date. The encounter with the patient was in whole or in part for the following MEDICAL CONDITION: (primary reason for Home Healthcare) MEDICAL NECESSITY: I certify, that based on my findings, NURSING services are a medically necessary home health service. HOME BOUND STATUS: I certify that my clinical findings support that this patient is homebound (i.e., Due to illness or injury, pt requires aid of supportive devices such as crutches, cane, wheelchairs, walkers, the use of special transportation or the assistance of another person to leave their place of residence. There is a normal inability to leave  the home and doing so requires considerable and taxing effort. Other absences are for medical reasons / religious services and are infrequent or of short duration when for other reasons). o If current dressing causes regression in wound condition, may D/C ordered dressing product/s and apply Normal Saline Moist Dressing daily until next Wound Healing Center / Other MD appointment. Notify Wound Healing Center of regression in wound condition at [phone]. o Please direct any NON-WOUND related issues/requests for orders to patient's Primary Care  Physician Oxygen Administration Wound #1 Left Calcaneus o While patient is in clinic, provide supplimental oxygen via nasal cannula at liters/min __ : - 3 LPM via [place] Patient Medications Allergies: NKDA Notifications Medication Indication Start End lidocaine DOSE 1 - topical 4 % cream - 1 cream topical Electronic Signature(s) Signed: 06/12/2017 4:52:03 PM By: Alejandro Mulling Signed: 06/15/2017 9:06:28 AM By: Lenda Kelp PA-C Entered By: Alejandro Mulling on 06/12/2017 13:33:40 Troy [name] ([id number]) -------------------------------------------------------------------------------- Prescription 06/12/2017 Patient Name: Troy [name]. Provider: Lenda Kelp PA-C Date of Birth: 12-01-1923 NPI#: [id number] Sex: Judie Petit DEA#: [id number] Phone #: [phone] License #: Patient Address: [facility] [address] [facility] [place], [place] [address] [address], [address] [place], [place] [address] [phone] Allergies NKDA Medication Medication: Route: Strength: Form: lidocaine 4 % topical cream topical 4% cream Class: TOPICAL LOCAL ANESTHETICS Dose: Frequency / Time: Indication: 1 1 cream topical Number of Refills: Number of Units: 0 Generic Substitution: Start Date: End Date: One Time Use: Substitution Permitted No Note to  Pharmacy: Signature(s): Date(s): Electronic Signature(s) Signed: 06/12/2017 4:52:03 PM By: Alejandro Mulling Signed: 06/15/2017 9:06:28 AM By: Lenda Kelp PA-C Entered By: Alejandro Mulling on 06/12/2017 13:33:40 Troy [name] ([id number]) --------------------------------------------------------------------------------  Problem List Details Patient Name: Troy [name]. Date of Service: 06/12/2017 10:30 AM Medical Record Number: [id number] Patient Account Number: [id number] Date of Birth/Sex: 10-20-1923 (84 y.o. Male) Treating RN: Phillis Haggis Primary Care Provider: Barbette Reichmann Other Clinician: Referring Provider: Barbette Reichmann Treating Provider/Extender: Linwood Dibbles, HOYT Weeks in Treatment: 0 Active Problems ICD-10 Encounter Code Description Active Date Diagnosis L89.623 Pressure ulcer of left heel, stage 3 06/15/2017 Yes E11.621 Type 2 diabetes mellitus with foot ulcer 06/15/2017 Yes G20 Parkinson's disease 06/15/2017 Yes Inactive Problems Resolved Problems Electronic Signature(s) Signed: 06/15/2017 9:06:28 AM By: Lenda Kelp PA-C Entered By: Lenda Kelp on 06/15/2017 09:01:00 Troy [name] ([id number]) -------------------------------------------------------------------------------- Progress Note Details Patient Name: Troy [name]. Date of Service: 06/12/2017 10:30 AM Medical Record Number: [id number] Patient Account Number: [id number] Date of Birth/Sex: [birth year] (84 y.o. Male) Treating RN: Phillis Haggis Primary Care Provider: Barbette Reichmann Other Clinician: Referring Provider: Barbette Reichmann Treating Provider/Extender: Linwood Dibbles, HOYT Weeks in Treatment: 0 Subjective Chief Complaint Information obtained from Patient Left heel pressure ulcer History of Present Illness (HPI) The following HPI elements were documented for the patient's wound: Associated Signs and Symptoms: Patient has a history of diabetes mellitus type II, muscle  weakness, altered mental status, COPD, Parkinson's disease, dysphagia, hypertension, chronic atrial fibrillation, heart failure, hypothyroidism. Skilled nursing facility notes: 04/09/17 on evaluation today patient appears to have a pressure injury to the left heel which is unfavorable at this point. Apparently this has been present since admission to the facility on 03/18/17. Unfortunately following the referral for my valuation there was confusion and this patient was inadvertently scheduled on by scheduled for Thanksgiving Day and obviously I was not there during that holiday. Therefore I missed this referral last week when I was at the facility. I did explain this to patient as well as nursing staff and apologized for the oversight. Nonetheless patient does have a pressure injury to the left heel which is a little tender to palpation although he did sleep to the majority of the evaluation today. This does not appear to be hurting him too badly although secondary to mental status he is unable to rate or describe his pain. No arterial studies have been performed at  this point. Currently a dry dressing has been utilized to cover the wound bed but otherwise no specific treatment has been initiated. No fevers, chills, nausea, or vomiting noted at this time. 04/23/17 on evaluation today patient appears to be doing much better in regard to his left heel wound. He is not having any significant discomfort which is excellent news. The Santyl does seem to be helping him as far as the slough is concerned and the majority of this has been removed as of evaluation today. No fevers, chills, nausea, or vomiting noted at this time. Overall I'm extremely pleased with the progression he has made and there does not appear to be any new pressure injury he is still wearing the problem on offloading boots. 05/28/17 on evaluation today patient appears to be doing excellent in regard to his left heel ulcer. There does  appear to be hyper granular tissue but other than this there is not any evidence of significant infection is good news there is also no slough noted at this point in time. Overall patient appears to be doing well other than the hyper granular tissue he's not having any significant discomfort. Patient's wife was present during the evaluation today fortunately I was able to discuss his care with her as well. 06/03/17 on evaluation today patient appears to be doing excellent in regard to his left heel ulcer. He has been tolerating the dressing changes without complication. The good news is the hyper granulation appears to be improved and the wound is smaller. I do believe the silver nitrate sticks did have a great benefit for him. No fevers, chills, nausea, or vomiting noted at this time. Admission Wound Center: 06/12/17 patient presents today for initial evaluation in the clinic as a transfer patient whom I have been seen at the skilled nursing facility at [facility]. Fortunately he has been doing extremely well with current treatments despite the fact that I was not able to obtain [facility] Dressing's for him. In fact on presentation today this ulcer appears to be almost completely healed. He does not seem to be have any discomfort which is great news. Patient does have dementia and is currently going to be under the care of hospice although they have not started taking care of his ulcer as of yet. Patient's wife states that she has just been using dry dressing's over-the-counter for her husband to take care of his wound since she TAKASHI, KOROL. ([id number]) did not have any specific dressing given to her by the nursing facility and hospice also has not been present to perform the dressing changes. Wound History Patient presents with 1 open wound that has been present for approximately 3 months. Patient has been treating wound in the following manner: cleaning and dressing.  Laboratory tests have not been performed in the last month. Patient reportedly has not tested positive for an antibiotic resistant organism. Patient reportedly has not tested positive for osteomyelitis. Patient reportedly has not had testing performed to evaluate circulation in the legs. Patient History Information obtained from Patient. Allergies NKDA Family History Cancer - Siblings, Stroke - Mother, No family history of Diabetes, Heart Disease, Hereditary Spherocytosis, Hypertension, Kidney Disease, Lung Disease, Seizures, Thyroid Problems, Tuberculosis. Social History Former smoker - quit 30 yrs ago, Alcohol Use - Never, Drug Use - No History, Caffeine Use - Never. Medical History Respiratory Patient has history of Asthma Cardiovascular Patient has history of Arrhythmia - a-fib, Congestive Heart Failure, Hypertension Endocrine Patient has history of Type  II Diabetes Musculoskeletal Patient has history of Osteoarthritis Patient is treated with Insulin, Oral Agents. Blood sugar is tested. Review of Systems (ROS) Constitutional Symptoms (General Health) The patient has no complaints or symptoms. Eyes The patient has no complaints or symptoms. Ear/Nose/Mouth/Throat The patient has no complaints or symptoms. Hematologic/Lymphatic The patient has no complaints or symptoms. Respiratory on chronic O2 3 LPM Cardiovascular mitral valve prolapse stroke tia Gastrointestinal The patient has no complaints or symptoms. Genitourinary CKD III hx dialysis hx uti Immunological The patient has no complaints or symptoms. Integumentary (Skin) Complains or has symptoms of Wounds. Neurologic The patient has no complaints or symptoms. PATTRICK, BADY ([id number]) Oncologic The patient has no complaints or symptoms. Psychiatric The patient has no complaints or symptoms. Objective Constitutional sitting or standing blood pressure is within target range for patient.. pulse regular and  within target range for patient.[name] respirations regular, non-labored and within target range for patient.[name] temperature within target range for patient.. Well- nourished and well-hydrated in no acute distress. Vitals Time Taken: 11:06 AM, Height: 69 in, Source: Stated, Weight: 226 lbs, Source: Stated, BMI: 33.4, Temperature: 98.0  F, Pulse: 67 bpm, Respiratory Rate: 18 breaths/min, Blood Pressure: 112/59 mmHg. Eyes conjunctiva clear no eyelid edema noted. pupils equal round and reactive to light and accommodation. Ears, Nose, Mouth, and Throat no gross abnormality of ear auricles or external auditory canals. normal hearing noted during conversation. mucus membranes moist. Respiratory normal breathing without difficulty. clear to auscultation bilaterally. Cardiovascular regular rate and rhythm with normal S1, S2. 2+ dorsalis pedis/posterior tibialis pulses. no clubbing, cyanosis, significant edema, Gastrointestinal (GI) soft, non-tender, non-distended, +BS. no ventral hernia noted. Musculoskeletal unsteady while walking. no significant deformity or arthritic changes, no loss or range of motion, no clubbing. Psychiatric Patient is not able to cooperate in decision making regarding care. Patient has dementia. pleasant and cooperative. General Notes: Patient's wound bed shows evidence of excellent granulation and no slough noted and there is just a very small central area which is still open the majority of the ulcer has actually closed compared to my last evaluation at the nursing facility which was one week ago. There is no need for sharp debridement. Integumentary (Hair, Skin) Wound #1 status is Open. Original cause of wound was Pressure Injury. The wound is located on the Left Calcaneus. The wound measures 0.2cm length x 0.2cm width x 0.1cm depth; 0.031cm^2 area and 0.003cm^3 volume. There is no tunneling or undermining noted. There is a large amount of serosanguineous drainage noted. The  wound margin is distinct with the outline attached to the wound base. Periwound temperature was noted as No Abnormality. The periwound has tenderness on palpation. JAYCION, TREML ([id number]) Assessment Active Problems ICD-10 [phone] - Pressure ulcer of left heel, stage 3 E11.621 - Type 2 diabetes mellitus with foot ulcer G20 - Parkinson's disease Procedures Wound #1 Pre-procedure diagnosis of Wound #1 is a Pressure Ulcer located on the Left Calcaneus . There was a Non-Viable Tissue Open Wound/Selective [phone]) debridement with total area of 0.04 sq cm performed by STONE III, HOYT E., PA-C. with the following instrument(s): Curette to remove Viable and Non-Viable tissue/material including Callus after achieving pain control using Lidocaine 4% Topical Solution. A time out was conducted at 12:08, prior to the start of the procedure. There was no bleeding. The procedure was tolerated well with a pain level of 0 throughout and a pain level of 0 following the procedure. Post Debridement Measurements: 0.2cm length x 0.2cm width x 0.2cm depth;  0.006cm^3 volume. Post debridement Stage noted as Category/Stage III. Character of Wound/Ulcer Post Debridement is improved. Post procedure Diagnosis Wound #1: Same as Pre-Procedure Plan Wound Cleansing: Wound #1 Left Calcaneus: Clean wound with Normal Saline. Cleanse wound with mild soap and water May Shower, gently pat wound dry prior to applying new dressing. Anesthetic (add to Medication List): Wound #1 Left Calcaneus: Topical Lidocaine 4% cream applied to wound bed prior to debridement (In Clinic Only). Primary Wound Dressing: Wound #1 Left Calcaneus: Prisma Ag - moisten with saline Secondary Dressing: Wound #1 Left Calcaneus: Dry Gauze Conform/Kerlix Other - allevyn heel cup, stretch netting #5 Dressing Change Frequency: Wound #1 Left Calcaneus: Change dressing every other day. Follow-up Appointments: Wound #1 Left  Calcaneus: Return Appointment in 1 week. MCIHAEL, HINDERMAN ([id number]) Edema Control: Wound #1 Left Calcaneus: Elevate legs to the level of the heart and pump ankles as often as possible Off-Loading: Wound #1 Left Calcaneus: Turn and reposition every 2 hours Other: - elevate heels Additional Orders / Instructions: Wound #1 Left Calcaneus: Increase protein intake. Home Health: Wound #1 Left Calcaneus: Continue Home Health Visits - [facility] Nurse may visit PRN to address patient s wound care needs. FACE TO FACE ENCOUNTER: MEDICARE and MEDICAID PATIENTS: I certify that this patient is under my care and that I had a face-to-face encounter that meets the physician face-to-face encounter requirements with this patient on this date. The encounter with the patient was in whole or in part for the following MEDICAL CONDITION: (primary reason for Home Healthcare) MEDICAL NECESSITY: I certify, that based on my findings, NURSING services are a medically necessary home health service. HOME BOUND STATUS: I certify that my clinical findings support that this patient is homebound (i.e., Due to illness or injury, pt requires aid of supportive devices such as crutches, cane, wheelchairs, walkers, the use of special transportation or the assistance of another person to leave their place of residence. There is a normal inability to leave the home and doing so requires considerable and taxing effort. Other absences are for medical reasons / religious services and are infrequent or of short duration when for other reasons). If current dressing causes regression in wound condition, may D/C ordered dressing product/s and apply Normal Saline Moist Dressing daily until next Wound Healing Center / Other MD appointment. Notify Wound Healing Center of regression in wound condition at [phone]. Please direct any NON-WOUND related issues/requests for orders to patient's Primary Care  Physician Oxygen Administration: Wound #1 Left Calcaneus: While patient is in clinic, provide supplimental oxygen via nasal cannula at liters/min __ : - 3 LPM via [place] The following medication(s) was prescribed: lidocaine topical 4 % cream 1 1 cream topical was prescribed at facility At this point I'm going to suggest a silver collagen dressing just to help this wound completely close at this point. I do not believe there's any need for the [facility] Dressing at this time. Patient appears to be doing very well and his wife is caring for him in an excellent fashion at home. Hospice will be involved as of Saturday, tomorrow, as well. Hopefully this wound will be healed by next week. Please see above for specific wound care orders. We will see patient for re-evaluation in 1 week(s) here in the clinic. If anything worsens or changes patient will contact our office for additional recommendations. Electronic Signature(s) Signed: 06/23/2017 10:42:09 AM By: Lenda Kelp PA-C Previous Signature: 06/15/2017 9:06:28 AM Version By: Lenda Kelp PA-C  Entered By: Lenda Kelp on 06/23/2017 08:22:36 Troy [name] ([id number]) -------------------------------------------------------------------------------- ROS/PFSH Details Patient Name: Troy [name] Date of Service: 06/12/2017 10:30 AM Medical Record Number: [id number] Patient Account Number: [id number] Date of Birth/Sex: [birth year] (84 y.o. Male) Treating RN: Phillis Haggis Primary Care Provider: Barbette Reichmann Other Clinician: Referring Provider: Barbette Reichmann Treating Provider/Extender: Linwood Dibbles, HOYT Weeks in Treatment: 0 Information Obtained From Patient Wound History Do you currently have one or more open woundso Yes How many open wounds do you currently haveo 1 Approximately how long have you had your woundso 3 months How have you been treating your wound(s) until nowo cleaning and dressing Has your wound(s) ever  healed and then re-openedo No Have you had any lab work done in the past montho No Have you tested positive for an antibiotic resistant organism (MRSA, VRE)o No Have you tested positive for osteomyelitis (bone infection)o No Have you had any tests for circulation on your legso No Integumentary (Skin) Complaints and Symptoms: Positive for: Wounds Constitutional Symptoms (General Health) Complaints and Symptoms: No Complaints or Symptoms Eyes Complaints and Symptoms: No Complaints or Symptoms Ear/Nose/Mouth/Throat Complaints and Symptoms: No Complaints or Symptoms Hematologic/Lymphatic Complaints and Symptoms: No Complaints or Symptoms Respiratory Complaints and Symptoms: Review of System Notes: on chronic O2 3 LPM Medical History: Positive for: Asthma Cardiovascular SEVYN, PAREDEZ. ([id number]) Complaints and Symptoms: Review of System Notes: mitral valve prolapse stroke tia Medical History: Positive for: Arrhythmia - a-fib; Congestive Heart Failure; Hypertension Gastrointestinal Complaints and Symptoms: No Complaints or Symptoms Endocrine Medical History: Positive for: Type II Diabetes Time with diabetes: 25 yrs Treated with: Insulin, Oral agents Blood sugar tested every day: Yes Tested : Genitourinary Complaints and Symptoms: Review of System Notes: CKD III hx dialysis hx uti Immunological Complaints and Symptoms: No Complaints or Symptoms Musculoskeletal Medical History: Positive for: Osteoarthritis Neurologic Complaints and Symptoms: No Complaints or Symptoms Oncologic Complaints and Symptoms: No Complaints or Symptoms Psychiatric Complaints and Symptoms: No Complaints or Symptoms Immunizations Pneumococcal Vaccine: Received Pneumococcal Vaccination: Yes ETHANJAMES, FONTENOT ([id number]) Implantable Devices Family and Social History Cancer: Yes - Siblings; Diabetes: No; Heart Disease: No; Hereditary Spherocytosis: No; Hypertension: No; Kidney  Disease: No; Lung Disease: No; Seizures: No; Stroke: Yes - Mother; Thyroid Problems: No; Tuberculosis: No; Former smoker - quit 30 yrs ago; Alcohol Use: Never; Drug Use: No History; Caffeine Use: Never; Financial Concerns: No; Food, Clothing or Shelter Needs: No; Support System Lacking: No; Transportation Concerns: No; Advanced Directives: Yes (Not Provided); Do not resuscitate: No; Living Will: Yes (Not Provided); Medical Power of Attorney: Yes - Jaksen Fiorella (son) (Not Provided) Electronic Signature(s) Signed: 06/12/2017 4:52:03 PM By: Alejandro Mulling Signed: 06/15/2017 9:06:28 AM By: Lenda Kelp PA-C Entered By: Alejandro Mulling on 06/12/2017 11:19:01 Troy [name] ([id number]) -------------------------------------------------------------------------------- SuperBill Details Patient Name: Troy [name] Date of Service: 06/12/2017 Medical Record Number: [id number] Patient Account Number: [id number] Date of Birth/Sex: 30-Sep-1923 (84 y.o. Male) Treating RN: Phillis Haggis Primary Care Provider: Barbette Reichmann Other Clinician: Referring Provider: Barbette Reichmann Treating Provider/Extender: Linwood Dibbles, HOYT Weeks in Treatment: 0 Diagnosis Coding ICD-10 Codes Code Description [phone] Pressure ulcer of left heel, stage 3 E11.621 Type 2 diabetes mellitus with foot ulcer G20 Parkinson's disease Facility Procedures CPT4 Code: [id number] Description: 99213 - WOUND CARE VISIT-LEV 3 EST PT Modifier: Quantity: 1 CPT4 Code: [id number] Description: 97597 - DEBRIDE WOUND 1ST 20 SQ CM OR < ICD-10 Diagnosis Description L89.623 Pressure ulcer of left heel, stage 3 Modifier: Quantity: 1 Physician Procedures  CPT4 Code: 1610960 Description: 99213 - WC PHYS LEVEL 3 - EST PT ICD-10 Diagnosis Description L89.623 Pressure ulcer of left heel, stage 3 E11.621 Type 2 diabetes mellitus with foot ulcer G20 Parkinson's disease Modifier: 25 Quantity: 1 CPT4 Code: [id number] Description: 97597 - WC  PHYS DEBR WO ANESTH 20 SQ CM ICD-10 Diagnosis Description L89.623 Pressure ulcer of left heel, stage 3 Modifier: Quantity: 1 Electronic Signature(s) Signed: 06/15/2017 9:57:32 AM By: Lenda Kelp PA-C Previous Signature: 06/15/2017 9:06:28 AM Version By: Lenda Kelp PA-C Entered By: Lenda Kelp on 06/15/2017 09:57:04

## 2017-06-22 ENCOUNTER — Encounter: Admitting: Physician Assistant

## 2017-06-22 DIAGNOSIS — E11621 Type 2 diabetes mellitus with foot ulcer: Secondary | ICD-10-CM | POA: Diagnosis not present

## 2017-06-23 NOTE — Progress Notes (Signed)
DONTEZ, HAUSS ([id number]) Visit Report for 06/22/2017 Arrival Information Details Patient Name: Troy [name], Troy [name]. Date of Service: 06/22/2017 11:00 AM Medical Record Number: [id number] Patient Account Number: [id number] Date of Birth/Sex: 07/20/1923 (84 y.o. Male) Treating RN: Renne Crigler Primary Care Hakeem Frazzini: Barbette Reichmann Other Clinician: Referring Desare Duddy: Barbette Reichmann Treating Miracle Criado/Extender: Linwood Dibbles, HOYT Weeks in Treatment: 1 Visit Information History Since Last Visit All ordered tests and consults were completed: No Patient Arrived: Wheel Chair Added or deleted any medications: No Arrival Time: 11:01 Any new allergies or adverse reactions: No Accompanied By: wife Had a fall or experienced change in No activities of daily living that may affect Transfer Assistance: None risk of falls: Patient Identification Verified: Yes Signs or symptoms of abuse/neglect since last visito No Secondary Verification Process Completed: Yes Pain Present Now: No Patient Requires Transmission-Based No Precautions: Patient Has Alerts: Yes Patient Alerts: DM II Electronic Signature(s) Signed: 06/22/2017 11:15:43 AM By: Renne Crigler Entered By: Renne Crigler on 06/22/2017 11:02:08 [name] ([id number]) -------------------------------------------------------------------------------- Clinic Level of Care Assessment Details Patient Name: [name] Date of Service: 06/22/2017 11:00 AM Medical Record Number: [id number] Patient Account Number: [id number] Date of Birth/Sex: 09/20/1923 (84 y.o. Male) Treating RN: Renne Crigler Primary Care Ryane Canavan: Barbette Reichmann Other Clinician: Referring Khloey Chern: Barbette Reichmann Treating Shaniqua Guillot/Extender: Linwood Dibbles, HOYT Weeks in Treatment: 1 Clinic Level of Care Assessment Items TOOL 4 Quantity Score []  - Use when only an EandM is performed on FOLLOW-UP visit 0 ASSESSMENTS - Nursing Assessment /  Reassessment []  - Reassessment of Co-morbidities (includes updates in patient status) 0 X- 1 5 Reassessment of Adherence to Treatment Plan ASSESSMENTS - Wound and Skin Assessment / Reassessment []  - Simple Wound Assessment / Reassessment - one wound 0 []  - 0 Complex Wound Assessment / Reassessment - multiple wounds X- 1 10 Dermatologic / Skin Assessment (not related to wound area) ASSESSMENTS - Focused Assessment []  - Circumferential Edema Measurements - multi extremities 0 []  - 0 Nutritional Assessment / Counseling / Intervention []  - 0 Lower Extremity Assessment (monofilament, tuning fork, pulses) []  - 0 Peripheral Arterial Disease Assessment (using hand held doppler) ASSESSMENTS - Ostomy and/or Continence Assessment and Care []  - Incontinence Assessment and Management 0 []  - 0 Ostomy Care Assessment and Management (repouching, etc.) PROCESS - Coordination of Care X - Simple Patient / Family Education for ongoing care 1 15 []  - 0 Complex (extensive) Patient / Family Education for ongoing care []  - 0 Staff obtains Chiropractor, Records, Test Results / Process Orders []  - 0 Staff telephones HHA, Nursing Homes / Clarify orders / etc []  - 0 Routine Transfer to another Facility (non-emergent condition) []  - 0 Routine Hospital Admission (non-emergent condition) []  - 0 New Admissions / Manufacturing engineer / Ordering NPWT, Apligraf, etc. []  - 0 Emergency Hospital Admission (emergent condition) X- 1 10 Simple Discharge Coordination DEDRIC, ETHINGTON ([id number]) []  - 0 Complex (extensive) Discharge Coordination PROCESS - Special Needs []  - Pediatric / Minor Patient Management 0 []  - 0 Isolation Patient Management []  - 0 Hearing / Language / Visual special needs []  - 0 Assessment of Community assistance (transportation, D/C planning, etc.) []  - 0 Additional assistance / Altered mentation []  - 0 Support Surface(s) Assessment (bed, cushion, seat, etc.) INTERVENTIONS -  Wound Cleansing / Measurement []  - Simple Wound Cleansing - one wound 0 []  - 0 Complex Wound Cleansing - multiple wounds []  - 0 Wound Imaging (photographs - any number of wounds) []  - 0 Wound Tracing (instead  of photographs) []  - 0 Simple Wound Measurement - one wound []  - 0 Complex Wound Measurement - multiple wounds INTERVENTIONS - Wound Dressings []  - Small Wound Dressing one or multiple wounds 0 []  - 0 Medium Wound Dressing one or multiple wounds []  - 0 Large Wound Dressing one or multiple wounds []  - 0 Application of Medications - topical []  - 0 Application of Medications - injection INTERVENTIONS - Miscellaneous []  - External ear exam 0 []  - 0 Specimen Collection (cultures, biopsies, blood, body fluids, etc.) []  - 0 Specimen(s) / Culture(s) sent or taken to Lab for analysis []  - 0 Patient Transfer (multiple staff / Nurse, adultHoyer Lift / Similar devices) []  - 0 Simple Staple / Suture removal (25 or less) []  - 0 Complex Staple / Suture removal (26 or more) []  - 0 Hypo / Hyperglycemic Management (close monitor of Blood Glucose) []  - 0 Ankle / Brachial Index (ABI) - do not check if billed separately X- 1 5 Vital Signs [name], Jessie S. ([id number]) Has the patient been seen at the hospital within the last three years: Yes Total Score: 45 Level Of Care: New/Established - Level 2 Electronic Signature(s) Signed: 06/22/2017 5:22:46 PM By: Renne CriglerFlinchum, Cheryl Entered By: Renne CriglerFlinchum, Cheryl on 06/22/2017 11:46:49 [name], Zuhayr S. ([id number]) -------------------------------------------------------------------------------- Encounter Discharge Information Details Patient Name: [name], Khairi S. Date of Service: 06/22/2017 11:00 AM Medical Record Number: [id number] Patient Account Number: [id number] Date of Birth/Sex: 01-07-1924 13(84 y.o. Male) Treating RN: Renne CriglerFlinchum, Cheryl Primary Care Jina Olenick: Barbette ReichmannHande, Vishwanath Other Clinician: Referring Madysyn Hanken: Barbette ReichmannHande, Vishwanath Treating  Anamaria Dusenbury/Extender: Linwood DibblesSTONE III, HOYT Weeks in Treatment: 1 Encounter Discharge Information Items Discharge Pain Level: 0 Discharge Condition: Stable Ambulatory Status: Wheelchair Discharge Destination: Home Transportation: Private Auto wife and Accompanied By: caregiver Schedule Follow-up Appointment: No Medication Reconciliation completed and No provided to Patient/Care Chace Bisch: Provided on Clinical Summary of Care: 06/22/2017 Form Type Recipient Paper Patient RG Electronic Signature(s) Signed: 06/22/2017 4:58:43 PM By: Gwenlyn PerkingMoore, Shelia Entered By: Gwenlyn PerkingMoore, Shelia on 06/22/2017 11:49:39 [name], Shyhiem S. ([id number]) -------------------------------------------------------------------------------- Lower Extremity Assessment Details Patient Name: [name], Clary S. Date of Service: 06/22/2017 11:00 AM Medical Record Number: [id number] Patient Account Number: [id number] Date of Birth/Sex: 01-07-1924 [age] y.o. Male) Treating RN: Renne CriglerFlinchum, Cheryl Primary Care Mikiala Fugett: Barbette ReichmannHande, Vishwanath Other Clinician: Referring Luismanuel Corman: Barbette ReichmannHande, Vishwanath Treating Sarinity Dicicco/Extender: Linwood DibblesSTONE III, HOYT Weeks in Treatment: 1 Edema Assessment Assessed: [Left: No] [Right: No] Edema: [Left: Ye] [Right: s] Calf Left: Right: Point of Measurement: 33 cm From Medial Instep 36.8 cm cm Ankle Left: Right: Point of Measurement: 13 cm From Medial Instep 23.5 cm cm Vascular Assessment Claudication: Claudication Assessment [Left:None] Pulses: Dorsalis Pedis Palpable: [Left:Yes] Posterior Tibial Extremity colors, hair growth, and conditions: Extremity Color: [Left:Normal] Hair Growth on Extremity: [Left:Yes] Temperature of Extremity: [Left:Cool] Capillary Refill: [Left:< 3 seconds] Toe Nail Assessment Left: Right: Thick: Yes Discolored: Yes Deformed: Yes Improper Length and Hygiene: Yes Electronic Signature(s) Signed: 06/22/2017 11:15:43 AM By: Renne CriglerFlinchum, Cheryl Entered By: Renne CriglerFlinchum, Cheryl on 06/22/2017  11:11:13 [name], Mansel S. ([id number]) -------------------------------------------------------------------------------- Multi Wound Chart Details Patient Name: [name], Kevontae S. Date of Service: 06/22/2017 11:00 AM Medical Record Number: [id number] Patient Account Number: [id number] Date of Birth/Sex: 01-07-1924 13(84 y.o. Male) Treating RN: Renne CriglerFlinchum, Cheryl Primary Care Freyja Govea: Barbette ReichmannHande, Vishwanath Other Clinician: Referring Krishika Bugge: Barbette ReichmannHande, Vishwanath Treating Saleem Coccia/Extender: Linwood DibblesSTONE III, HOYT Weeks in Treatment: 1 Vital Signs Height(in): 69 Pulse(bpm): 75 Weight(lbs): 226 Blood Pressure(mmHg): 124/58 Body Mass Index(BMI): 33 Temperature(F): 98.3 Respiratory Rate 18 (breaths/min): Photos: [1:No Photos] [N/A:N/A] Wound Location: [1:Left Calcaneus] [N/A:N/A] Wounding Event: [  1:Pressure Injury] [N/A:N/A] Primary Etiology: [1:Pressure Ulcer] [N/A:N/A] Comorbid History: [1:Asthma, Arrhythmia, Congestive Heart Failure, Hypertension, Type II Diabetes, Osteoarthritis] [N/A:N/A] Date Acquired: [1:03/12/2017] [N/A:N/A] Weeks of Treatment: [1:1] [N/A:N/A] Wound Status: [1:Open] [N/A:N/A] Measurements L x W x D [1:0.2x0.2x0.1] [N/A:N/A] (cm) Area (cm) : [1:0.031] [N/A:N/A] Volume (cm) : [1:0.003] [N/A:N/A] % Reduction in Area: [1:0.00%] [N/A:N/A] % Reduction in Volume: [1:0.00%] [N/A:N/A] Classification: [1:Category/Stage III] [N/A:N/A] Exudate Amount: [1:Small] [N/A:N/A] Exudate Type: [1:Serosanguineous] [N/A:N/A] Exudate Color: [1:red, brown] [N/A:N/A] Wound Margin: [1:Distinct, outline attached] [N/A:N/A] Granulation Amount: [1:Large (67-100%)] [N/A:N/A] Necrotic Amount: [1:Small (1-33%)] [N/A:N/A] Necrotic Tissue: [1:Eschar] [N/A:N/A] Exposed Structures: [1:Fascia: No Fat Layer (Subcutaneous Tissue) Exposed: No Tendon: No Muscle: No Joint: No Bone: No] [N/A:N/A] Epithelialization: [1:Large (67-100%)] [N/A:N/A] Periwound Skin Texture: [1:Callus: Yes Excoriation: No  Induration: No Crepitus: No] [N/A:N/A] Rash: No Scarring: No Periwound Skin Moisture: Dry/Scaly: Yes N/A N/A Maceration: No Periwound Skin Color: Atrophie Blanche: No N/A N/A Cyanosis: No Ecchymosis: No Erythema: No Hemosiderin Staining: No Mottled: No Pallor: No Rubor: No Temperature: No Abnormality N/A N/A Tenderness on Palpation: Yes N/A N/A Wound Preparation: Ulcer Cleansing: N/A N/A Rinsed/Irrigated with Saline Topical Anesthetic Applied: Other: lidocaine 4% Treatment Notes Electronic Signature(s) Signed: 06/22/2017 11:15:43 AM By: Renne Crigler Entered By: Renne Crigler on 06/22/2017 11:11:27 [name] ([id number]) -------------------------------------------------------------------------------- Multi-Disciplinary Care Plan Details Patient Name: NASER, SCHULD. Date of Service: 06/22/2017 11:00 AM Medical Record Number: [id number] Patient Account Number: [id number] Date of Birth/Sex: 02-Jun-1923 (84 y.o. Male) Treating RN: Renne Crigler Primary Care Maleya Leever: Barbette Reichmann Other Clinician: Referring Kynsley Whitehouse: Barbette Reichmann Treating Moneisha Vosler/Extender: Linwood Dibbles, HOYT Weeks in Treatment: 1 Active Inactive Electronic Signature(s) Signed: 06/22/2017 5:22:46 PM By: Renne Crigler Previous Signature: 06/22/2017 11:15:43 AM Version By: Renne Crigler Entered By: Renne Crigler on 06/22/2017 11:48:37 [name] ([id number]) -------------------------------------------------------------------------------- Pain Assessment Details Patient Name: [name]. Date of Service: 06/22/2017 11:00 AM Medical Record Number: [id number] Patient Account Number: [id number] Date of Birth/Sex: [birth year] (84 y.o. Male) Treating RN: Renne Crigler Primary Care Ata Pecha: Barbette Reichmann Other Clinician: Referring Oseph Imburgia: Barbette Reichmann Treating Darrik Richman/Extender: Linwood Dibbles, HOYT Weeks in Treatment: 1 Active Problems Location of Pain  Severity and Description of Pain Patient Has Paino No Site Locations Pain Management and Medication Current Pain Management: Electronic Signature(s) Signed: 06/22/2017 11:15:43 AM By: Renne Crigler Entered By: Renne Crigler on 06/22/2017 11:02:17 [name] ([id number]) -------------------------------------------------------------------------------- Patient/Caregiver Education Details Patient Name: [name] Date of Service: 06/22/2017 11:00 AM Medical Record Number: [id number] Patient Account Number: [id number] Date of Birth/Gender: [birth year] (84 y.o. Male) Treating RN: Renne Crigler Primary Care Physician: Barbette Reichmann Other Clinician: Referring Physician: Barbette Reichmann Treating Physician/Extender: Skeet Simmer in Treatment: 1 Education Assessment Education Provided To: Patient Education Topics Provided Wound/Skin Impairment: Handouts: Skin Care Do's and Dont's Methods: Explain/Verbal Responses: State content correctly Electronic Signature(s) Signed: 06/22/2017 5:22:46 PM By: Renne Crigler Entered By: Renne Crigler on 06/22/2017 11:47:55 [name] ([id number]) -------------------------------------------------------------------------------- Wound Assessment Details Patient Name: [name]. Date of Service: 06/22/2017 11:00 AM Medical Record Number: [id number] Patient Account Number: [id number] Date of Birth/Sex: 1924/03/15 (84 y.o. Male) Treating RN: Renne Crigler Primary Care Wyonia Fontanella: Barbette Reichmann Other Clinician: Referring Jermarcus Mcfadyen: Barbette Reichmann Treating Wong Steadham/Extender: STONE III, HOYT Weeks in Treatment: 1 Wound Status Wound Number: 1 Primary Pressure Ulcer Etiology: Wound Location: Left Calcaneus Wound Healed - Epithelialized Wounding Event: Pressure Injury Status: Date Acquired: 03/12/2017 Comorbid Asthma, Arrhythmia, Congestive Heart Failure, Weeks Of Treatment: 1 History:  Hypertension, Type II Diabetes, Osteoarthritis Clustered  Wound: No Wound Measurements Length: (cm) 0 % R Width: (cm) 0 % R Depth: (cm) 0 Epi Area: (cm) 0 Tu Volume: (cm) 0 Un eduction in Area: 100% eduction in Volume: 100% thelialization: Large (67-100%) nneling: No dermining: No Wound Description Classification: Category/Stage III Wound Margin: Distinct, outline attached Exudate Amount: Small Exudate Type: Serosanguineous Exudate Color: red, brown Foul Odor After Cleansing: No Slough/Fibrino Yes Wound Bed Granulation Amount: Large (67-100%) Exposed Structure Necrotic Amount: Small (1-33%) Fascia Exposed: No Necrotic Quality: Eschar Fat Layer (Subcutaneous Tissue) Exposed: No Tendon Exposed: No Muscle Exposed: No Joint Exposed: No Bone Exposed: No Periwound Skin Texture Texture Color No Abnormalities Noted: No No Abnormalities Noted: No Callus: Yes Atrophie Blanche: No Crepitus: No Cyanosis: No Excoriation: No Ecchymosis: No Induration: No Erythema: No Rash: No Hemosiderin Staining: No Scarring: No Mottled: No Pallor: No Moisture Rubor: No No Abnormalities Noted: No Dry / Scaly: Yes Temperature / Pain Maceration: No Temperature: No Abnormality Tenderness on Palpation: Yes KURT, HOFFMEIER ([id number]) Wound Preparation Ulcer Cleansing: Rinsed/Irrigated with Saline Topical Anesthetic Applied: Other: lidocaine 4%, Treatment Notes Wound #1 (Left Calcaneus) 3. Peri-wound Care: Moisturizing lotion Electronic Signature(s) Signed: 06/22/2017 5:22:46 PM By: Renne Crigler Previous Signature: 06/22/2017 11:15:43 AM Version By: Renne Crigler Entered By: Renne Crigler on 06/22/2017 11:38:49 [name] ([id number]) -------------------------------------------------------------------------------- Vitals Details Patient Name: [name]. Date of Service: 06/22/2017 11:00 AM Medical Record Number: [id number] Patient Account Number:  [id number] Date of Birth/Sex: [birth year] (84 y.o. Male) Treating RN: Renne Crigler Primary Care Shalini Mair: Barbette Reichmann Other Clinician: Referring Solash Tullo: Barbette Reichmann Treating Keyandra Swenson/Extender: Linwood Dibbles, HOYT Weeks in Treatment: 1 Vital Signs Time Taken: 11:02 Temperature (F): 98.3 Height (in): 69 Pulse (bpm): 75 Weight (lbs): 226 Respiratory Rate (breaths/min): 18 Body Mass Index (BMI): 33.4 Blood Pressure (mmHg): 124/58 Reference Range: 80 - 120 mg / dl Electronic Signature(s) Signed: 06/22/2017 11:15:43 AM By: Renne Crigler Entered By: Renne Crigler on 06/22/2017 11:02:35

## 2017-06-23 NOTE — Progress Notes (Signed)
Troy, [name] ([id number]) Visit Report for 06/22/2017 Chief Complaint Document Details Patient Name: Troy [name], Troy [name]. Date of Service: 06/22/2017 11:00 AM Medical Record Number: [id number] Patient Account Number: [id number] Date of Birth/Sex: July 13, 1923 (84 y.o. Male) Treating RN: Renne Crigler Primary Care Provider: Barbette Reichmann Other Clinician: Referring Provider: Barbette Reichmann Treating Provider/Extender: Linwood Dibbles, Juanna Pudlo Weeks in Treatment: 1 Information Obtained from: Patient Chief Complaint Left heel pressure ulcer Electronic Signature(s) Signed: 06/22/2017 5:42:41 PM By: Lenda Kelp PA-C Entered By: Lenda Kelp on 06/22/2017 11:10:54 Troy [name] ([id number]) -------------------------------------------------------------------------------- HPI Details Patient Name: Troy [name] Date of Service: 06/22/2017 11:00 AM Medical Record Number: [id number] Patient Account Number: [id number] Date of Birth/Sex: 1924/03/21 (84 y.o. Male) Treating RN: Renne Crigler Primary Care Provider: Barbette Reichmann Other Clinician: Referring Provider: Barbette Reichmann Treating Provider/Extender: Linwood Dibbles, Levonte Molina Weeks in Treatment: 1 History of Present Illness Associated Signs and Symptoms: Patient has a history of diabetes mellitus type II, muscle weakness, altered mental status, COPD, Parkinson's disease, dysphagia, hypertension, chronic atrial fibrillation, heart failure, hypothyroidism. HPI Description: Skilled nursing facility notes: 04/09/17 on evaluation today patient appears to have a pressure injury to the left heel which is unfavorable at this point. Apparently this has been present since admission to the facility on 03/18/17. Unfortunately following the referral for my valuation there was confusion and this patient was inadvertently scheduled on by scheduled for Thanksgiving Day and obviously I was not there during that holiday. Therefore I missed this  referral last week when I was at the facility. I did explain this to patient as well as nursing staff and apologized for the oversight. Nonetheless patient does have a pressure injury to the left heel which is a little tender to palpation although he did sleep to the majority of the evaluation today. This does not appear to be hurting him too badly although secondary to mental status he is unable to rate or describe his pain. No arterial studies have been performed at this point. Currently a dry dressing has been utilized to cover the wound bed but otherwise no specific treatment has been initiated. No fevers, chills, nausea, or vomiting noted at this time. 04/23/17 on evaluation today patient appears to be doing much better in regard to his left heel wound. He is not having any significant discomfort which is excellent news. The Santyl does seem to be helping him as far as the slough is concerned and the majority of this has been removed as of evaluation today. No fevers, chills, nausea, or vomiting noted at this time. Overall I'm extremely pleased with the progression he has made and there does not appear to be any new pressure injury he is still wearing the problem on offloading boots. 05/28/17 on evaluation today patient appears to be doing excellent in regard to his left heel ulcer. There does appear to be hyper granular tissue but other than this there is not any evidence of significant infection is good news there is also no slough noted at this point in time. Overall patient appears to be doing well other than the hyper granular tissue he's not having any significant discomfort. Patient's wife was present during the evaluation today fortunately I was able to discuss his care with her as well. 06/03/17 on evaluation today patient appears to be doing excellent in regard to his left heel ulcer. He has been tolerating the dressing changes without complication. The good news is the hyper  granulation appears to be improved and the wound  is smaller. I do believe the silver nitrate sticks did have a great benefit for him. No fevers, chills, nausea, or vomiting noted at this time. Admission Wound Center: 06/12/17 patient presents today for initial evaluation in the clinic as a transfer patient whom I have been seen at the skilled nursing facility at [facility]. Fortunately he has been doing extremely well with current treatments despite the fact that I was not able to obtain [facility] Dressing's for him. In fact on presentation today this ulcer appears to be almost completely healed. He does not seem to be have any discomfort which is great news. Patient does have dementia and is currently going to be under the care of hospice although they have not started taking care of his ulcer as of yet. Patient's wife states that she has just been using dry dressing's over-the-counter for her husband to take care of his wound since she did not have any specific dressing given to her by the nursing facility and hospice also has not been present to perform the dressing changes. /18/19 on evaluation today patient appears to be doing fairly well and in fact is completely healed in regard to his left heel ulcer. He has been tolerating the dressing changes without complication up to this point. Fortunately he have made excellent progress in the past several weeks especially since being discharged from the nursing facility his wife has been caring for him very well at home. Troy, [name] ([id number]) Electronic Signature(s) Signed: 06/22/2017 5:42:41 PM By: Lenda Kelp PA-C Entered By: Lenda Kelp on 06/22/2017 12:51:01 [name], Troy ([id number]) -------------------------------------------------------------------------------- Physical Exam Details Patient Name: Troy, [name]. Date of Service: 06/22/2017 11:00 AM Medical Record Number: [id number] Patient  Account Number: [id number] Date of Birth/Sex: 1924/04/30 (84 y.o. Male) Treating RN: Renne Crigler Primary Care Provider: Barbette Reichmann Other Clinician: Referring Provider: Barbette Reichmann Treating Provider/Extender: STONE III, Gee Habig Weeks in Treatment: 1 Constitutional Well-nourished and well-hydrated in no acute distress. Respiratory normal breathing without difficulty. Psychiatric this patient is able to make decisions and demonstrates good insight into disease process. Alert and Oriented x 3. pleasant and cooperative. Notes Patient's wound appears to be healed there's some dry skin overlying the area of the wound sites but again most of this is coming off and that surely there does not appear to be any evidence of infection on evaluation today. I'm very pleased the fact that patients wound healed so rapidly following the chemical cauterization of hyper granular tissue. Patient's wife is likewise extremely pleased. Electronic Signature(s) Signed: 06/22/2017 5:42:41 PM By: Lenda Kelp PA-C Entered By: Lenda Kelp on 06/22/2017 12:51:59 Troy [name] ([id number]) -------------------------------------------------------------------------------- Physician Orders Details Patient Name: Troy [name] Date of Service: 06/22/2017 11:00 AM Medical Record Number: [id number] Patient Account Number: [id number] Date of Birth/Sex: 07-01-1923 (84 y.o. Male) Treating RN: Renne Crigler Primary Care Provider: Barbette Reichmann Other Clinician: Referring Provider: Barbette Reichmann Treating Provider/Extender: Linwood Dibbles, Lizann Edelman Weeks in Treatment: 1 Verbal / Phone Orders: No Diagnosis Coding ICD-10 Coding Code Description [id number] Pressure ulcer of left heel, stage 3 E11.621 Type 2 diabetes mellitus with foot ulcer G20 Parkinson's disease Wound Cleansing o Clean wound with Normal Saline. Skin Barriers/Peri-Wound Care o Moisturizing lotion Edema Control o Patient to  wear own compression stockings Discharge From [facility] o Discharge from Wound Care Center - treatment completed Notes wear protective foam on left heel for 2 weeks to protect heel. Electronic Signature(s) Signed: 06/22/2017 5:22:46 PM By:  Renne CriglerFlinchum, Cheryl Signed: 06/22/2017 5:42:41 PM By: Lenda KelpStone III, Yuliana Vandrunen PA-C Entered By: Renne CriglerFlinchum, Cheryl on 06/22/2017 11:46:23 Troy BurnGILMORE, Devan S. ([id number]) -------------------------------------------------------------------------------- Problem List Details Patient Name: Troy BurnGILMORE, Dearis S. Date of Service: 06/22/2017 11:00 AM Medical Record Number: [id number] Patient Account Number: [id number] Date of Birth/Sex: 05/17/1923 [age] y.o. Male) Treating RN: Renne CriglerFlinchum, Cheryl Primary Care Provider: Barbette ReichmannHande, Vishwanath Other Clinician: Referring Provider: Barbette ReichmannHande, Vishwanath Treating Provider/Extender: Linwood DibblesSTONE III, Kirah Stice Weeks in Treatment: 1 Active Problems ICD-10 Encounter Code Description Active Date Diagnosis L89.623 Pressure ulcer of left heel, stage 3 06/15/2017 Yes E11.621 Type 2 diabetes mellitus with foot ulcer 06/15/2017 Yes G20 Parkinson's disease 06/15/2017 Yes Inactive Problems Resolved Problems Electronic Signature(s) Signed: 06/22/2017 5:42:41 PM By: Lenda KelpStone III, Solyana Nonaka PA-C Entered By: Lenda KelpStone III, Armany Mano on 06/22/2017 11:10:47 Troy BurnGILMORE, Rafe S. ([id number]) -------------------------------------------------------------------------------- Progress Note Details Patient Name: Troy BurnGILMORE, Johnte S. Date of Service: 06/22/2017 11:00 AM Medical Record Number: [id number] Patient Account Number: [id number] Date of Birth/Sex: 05/17/1923 [age] y.o. Male) Treating RN: Renne CriglerFlinchum, Cheryl Primary Care Provider: Barbette ReichmannHande, Vishwanath Other Clinician: Referring Provider: Barbette ReichmannHande, Vishwanath Treating Provider/Extender: Linwood DibblesSTONE III, Deno Sida Weeks in Treatment: 1 Subjective Chief Complaint Information obtained from Patient Left heel pressure ulcer History of Present Illness  (HPI) The following HPI elements were documented for the patient's wound: Associated Signs and Symptoms: Patient has a history of diabetes mellitus type II, muscle weakness, altered mental status, COPD, Parkinson's disease, dysphagia, hypertension, chronic atrial fibrillation, heart failure, hypothyroidism. Skilled nursing facility notes: 04/09/17 on evaluation today patient appears to have a pressure injury to the left heel which is unfavorable at this point. Apparently this has been present since admission to the facility on 03/18/17. Unfortunately following the referral for my valuation there was confusion and this patient was inadvertently scheduled on by scheduled for Thanksgiving Day and obviously I was not there during that holiday. Therefore I missed this referral last week when I was at the facility. I did explain this to patient as well as nursing staff and apologized for the oversight. Nonetheless patient does have a pressure injury to the left heel which is a little tender to palpation although he did sleep to the majority of the evaluation today. This does not appear to be hurting him too badly although secondary to mental status he is unable to rate or describe his pain. No arterial studies have been performed at this point. Currently a dry dressing has been utilized to cover the wound bed but otherwise no specific treatment has been initiated. No fevers, chills, nausea, or vomiting noted at this time. 04/23/17 on evaluation today patient appears to be doing much better in regard to his left heel wound. He is not having any significant discomfort which is excellent news. The Santyl does seem to be helping him as far as the slough is concerned and the majority of this has been removed as of evaluation today. No fevers, chills, nausea, or vomiting noted at this time. Overall I'm extremely pleased with the progression he has made and there does not appear to be any new pressure injury he  is still wearing the problem on offloading boots. 05/28/17 on evaluation today patient appears to be doing excellent in regard to his left heel ulcer. There does appear to be hyper granular tissue but other than this there is not any evidence of significant infection is good news there is also no slough noted at this point in time. Overall patient appears to be doing well other than the hyper granular tissue he's not having  any significant discomfort. Patient's wife was present during the evaluation today fortunately I was able to discuss his care with her as well. 06/03/17 on evaluation today patient appears to be doing excellent in regard to his left heel ulcer. He has been tolerating the dressing changes without complication. The good news is the hyper granulation appears to be improved and the wound is smaller. I do believe the silver nitrate sticks did have a great benefit for him. No fevers, chills, nausea, or vomiting noted at this time. Admission Wound Center: 06/12/17 patient presents today for initial evaluation in the clinic as a transfer patient whom I have been seen at the skilled nursing facility at [facility]. Fortunately he has been doing extremely well with current treatments despite the fact that I was not able to obtain [facility] Dressing's for him. In fact on presentation today this ulcer appears to be almost completely healed. He does not seem to be have any discomfort which is great news. Patient does have dementia and is currently going to be under the care of hospice although they have not started taking care of his ulcer as of yet. Patient's wife states that she has just been using dry dressing's over-the-counter for her husband to take care of his wound since she AADIT, HAGOOD. ([id number]) did not have any specific dressing given to her by the nursing facility and hospice also has not been present to perform the dressing changes. /18/19 on  evaluation today patient appears to be doing fairly well and in fact is completely healed in regard to his left heel ulcer. He has been tolerating the dressing changes without complication up to this point. Fortunately he have made excellent progress in the past several weeks especially since being discharged from the nursing facility his wife has been caring for him very well at home. Patient History Information obtained from Patient. Family History Cancer - Siblings, Stroke - Mother, No family history of Diabetes, Heart Disease, Hereditary Spherocytosis, Hypertension, Kidney Disease, Lung Disease, Seizures, Thyroid Problems, Tuberculosis. Social History Former smoker - quit 30 yrs ago, Alcohol Use - Never, Drug Use - No History, Caffeine Use - Never. Review of Systems (ROS) Constitutional Symptoms (General Health) Denies complaints or symptoms of Fever, Chills. Respiratory The patient has no complaints or symptoms. Cardiovascular Complains or has symptoms of LE edema. Objective Constitutional Well-nourished and well-hydrated in no acute distress. Vitals Time Taken: 11:02 AM, Height: 69 in, Weight: 226 lbs, BMI: 33.4, Temperature: 98.3 F, Pulse: 75 bpm, Respiratory Rate: 18 breaths/min, Blood Pressure: 124/58 mmHg. Respiratory normal breathing without difficulty. Psychiatric this patient is able to make decisions and demonstrates good insight into disease process. Alert and Oriented x 3. pleasant and cooperative. General Notes: Patient's wound appears to be healed there's some dry skin overlying the area of the wound sites but again most of this is coming off and that surely there does not appear to be any evidence of infection on evaluation today. I'm very pleased the fact that patients wound healed so rapidly following the chemical cauterization of hyper granular tissue. Patient's wife is likewise extremely pleased. Integumentary (Hair, Skin) ELDEAN, NANNA.  ([id number]) Wound #1 status is Healed - Epithelialized. Original cause of wound was Pressure Injury. The wound is located on the Left Calcaneus. The wound measures 0cm length x 0cm width x 0cm depth; 0cm^2 area and 0cm^3 volume. There is no tunneling or undermining noted. There is a small amount of serosanguineous drainage noted. The wound  margin is distinct with the outline attached to the wound base. There is large (67-100%) granulation within the wound bed. There is a small (1-33%) amount of necrotic tissue within the wound bed including Eschar. The periwound skin appearance exhibited: Callus, Dry/Scaly. The periwound skin appearance did not exhibit: Crepitus, Excoriation, Induration, Rash, Scarring, Maceration, Atrophie Blanche, Cyanosis, Ecchymosis, Hemosiderin Staining, Mottled, Pallor, Rubor, Erythema. Periwound temperature was noted as No Abnormality. The periwound has tenderness on palpation. Assessment Active Problems ICD-10 Z61.096 - Pressure ulcer of left heel, stage 3 E11.621 - Type 2 diabetes mellitus with foot ulcer G20 - Parkinson's disease Plan Wound Cleansing: Clean wound with Normal Saline. Skin Barriers/Peri-Wound Care: Moisturizing lotion Edema Control: Patient to wear own compression stockings Discharge From [facility] Services: Discharge from Wound Care Center - treatment completed General Notes: wear protective foam on left heel for 2 weeks to protect heel. I am going to recommend that we discharge patient from wound care services at this point. If there any complications or concerns patient's wife was advised that he can definitely come back as needed in the future obviously we hope that this will not be the case and he will have new for the wounds with which we need to manage. Nonetheless it was a pleasure caring for him as transient home at this point I did recommend a horn/callous pad overlying the heel area in order to avoid any additional injury the site he starts  to get around and not utilize the dressings as much at this point. Electronic Signature(s) Signed: 06/22/2017 5:42:41 PM By: Lenda Kelp PA-C Entered By: Lenda Kelp on 06/22/2017 12:52:51 Troy [name] ([id number]) -------------------------------------------------------------------------------- ROS/PFSH Details Patient Name: Troy [name] Date of Service: 06/22/2017 11:00 AM Medical Record Number: [id number] Patient Account Number: [id number] Date of Birth/Sex: 05-Mar-1924 (84 y.o. Male) Treating RN: Renne Crigler Primary Care Provider: Barbette Reichmann Other Clinician: Referring Provider: Barbette Reichmann Treating Provider/Extender: Linwood Dibbles, Celia Gibbons Weeks in Treatment: 1 Information Obtained From Patient Wound History Do you currently have one or more open woundso Yes How many open wounds do you currently haveo 1 Approximately how long have you had your woundso 3 months How have you been treating your wound(s) until nowo cleaning and dressing Has your wound(s) ever healed and then re-openedo No Have you had any lab work done in the past montho No Have you tested positive for an antibiotic resistant organism (MRSA, VRE)o No Have you tested positive for osteomyelitis (bone infection)o No Have you had any tests for circulation on your legso No Constitutional Symptoms (General Health) Complaints and Symptoms: Negative for: Fever; Chills Cardiovascular Complaints and Symptoms: Positive for: LE edema Medical History: Positive for: Arrhythmia - a-fib; Congestive Heart Failure; Hypertension Respiratory Complaints and Symptoms: No Complaints or Symptoms Medical History: Positive for: Asthma Endocrine Medical History: Positive for: Type II Diabetes Time with diabetes: 25 yrs Treated with: Insulin, Oral agents Blood sugar tested every day: Yes Tested : Musculoskeletal Medical History: Positive for: Osteoarthritis Immunizations MANOLITO, JUREWICZ  ([id number]) Pneumococcal Vaccine: Received Pneumococcal Vaccination: Yes Implantable Devices Family and Social History Cancer: Yes - Siblings; Diabetes: No; Heart Disease: No; Hereditary Spherocytosis: No; Hypertension: No; Kidney Disease: No; Lung Disease: No; Seizures: No; Stroke: Yes - Mother; Thyroid Problems: No; Tuberculosis: No; Former smoker - quit 30 yrs ago; Alcohol Use: Never; Drug Use: No History; Caffeine Use: Never; Financial Concerns: No; Food, Clothing or Shelter Needs: No; Support System Lacking: No; Transportation Concerns: No; Advanced Directives: Yes (Not Provided); Patient  does not want information on Advanced Directives; Do not resuscitate: No; Living Will: Yes (Not Provided); Medical Power of Attorney: Yes - Sunil Hue (son) (Not Provided) Physician Affirmation I have reviewed and agree with the above information. Electronic Signature(s) Signed: 06/22/2017 5:22:46 PM By: Renne Crigler Signed: 06/22/2017 5:42:41 PM By: Lenda Kelp PA-C Entered By: Lenda Kelp on 06/22/2017 12:51:36 Troy [name] ([id number]) -------------------------------------------------------------------------------- SuperBill Details Patient Name: Troy [name] Date of Service: 06/22/2017 Medical Record Number: [id number] Patient Account Number: [id number] Date of Birth/Sex: [birth year] (84 y.o. Male) Treating RN: Renne Crigler Primary Care Provider: Barbette Reichmann Other Clinician: Referring Provider: Barbette Reichmann Treating Provider/Extender: Linwood Dibbles, Jamal Haskin Weeks in Treatment: 1 Diagnosis Coding ICD-10 Codes Code Description [phone] Pressure ulcer of left heel, stage 3 E11.621 Type 2 diabetes mellitus with foot ulcer G20 Parkinson's disease Facility Procedures CPT4 Code: [id number] Description: [phone] - WOUND CARE VISIT-LEV 2 EST PT Modifier: Quantity: 1 Physician Procedures CPT4 Code: [id number] Description: 32440 - WC PHYS LEVEL 2 - EST PT ICD-10 Diagnosis  Description L89.623 Pressure ulcer of left heel, stage 3 E11.621 Type 2 diabetes mellitus with foot ulcer G20 Parkinson's disease Modifier: Quantity: 1 Electronic Signature(s) Signed: 06/22/2017 5:42:41 PM By: Lenda Kelp PA-C Entered By: Lenda Kelp on 06/22/2017 12:53:41

## 2017-07-20 ENCOUNTER — Emergency Department
Admission: EM | Admit: 2017-07-20 | Discharge: 2017-07-20 | Disposition: A | Discharge status: Home / Self Care | Attending: Emergency Medicine | Admitting: Emergency Medicine

## 2017-07-20 ENCOUNTER — Other Ambulatory Visit: Payer: Self-pay

## 2017-07-20 DIAGNOSIS — Z794 Long term (current) use of insulin: Secondary | ICD-10-CM | POA: Diagnosis not present

## 2017-07-20 DIAGNOSIS — G20 Parkinson's disease: Secondary | ICD-10-CM | POA: Insufficient documentation

## 2017-07-20 DIAGNOSIS — Z79899 Other long term (current) drug therapy: Secondary | ICD-10-CM | POA: Insufficient documentation

## 2017-07-20 DIAGNOSIS — E039 Hypothyroidism, unspecified: Secondary | ICD-10-CM | POA: Diagnosis not present

## 2017-07-20 DIAGNOSIS — E1122 Type 2 diabetes mellitus with diabetic chronic kidney disease: Secondary | ICD-10-CM | POA: Diagnosis not present

## 2017-07-20 DIAGNOSIS — Z950 Presence of cardiac pacemaker: Secondary | ICD-10-CM | POA: Insufficient documentation

## 2017-07-20 DIAGNOSIS — J449 Chronic obstructive pulmonary disease, unspecified: Secondary | ICD-10-CM | POA: Insufficient documentation

## 2017-07-20 DIAGNOSIS — K068 Other specified disorders of gingiva and edentulous alveolar ridge: Secondary | ICD-10-CM | POA: Insufficient documentation

## 2017-07-20 DIAGNOSIS — I5042 Chronic combined systolic (congestive) and diastolic (congestive) heart failure: Secondary | ICD-10-CM | POA: Diagnosis not present

## 2017-07-20 DIAGNOSIS — Z87891 Personal history of nicotine dependence: Secondary | ICD-10-CM | POA: Diagnosis not present

## 2017-07-20 DIAGNOSIS — I130 Hypertensive heart and chronic kidney disease with heart failure and stage 1 through stage 4 chronic kidney disease, or unspecified chronic kidney disease: Secondary | ICD-10-CM | POA: Diagnosis not present

## 2017-07-20 DIAGNOSIS — Z7901 Long term (current) use of anticoagulants: Secondary | ICD-10-CM | POA: Insufficient documentation

## 2017-07-20 DIAGNOSIS — N183 Chronic kidney disease, stage 3 (moderate): Secondary | ICD-10-CM | POA: Insufficient documentation

## 2017-07-20 NOTE — ED Notes (Signed)
Pt discharged to home.  Family member driving.  Discharge instructions reviewed.  Verbalized understanding.  No questions or concerns at this time.  Teach back verified.  Pt in NAD.  No items left in ED.   

## 2017-07-20 NOTE — ED Triage Notes (Signed)
Pt arrived via [facility] from home. Via EMS pt was bleeding from mouth after eating cashews.

## 2017-07-20 NOTE — Discharge Instructions (Signed)
Please eat a soft diet over the next several days while your gums heal. If the bleeding starts again bite down on a towel for 15 minutes

## 2017-07-20 NOTE — ED Provider Notes (Signed)
[facility] Emergency Department Provider Note   ____________________________________________    I have reviewed the triage vital signs and the nursing notes.   HISTORY  Chief Complaint Mouth Injury     HPI Troy [name] is a 84 y.o. male on Eliquis for atrial fibrillation who presents via EMS with bleeding from the mouth.  Apparently the patient was eating cashews at home when all of a sudden he started to have bleeding from his mouth, he does not know where it was coming from.  He was not vomiting.  No abdominal pain.  No shortness of breath no coughing.  EMS had the patient by down on gauze and now the bleeding has subsided  Past Medical History:  Diagnosis Date  . Chronic atrial fibrillation (HCC)    a. on Couamdin; b. CHADS2VASc at least 7 (CHF, HTN, age x 2, DM, stroke x 2); c. s/p MAZE 1999  . Chronic systolic CHF (congestive heart failure) (HCC)    a. echo 08/01/2015: EF of 45%, mild LVH, mitral valve with annular calcification, mitral valve partially mobile  . CKD (chronic kidney disease), stage III (HCC)   . COPD (chronic obstructive pulmonary disease) (HCC)   . Diabetes mellitus without complication (HCC)   . Hypertension   . Hypothyroidism   . Legally blind   . Mitral valve prolapse    a. s/p mitral valve repair 1999  . Normal coronary arteries    a. by cardiac cath 1999  . Parkinson's disease (HCC)   . Stroke (HCC)   . TIA (transient ischemic attack)   . UTI (lower urinary tract infection)     Patient Active Problem List   Diagnosis Date Noted  . Chronic diastolic heart failure (HCC) 05/14/2017  . Pressure injury of skin 03/16/2017  . Dehydration 03/21/2016  . Hypotension 03/21/2016  . Fall 03/21/2016  . Chest wall contusion, left, initial encounter 03/21/2016  . Hypoxia 03/21/2016  . Generalized weakness 03/21/2016  . Leukocytosis 03/21/2016  . Acute on chronic renal failure (HCC) 03/19/2016  . HTN (hypertension)  09/25/2015  . Parkinson's disease (HCC) 09/25/2015  . Hypothyroidism 09/25/2015  . COPD exacerbation (HCC) 09/12/2015  . Diabetes (HCC) 08/20/2015  . Chronic atrial fibrillation (HCC)   . Sinus pause   . Sick sinus syndrome (HCC)   . Lower extremity weakness 02/25/2015    Past Surgical History:  Procedure Laterality Date  . CARDIAC CATHETERIZATION N/A 08/08/2015   Procedure: Temporary Pacemaker;  Surgeon: Peter M SwazilandJordan, MD;  [facility];  Service: Cardiovascular;  Laterality: N/A;  . CARDIAC SURGERY    . CHOLECYSTECTOMY    . EP IMPLANTABLE DEVICE N/A 08/08/2015   Procedure: Pacemaker Implant;  Surgeon: Will Jorja LoaMartin Camnitz, MD;  Location: [facility] INVAS[facility] CV LAB;  Service: Cardiovascular;  Laterality: N/A;    Prior to Admission medications   Medication Sig Start Date End Date Taking? Authorizing Provider  albuterol (PROVENTIL HFA;VENTOLIN HFA) 108 (90 Base) MCG/ACT inhaler Inhale 2 puffs every 4 (four) hours as needed into the lungs for wheezing or shortness of breath.    [provider]  albuterol (PROVENTIL) (2.5 MG/3ML) 0.083% nebulizer solution Take 3 mLs (2.5 mg total) by nebulization every 6 (six) hours as needed for wheezing or shortness of breath. 05/13/16   Katha HammingKonidena, Snehalatha, MD  amiodarone (PACERONE) 200 MG tablet Take 1 tablet (200 mg total) 2 (two) times daily by mouth. 03/18/17   Shaune Pollackhen, Qing, MD  apixaban Everlene Balls(ELIQUIS) 2.5 MG  TABS tablet Take 2.5 mg 2 (two) times daily by mouth.    [provider]  budesonide (PULMICORT) 0.5 MG/2ML nebulizer solution Take 2 mLs (0.5 mg total) 2 (two) times daily by nebulization. 03/18/17   Shaune Pollack, MD  calcium carbonate (TUMS - DOSED IN MG ELEMENTAL CALCIUM) 500 MG chewable tablet Chew 1 tablet (200 mg of elemental calcium total) 2 (two) times daily by mouth. 03/18/17   Shaune Pollack, MD  carbidopa-levodopa (SINEMET IR) 25-250 MG tablet Take 1 tablet by mouth 4 (four) times daily.     [provider]    cetirizine (ZYRTEC) 10 MG tablet Take 10 mg by mouth daily.    [provider]  famotidine (PEPCID) 40 MG tablet Take 1 tablet (40 mg total) daily by mouth. 03/18/17   Shaune Pollack, MD  feeding supplement, ENSURE ENLIVE, (ENSURE ENLIVE) LIQD Take 237 mLs 3 (three) times daily between meals by mouth. Patient not taking: Reported on 05/14/2017 03/18/17   Shaune Pollack, MD  fluticasone-salmeterol (ADVAIR [facility]) [phone] MCG/ACT inhaler Inhale 2 puffs into the lungs 2 (two) times daily. 12/11/15   Alford Highland, MD  furosemide (LASIX) 40 MG tablet Take 1 tablet (40 mg total) by mouth 2 (two) times daily. Hold if SBP<110 or DBP<65 04/30/17   Shaune Pollack, MD  gabapentin (NEURONTIN) 100 MG capsule Take 100 mg 3 (three) times daily by mouth.    [provider]  glipiZIDE (GLUCOTROL) 10 MG tablet Take 10 mg by mouth daily before breakfast.    [provider]  insulin aspart (NOVOLOG) 100 UNIT/ML injection Inject 0-5 Units into the skin at bedtime. 05/13/16   Katha Hamming, MD  multivitamin-lutein [facility]) CAPS capsule Take 1 capsule daily by mouth. 03/18/17   Shaune Pollack, MD  potassium chloride (MICRO-K) 10 MEQ CR capsule Take 10 mEq by mouth daily.    [provider]  tamsulosin (FLOMAX) 0.4 MG CAPS capsule Take 1 capsule (0.4 mg total) daily by mouth. 03/18/17   Shaune Pollack, MD     Allergies Patient has no known allergies.  Family History  Problem Relation Age of Onset  . Hypertension Mother   . Stroke Mother   . CAD Father   . Lymphoma Sister   . Lung disease Brother     Social History Social History   Tobacco Use  . Smoking status: Former Smoker    Packs/day: 4.00    Years: 20.00    Pack years: 80.00    Types: Cigarettes  . Smokeless tobacco: Never Used  Substance Use Topics  . Alcohol use: No    Alcohol/week: 0.0 oz  . Drug use: No    Review of Systems  Constitutional: No dizziness  ENT: Bleeding from mouth   Gastrointestinal: No  abdominal pain.  No nausea, no vomiting.     Skin: Negative for pallor Neurological: Negative for headaches     ____________________________________________   PHYSICAL EXAM:  VITAL SIGNS: ED Triage Vitals  Enc Vitals Group     BP 07/20/17 1020 113/74     Pulse Rate 07/20/17 1020 77     Resp 07/20/17 1020 18     Temp 07/20/17 1020 98.2 F (36.8 C)     Temp Source 07/20/17 1020 Oral     SpO2 07/20/17 1020 96 %     Weight 07/20/17 1017 117 kg (258 lb)     Height 07/20/17 1017 1.753 m (5\' 9" )     Head Circumference --  Peak Flow --      Pain Score --      Pain Loc --      Pain Edu? --      Excl. in GC? --     Constitutional: Alert and oriented. No acute distress.  Eyes: Conjunctivae are normal.  Head: Atraumatic. Nose: No epistaxis Mouth/Throat: Blood along the gums both superiorly and inferiorly with pieces of cashew noted.  Does not appear to be actively bleeding at this time.  No laceration or bleeding from the tongue, pharynx is normal, after rinsing patient's mouth several times, no active bleeding, site of prior bleeding appears to be where tooth 8 would be located if he had one, there is a stable clot there Cardiovascular: Normal rate,  Respiratory: Normal respiratory effort.  No retractions.   Neurologic:  Normal speech and language. No gross focal neurologic deficits are appreciated.   Skin:  Skin is warm, dry and intact. No rash noted.   ____________________________________________   LABS (all labs ordered are listed, but only abnormal results are displayed)  Labs Reviewed - No data to display ____________________________________________  EKG   ____________________________________________  RADIOLOGY  None ____________________________________________   PROCEDURES  Procedure(s) performed: No  Procedures   Critical Care performed: No ____________________________________________   INITIAL IMPRESSION / ASSESSMENT AND PLAN / ED  COURSE  Pertinent labs & imaging results that were available during my care of the patient were reviewed by me and considered in my medical decision making (see chart for details).  Bleeding had stopped by the time the patient arrived secondary to biting down on gauze.  Clot noted as detailed above, patient observed in the emergency department, no further bleeding.  Appropriate for discharge, if any bleeding bite down on gauze as detailed.   ____________________________________________   FINAL CLINICAL IMPRESSION(S) / ED DIAGNOSES  Final diagnoses:  Gums, bleeding      NEW MEDICATIONS STARTED DURING THIS VISIT:  New Prescriptions   No medications on file     Note:  This document was prepared using Dragon voice recognition software and may include unintentional dictation errors.    Jene Every, MD 07/20/17 1208

## 2017-08-12 ENCOUNTER — Ambulatory Visit: Payer: Medicare Other | Admitting: Family

## 2017-10-15 IMAGING — CR DG CHEST 2V
1 series · 2 of 2 positions shown · non-contrast
Comparison: Chest, abdomen and pelvis CT scan and single view of
the chest 02/25/2015.

CLINICAL DATA: Shortness breath since 07/08/2015.

EXAM:
CHEST  2 VIEW

[Series 1: dg chest 2 view · 0.14mm/px · 2 of 2 slices shown]
[im 1/2]
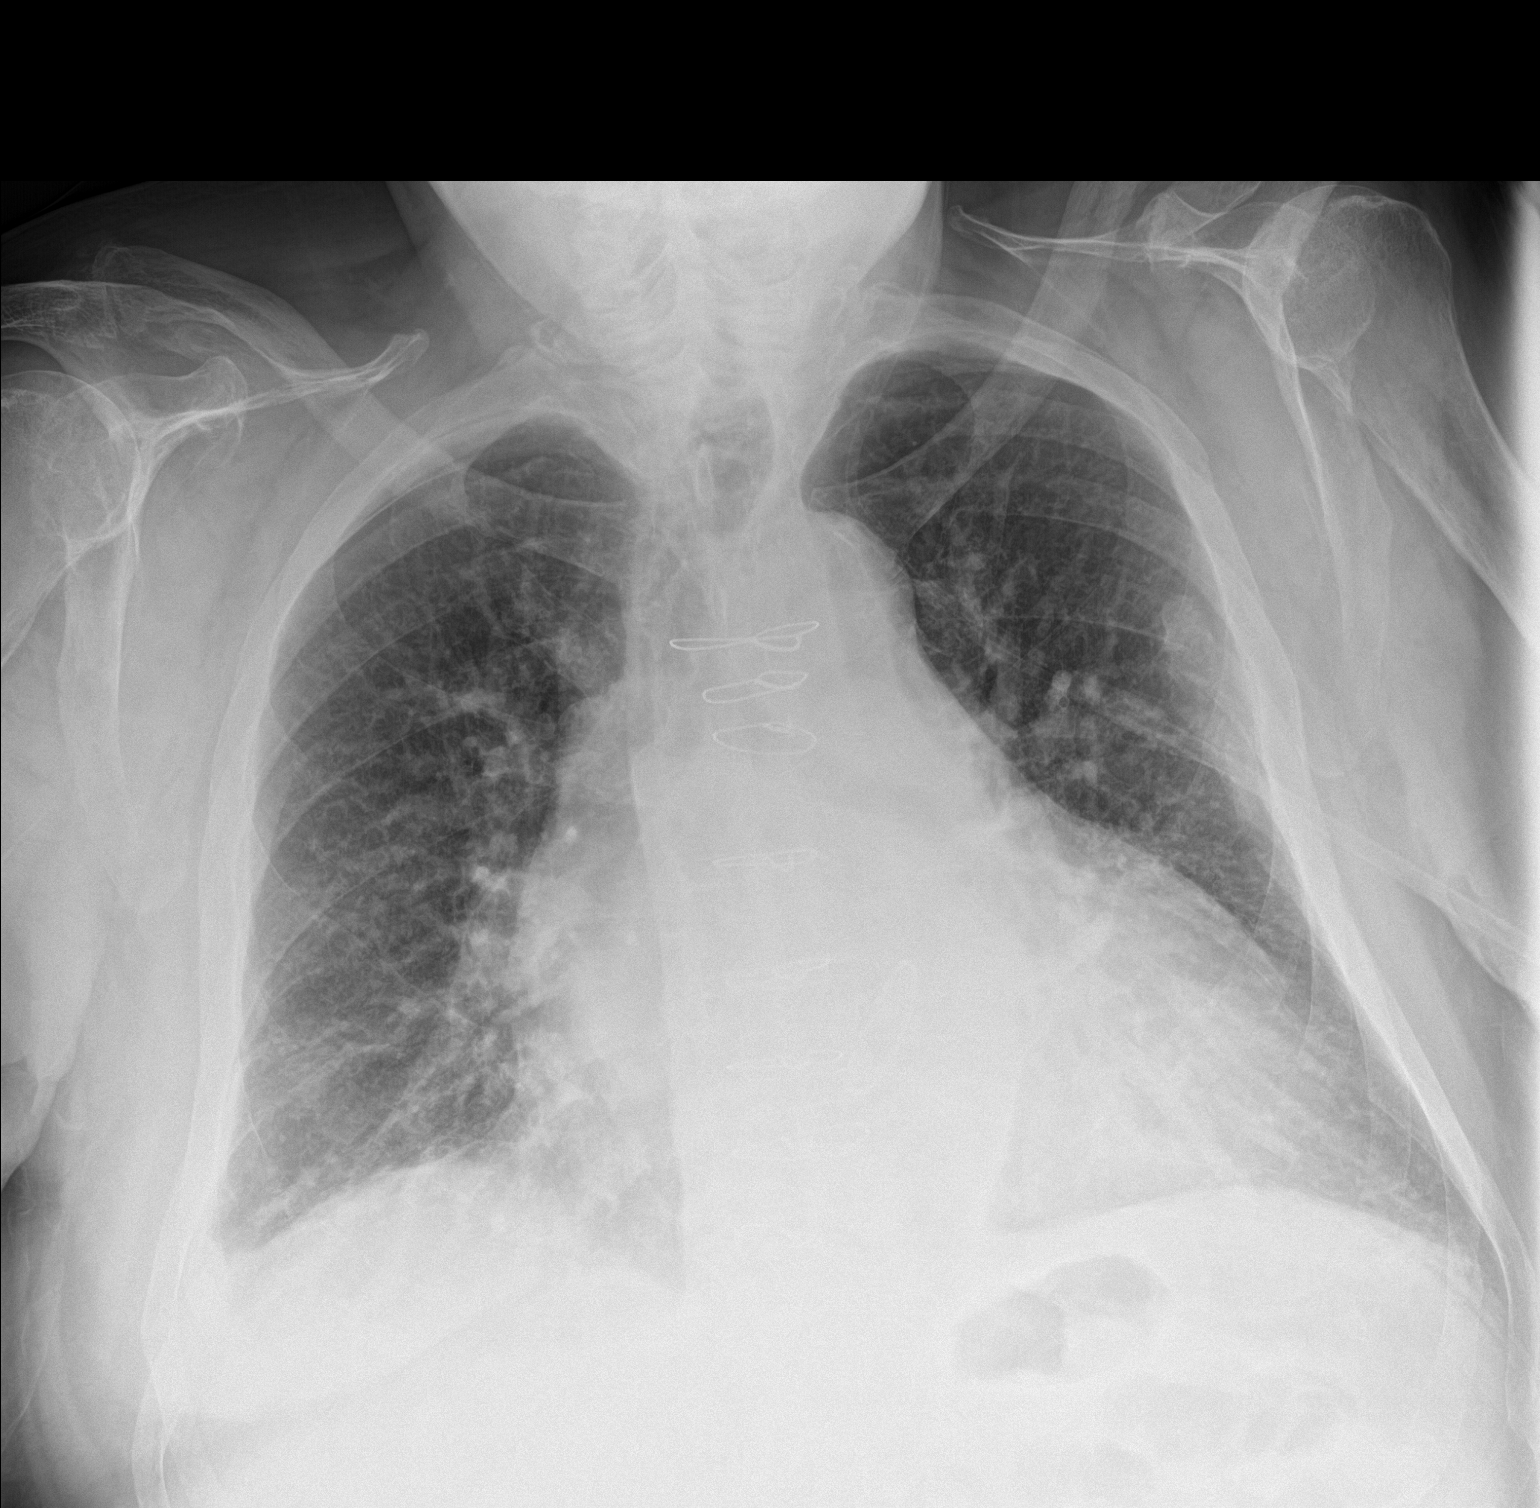
[im 2/2]
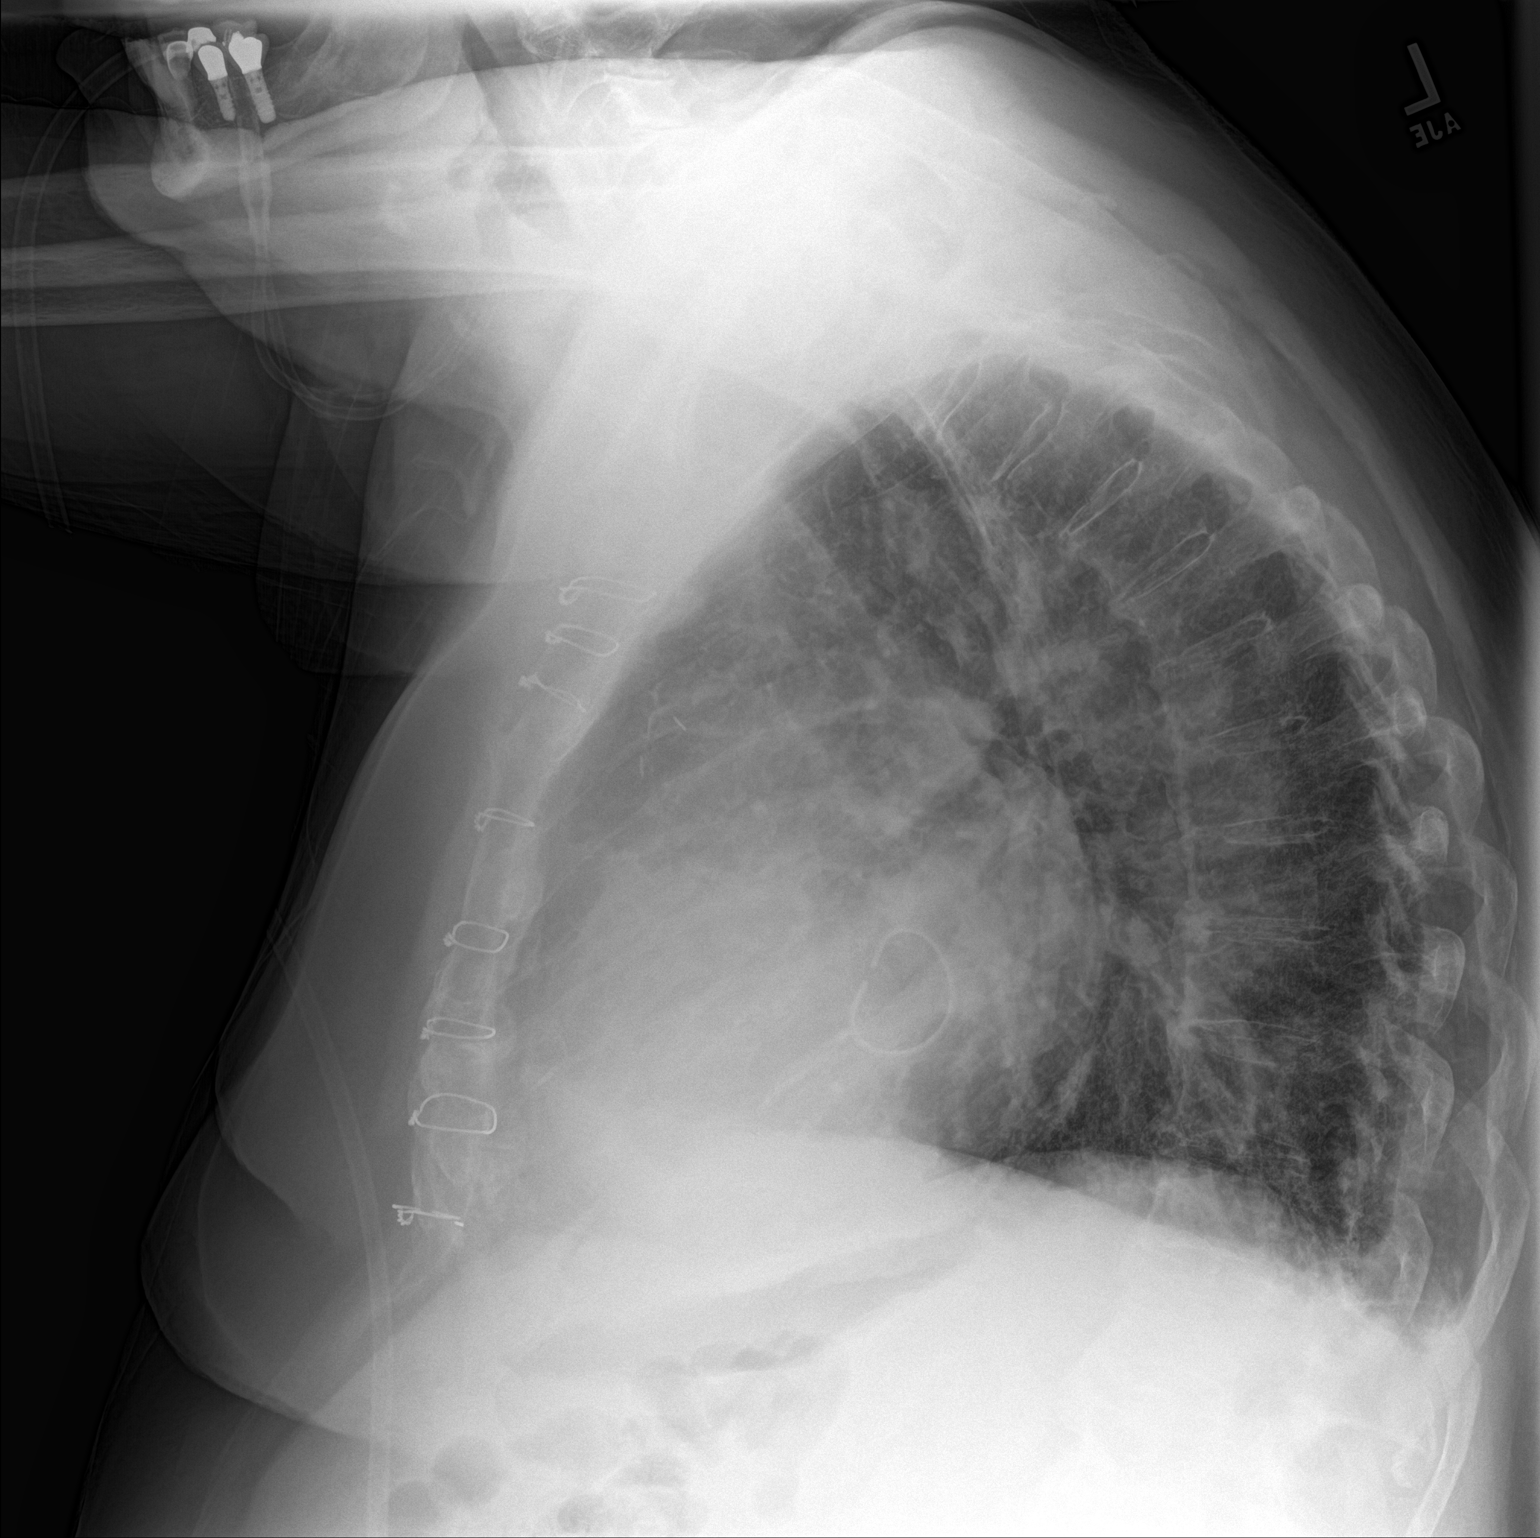

[2 of 2 positions shown; findings below may reference images not displayed]

FINDINGS: There is cardiomegaly and mild vascular congestion. Trace right
pleural effusion is noted. No left pleural effusion pneumothorax. No
consolidative process. Remote left rib fractures noted.
IMPRESSION: Cardiomegaly and pulmonary vascular congestion.

Trace right pleural effusion.

## 2017-11-11 IMAGING — CT CT HEAD W/O CM
2 of 3 series · 16 of 30 positions shown, 18 images · non-contrast
Comparison: 03/14/2014, 02/25/2015

CLINICAL DATA: Unresponsive.

EXAM:
CT HEAD WITHOUT CONTRAST
TECHNIQUE: Contiguous axial images were obtained from the base of the skull
through the vertex without intravenous contrast.

[Series 2: head wo · axial · 0.40mm/px · z∈[-71,+49]mm · 8 of 32 slices shown, 10 images]
[im 4/32  brain]
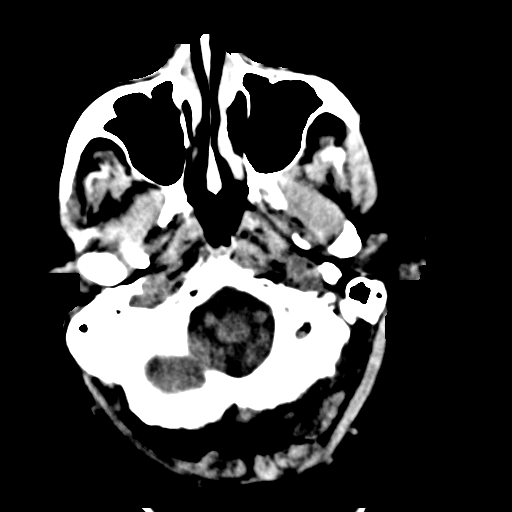
[im 4/32  bone]
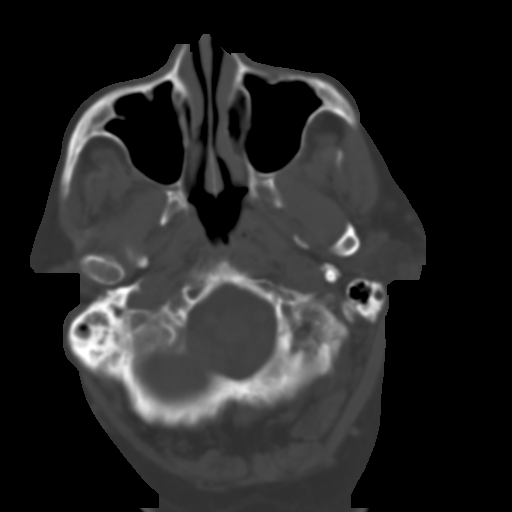
[im 7/32  brain]
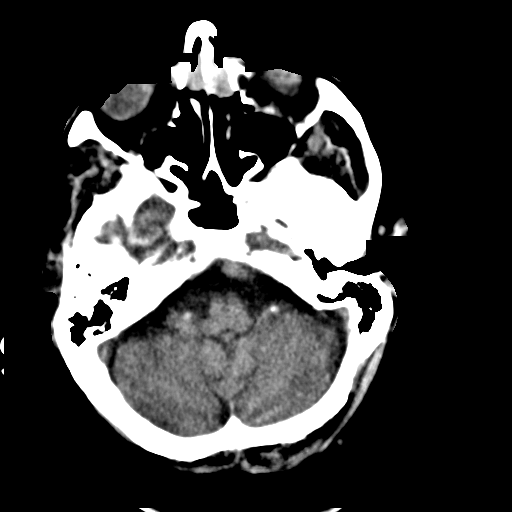
[im 11/32  brain]
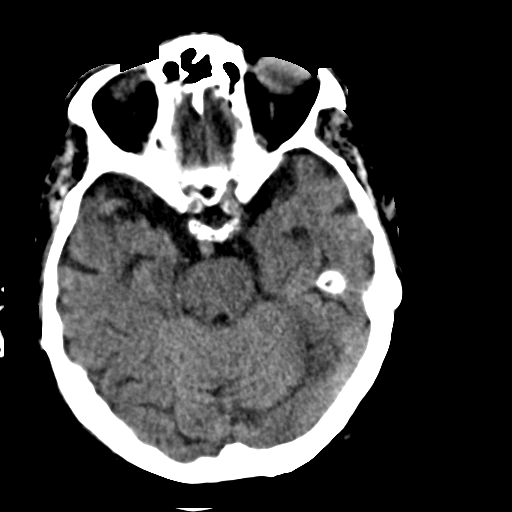
[im 14/32  brain]
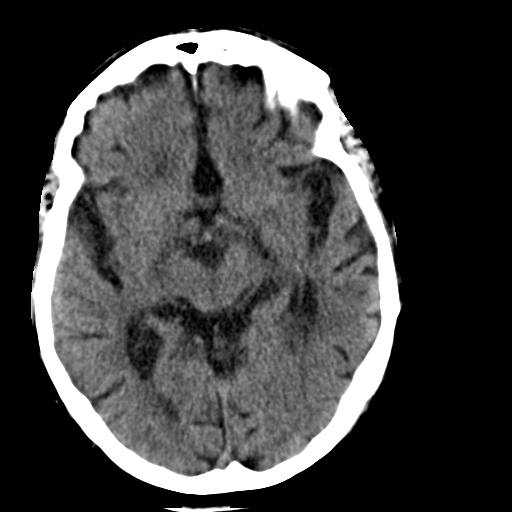
[im 18/32  brain]
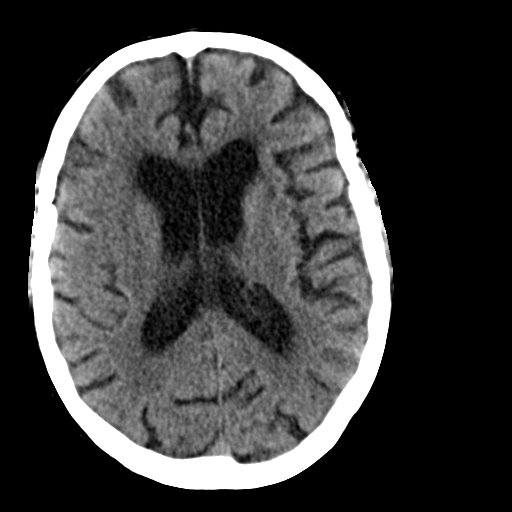
[im 18/32  bone]
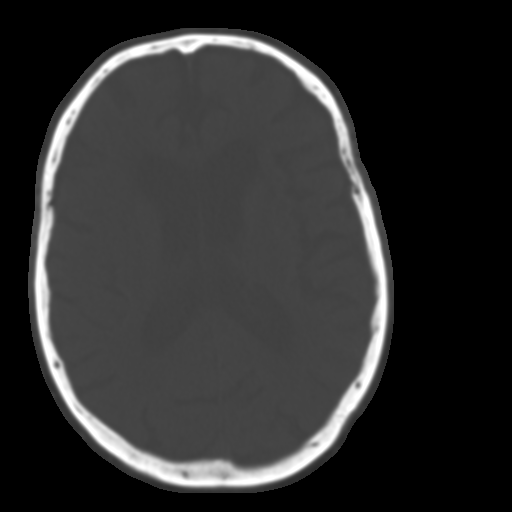
[im 21/32  brain]
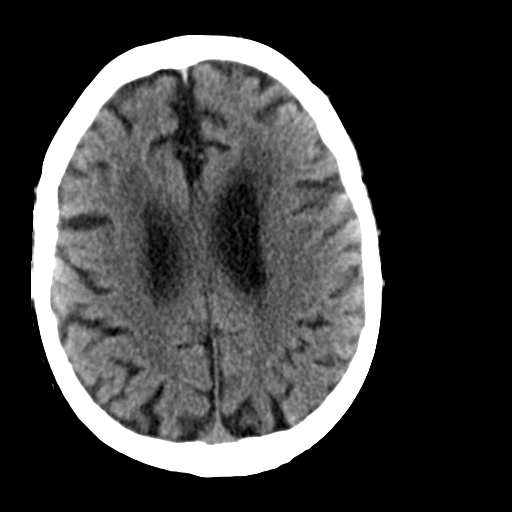
[im 25/32  brain]
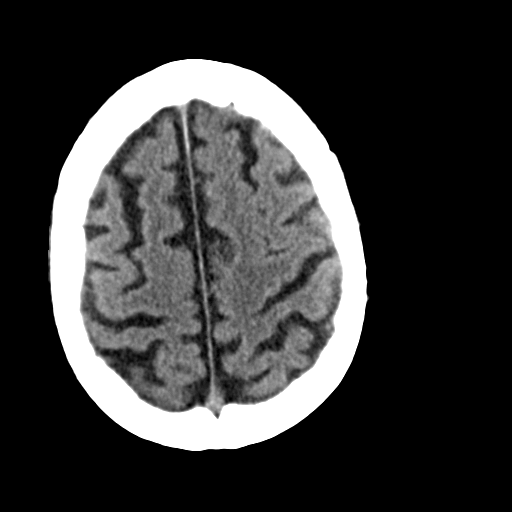
[im 28/32  brain]
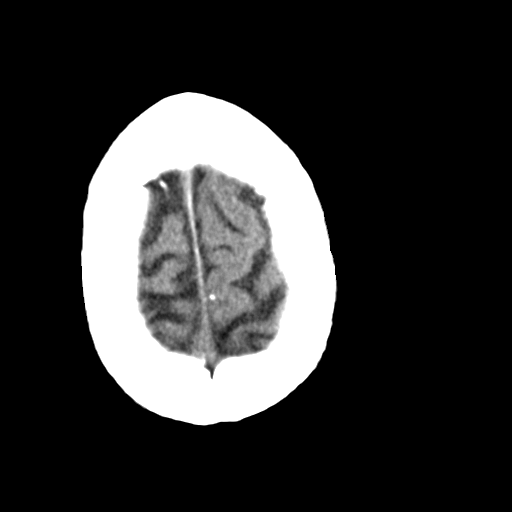

[Series 4: head wo- · axial · 0.34mm/px · z∈[-31,+80]mm · 8 of 32 slices shown]
[im 4/32  brain]
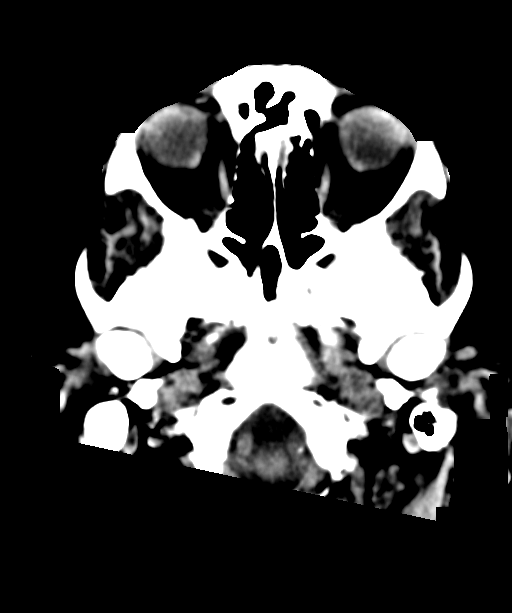
[im 7/32  brain]
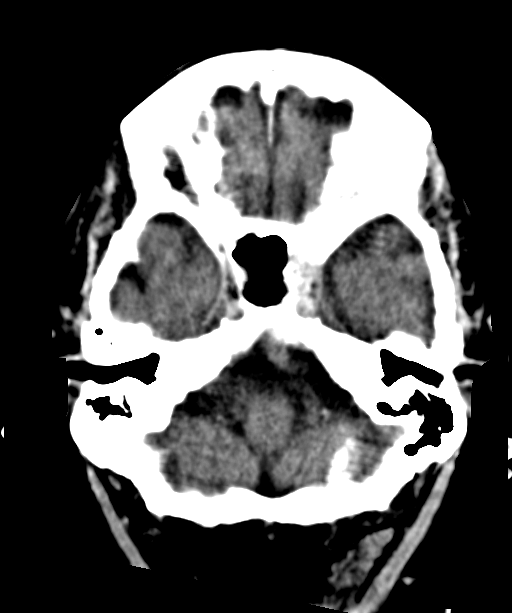
[im 11/32  brain]
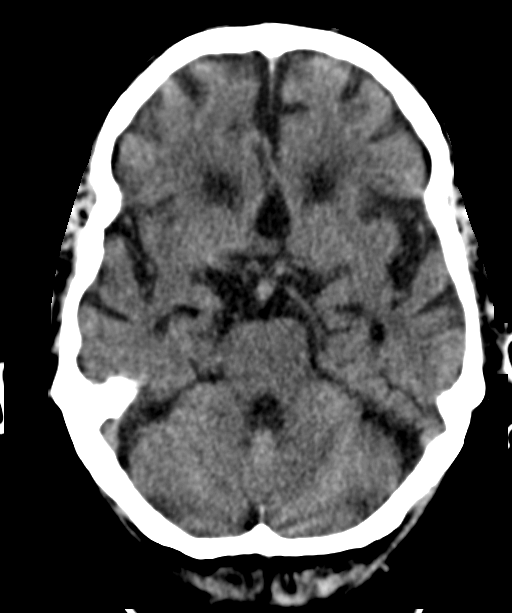
[im 14/32  brain]
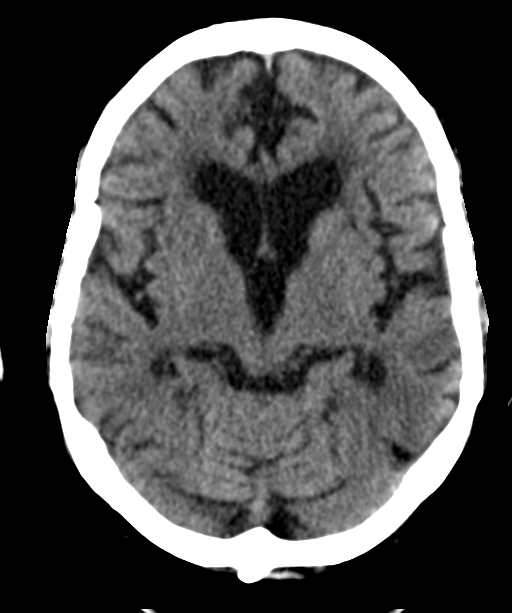
[im 18/32  brain]
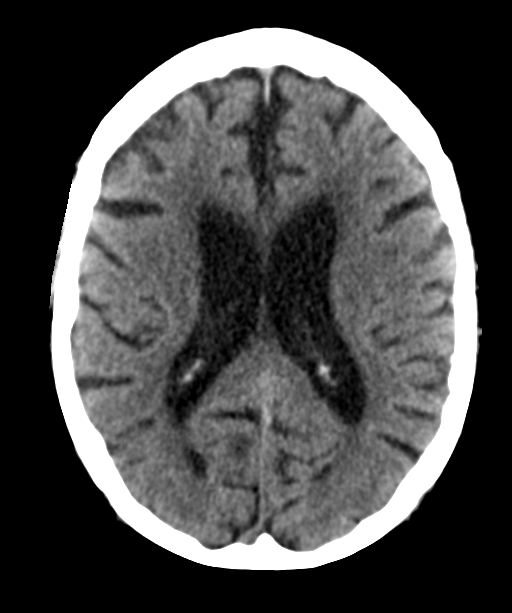
[im 21/32  brain]
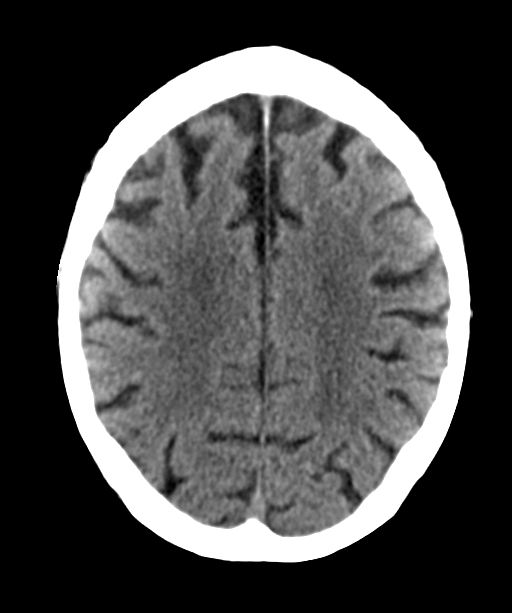
[im 25/32  brain]
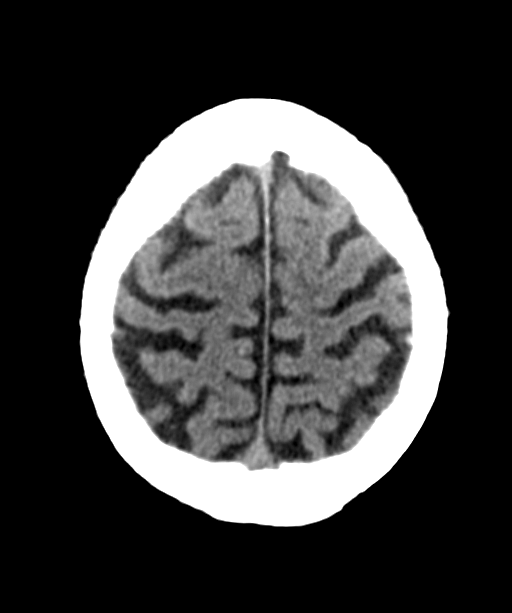
[im 28/32  brain]
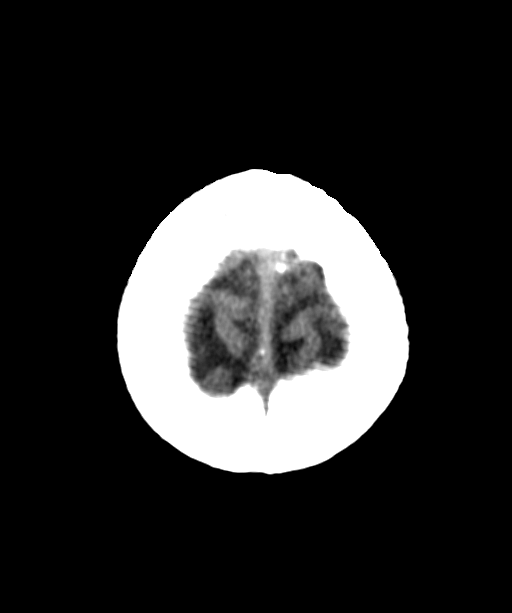

[16 of 30 positions shown; findings below may reference images not displayed]

FINDINGS: Moderate to advanced atrophy stable. Negative for hydrocephalus.
Ventricular enlargement consistent with atrophy

Chronic microvascular ischemic changes in the white matter. Negative
for acute infarct. Negative for intracranial hemorrhage. No
intracranial mass lesion.

Soft tissue mass in the anterior ethmoid sinus on both sides of the
nasal septum. The mass measures 20 x 26 mm and has been present on
prior studies with slow growth. The mass appears to penetrate
through the nasal septum. This is most consistent with malignancy.
Biopsy recommended.
IMPRESSION: Atrophy and chronic microvascular ischemia. No acute intracranial
abnormality

Soft tissue mass in the anterior ethmoid sinus consistent with
neoplasm. Biopsy recommended

## 2017-11-13 ENCOUNTER — Encounter: Payer: Self-pay | Admitting: Cardiology

## 2017-11-13 IMAGING — DX DG CHEST 2V
2 series · 2 of 2 positions shown · non-contrast
Comparison: 08/03/2015 and previous

CLINICAL DATA: Chest and arm soreness following pacemaker
placement.

EXAM:
CHEST  2 VIEW

[x chest ap]
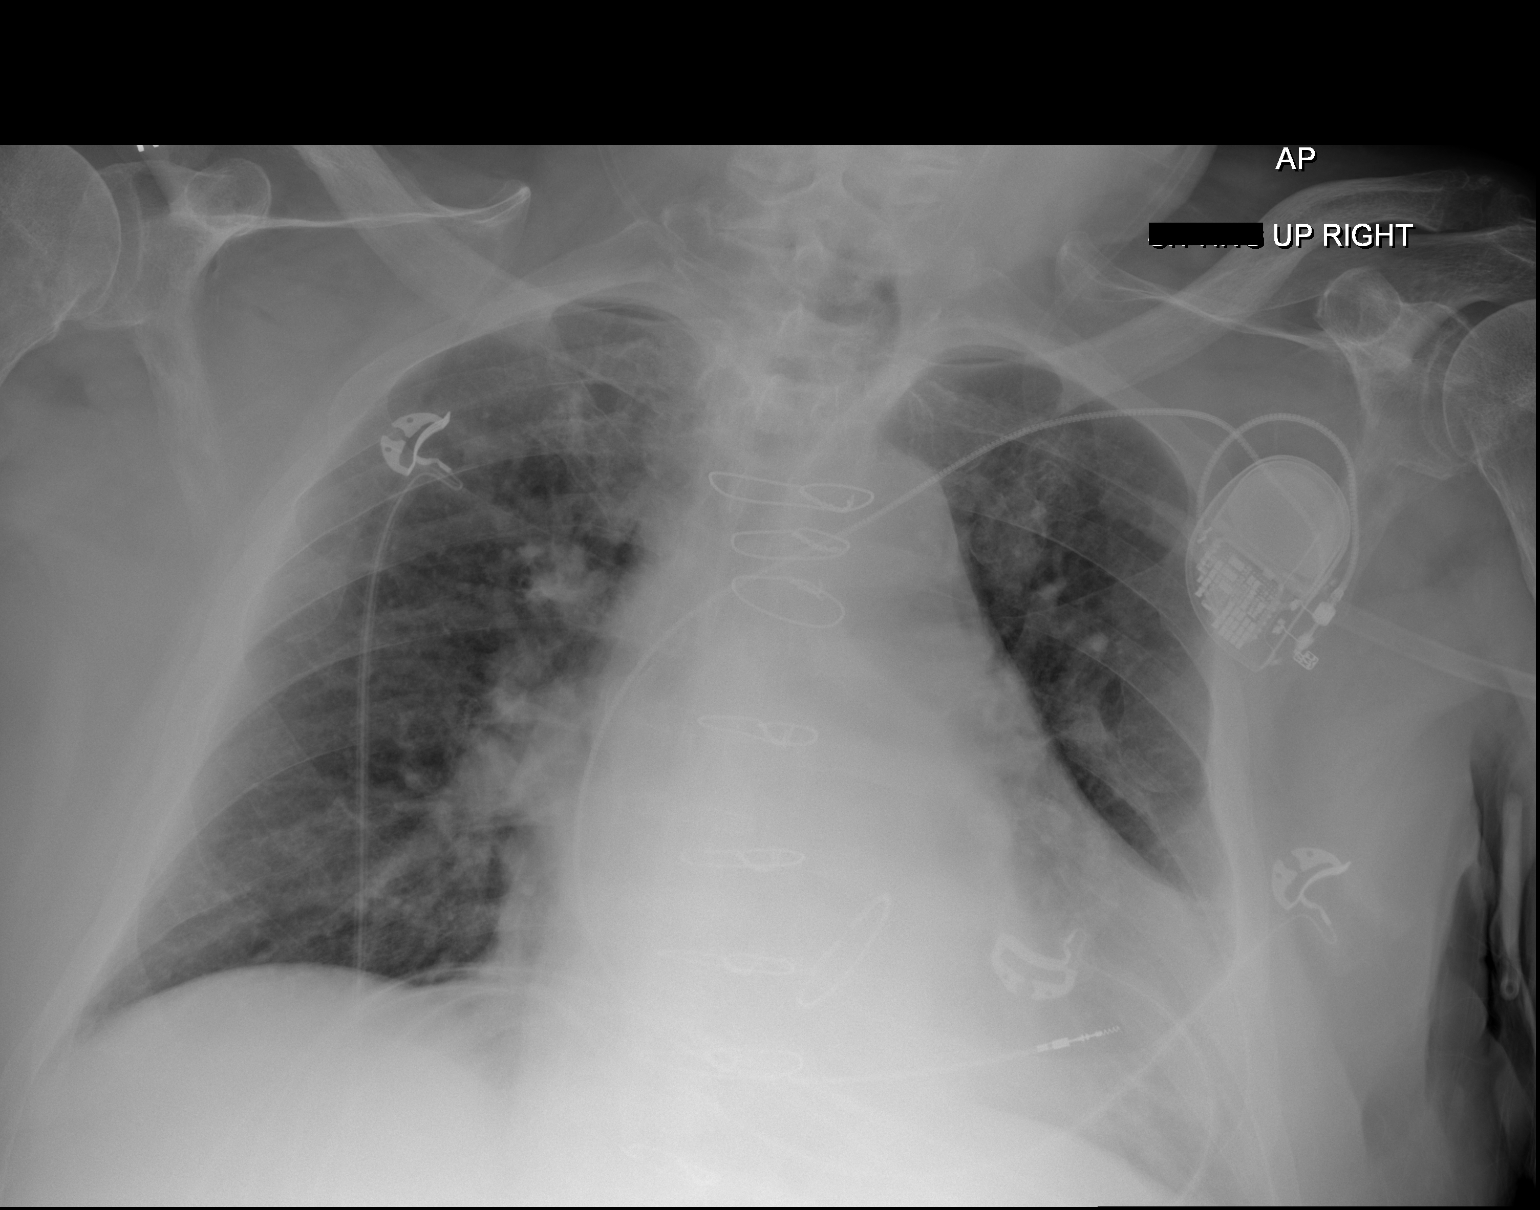

[w chest lat]
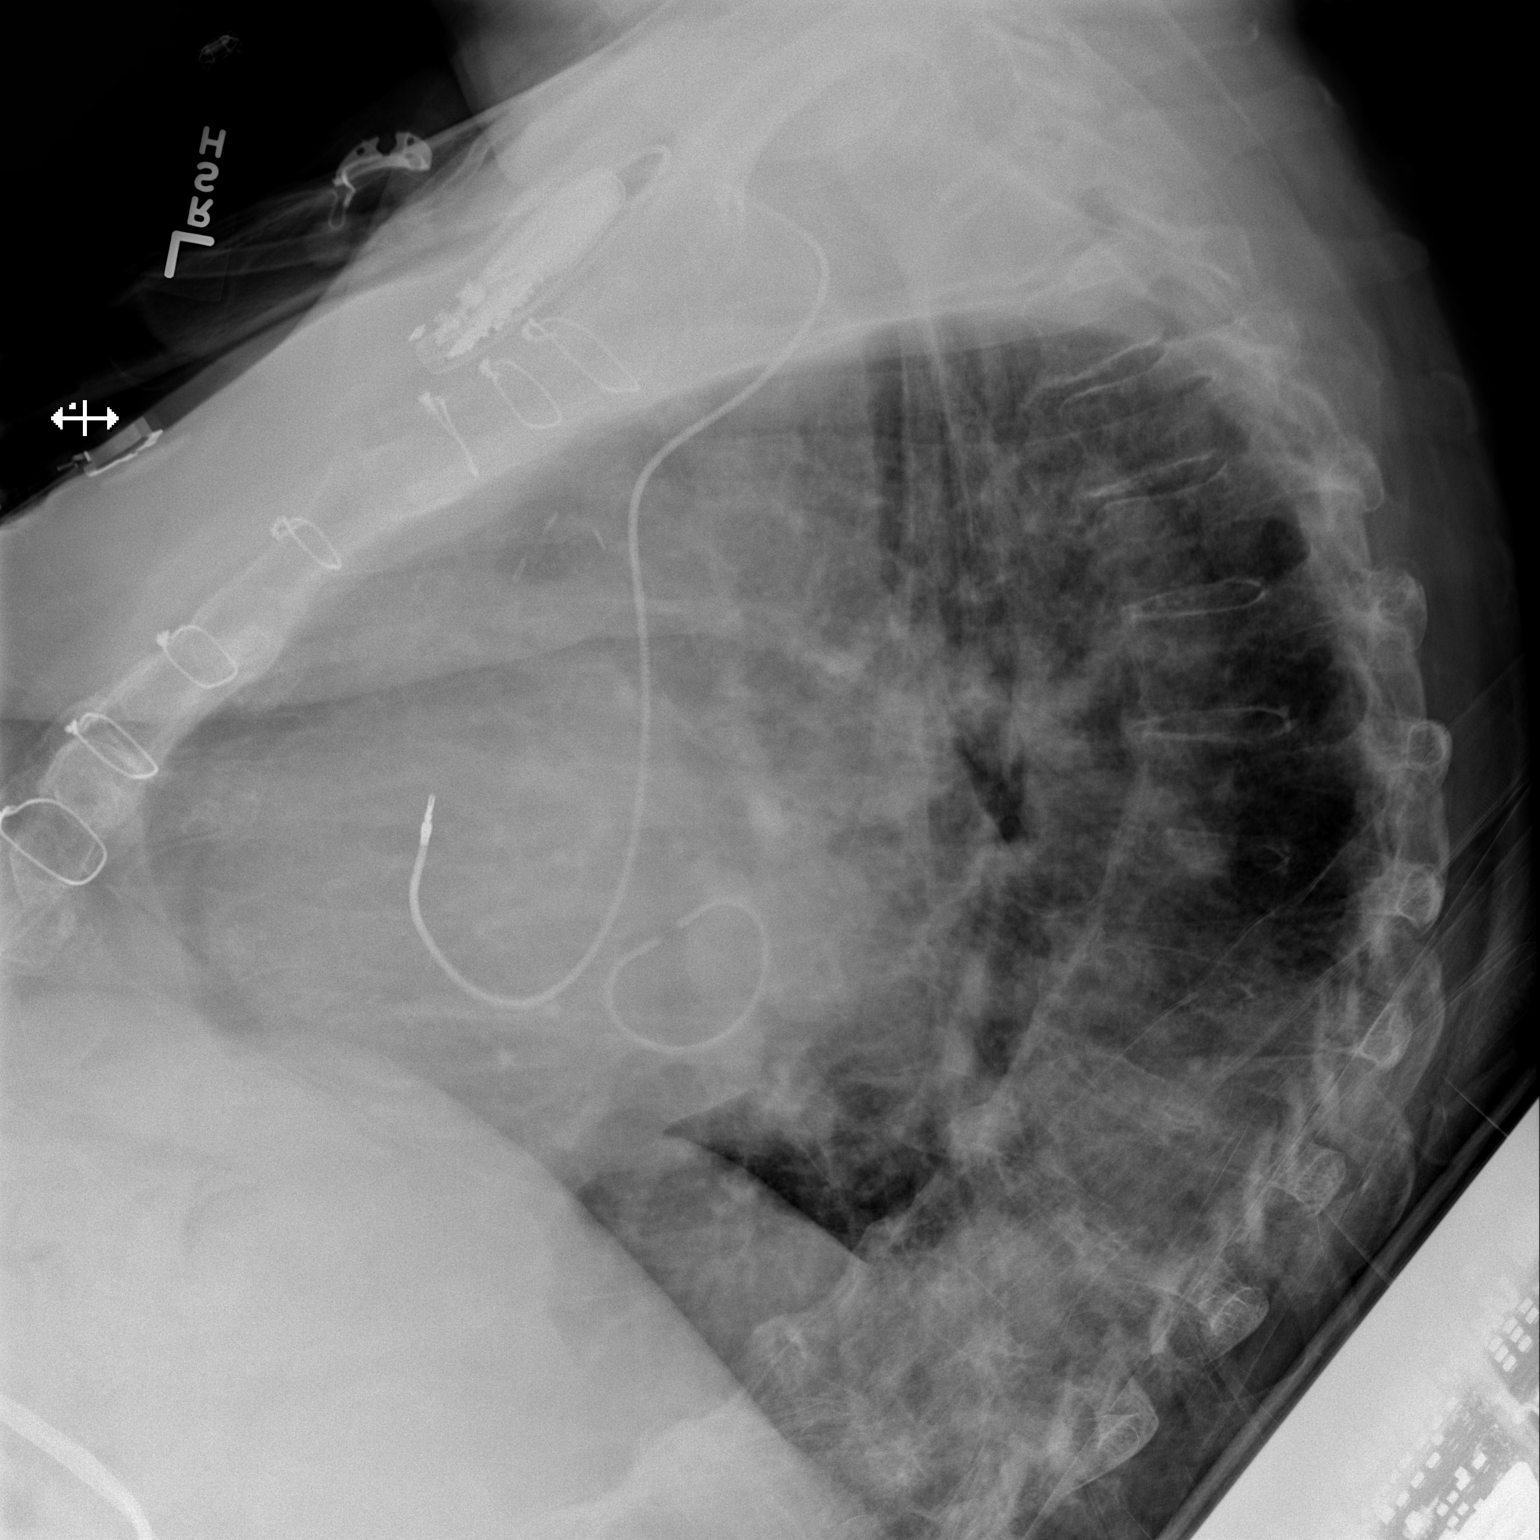

[2 of 2 positions shown; findings below may reference images not displayed]

FINDINGS: Previous median sternotomy and mitral valve replacement. Newly
placed single lead pacemaker from a left subclavian approach. Lead
is in the region of the right ventricle, though on the lateral view
the lead is more posteriorly positioned than often seen for right
ventricular location. In cases of cardiomegaly, this could still be
appropriately positioned within the right ventricular wall. No
complication seen relating to the pacemaker itself. No pneumothorax.
Cardiomegaly persists. The aorta is unfolded. There may be venous
hypertension but there is no frank edema. No infiltrate, collapse or
effusion. Ordinary degenerative change affects the spine.
IMPRESSION: No complications seen following single lead pacemaker placement. See
above description of pacemaker lead position on the lateral film.

Cardiomegaly and pulmonary venous hypertension.

## 2017-11-16 ENCOUNTER — Emergency Department
Admission: EM | Admit: 2017-11-16 | Discharge: 2017-11-16 | Disposition: A | Discharge status: Home / Self Care | Attending: Emergency Medicine | Admitting: Emergency Medicine

## 2017-11-16 ENCOUNTER — Other Ambulatory Visit: Payer: Self-pay

## 2017-11-16 ENCOUNTER — Encounter: Payer: Self-pay | Admitting: Emergency Medicine

## 2017-11-16 DIAGNOSIS — I130 Hypertensive heart and chronic kidney disease with heart failure and stage 1 through stage 4 chronic kidney disease, or unspecified chronic kidney disease: Secondary | ICD-10-CM | POA: Insufficient documentation

## 2017-11-16 DIAGNOSIS — E039 Hypothyroidism, unspecified: Secondary | ICD-10-CM | POA: Insufficient documentation

## 2017-11-16 DIAGNOSIS — N183 Chronic kidney disease, stage 3 (moderate): Secondary | ICD-10-CM | POA: Insufficient documentation

## 2017-11-16 DIAGNOSIS — Z794 Long term (current) use of insulin: Secondary | ICD-10-CM | POA: Diagnosis not present

## 2017-11-16 DIAGNOSIS — Z7901 Long term (current) use of anticoagulants: Secondary | ICD-10-CM | POA: Diagnosis not present

## 2017-11-16 DIAGNOSIS — Z87891 Personal history of nicotine dependence: Secondary | ICD-10-CM | POA: Diagnosis not present

## 2017-11-16 DIAGNOSIS — I5032 Chronic diastolic (congestive) heart failure: Secondary | ICD-10-CM | POA: Insufficient documentation

## 2017-11-16 DIAGNOSIS — Z79899 Other long term (current) drug therapy: Secondary | ICD-10-CM | POA: Insufficient documentation

## 2017-11-16 DIAGNOSIS — Z95810 Presence of automatic (implantable) cardiac defibrillator: Secondary | ICD-10-CM | POA: Insufficient documentation

## 2017-11-16 DIAGNOSIS — Z8673 Personal history of transient ischemic attack (TIA), and cerebral infarction without residual deficits: Secondary | ICD-10-CM | POA: Diagnosis not present

## 2017-11-16 DIAGNOSIS — W07XXXA Fall from chair, initial encounter: Secondary | ICD-10-CM | POA: Diagnosis not present

## 2017-11-16 DIAGNOSIS — G20 Parkinson's disease: Secondary | ICD-10-CM | POA: Insufficient documentation

## 2017-11-16 DIAGNOSIS — Y92531 Health care provider office as the place of occurrence of the external cause: Secondary | ICD-10-CM | POA: Diagnosis not present

## 2017-11-16 DIAGNOSIS — J449 Chronic obstructive pulmonary disease, unspecified: Secondary | ICD-10-CM | POA: Diagnosis not present

## 2017-11-16 DIAGNOSIS — Y998 Other external cause status: Secondary | ICD-10-CM | POA: Insufficient documentation

## 2017-11-16 DIAGNOSIS — Z9049 Acquired absence of other specified parts of digestive tract: Secondary | ICD-10-CM | POA: Insufficient documentation

## 2017-11-16 DIAGNOSIS — S81811A Laceration without foreign body, right lower leg, initial encounter: Secondary | ICD-10-CM | POA: Insufficient documentation

## 2017-11-16 DIAGNOSIS — E1122 Type 2 diabetes mellitus with diabetic chronic kidney disease: Secondary | ICD-10-CM | POA: Insufficient documentation

## 2017-11-16 DIAGNOSIS — Y9389 Activity, other specified: Secondary | ICD-10-CM | POA: Diagnosis not present

## 2017-11-16 MED ORDER — LIDOCAINE HCL (PF) 1 % IJ SOLN
INTRAMUSCULAR | Status: AC
  Administered 2017-11-16: 10 mL
  Filled 2017-11-16: qty 5

## 2017-11-16 MED ORDER — LIDOCAINE HCL (PF) 1 % IJ SOLN
10.0000 mL | Freq: Once | INTRAMUSCULAR | Status: AC
  Administered 2017-11-16: 10 mL
  Filled 2017-11-16: qty 10

## 2017-11-16 NOTE — ED Notes (Signed)
See triage note  Presents with laceration to right lower leg  Unsure what he hit his leg on  But he was at the dentist and hit his leg

## 2017-11-16 NOTE — ED Triage Notes (Signed)
Larey SeatFell getting out of dentist chair. Laceration R lower leg. Dressing over with no bleed through noted.

## 2017-11-16 NOTE — ED Provider Notes (Signed)
[facility] Emergency Department Provider Note ____________________________________________  Time seen: 1737  I have reviewed the triage vital signs and the nursing notes.  HISTORY  Chief Complaint  Laceration  HPI Troy [name] is a 84 y.o. male with the below noted past medical history, presents to the ED accompanied by his wife and adult daughter, from the dental provider.  Patient has appointment today, and apparently lost his balance as he went to transfer from the dental chair to his wheelchair.  He apparently cut his right lateral lower leg on the foot rest of his own wheelchair.  Patient presents now for further evaluation and management.  He denies any other injury related to the fall.  He also denies any preceding weakness, near-syncope, or dizziness prior to the fall.  Past Medical History:  Diagnosis Date  . Chronic atrial fibrillation (HCC)    a. on Couamdin; b. CHADS2VASc at least 7 (CHF, HTN, age x 2, DM, stroke x 2); c. s/p MAZE 1999  . Chronic systolic CHF (congestive heart failure) (HCC)    a. echo 08/01/2015: EF of 45%, mild LVH, mitral valve with annular calcification, mitral valve partially mobile  . CKD (chronic kidney disease), stage III (HCC)   . COPD (chronic obstructive pulmonary disease) (HCC)   . Diabetes mellitus without complication (HCC)   . Hypertension   . Hypothyroidism   . Legally blind   . Mitral valve prolapse    a. s/p mitral valve repair 1999  . Normal coronary arteries    a. by cardiac cath 1999  . Parkinson's disease (HCC)   . Stroke (HCC)   . TIA (transient ischemic attack)   . UTI (lower urinary tract infection)     Patient Active Problem List   Diagnosis Date Noted  . Chronic diastolic heart failure (HCC) 05/14/2017  . Pressure injury of skin 03/16/2017  . Dehydration 03/21/2016  . Hypotension 03/21/2016  . Fall 03/21/2016  . Chest wall contusion, left, initial encounter 03/21/2016  . Hypoxia 03/21/2016   . Generalized weakness 03/21/2016  . Leukocytosis 03/21/2016  . Acute on chronic renal failure (HCC) 03/19/2016  . HTN (hypertension) 09/25/2015  . Parkinson's disease (HCC) 09/25/2015  . Hypothyroidism 09/25/2015  . COPD exacerbation (HCC) 09/12/2015  . Diabetes (HCC) 08/20/2015  . Chronic atrial fibrillation (HCC)   . Sinus pause   . Sick sinus syndrome (HCC)   . Lower extremity weakness 02/25/2015    Past Surgical History:  Procedure Laterality Date  . CARDIAC CATHETERIZATION N/A 08/08/2015   Procedure: Temporary Pacemaker;  Surgeon: Peter M SwazilandJordan, MD;  [facility];  Service: Cardiovascular;  Laterality: N/A;  . CARDIAC SURGERY    . CHOLECYSTECTOMY    . EP IMPLANTABLE DEVICE N/A 08/08/2015   Procedure: Pacemaker Implant;  Surgeon: Will Jorja LoaMartin Camnitz, MD;  Location: [facility] INVAS[facility] CV LAB;  Service: Cardiovascular;  Laterality: N/A;    Prior to Admission medications   Medication Sig Start Date End Date Taking? Authorizing Provider  albuterol (PROVENTIL HFA;VENTOLIN HFA) 108 (90 Base) MCG/ACT inhaler Inhale 2 puffs every 4 (four) hours as needed into the lungs for wheezing or shortness of breath.    [provider]  albuterol (PROVENTIL) (2.5 MG/3ML) 0.083% nebulizer solution Take 3 mLs (2.5 mg total) by nebulization every 6 (six) hours as needed for wheezing or shortness of breath. 05/13/16   Katha HammingKonidena, Snehalatha, MD  amiodarone (PACERONE) 200 MG tablet Take 1 tablet (200 mg total) 2 (two) times daily by  mouth. 03/18/17   Shaune Pollack, MD  apixaban (ELIQUIS) 2.5 MG TABS tablet Take 2.5 mg 2 (two) times daily by mouth.    [provider]  budesonide (PULMICORT) 0.5 MG/2ML nebulizer solution Take 2 mLs (0.5 mg total) 2 (two) times daily by nebulization. 03/18/17   Shaune Pollack, MD  calcium carbonate (TUMS - DOSED IN MG ELEMENTAL CALCIUM) 500 MG chewable tablet Chew 1 tablet (200 mg of elemental calcium total) 2 (two) times daily by mouth. 03/18/17   Shaune Pollack,  MD  carbidopa-levodopa (SINEMET IR) 25-250 MG tablet Take 1 tablet by mouth 4 (four) times daily.     [provider]  cetirizine (ZYRTEC) 10 MG tablet Take 10 mg by mouth daily.    [provider]  famotidine (PEPCID) 40 MG tablet Take 1 tablet (40 mg total) daily by mouth. 03/18/17   Shaune Pollack, MD  feeding supplement, ENSURE ENLIVE, (ENSURE ENLIVE) LIQD Take 237 mLs 3 (three) times daily between meals by mouth. Patient not taking: Reported on 05/14/2017 03/18/17   Shaune Pollack, MD  fluticasone-salmeterol (ADVAIR [facility]) [phone] MCG/ACT inhaler Inhale 2 puffs into the lungs 2 (two) times daily. 12/11/15   Alford Highland, MD  furosemide (LASIX) 40 MG tablet Take 1 tablet (40 mg total) by mouth 2 (two) times daily. Hold if SBP<110 or DBP<65 04/30/17   Shaune Pollack, MD  gabapentin (NEURONTIN) 100 MG capsule Take 100 mg 3 (three) times daily by mouth.    [provider]  glipiZIDE (GLUCOTROL) 10 MG tablet Take 10 mg by mouth daily before breakfast.    [provider]  insulin aspart (NOVOLOG) 100 UNIT/ML injection Inject 0-5 Units into the skin at bedtime. 05/13/16   Katha Hamming, MD  multivitamin-lutein [facility]) CAPS capsule Take 1 capsule daily by mouth. 03/18/17   Shaune Pollack, MD  potassium chloride (MICRO-K) 10 MEQ CR capsule Take 10 mEq by mouth daily.    [provider]  tamsulosin (FLOMAX) 0.4 MG CAPS capsule Take 1 capsule (0.4 mg total) daily by mouth. 03/18/17   Shaune Pollack, MD    Allergies Patient has no known allergies.  Family History  Problem Relation Age of Onset  . Hypertension Mother   . Stroke Mother   . CAD Father   . Lymphoma Sister   . Lung disease Brother     Social History Social History   Tobacco Use  . Smoking status: Former Smoker    Packs/day: 4.00    Years: 20.00    Pack years: 80.00    Types: Cigarettes  . Smokeless tobacco: Never Used  Substance Use Topics  . Alcohol use: No    Alcohol/week: 0.0 oz  .  Drug use: No    Review of Systems  Constitutional: Negative for fever. Eyes: Negative for visual changes. ENT: Negative for sore throat. Cardiovascular: Negative for chest pain. Respiratory: Negative for shortness of breath. Gastrointestinal: Negative for abdominal pain, vomiting and diarrhea. Genitourinary: Negative for dysuria. Musculoskeletal: Negative for back pain. Skin: Negative for rash.  Right lower extremity laceration as above. Neurological: Negative for headaches, focal weakness or numbness. ____________________________________________  PHYSICAL EXAM:  VITAL SIGNS: ED Triage Vitals  Enc Vitals Group     BP 11/16/17 1703 121/69     Pulse Rate 11/16/17 1703 82     Resp 11/16/17 1703 18     Temp 11/16/17 1703 98.7 F (37.1 C)     Temp Source 11/16/17 1703 Oral     SpO2 11/16/17 1703 96 %  Weight 11/16/17 1704 230 lb (104.3 kg)     Height 11/16/17 1704 5\' 10"  (1.778 m)     Head Circumference --      Peak Flow --      Pain Score 11/16/17 1704 5     Pain Loc --      Pain Edu? --      Excl. in GC? --     Constitutional: Alert and oriented. Well appearing and in no distress. Head: Normocephalic and atraumatic. Eyes: Conjunctivae are normal. Normal extraocular movements Cardiovascular: Normal rate, regular rhythm. Normal distal pulses. Respiratory: Normal respiratory effort. No wheezes/rales/rhonchi. Musculoskeletal: Right knee without any obvious deformity, dislocation, or joint effusion.  Patient with nontender palpation to the bony prominences around the right knee.  No calf or Achilles tenderness is appreciated.  Laceration to the lateral right leg is noted.  There is no active bleeding associated with the laceration that extends to the subcutaneous tissue.  Nontender with normal range of motion in all extremities.  Neurologic:  Normal gait without ataxia. Normal speech and language. No gross focal neurologic deficits are appreciated. Skin:  Skin is warm, dry  and intact. No rash noted. ____________________________________________  PROCEDURES  .Marland KitchenLaceration Repair Date/Time: 11/16/2017 5:49 PM Performed by: Lissa Hoard, PA-C Authorized by: Lissa Hoard, PA-C   Consent:    Consent obtained:  Verbal   Consent given by:  Patient   Risks discussed:  Poor wound healing and pain Anesthesia (see MAR for exact dosages):    Anesthesia method:  Local infiltration   Local anesthetic:  Lidocaine 1% w/o epi Laceration details:    Location:  Leg   Leg location:  R lower leg   Length (cm):  10 Repair type:    Repair type:  Simple Pre-procedure details:    Preparation:  Patient was prepped and draped in usual sterile fashion Exploration:    Hemostasis achieved with:  Direct pressure   Contaminated: no   Treatment:    Area cleansed with:  Betadine   Amount of cleaning:  Standard   Irrigation solution:  Sterile saline   Irrigation method:  Syringe Skin repair:    Repair method:  Sutures   Suture size:  3-0   Suture material:  Nylon   Suture technique:  Horizontal mattress   Number of sutures:  9 (2- interrupted; 7 mattress) Approximation:    Approximation:  Close Post-procedure details:    Dressing:  Non-adherent dressing   Patient tolerance of procedure:  Tolerated well, no immediate complications  ____________________________________________  INITIAL IMPRESSION / ASSESSMENT AND PLAN / ED COURSE  Geriatric patient with ED evaluation of injury sustained following a mechanical fall.  Patient sustained a laceration of the right lower leg.  Wound is repaired with sutures with good wound edge approximation.  Patient and his family are discharged with wound care instructions.  They will follow-up with Dr. Marcello Fennel in 10 to 12 days for suture removal.  Patient should rest with the leg elevated as needed. ____________________________________________  FINAL CLINICAL IMPRESSION(S) / ED DIAGNOSES  Final diagnoses:  Laceration  of right lower extremity, initial encounter      Lissa Hoard, PA-C 11/16/17 1901    Don Perking [place], MD 11/16/17 2159

## 2017-11-16 NOTE — Discharge Instructions (Addendum)
Keep the wound clean, dry, and covered. Rest with the foot elevated as seated. See your provider or a local urgent care center in 10-12 days for suture removal. There are 2 interrupted sutures on the ends; and 7 horizontal mattress sutures between.

## 2017-12-22 ENCOUNTER — Encounter: Admitting: Internal Medicine

## 2018-02-02 DEATH — deceased
